# Patient Record
Sex: Male | Born: 1935 | Race: White | Hispanic: No | Smoking: Former smoker
Health system: Southern US, Community
[De-identification: ages and names within clinical notes are randomized; demographics above are authoritative.]

## PROBLEM LIST (undated history)

## (undated) DIAGNOSIS — L0390 Cellulitis, unspecified: Secondary | ICD-10-CM

## (undated) DIAGNOSIS — N429 Disorder of prostate, unspecified: Secondary | ICD-10-CM

## (undated) DIAGNOSIS — I509 Heart failure, unspecified: Secondary | ICD-10-CM

## (undated) DIAGNOSIS — K649 Unspecified hemorrhoids: Secondary | ICD-10-CM

## (undated) DIAGNOSIS — I4891 Unspecified atrial fibrillation: Secondary | ICD-10-CM

## (undated) DIAGNOSIS — I10 Essential (primary) hypertension: Secondary | ICD-10-CM

## (undated) HISTORY — DX: Heart failure, unspecified: I50.9

## (undated) HISTORY — PX: HEMORRHOID SURGERY: SHX153

---

## 2002-05-27 ENCOUNTER — Ambulatory Visit (HOSPITAL_COMMUNITY): Admission: RE | Admit: 2002-05-27 | Discharge: 2002-05-27 | Payer: Self-pay | Admitting: Gastroenterology

## 2002-07-10 ENCOUNTER — Encounter: Payer: Self-pay | Admitting: General Surgery

## 2002-07-15 ENCOUNTER — Ambulatory Visit (HOSPITAL_COMMUNITY): Admission: RE | Admit: 2002-07-15 | Discharge: 2002-07-15 | Payer: Self-pay | Admitting: General Surgery

## 2002-08-27 ENCOUNTER — Ambulatory Visit (HOSPITAL_COMMUNITY): Admission: RE | Admit: 2002-08-27 | Discharge: 2002-08-27 | Payer: Self-pay | Admitting: General Surgery

## 2004-04-18 ENCOUNTER — Encounter: Admission: RE | Admit: 2004-04-18 | Discharge: 2004-04-18 | Payer: Self-pay | Admitting: Internal Medicine

## 2011-08-15 ENCOUNTER — Encounter (INDEPENDENT_AMBULATORY_CARE_PROVIDER_SITE_OTHER): Payer: MEDICARE | Admitting: Ophthalmology

## 2011-08-15 DIAGNOSIS — H43819 Vitreous degeneration, unspecified eye: Secondary | ICD-10-CM

## 2011-08-15 DIAGNOSIS — H348192 Central retinal vein occlusion, unspecified eye, stable: Secondary | ICD-10-CM

## 2011-08-15 DIAGNOSIS — H35039 Hypertensive retinopathy, unspecified eye: Secondary | ICD-10-CM

## 2011-09-12 ENCOUNTER — Encounter (INDEPENDENT_AMBULATORY_CARE_PROVIDER_SITE_OTHER): Payer: MEDICARE | Admitting: Ophthalmology

## 2011-09-12 DIAGNOSIS — H348192 Central retinal vein occlusion, unspecified eye, stable: Secondary | ICD-10-CM

## 2011-09-12 DIAGNOSIS — H35039 Hypertensive retinopathy, unspecified eye: Secondary | ICD-10-CM

## 2011-09-12 DIAGNOSIS — H43819 Vitreous degeneration, unspecified eye: Secondary | ICD-10-CM

## 2011-10-10 ENCOUNTER — Encounter (INDEPENDENT_AMBULATORY_CARE_PROVIDER_SITE_OTHER): Payer: MEDICARE | Admitting: Ophthalmology

## 2011-10-10 DIAGNOSIS — H35039 Hypertensive retinopathy, unspecified eye: Secondary | ICD-10-CM

## 2011-10-10 DIAGNOSIS — H348192 Central retinal vein occlusion, unspecified eye, stable: Secondary | ICD-10-CM

## 2011-10-10 DIAGNOSIS — H43819 Vitreous degeneration, unspecified eye: Secondary | ICD-10-CM

## 2011-11-19 ENCOUNTER — Encounter (INDEPENDENT_AMBULATORY_CARE_PROVIDER_SITE_OTHER): Payer: MEDICARE | Admitting: Ophthalmology

## 2011-11-19 DIAGNOSIS — H35379 Puckering of macula, unspecified eye: Secondary | ICD-10-CM

## 2011-11-19 DIAGNOSIS — H348192 Central retinal vein occlusion, unspecified eye, stable: Secondary | ICD-10-CM

## 2011-11-19 DIAGNOSIS — I10 Essential (primary) hypertension: Secondary | ICD-10-CM

## 2011-11-19 DIAGNOSIS — H35039 Hypertensive retinopathy, unspecified eye: Secondary | ICD-10-CM

## 2012-01-04 ENCOUNTER — Encounter (INDEPENDENT_AMBULATORY_CARE_PROVIDER_SITE_OTHER): Payer: MEDICARE | Admitting: Ophthalmology

## 2012-01-04 DIAGNOSIS — I10 Essential (primary) hypertension: Secondary | ICD-10-CM

## 2012-01-04 DIAGNOSIS — H35039 Hypertensive retinopathy, unspecified eye: Secondary | ICD-10-CM

## 2012-01-04 DIAGNOSIS — H43819 Vitreous degeneration, unspecified eye: Secondary | ICD-10-CM

## 2012-01-04 DIAGNOSIS — H348192 Central retinal vein occlusion, unspecified eye, stable: Secondary | ICD-10-CM

## 2012-02-18 ENCOUNTER — Encounter (INDEPENDENT_AMBULATORY_CARE_PROVIDER_SITE_OTHER): Payer: MEDICARE | Admitting: Ophthalmology

## 2012-02-18 DIAGNOSIS — H35039 Hypertensive retinopathy, unspecified eye: Secondary | ICD-10-CM

## 2012-02-18 DIAGNOSIS — I10 Essential (primary) hypertension: Secondary | ICD-10-CM

## 2012-02-18 DIAGNOSIS — H348192 Central retinal vein occlusion, unspecified eye, stable: Secondary | ICD-10-CM

## 2012-02-18 DIAGNOSIS — H43819 Vitreous degeneration, unspecified eye: Secondary | ICD-10-CM

## 2012-03-28 ENCOUNTER — Encounter (INDEPENDENT_AMBULATORY_CARE_PROVIDER_SITE_OTHER): Payer: MEDICARE | Admitting: Ophthalmology

## 2012-03-28 DIAGNOSIS — I10 Essential (primary) hypertension: Secondary | ICD-10-CM

## 2012-03-28 DIAGNOSIS — H43819 Vitreous degeneration, unspecified eye: Secondary | ICD-10-CM

## 2012-03-28 DIAGNOSIS — H348192 Central retinal vein occlusion, unspecified eye, stable: Secondary | ICD-10-CM

## 2012-03-28 DIAGNOSIS — H35039 Hypertensive retinopathy, unspecified eye: Secondary | ICD-10-CM

## 2012-05-07 ENCOUNTER — Encounter (INDEPENDENT_AMBULATORY_CARE_PROVIDER_SITE_OTHER): Payer: MEDICARE | Admitting: Ophthalmology

## 2012-05-07 DIAGNOSIS — H43819 Vitreous degeneration, unspecified eye: Secondary | ICD-10-CM

## 2012-05-07 DIAGNOSIS — H35039 Hypertensive retinopathy, unspecified eye: Secondary | ICD-10-CM

## 2012-05-07 DIAGNOSIS — H348192 Central retinal vein occlusion, unspecified eye, stable: Secondary | ICD-10-CM

## 2012-05-07 DIAGNOSIS — I10 Essential (primary) hypertension: Secondary | ICD-10-CM

## 2012-06-18 ENCOUNTER — Encounter (INDEPENDENT_AMBULATORY_CARE_PROVIDER_SITE_OTHER): Payer: MEDICARE | Admitting: Ophthalmology

## 2012-06-18 DIAGNOSIS — I10 Essential (primary) hypertension: Secondary | ICD-10-CM

## 2012-06-18 DIAGNOSIS — H35039 Hypertensive retinopathy, unspecified eye: Secondary | ICD-10-CM

## 2012-06-18 DIAGNOSIS — H348192 Central retinal vein occlusion, unspecified eye, stable: Secondary | ICD-10-CM

## 2012-06-18 DIAGNOSIS — H43819 Vitreous degeneration, unspecified eye: Secondary | ICD-10-CM

## 2012-07-16 ENCOUNTER — Encounter (INDEPENDENT_AMBULATORY_CARE_PROVIDER_SITE_OTHER): Payer: Medicare Other | Admitting: Ophthalmology

## 2012-07-16 DIAGNOSIS — H43819 Vitreous degeneration, unspecified eye: Secondary | ICD-10-CM

## 2012-07-16 DIAGNOSIS — H35039 Hypertensive retinopathy, unspecified eye: Secondary | ICD-10-CM

## 2012-07-16 DIAGNOSIS — I10 Essential (primary) hypertension: Secondary | ICD-10-CM

## 2012-07-16 DIAGNOSIS — H348192 Central retinal vein occlusion, unspecified eye, stable: Secondary | ICD-10-CM

## 2012-08-13 ENCOUNTER — Encounter (INDEPENDENT_AMBULATORY_CARE_PROVIDER_SITE_OTHER): Payer: Medicare Other | Admitting: Ophthalmology

## 2012-08-21 ENCOUNTER — Encounter (INDEPENDENT_AMBULATORY_CARE_PROVIDER_SITE_OTHER): Payer: Medicare Other | Admitting: Ophthalmology

## 2012-08-21 DIAGNOSIS — I10 Essential (primary) hypertension: Secondary | ICD-10-CM

## 2012-08-21 DIAGNOSIS — H348192 Central retinal vein occlusion, unspecified eye, stable: Secondary | ICD-10-CM

## 2012-08-21 DIAGNOSIS — H43819 Vitreous degeneration, unspecified eye: Secondary | ICD-10-CM

## 2012-08-21 DIAGNOSIS — H35039 Hypertensive retinopathy, unspecified eye: Secondary | ICD-10-CM

## 2012-09-18 ENCOUNTER — Encounter (INDEPENDENT_AMBULATORY_CARE_PROVIDER_SITE_OTHER): Payer: Medicare Other | Admitting: Ophthalmology

## 2012-09-18 DIAGNOSIS — I10 Essential (primary) hypertension: Secondary | ICD-10-CM

## 2012-09-18 DIAGNOSIS — H35039 Hypertensive retinopathy, unspecified eye: Secondary | ICD-10-CM

## 2012-09-18 DIAGNOSIS — H43819 Vitreous degeneration, unspecified eye: Secondary | ICD-10-CM

## 2012-09-18 DIAGNOSIS — H35379 Puckering of macula, unspecified eye: Secondary | ICD-10-CM

## 2012-09-18 DIAGNOSIS — H3530 Unspecified macular degeneration: Secondary | ICD-10-CM

## 2012-09-18 DIAGNOSIS — H348192 Central retinal vein occlusion, unspecified eye, stable: Secondary | ICD-10-CM

## 2012-10-15 ENCOUNTER — Encounter (INDEPENDENT_AMBULATORY_CARE_PROVIDER_SITE_OTHER): Payer: Medicare Other | Admitting: Ophthalmology

## 2012-10-15 DIAGNOSIS — H3530 Unspecified macular degeneration: Secondary | ICD-10-CM

## 2012-10-15 DIAGNOSIS — H43819 Vitreous degeneration, unspecified eye: Secondary | ICD-10-CM

## 2012-10-15 DIAGNOSIS — H35039 Hypertensive retinopathy, unspecified eye: Secondary | ICD-10-CM

## 2012-10-15 DIAGNOSIS — H348192 Central retinal vein occlusion, unspecified eye, stable: Secondary | ICD-10-CM

## 2012-10-15 DIAGNOSIS — I10 Essential (primary) hypertension: Secondary | ICD-10-CM

## 2012-11-21 ENCOUNTER — Encounter (INDEPENDENT_AMBULATORY_CARE_PROVIDER_SITE_OTHER): Payer: Medicare Other | Admitting: Ophthalmology

## 2012-11-21 DIAGNOSIS — H43819 Vitreous degeneration, unspecified eye: Secondary | ICD-10-CM

## 2012-11-21 DIAGNOSIS — H348192 Central retinal vein occlusion, unspecified eye, stable: Secondary | ICD-10-CM

## 2012-11-21 DIAGNOSIS — H35039 Hypertensive retinopathy, unspecified eye: Secondary | ICD-10-CM

## 2012-11-21 DIAGNOSIS — I10 Essential (primary) hypertension: Secondary | ICD-10-CM

## 2013-01-02 ENCOUNTER — Encounter (INDEPENDENT_AMBULATORY_CARE_PROVIDER_SITE_OTHER): Payer: Medicare Other | Admitting: Ophthalmology

## 2013-01-02 DIAGNOSIS — I10 Essential (primary) hypertension: Secondary | ICD-10-CM

## 2013-01-02 DIAGNOSIS — H35039 Hypertensive retinopathy, unspecified eye: Secondary | ICD-10-CM

## 2013-01-02 DIAGNOSIS — H348192 Central retinal vein occlusion, unspecified eye, stable: Secondary | ICD-10-CM

## 2013-01-02 DIAGNOSIS — H43819 Vitreous degeneration, unspecified eye: Secondary | ICD-10-CM

## 2013-02-20 ENCOUNTER — Encounter (INDEPENDENT_AMBULATORY_CARE_PROVIDER_SITE_OTHER): Payer: Medicare Other | Admitting: Ophthalmology

## 2013-02-20 DIAGNOSIS — H348192 Central retinal vein occlusion, unspecified eye, stable: Secondary | ICD-10-CM

## 2013-02-20 DIAGNOSIS — H35039 Hypertensive retinopathy, unspecified eye: Secondary | ICD-10-CM

## 2013-02-20 DIAGNOSIS — H15849 Scleral ectasia, unspecified eye: Secondary | ICD-10-CM

## 2013-02-20 DIAGNOSIS — I10 Essential (primary) hypertension: Secondary | ICD-10-CM

## 2013-04-09 ENCOUNTER — Encounter (INDEPENDENT_AMBULATORY_CARE_PROVIDER_SITE_OTHER): Payer: Medicare Other | Admitting: Ophthalmology

## 2013-04-09 DIAGNOSIS — H35039 Hypertensive retinopathy, unspecified eye: Secondary | ICD-10-CM

## 2013-04-09 DIAGNOSIS — H43819 Vitreous degeneration, unspecified eye: Secondary | ICD-10-CM

## 2013-04-09 DIAGNOSIS — H35379 Puckering of macula, unspecified eye: Secondary | ICD-10-CM

## 2013-04-09 DIAGNOSIS — H348192 Central retinal vein occlusion, unspecified eye, stable: Secondary | ICD-10-CM

## 2013-04-09 DIAGNOSIS — I10 Essential (primary) hypertension: Secondary | ICD-10-CM

## 2013-05-26 ENCOUNTER — Encounter (INDEPENDENT_AMBULATORY_CARE_PROVIDER_SITE_OTHER): Payer: Medicare Other | Admitting: Ophthalmology

## 2013-05-26 DIAGNOSIS — H35039 Hypertensive retinopathy, unspecified eye: Secondary | ICD-10-CM

## 2013-05-26 DIAGNOSIS — I10 Essential (primary) hypertension: Secondary | ICD-10-CM

## 2013-05-26 DIAGNOSIS — H43819 Vitreous degeneration, unspecified eye: Secondary | ICD-10-CM

## 2013-05-26 DIAGNOSIS — H348192 Central retinal vein occlusion, unspecified eye, stable: Secondary | ICD-10-CM

## 2013-06-30 ENCOUNTER — Ambulatory Visit (HOSPITAL_COMMUNITY)
Admission: RE | Admit: 2013-06-30 | Discharge: 2013-06-30 | Disposition: A | Discharge status: Home / Self Care | Payer: Medicare Other | Source: Ambulatory Visit | Attending: Family Medicine | Admitting: Family Medicine

## 2013-06-30 ENCOUNTER — Other Ambulatory Visit (HOSPITAL_COMMUNITY): Payer: Self-pay | Admitting: Family Medicine

## 2013-06-30 DIAGNOSIS — R937 Abnormal findings on diagnostic imaging of other parts of musculoskeletal system: Secondary | ICD-10-CM | POA: Insufficient documentation

## 2013-06-30 DIAGNOSIS — M545 Low back pain, unspecified: Secondary | ICD-10-CM | POA: Insufficient documentation

## 2013-06-30 DIAGNOSIS — IMO0002 Reserved for concepts with insufficient information to code with codable children: Secondary | ICD-10-CM

## 2013-07-09 ENCOUNTER — Encounter (INDEPENDENT_AMBULATORY_CARE_PROVIDER_SITE_OTHER): Payer: Medicare Other | Admitting: Ophthalmology

## 2013-07-09 DIAGNOSIS — H35039 Hypertensive retinopathy, unspecified eye: Secondary | ICD-10-CM

## 2013-07-09 DIAGNOSIS — H348192 Central retinal vein occlusion, unspecified eye, stable: Secondary | ICD-10-CM

## 2013-07-09 DIAGNOSIS — H43819 Vitreous degeneration, unspecified eye: Secondary | ICD-10-CM

## 2013-07-09 DIAGNOSIS — H3530 Unspecified macular degeneration: Secondary | ICD-10-CM

## 2013-07-09 DIAGNOSIS — I10 Essential (primary) hypertension: Secondary | ICD-10-CM

## 2013-08-06 ENCOUNTER — Encounter (INDEPENDENT_AMBULATORY_CARE_PROVIDER_SITE_OTHER): Payer: Medicare Other | Admitting: Ophthalmology

## 2013-08-06 DIAGNOSIS — H348192 Central retinal vein occlusion, unspecified eye, stable: Secondary | ICD-10-CM

## 2013-08-06 DIAGNOSIS — I10 Essential (primary) hypertension: Secondary | ICD-10-CM

## 2013-08-06 DIAGNOSIS — H35039 Hypertensive retinopathy, unspecified eye: Secondary | ICD-10-CM

## 2013-08-06 DIAGNOSIS — H43819 Vitreous degeneration, unspecified eye: Secondary | ICD-10-CM

## 2013-09-09 ENCOUNTER — Encounter (INDEPENDENT_AMBULATORY_CARE_PROVIDER_SITE_OTHER): Payer: Medicare Other | Admitting: Ophthalmology

## 2013-09-09 DIAGNOSIS — I10 Essential (primary) hypertension: Secondary | ICD-10-CM

## 2013-09-09 DIAGNOSIS — H35039 Hypertensive retinopathy, unspecified eye: Secondary | ICD-10-CM

## 2013-09-09 DIAGNOSIS — H43819 Vitreous degeneration, unspecified eye: Secondary | ICD-10-CM

## 2013-09-09 DIAGNOSIS — H348192 Central retinal vein occlusion, unspecified eye, stable: Secondary | ICD-10-CM

## 2013-10-14 ENCOUNTER — Encounter (INDEPENDENT_AMBULATORY_CARE_PROVIDER_SITE_OTHER): Payer: Medicare Other | Admitting: Ophthalmology

## 2013-10-14 DIAGNOSIS — I10 Essential (primary) hypertension: Secondary | ICD-10-CM

## 2013-10-14 DIAGNOSIS — H35039 Hypertensive retinopathy, unspecified eye: Secondary | ICD-10-CM

## 2013-10-14 DIAGNOSIS — H348192 Central retinal vein occlusion, unspecified eye, stable: Secondary | ICD-10-CM

## 2013-10-14 DIAGNOSIS — H43819 Vitreous degeneration, unspecified eye: Secondary | ICD-10-CM

## 2013-11-20 ENCOUNTER — Encounter (INDEPENDENT_AMBULATORY_CARE_PROVIDER_SITE_OTHER): Payer: Medicare Other | Admitting: Ophthalmology

## 2013-11-20 DIAGNOSIS — H348192 Central retinal vein occlusion, unspecified eye, stable: Secondary | ICD-10-CM

## 2013-11-20 DIAGNOSIS — I10 Essential (primary) hypertension: Secondary | ICD-10-CM

## 2013-11-20 DIAGNOSIS — H35039 Hypertensive retinopathy, unspecified eye: Secondary | ICD-10-CM

## 2013-11-20 DIAGNOSIS — H43819 Vitreous degeneration, unspecified eye: Secondary | ICD-10-CM

## 2013-12-29 ENCOUNTER — Encounter (INDEPENDENT_AMBULATORY_CARE_PROVIDER_SITE_OTHER): Payer: Medicare Other | Admitting: Ophthalmology

## 2013-12-29 DIAGNOSIS — H35379 Puckering of macula, unspecified eye: Secondary | ICD-10-CM

## 2013-12-29 DIAGNOSIS — H35039 Hypertensive retinopathy, unspecified eye: Secondary | ICD-10-CM

## 2013-12-29 DIAGNOSIS — H3530 Unspecified macular degeneration: Secondary | ICD-10-CM

## 2013-12-29 DIAGNOSIS — H43819 Vitreous degeneration, unspecified eye: Secondary | ICD-10-CM

## 2013-12-29 DIAGNOSIS — H348192 Central retinal vein occlusion, unspecified eye, stable: Secondary | ICD-10-CM

## 2013-12-29 DIAGNOSIS — I10 Essential (primary) hypertension: Secondary | ICD-10-CM

## 2014-02-10 ENCOUNTER — Encounter (INDEPENDENT_AMBULATORY_CARE_PROVIDER_SITE_OTHER): Payer: Medicare Other | Admitting: Ophthalmology

## 2014-02-10 DIAGNOSIS — H3530 Unspecified macular degeneration: Secondary | ICD-10-CM

## 2014-02-10 DIAGNOSIS — H43819 Vitreous degeneration, unspecified eye: Secondary | ICD-10-CM

## 2014-02-10 DIAGNOSIS — I10 Essential (primary) hypertension: Secondary | ICD-10-CM

## 2014-02-10 DIAGNOSIS — H35039 Hypertensive retinopathy, unspecified eye: Secondary | ICD-10-CM

## 2014-02-10 DIAGNOSIS — H348192 Central retinal vein occlusion, unspecified eye, stable: Secondary | ICD-10-CM

## 2014-03-24 ENCOUNTER — Encounter (INDEPENDENT_AMBULATORY_CARE_PROVIDER_SITE_OTHER): Payer: Medicare Other | Admitting: Ophthalmology

## 2014-03-24 DIAGNOSIS — H3530 Unspecified macular degeneration: Secondary | ICD-10-CM

## 2014-03-24 DIAGNOSIS — H43819 Vitreous degeneration, unspecified eye: Secondary | ICD-10-CM

## 2014-03-24 DIAGNOSIS — I10 Essential (primary) hypertension: Secondary | ICD-10-CM

## 2014-03-24 DIAGNOSIS — H35039 Hypertensive retinopathy, unspecified eye: Secondary | ICD-10-CM

## 2014-03-24 DIAGNOSIS — H348192 Central retinal vein occlusion, unspecified eye, stable: Secondary | ICD-10-CM

## 2014-05-07 ENCOUNTER — Encounter (INDEPENDENT_AMBULATORY_CARE_PROVIDER_SITE_OTHER): Payer: Medicare Other | Admitting: Ophthalmology

## 2014-05-07 DIAGNOSIS — I10 Essential (primary) hypertension: Secondary | ICD-10-CM

## 2014-05-07 DIAGNOSIS — H35039 Hypertensive retinopathy, unspecified eye: Secondary | ICD-10-CM

## 2014-05-07 DIAGNOSIS — H348192 Central retinal vein occlusion, unspecified eye, stable: Secondary | ICD-10-CM

## 2014-05-07 DIAGNOSIS — H3530 Unspecified macular degeneration: Secondary | ICD-10-CM

## 2014-05-07 DIAGNOSIS — H43819 Vitreous degeneration, unspecified eye: Secondary | ICD-10-CM

## 2014-06-17 ENCOUNTER — Encounter (INDEPENDENT_AMBULATORY_CARE_PROVIDER_SITE_OTHER): Payer: Medicare Other | Admitting: Ophthalmology

## 2014-06-21 ENCOUNTER — Encounter (INDEPENDENT_AMBULATORY_CARE_PROVIDER_SITE_OTHER): Payer: Medicare Other | Admitting: Ophthalmology

## 2014-06-21 DIAGNOSIS — H35039 Hypertensive retinopathy, unspecified eye: Secondary | ICD-10-CM

## 2014-06-21 DIAGNOSIS — I10 Essential (primary) hypertension: Secondary | ICD-10-CM

## 2014-06-21 DIAGNOSIS — H348192 Central retinal vein occlusion, unspecified eye, stable: Secondary | ICD-10-CM

## 2014-06-21 DIAGNOSIS — H43819 Vitreous degeneration, unspecified eye: Secondary | ICD-10-CM

## 2014-06-21 DIAGNOSIS — H3530 Unspecified macular degeneration: Secondary | ICD-10-CM

## 2014-08-04 ENCOUNTER — Encounter (INDEPENDENT_AMBULATORY_CARE_PROVIDER_SITE_OTHER): Payer: Medicare Other | Admitting: Ophthalmology

## 2014-08-04 DIAGNOSIS — H35039 Hypertensive retinopathy, unspecified eye: Secondary | ICD-10-CM

## 2014-08-04 DIAGNOSIS — H348192 Central retinal vein occlusion, unspecified eye, stable: Secondary | ICD-10-CM

## 2014-08-04 DIAGNOSIS — H43819 Vitreous degeneration, unspecified eye: Secondary | ICD-10-CM

## 2014-08-04 DIAGNOSIS — I10 Essential (primary) hypertension: Secondary | ICD-10-CM

## 2014-08-04 DIAGNOSIS — H3530 Unspecified macular degeneration: Secondary | ICD-10-CM

## 2014-09-16 ENCOUNTER — Encounter (INDEPENDENT_AMBULATORY_CARE_PROVIDER_SITE_OTHER): Payer: Medicare Other | Admitting: Ophthalmology

## 2014-09-16 DIAGNOSIS — H3530 Unspecified macular degeneration: Secondary | ICD-10-CM

## 2014-09-16 DIAGNOSIS — H43819 Vitreous degeneration, unspecified eye: Secondary | ICD-10-CM

## 2014-09-16 DIAGNOSIS — H348192 Central retinal vein occlusion, unspecified eye, stable: Secondary | ICD-10-CM

## 2014-09-16 DIAGNOSIS — H35379 Puckering of macula, unspecified eye: Secondary | ICD-10-CM

## 2014-09-16 DIAGNOSIS — I10 Essential (primary) hypertension: Secondary | ICD-10-CM

## 2014-09-16 DIAGNOSIS — H35039 Hypertensive retinopathy, unspecified eye: Secondary | ICD-10-CM

## 2014-10-28 ENCOUNTER — Encounter (INDEPENDENT_AMBULATORY_CARE_PROVIDER_SITE_OTHER): Payer: Medicare Other | Admitting: Ophthalmology

## 2014-10-28 DIAGNOSIS — I10 Essential (primary) hypertension: Secondary | ICD-10-CM

## 2014-10-28 DIAGNOSIS — H3531 Nonexudative age-related macular degeneration: Secondary | ICD-10-CM

## 2014-10-28 DIAGNOSIS — H35033 Hypertensive retinopathy, bilateral: Secondary | ICD-10-CM

## 2014-10-28 DIAGNOSIS — H34811 Central retinal vein occlusion, right eye: Secondary | ICD-10-CM

## 2014-10-28 DIAGNOSIS — H43813 Vitreous degeneration, bilateral: Secondary | ICD-10-CM

## 2014-12-02 ENCOUNTER — Encounter (INDEPENDENT_AMBULATORY_CARE_PROVIDER_SITE_OTHER): Payer: Medicare Other | Admitting: Ophthalmology

## 2014-12-02 DIAGNOSIS — H3531 Nonexudative age-related macular degeneration: Secondary | ICD-10-CM

## 2014-12-02 DIAGNOSIS — I10 Essential (primary) hypertension: Secondary | ICD-10-CM

## 2014-12-02 DIAGNOSIS — H43813 Vitreous degeneration, bilateral: Secondary | ICD-10-CM

## 2014-12-02 DIAGNOSIS — H35033 Hypertensive retinopathy, bilateral: Secondary | ICD-10-CM

## 2014-12-02 DIAGNOSIS — H34811 Central retinal vein occlusion, right eye: Secondary | ICD-10-CM

## 2015-01-13 ENCOUNTER — Encounter (INDEPENDENT_AMBULATORY_CARE_PROVIDER_SITE_OTHER): Payer: Medicare Other | Admitting: Ophthalmology

## 2015-01-13 DIAGNOSIS — H34811 Central retinal vein occlusion, right eye: Secondary | ICD-10-CM | POA: Diagnosis not present

## 2015-01-13 DIAGNOSIS — H3531 Nonexudative age-related macular degeneration: Secondary | ICD-10-CM

## 2015-01-13 DIAGNOSIS — H35033 Hypertensive retinopathy, bilateral: Secondary | ICD-10-CM | POA: Diagnosis not present

## 2015-01-13 DIAGNOSIS — I10 Essential (primary) hypertension: Secondary | ICD-10-CM

## 2015-01-13 DIAGNOSIS — H35371 Puckering of macula, right eye: Secondary | ICD-10-CM

## 2015-02-24 ENCOUNTER — Encounter (INDEPENDENT_AMBULATORY_CARE_PROVIDER_SITE_OTHER): Payer: Medicare Other | Admitting: Ophthalmology

## 2015-03-03 ENCOUNTER — Encounter (INDEPENDENT_AMBULATORY_CARE_PROVIDER_SITE_OTHER): Payer: Medicare Other | Admitting: Ophthalmology

## 2015-03-03 DIAGNOSIS — H35371 Puckering of macula, right eye: Secondary | ICD-10-CM | POA: Diagnosis not present

## 2015-03-03 DIAGNOSIS — H3531 Nonexudative age-related macular degeneration: Secondary | ICD-10-CM | POA: Diagnosis not present

## 2015-03-03 DIAGNOSIS — H34811 Central retinal vein occlusion, right eye: Secondary | ICD-10-CM

## 2015-03-03 DIAGNOSIS — H43813 Vitreous degeneration, bilateral: Secondary | ICD-10-CM | POA: Diagnosis not present

## 2015-03-03 DIAGNOSIS — I10 Essential (primary) hypertension: Secondary | ICD-10-CM | POA: Diagnosis not present

## 2015-03-03 DIAGNOSIS — H35033 Hypertensive retinopathy, bilateral: Secondary | ICD-10-CM | POA: Diagnosis not present

## 2015-04-14 ENCOUNTER — Encounter (INDEPENDENT_AMBULATORY_CARE_PROVIDER_SITE_OTHER): Payer: Medicare Other | Admitting: Ophthalmology

## 2015-04-14 DIAGNOSIS — H3531 Nonexudative age-related macular degeneration: Secondary | ICD-10-CM

## 2015-04-14 DIAGNOSIS — H34811 Central retinal vein occlusion, right eye: Secondary | ICD-10-CM | POA: Diagnosis not present

## 2015-04-14 DIAGNOSIS — H43813 Vitreous degeneration, bilateral: Secondary | ICD-10-CM | POA: Diagnosis not present

## 2015-04-14 DIAGNOSIS — I10 Essential (primary) hypertension: Secondary | ICD-10-CM | POA: Diagnosis not present

## 2015-04-14 DIAGNOSIS — H35033 Hypertensive retinopathy, bilateral: Secondary | ICD-10-CM

## 2015-05-26 ENCOUNTER — Encounter (INDEPENDENT_AMBULATORY_CARE_PROVIDER_SITE_OTHER): Payer: Medicare Other | Admitting: Ophthalmology

## 2015-05-26 DIAGNOSIS — H35033 Hypertensive retinopathy, bilateral: Secondary | ICD-10-CM | POA: Diagnosis not present

## 2015-05-26 DIAGNOSIS — I10 Essential (primary) hypertension: Secondary | ICD-10-CM

## 2015-05-26 DIAGNOSIS — H43813 Vitreous degeneration, bilateral: Secondary | ICD-10-CM | POA: Diagnosis not present

## 2015-05-26 DIAGNOSIS — H3531 Nonexudative age-related macular degeneration: Secondary | ICD-10-CM | POA: Diagnosis not present

## 2015-05-26 DIAGNOSIS — H35371 Puckering of macula, right eye: Secondary | ICD-10-CM | POA: Diagnosis not present

## 2015-05-26 DIAGNOSIS — H34811 Central retinal vein occlusion, right eye: Secondary | ICD-10-CM | POA: Diagnosis not present

## 2015-07-07 ENCOUNTER — Encounter (INDEPENDENT_AMBULATORY_CARE_PROVIDER_SITE_OTHER): Payer: Medicare Other | Admitting: Ophthalmology

## 2015-07-07 DIAGNOSIS — H35371 Puckering of macula, right eye: Secondary | ICD-10-CM | POA: Diagnosis not present

## 2015-07-07 DIAGNOSIS — I10 Essential (primary) hypertension: Secondary | ICD-10-CM

## 2015-07-07 DIAGNOSIS — H43813 Vitreous degeneration, bilateral: Secondary | ICD-10-CM

## 2015-07-07 DIAGNOSIS — H3531 Nonexudative age-related macular degeneration: Secondary | ICD-10-CM

## 2015-07-07 DIAGNOSIS — H34811 Central retinal vein occlusion, right eye: Secondary | ICD-10-CM

## 2015-07-07 DIAGNOSIS — H35033 Hypertensive retinopathy, bilateral: Secondary | ICD-10-CM | POA: Diagnosis not present

## 2015-08-18 ENCOUNTER — Encounter (INDEPENDENT_AMBULATORY_CARE_PROVIDER_SITE_OTHER): Payer: Medicare Other | Admitting: Ophthalmology

## 2015-08-18 DIAGNOSIS — H34811 Central retinal vein occlusion, right eye: Secondary | ICD-10-CM

## 2015-08-18 DIAGNOSIS — H35033 Hypertensive retinopathy, bilateral: Secondary | ICD-10-CM | POA: Diagnosis not present

## 2015-08-18 DIAGNOSIS — I10 Essential (primary) hypertension: Secondary | ICD-10-CM | POA: Diagnosis not present

## 2015-08-18 DIAGNOSIS — H43813 Vitreous degeneration, bilateral: Secondary | ICD-10-CM | POA: Diagnosis not present

## 2015-08-18 DIAGNOSIS — H3531 Nonexudative age-related macular degeneration: Secondary | ICD-10-CM

## 2015-08-18 DIAGNOSIS — H35371 Puckering of macula, right eye: Secondary | ICD-10-CM

## 2015-09-23 ENCOUNTER — Encounter (INDEPENDENT_AMBULATORY_CARE_PROVIDER_SITE_OTHER): Payer: Medicare Other | Admitting: Ophthalmology

## 2015-09-23 DIAGNOSIS — H3531 Nonexudative age-related macular degeneration: Secondary | ICD-10-CM | POA: Diagnosis not present

## 2015-09-23 DIAGNOSIS — H43813 Vitreous degeneration, bilateral: Secondary | ICD-10-CM

## 2015-09-23 DIAGNOSIS — H34811 Central retinal vein occlusion, right eye: Secondary | ICD-10-CM

## 2015-09-23 DIAGNOSIS — H35371 Puckering of macula, right eye: Secondary | ICD-10-CM

## 2015-09-23 DIAGNOSIS — I10 Essential (primary) hypertension: Secondary | ICD-10-CM | POA: Diagnosis not present

## 2015-09-23 DIAGNOSIS — H35033 Hypertensive retinopathy, bilateral: Secondary | ICD-10-CM | POA: Diagnosis not present

## 2015-10-28 ENCOUNTER — Encounter (INDEPENDENT_AMBULATORY_CARE_PROVIDER_SITE_OTHER): Payer: Medicare Other | Admitting: Ophthalmology

## 2015-10-28 DIAGNOSIS — H353131 Nonexudative age-related macular degeneration, bilateral, early dry stage: Secondary | ICD-10-CM | POA: Diagnosis not present

## 2015-10-28 DIAGNOSIS — H34811 Central retinal vein occlusion, right eye, with macular edema: Secondary | ICD-10-CM | POA: Diagnosis not present

## 2015-10-28 DIAGNOSIS — H43813 Vitreous degeneration, bilateral: Secondary | ICD-10-CM

## 2015-10-28 DIAGNOSIS — I10 Essential (primary) hypertension: Secondary | ICD-10-CM | POA: Diagnosis not present

## 2015-10-28 DIAGNOSIS — H35373 Puckering of macula, bilateral: Secondary | ICD-10-CM | POA: Diagnosis not present

## 2015-10-28 DIAGNOSIS — H35033 Hypertensive retinopathy, bilateral: Secondary | ICD-10-CM

## 2015-12-02 ENCOUNTER — Encounter (INDEPENDENT_AMBULATORY_CARE_PROVIDER_SITE_OTHER): Payer: Medicare Other | Admitting: Ophthalmology

## 2015-12-02 DIAGNOSIS — H35033 Hypertensive retinopathy, bilateral: Secondary | ICD-10-CM | POA: Diagnosis not present

## 2015-12-02 DIAGNOSIS — H35372 Puckering of macula, left eye: Secondary | ICD-10-CM

## 2015-12-02 DIAGNOSIS — H348111 Central retinal vein occlusion, right eye, with retinal neovascularization: Secondary | ICD-10-CM

## 2015-12-02 DIAGNOSIS — I10 Essential (primary) hypertension: Secondary | ICD-10-CM | POA: Diagnosis not present

## 2015-12-02 DIAGNOSIS — H353111 Nonexudative age-related macular degeneration, right eye, early dry stage: Secondary | ICD-10-CM

## 2015-12-02 DIAGNOSIS — H353122 Nonexudative age-related macular degeneration, left eye, intermediate dry stage: Secondary | ICD-10-CM | POA: Diagnosis not present

## 2016-01-13 ENCOUNTER — Encounter (INDEPENDENT_AMBULATORY_CARE_PROVIDER_SITE_OTHER): Payer: Medicare Other | Admitting: Ophthalmology

## 2016-01-13 DIAGNOSIS — H35372 Puckering of macula, left eye: Secondary | ICD-10-CM | POA: Diagnosis not present

## 2016-01-13 DIAGNOSIS — I10 Essential (primary) hypertension: Secondary | ICD-10-CM | POA: Diagnosis not present

## 2016-01-13 DIAGNOSIS — H35033 Hypertensive retinopathy, bilateral: Secondary | ICD-10-CM

## 2016-01-13 DIAGNOSIS — H353122 Nonexudative age-related macular degeneration, left eye, intermediate dry stage: Secondary | ICD-10-CM

## 2016-01-13 DIAGNOSIS — H34811 Central retinal vein occlusion, right eye, with macular edema: Secondary | ICD-10-CM

## 2016-01-13 DIAGNOSIS — H353111 Nonexudative age-related macular degeneration, right eye, early dry stage: Secondary | ICD-10-CM | POA: Diagnosis not present

## 2016-02-17 ENCOUNTER — Encounter (INDEPENDENT_AMBULATORY_CARE_PROVIDER_SITE_OTHER): Payer: Medicare Other | Admitting: Ophthalmology

## 2016-02-17 DIAGNOSIS — H35033 Hypertensive retinopathy, bilateral: Secondary | ICD-10-CM | POA: Diagnosis not present

## 2016-02-17 DIAGNOSIS — H353132 Nonexudative age-related macular degeneration, bilateral, intermediate dry stage: Secondary | ICD-10-CM

## 2016-02-17 DIAGNOSIS — H43813 Vitreous degeneration, bilateral: Secondary | ICD-10-CM

## 2016-02-17 DIAGNOSIS — I10 Essential (primary) hypertension: Secondary | ICD-10-CM | POA: Diagnosis not present

## 2016-02-17 DIAGNOSIS — H34811 Central retinal vein occlusion, right eye, with macular edema: Secondary | ICD-10-CM | POA: Diagnosis not present

## 2016-02-17 DIAGNOSIS — H35372 Puckering of macula, left eye: Secondary | ICD-10-CM

## 2016-03-26 ENCOUNTER — Encounter (INDEPENDENT_AMBULATORY_CARE_PROVIDER_SITE_OTHER): Payer: Medicare Other | Admitting: Ophthalmology

## 2016-03-26 DIAGNOSIS — H43813 Vitreous degeneration, bilateral: Secondary | ICD-10-CM

## 2016-03-26 DIAGNOSIS — I10 Essential (primary) hypertension: Secondary | ICD-10-CM

## 2016-03-26 DIAGNOSIS — H35033 Hypertensive retinopathy, bilateral: Secondary | ICD-10-CM | POA: Diagnosis not present

## 2016-03-26 DIAGNOSIS — H35371 Puckering of macula, right eye: Secondary | ICD-10-CM

## 2016-03-26 DIAGNOSIS — H353131 Nonexudative age-related macular degeneration, bilateral, early dry stage: Secondary | ICD-10-CM | POA: Diagnosis not present

## 2016-03-26 DIAGNOSIS — H34811 Central retinal vein occlusion, right eye, with macular edema: Secondary | ICD-10-CM | POA: Diagnosis not present

## 2016-03-30 ENCOUNTER — Encounter (INDEPENDENT_AMBULATORY_CARE_PROVIDER_SITE_OTHER): Payer: Medicare Other | Admitting: Ophthalmology

## 2016-04-30 ENCOUNTER — Encounter (INDEPENDENT_AMBULATORY_CARE_PROVIDER_SITE_OTHER): Payer: Medicare Other | Admitting: Ophthalmology

## 2016-04-30 DIAGNOSIS — H353132 Nonexudative age-related macular degeneration, bilateral, intermediate dry stage: Secondary | ICD-10-CM | POA: Diagnosis not present

## 2016-04-30 DIAGNOSIS — I10 Essential (primary) hypertension: Secondary | ICD-10-CM | POA: Diagnosis not present

## 2016-04-30 DIAGNOSIS — H43813 Vitreous degeneration, bilateral: Secondary | ICD-10-CM

## 2016-04-30 DIAGNOSIS — H35033 Hypertensive retinopathy, bilateral: Secondary | ICD-10-CM

## 2016-04-30 DIAGNOSIS — H34811 Central retinal vein occlusion, right eye, with macular edema: Secondary | ICD-10-CM | POA: Diagnosis not present

## 2016-06-01 ENCOUNTER — Encounter (INDEPENDENT_AMBULATORY_CARE_PROVIDER_SITE_OTHER): Payer: Medicare Other | Admitting: Ophthalmology

## 2016-06-01 DIAGNOSIS — H35033 Hypertensive retinopathy, bilateral: Secondary | ICD-10-CM | POA: Diagnosis not present

## 2016-06-01 DIAGNOSIS — H34811 Central retinal vein occlusion, right eye, with macular edema: Secondary | ICD-10-CM

## 2016-06-01 DIAGNOSIS — H35371 Puckering of macula, right eye: Secondary | ICD-10-CM

## 2016-06-01 DIAGNOSIS — I10 Essential (primary) hypertension: Secondary | ICD-10-CM

## 2016-06-01 DIAGNOSIS — H43813 Vitreous degeneration, bilateral: Secondary | ICD-10-CM | POA: Diagnosis not present

## 2016-06-01 DIAGNOSIS — H353132 Nonexudative age-related macular degeneration, bilateral, intermediate dry stage: Secondary | ICD-10-CM | POA: Diagnosis not present

## 2016-07-06 ENCOUNTER — Encounter (INDEPENDENT_AMBULATORY_CARE_PROVIDER_SITE_OTHER): Payer: Medicare Other | Admitting: Ophthalmology

## 2016-07-06 DIAGNOSIS — H353121 Nonexudative age-related macular degeneration, left eye, early dry stage: Secondary | ICD-10-CM | POA: Diagnosis not present

## 2016-07-06 DIAGNOSIS — I10 Essential (primary) hypertension: Secondary | ICD-10-CM | POA: Diagnosis not present

## 2016-07-06 DIAGNOSIS — H34811 Central retinal vein occlusion, right eye, with macular edema: Secondary | ICD-10-CM | POA: Diagnosis not present

## 2016-07-06 DIAGNOSIS — H35033 Hypertensive retinopathy, bilateral: Secondary | ICD-10-CM

## 2016-07-06 DIAGNOSIS — H43813 Vitreous degeneration, bilateral: Secondary | ICD-10-CM | POA: Diagnosis not present

## 2016-07-16 ENCOUNTER — Encounter (INDEPENDENT_AMBULATORY_CARE_PROVIDER_SITE_OTHER): Payer: Medicare Other | Admitting: Ophthalmology

## 2016-08-17 ENCOUNTER — Encounter (INDEPENDENT_AMBULATORY_CARE_PROVIDER_SITE_OTHER): Payer: Medicare Other | Admitting: Ophthalmology

## 2016-08-20 ENCOUNTER — Encounter (INDEPENDENT_AMBULATORY_CARE_PROVIDER_SITE_OTHER): Payer: Medicare Other | Admitting: Ophthalmology

## 2016-08-20 DIAGNOSIS — H353111 Nonexudative age-related macular degeneration, right eye, early dry stage: Secondary | ICD-10-CM

## 2016-08-20 DIAGNOSIS — H35033 Hypertensive retinopathy, bilateral: Secondary | ICD-10-CM | POA: Diagnosis not present

## 2016-08-20 DIAGNOSIS — H43813 Vitreous degeneration, bilateral: Secondary | ICD-10-CM | POA: Diagnosis not present

## 2016-08-20 DIAGNOSIS — H353122 Nonexudative age-related macular degeneration, left eye, intermediate dry stage: Secondary | ICD-10-CM | POA: Diagnosis not present

## 2016-08-20 DIAGNOSIS — I10 Essential (primary) hypertension: Secondary | ICD-10-CM | POA: Diagnosis not present

## 2016-08-20 DIAGNOSIS — H34811 Central retinal vein occlusion, right eye, with macular edema: Secondary | ICD-10-CM

## 2016-10-01 ENCOUNTER — Encounter (INDEPENDENT_AMBULATORY_CARE_PROVIDER_SITE_OTHER): Payer: Medicare Other | Admitting: Ophthalmology

## 2016-10-01 DIAGNOSIS — I10 Essential (primary) hypertension: Secondary | ICD-10-CM

## 2016-10-01 DIAGNOSIS — H34811 Central retinal vein occlusion, right eye, with macular edema: Secondary | ICD-10-CM

## 2016-10-01 DIAGNOSIS — H35033 Hypertensive retinopathy, bilateral: Secondary | ICD-10-CM | POA: Diagnosis not present

## 2016-10-01 DIAGNOSIS — H35371 Puckering of macula, right eye: Secondary | ICD-10-CM

## 2016-10-01 DIAGNOSIS — H353131 Nonexudative age-related macular degeneration, bilateral, early dry stage: Secondary | ICD-10-CM | POA: Diagnosis not present

## 2016-10-01 DIAGNOSIS — H43813 Vitreous degeneration, bilateral: Secondary | ICD-10-CM

## 2016-11-12 ENCOUNTER — Encounter (INDEPENDENT_AMBULATORY_CARE_PROVIDER_SITE_OTHER): Payer: Medicare Other | Admitting: Ophthalmology

## 2016-11-12 DIAGNOSIS — H34811 Central retinal vein occlusion, right eye, with macular edema: Secondary | ICD-10-CM | POA: Diagnosis not present

## 2016-11-12 DIAGNOSIS — H35033 Hypertensive retinopathy, bilateral: Secondary | ICD-10-CM | POA: Diagnosis not present

## 2016-11-12 DIAGNOSIS — I10 Essential (primary) hypertension: Secondary | ICD-10-CM

## 2016-11-12 DIAGNOSIS — H43813 Vitreous degeneration, bilateral: Secondary | ICD-10-CM

## 2016-11-12 DIAGNOSIS — H353131 Nonexudative age-related macular degeneration, bilateral, early dry stage: Secondary | ICD-10-CM | POA: Diagnosis not present

## 2016-12-17 ENCOUNTER — Encounter (INDEPENDENT_AMBULATORY_CARE_PROVIDER_SITE_OTHER): Payer: Medicare Other | Admitting: Ophthalmology

## 2016-12-17 DIAGNOSIS — H353122 Nonexudative age-related macular degeneration, left eye, intermediate dry stage: Secondary | ICD-10-CM

## 2016-12-17 DIAGNOSIS — H35371 Puckering of macula, right eye: Secondary | ICD-10-CM | POA: Diagnosis not present

## 2016-12-17 DIAGNOSIS — H34811 Central retinal vein occlusion, right eye, with macular edema: Secondary | ICD-10-CM | POA: Diagnosis not present

## 2016-12-17 DIAGNOSIS — H35033 Hypertensive retinopathy, bilateral: Secondary | ICD-10-CM

## 2016-12-17 DIAGNOSIS — I10 Essential (primary) hypertension: Secondary | ICD-10-CM | POA: Diagnosis not present

## 2016-12-17 DIAGNOSIS — H43813 Vitreous degeneration, bilateral: Secondary | ICD-10-CM | POA: Diagnosis not present

## 2017-01-28 ENCOUNTER — Encounter (INDEPENDENT_AMBULATORY_CARE_PROVIDER_SITE_OTHER): Payer: Medicare Other | Admitting: Ophthalmology

## 2017-01-28 DIAGNOSIS — I10 Essential (primary) hypertension: Secondary | ICD-10-CM | POA: Diagnosis not present

## 2017-01-28 DIAGNOSIS — H34811 Central retinal vein occlusion, right eye, with macular edema: Secondary | ICD-10-CM

## 2017-01-28 DIAGNOSIS — H353121 Nonexudative age-related macular degeneration, left eye, early dry stage: Secondary | ICD-10-CM | POA: Diagnosis not present

## 2017-01-28 DIAGNOSIS — H35033 Hypertensive retinopathy, bilateral: Secondary | ICD-10-CM

## 2017-01-28 DIAGNOSIS — H43813 Vitreous degeneration, bilateral: Secondary | ICD-10-CM | POA: Diagnosis not present

## 2017-01-28 DIAGNOSIS — H35371 Puckering of macula, right eye: Secondary | ICD-10-CM | POA: Diagnosis not present

## 2017-03-11 ENCOUNTER — Encounter (INDEPENDENT_AMBULATORY_CARE_PROVIDER_SITE_OTHER): Payer: Medicare Other | Admitting: Ophthalmology

## 2017-03-18 ENCOUNTER — Encounter (INDEPENDENT_AMBULATORY_CARE_PROVIDER_SITE_OTHER): Payer: Medicare Other | Admitting: Ophthalmology

## 2017-03-18 DIAGNOSIS — H35371 Puckering of macula, right eye: Secondary | ICD-10-CM

## 2017-03-18 DIAGNOSIS — H43813 Vitreous degeneration, bilateral: Secondary | ICD-10-CM

## 2017-03-18 DIAGNOSIS — H34811 Central retinal vein occlusion, right eye, with macular edema: Secondary | ICD-10-CM

## 2017-03-18 DIAGNOSIS — H353122 Nonexudative age-related macular degeneration, left eye, intermediate dry stage: Secondary | ICD-10-CM

## 2017-03-18 DIAGNOSIS — I10 Essential (primary) hypertension: Secondary | ICD-10-CM | POA: Diagnosis not present

## 2017-03-18 DIAGNOSIS — H35033 Hypertensive retinopathy, bilateral: Secondary | ICD-10-CM

## 2017-05-06 ENCOUNTER — Encounter (INDEPENDENT_AMBULATORY_CARE_PROVIDER_SITE_OTHER): Payer: Medicare Other | Admitting: Ophthalmology

## 2017-05-23 ENCOUNTER — Encounter (INDEPENDENT_AMBULATORY_CARE_PROVIDER_SITE_OTHER): Payer: Medicare Other | Admitting: Ophthalmology

## 2017-05-23 DIAGNOSIS — I10 Essential (primary) hypertension: Secondary | ICD-10-CM | POA: Diagnosis not present

## 2017-05-23 DIAGNOSIS — H34811 Central retinal vein occlusion, right eye, with macular edema: Secondary | ICD-10-CM

## 2017-05-23 DIAGNOSIS — H353122 Nonexudative age-related macular degeneration, left eye, intermediate dry stage: Secondary | ICD-10-CM | POA: Diagnosis not present

## 2017-05-23 DIAGNOSIS — H43813 Vitreous degeneration, bilateral: Secondary | ICD-10-CM

## 2017-05-23 DIAGNOSIS — H35033 Hypertensive retinopathy, bilateral: Secondary | ICD-10-CM

## 2017-05-23 DIAGNOSIS — H353111 Nonexudative age-related macular degeneration, right eye, early dry stage: Secondary | ICD-10-CM | POA: Diagnosis not present

## 2017-07-18 ENCOUNTER — Encounter (INDEPENDENT_AMBULATORY_CARE_PROVIDER_SITE_OTHER): Payer: Medicare Other | Admitting: Ophthalmology

## 2017-07-18 DIAGNOSIS — H43813 Vitreous degeneration, bilateral: Secondary | ICD-10-CM

## 2017-07-18 DIAGNOSIS — I10 Essential (primary) hypertension: Secondary | ICD-10-CM

## 2017-07-18 DIAGNOSIS — H35033 Hypertensive retinopathy, bilateral: Secondary | ICD-10-CM

## 2017-07-18 DIAGNOSIS — H34811 Central retinal vein occlusion, right eye, with macular edema: Secondary | ICD-10-CM | POA: Diagnosis not present

## 2017-07-18 DIAGNOSIS — H35371 Puckering of macula, right eye: Secondary | ICD-10-CM | POA: Diagnosis not present

## 2017-07-18 DIAGNOSIS — H353122 Nonexudative age-related macular degeneration, left eye, intermediate dry stage: Secondary | ICD-10-CM

## 2017-09-19 ENCOUNTER — Encounter (INDEPENDENT_AMBULATORY_CARE_PROVIDER_SITE_OTHER): Payer: Medicare Other | Admitting: Ophthalmology

## 2017-09-19 DIAGNOSIS — I10 Essential (primary) hypertension: Secondary | ICD-10-CM | POA: Diagnosis not present

## 2017-09-19 DIAGNOSIS — H35033 Hypertensive retinopathy, bilateral: Secondary | ICD-10-CM

## 2017-09-19 DIAGNOSIS — H34811 Central retinal vein occlusion, right eye, with macular edema: Secondary | ICD-10-CM

## 2017-09-19 DIAGNOSIS — H43813 Vitreous degeneration, bilateral: Secondary | ICD-10-CM | POA: Diagnosis not present

## 2017-09-19 DIAGNOSIS — H353132 Nonexudative age-related macular degeneration, bilateral, intermediate dry stage: Secondary | ICD-10-CM

## 2017-11-06 ENCOUNTER — Ambulatory Visit (HOSPITAL_COMMUNITY)
Admission: RE | Admit: 2017-11-06 | Discharge: 2017-11-06 | Disposition: A | Discharge status: Home / Self Care | Payer: Medicare Other | Source: Ambulatory Visit | Attending: Family Medicine | Admitting: Family Medicine

## 2017-11-06 ENCOUNTER — Other Ambulatory Visit (HOSPITAL_COMMUNITY): Payer: Self-pay | Admitting: Family Medicine

## 2017-11-06 DIAGNOSIS — T17908A Unspecified foreign body in respiratory tract, part unspecified causing other injury, initial encounter: Secondary | ICD-10-CM

## 2017-11-06 DIAGNOSIS — J986 Disorders of diaphragm: Secondary | ICD-10-CM | POA: Insufficient documentation

## 2017-11-06 DIAGNOSIS — R918 Other nonspecific abnormal finding of lung field: Secondary | ICD-10-CM | POA: Diagnosis present

## 2017-11-06 DIAGNOSIS — J9811 Atelectasis: Secondary | ICD-10-CM | POA: Diagnosis not present

## 2017-11-25 ENCOUNTER — Encounter (INDEPENDENT_AMBULATORY_CARE_PROVIDER_SITE_OTHER): Payer: Medicare Other | Admitting: Ophthalmology

## 2017-11-27 ENCOUNTER — Encounter (INDEPENDENT_AMBULATORY_CARE_PROVIDER_SITE_OTHER): Payer: Medicare Other | Admitting: Ophthalmology

## 2017-11-27 DIAGNOSIS — H34811 Central retinal vein occlusion, right eye, with macular edema: Secondary | ICD-10-CM

## 2017-11-27 DIAGNOSIS — I10 Essential (primary) hypertension: Secondary | ICD-10-CM | POA: Diagnosis not present

## 2017-11-27 DIAGNOSIS — H43813 Vitreous degeneration, bilateral: Secondary | ICD-10-CM

## 2017-11-27 DIAGNOSIS — H35033 Hypertensive retinopathy, bilateral: Secondary | ICD-10-CM

## 2017-11-27 DIAGNOSIS — H353132 Nonexudative age-related macular degeneration, bilateral, intermediate dry stage: Secondary | ICD-10-CM | POA: Diagnosis not present

## 2018-02-12 ENCOUNTER — Encounter (INDEPENDENT_AMBULATORY_CARE_PROVIDER_SITE_OTHER): Payer: Medicare Other | Admitting: Ophthalmology

## 2018-02-12 DIAGNOSIS — I10 Essential (primary) hypertension: Secondary | ICD-10-CM | POA: Diagnosis not present

## 2018-02-12 DIAGNOSIS — H34811 Central retinal vein occlusion, right eye, with macular edema: Secondary | ICD-10-CM | POA: Diagnosis not present

## 2018-02-12 DIAGNOSIS — H353132 Nonexudative age-related macular degeneration, bilateral, intermediate dry stage: Secondary | ICD-10-CM | POA: Diagnosis not present

## 2018-02-12 DIAGNOSIS — H43813 Vitreous degeneration, bilateral: Secondary | ICD-10-CM | POA: Diagnosis not present

## 2018-02-12 DIAGNOSIS — H35033 Hypertensive retinopathy, bilateral: Secondary | ICD-10-CM | POA: Diagnosis not present

## 2018-05-14 ENCOUNTER — Encounter (INDEPENDENT_AMBULATORY_CARE_PROVIDER_SITE_OTHER): Payer: Medicare Other | Admitting: Ophthalmology

## 2018-05-14 DIAGNOSIS — I10 Essential (primary) hypertension: Secondary | ICD-10-CM | POA: Diagnosis not present

## 2018-05-14 DIAGNOSIS — H34811 Central retinal vein occlusion, right eye, with macular edema: Secondary | ICD-10-CM | POA: Diagnosis not present

## 2018-05-14 DIAGNOSIS — H43813 Vitreous degeneration, bilateral: Secondary | ICD-10-CM

## 2018-05-14 DIAGNOSIS — H35033 Hypertensive retinopathy, bilateral: Secondary | ICD-10-CM

## 2018-05-14 DIAGNOSIS — H353132 Nonexudative age-related macular degeneration, bilateral, intermediate dry stage: Secondary | ICD-10-CM

## 2018-08-20 ENCOUNTER — Encounter (INDEPENDENT_AMBULATORY_CARE_PROVIDER_SITE_OTHER): Payer: Medicare Other | Admitting: Ophthalmology

## 2018-08-20 DIAGNOSIS — H35033 Hypertensive retinopathy, bilateral: Secondary | ICD-10-CM | POA: Diagnosis not present

## 2018-08-20 DIAGNOSIS — H43813 Vitreous degeneration, bilateral: Secondary | ICD-10-CM

## 2018-08-20 DIAGNOSIS — H35371 Puckering of macula, right eye: Secondary | ICD-10-CM

## 2018-08-20 DIAGNOSIS — I10 Essential (primary) hypertension: Secondary | ICD-10-CM

## 2018-08-20 DIAGNOSIS — H34811 Central retinal vein occlusion, right eye, with macular edema: Secondary | ICD-10-CM | POA: Diagnosis not present

## 2018-12-03 ENCOUNTER — Encounter (INDEPENDENT_AMBULATORY_CARE_PROVIDER_SITE_OTHER): Payer: Medicare Other | Admitting: Ophthalmology

## 2018-12-03 DIAGNOSIS — H353122 Nonexudative age-related macular degeneration, left eye, intermediate dry stage: Secondary | ICD-10-CM

## 2018-12-03 DIAGNOSIS — H35033 Hypertensive retinopathy, bilateral: Secondary | ICD-10-CM

## 2018-12-03 DIAGNOSIS — H34811 Central retinal vein occlusion, right eye, with macular edema: Secondary | ICD-10-CM

## 2018-12-03 DIAGNOSIS — H43813 Vitreous degeneration, bilateral: Secondary | ICD-10-CM

## 2018-12-03 DIAGNOSIS — I10 Essential (primary) hypertension: Secondary | ICD-10-CM | POA: Diagnosis not present

## 2019-03-25 ENCOUNTER — Encounter (INDEPENDENT_AMBULATORY_CARE_PROVIDER_SITE_OTHER): Payer: Medicare Other | Admitting: Ophthalmology

## 2019-03-25 ENCOUNTER — Other Ambulatory Visit: Payer: Self-pay

## 2019-03-25 DIAGNOSIS — H34811 Central retinal vein occlusion, right eye, with macular edema: Secondary | ICD-10-CM

## 2019-03-25 DIAGNOSIS — H353112 Nonexudative age-related macular degeneration, right eye, intermediate dry stage: Secondary | ICD-10-CM | POA: Diagnosis not present

## 2019-03-25 DIAGNOSIS — H43813 Vitreous degeneration, bilateral: Secondary | ICD-10-CM | POA: Diagnosis not present

## 2019-03-25 DIAGNOSIS — H35033 Hypertensive retinopathy, bilateral: Secondary | ICD-10-CM | POA: Diagnosis not present

## 2019-03-25 DIAGNOSIS — H35371 Puckering of macula, right eye: Secondary | ICD-10-CM

## 2019-03-25 DIAGNOSIS — I10 Essential (primary) hypertension: Secondary | ICD-10-CM | POA: Diagnosis not present

## 2019-04-21 IMAGING — DX DG CHEST 2V
2 series · 2 of 2 positions shown · non-contrast
Comparison: 04/18/2004 chest CT

CLINICAL DATA: Aspiration

EXAM:
CHEST  2 VIEW

[chest pa]
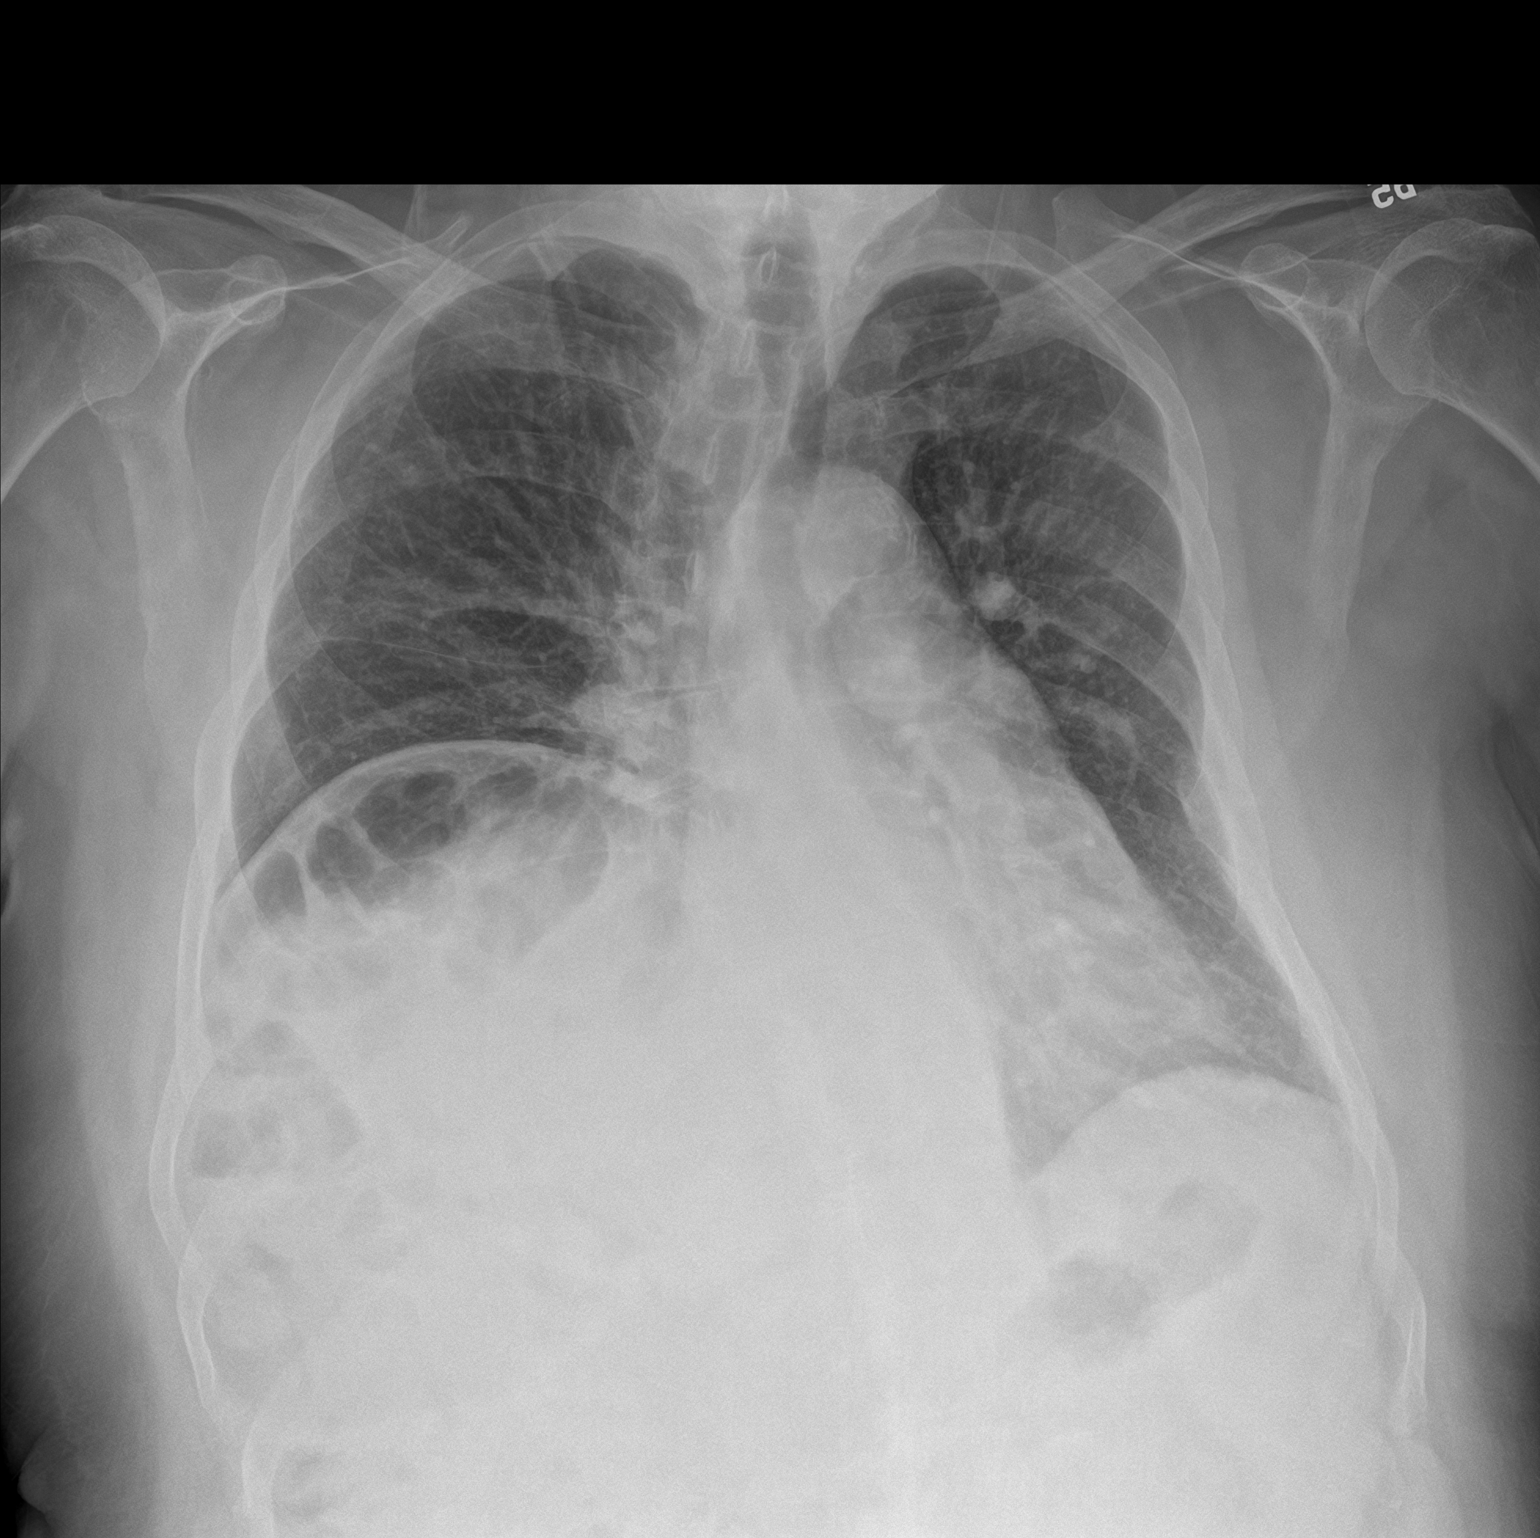

[chest lat]
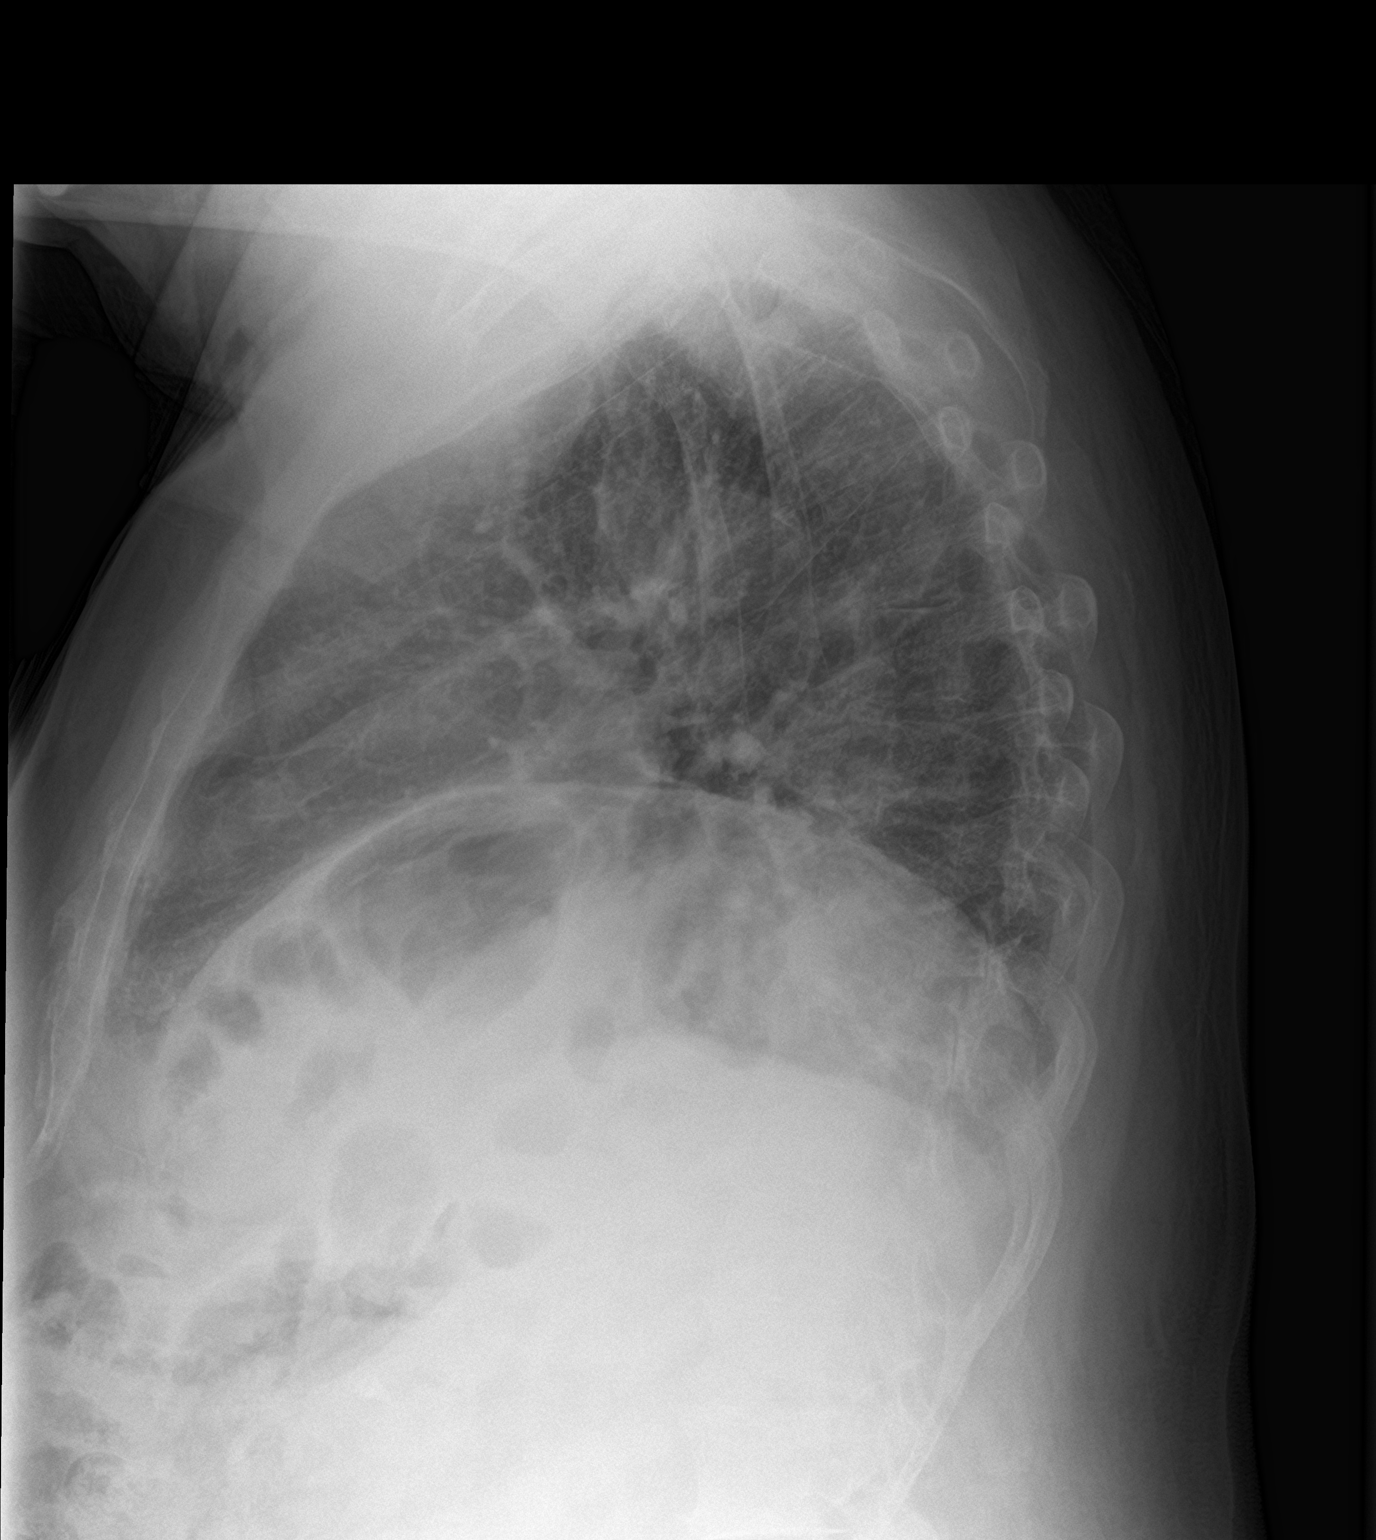

[2 of 2 positions shown; findings below may reference images not displayed]

FINDINGS: Stable prominent elevation of the right hemidiaphragm. Stable
cardiomediastinal silhouette with normal heart size and aortic
atherosclerosis. No pneumothorax. No pleural effusion. No overt
pulmonary edema. Stable chronic right basilar atelectasis. No acute
consolidative airspace disease.
IMPRESSION: Stable prominent elevation of the right hemidiaphragm with
associated chronic right basilar atelectasis. No acute
cardiopulmonary disease.

## 2019-07-15 ENCOUNTER — Encounter (INDEPENDENT_AMBULATORY_CARE_PROVIDER_SITE_OTHER): Payer: Medicare Other | Admitting: Ophthalmology

## 2019-07-15 ENCOUNTER — Other Ambulatory Visit: Payer: Self-pay

## 2019-07-15 DIAGNOSIS — I10 Essential (primary) hypertension: Secondary | ICD-10-CM | POA: Diagnosis not present

## 2019-07-15 DIAGNOSIS — H353122 Nonexudative age-related macular degeneration, left eye, intermediate dry stage: Secondary | ICD-10-CM

## 2019-07-15 DIAGNOSIS — H34811 Central retinal vein occlusion, right eye, with macular edema: Secondary | ICD-10-CM

## 2019-07-15 DIAGNOSIS — H35033 Hypertensive retinopathy, bilateral: Secondary | ICD-10-CM | POA: Diagnosis not present

## 2019-07-15 DIAGNOSIS — H43813 Vitreous degeneration, bilateral: Secondary | ICD-10-CM

## 2019-11-11 ENCOUNTER — Other Ambulatory Visit: Payer: Self-pay

## 2019-11-11 ENCOUNTER — Encounter (INDEPENDENT_AMBULATORY_CARE_PROVIDER_SITE_OTHER): Payer: Medicare Other | Admitting: Ophthalmology

## 2019-11-11 DIAGNOSIS — H43813 Vitreous degeneration, bilateral: Secondary | ICD-10-CM

## 2019-11-11 DIAGNOSIS — H353122 Nonexudative age-related macular degeneration, left eye, intermediate dry stage: Secondary | ICD-10-CM

## 2019-11-11 DIAGNOSIS — H34811 Central retinal vein occlusion, right eye, with macular edema: Secondary | ICD-10-CM | POA: Diagnosis not present

## 2019-11-11 DIAGNOSIS — H35033 Hypertensive retinopathy, bilateral: Secondary | ICD-10-CM

## 2019-11-11 DIAGNOSIS — I10 Essential (primary) hypertension: Secondary | ICD-10-CM

## 2020-03-08 ENCOUNTER — Emergency Department (HOSPITAL_COMMUNITY): Payer: Medicare Other

## 2020-03-08 ENCOUNTER — Encounter (HOSPITAL_COMMUNITY): Payer: Self-pay | Admitting: Emergency Medicine

## 2020-03-08 ENCOUNTER — Observation Stay (HOSPITAL_COMMUNITY): Payer: Medicare Other

## 2020-03-08 ENCOUNTER — Other Ambulatory Visit: Payer: Self-pay

## 2020-03-08 ENCOUNTER — Inpatient Hospital Stay (HOSPITAL_COMMUNITY)
Admission: EM | Admit: 2020-03-08 | Discharge: 2020-03-14 | DRG: 291 | Disposition: A | Discharge status: Skilled Nursing Facility | Payer: Medicare Other | Attending: Family Medicine | Admitting: Family Medicine

## 2020-03-08 DIAGNOSIS — G4733 Obstructive sleep apnea (adult) (pediatric): Secondary | ICD-10-CM | POA: Diagnosis present

## 2020-03-08 DIAGNOSIS — N400 Enlarged prostate without lower urinary tract symptoms: Secondary | ICD-10-CM | POA: Diagnosis present

## 2020-03-08 DIAGNOSIS — R0600 Dyspnea, unspecified: Secondary | ICD-10-CM

## 2020-03-08 DIAGNOSIS — Z87891 Personal history of nicotine dependence: Secondary | ICD-10-CM | POA: Diagnosis not present

## 2020-03-08 DIAGNOSIS — I5033 Acute on chronic diastolic (congestive) heart failure: Secondary | ICD-10-CM | POA: Diagnosis present

## 2020-03-08 DIAGNOSIS — R9431 Abnormal electrocardiogram [ECG] [EKG]: Secondary | ICD-10-CM | POA: Diagnosis present

## 2020-03-08 DIAGNOSIS — K921 Melena: Secondary | ICD-10-CM | POA: Diagnosis not present

## 2020-03-08 DIAGNOSIS — Z79899 Other long term (current) drug therapy: Secondary | ICD-10-CM

## 2020-03-08 DIAGNOSIS — K5900 Constipation, unspecified: Secondary | ICD-10-CM | POA: Diagnosis present

## 2020-03-08 DIAGNOSIS — K766 Portal hypertension: Secondary | ICD-10-CM | POA: Diagnosis present

## 2020-03-08 DIAGNOSIS — Z9981 Dependence on supplemental oxygen: Secondary | ICD-10-CM

## 2020-03-08 DIAGNOSIS — K5730 Diverticulosis of large intestine without perforation or abscess without bleeding: Secondary | ICD-10-CM | POA: Diagnosis present

## 2020-03-08 DIAGNOSIS — R1310 Dysphagia, unspecified: Secondary | ICD-10-CM | POA: Diagnosis present

## 2020-03-08 DIAGNOSIS — I340 Nonrheumatic mitral (valve) insufficiency: Secondary | ICD-10-CM | POA: Diagnosis not present

## 2020-03-08 DIAGNOSIS — Z20822 Contact with and (suspected) exposure to covid-19: Secondary | ICD-10-CM | POA: Diagnosis present

## 2020-03-08 DIAGNOSIS — I482 Chronic atrial fibrillation, unspecified: Secondary | ICD-10-CM | POA: Diagnosis present

## 2020-03-08 DIAGNOSIS — I110 Hypertensive heart disease with heart failure: Principal | ICD-10-CM | POA: Diagnosis present

## 2020-03-08 DIAGNOSIS — K5909 Other constipation: Secondary | ICD-10-CM | POA: Diagnosis present

## 2020-03-08 DIAGNOSIS — K6289 Other specified diseases of anus and rectum: Secondary | ICD-10-CM | POA: Diagnosis present

## 2020-03-08 DIAGNOSIS — L539 Erythematous condition, unspecified: Secondary | ICD-10-CM

## 2020-03-08 DIAGNOSIS — J9601 Acute respiratory failure with hypoxia: Secondary | ICD-10-CM | POA: Diagnosis present

## 2020-03-08 DIAGNOSIS — R601 Generalized edema: Secondary | ICD-10-CM

## 2020-03-08 DIAGNOSIS — K3189 Other diseases of stomach and duodenum: Secondary | ICD-10-CM | POA: Diagnosis present

## 2020-03-08 DIAGNOSIS — M7989 Other specified soft tissue disorders: Secondary | ICD-10-CM

## 2020-03-08 DIAGNOSIS — L0390 Cellulitis, unspecified: Secondary | ICD-10-CM | POA: Diagnosis present

## 2020-03-08 DIAGNOSIS — K625 Hemorrhage of anus and rectum: Secondary | ICD-10-CM | POA: Diagnosis not present

## 2020-03-08 DIAGNOSIS — Z7901 Long term (current) use of anticoagulants: Secondary | ICD-10-CM

## 2020-03-08 DIAGNOSIS — R6881 Early satiety: Secondary | ICD-10-CM | POA: Diagnosis present

## 2020-03-08 DIAGNOSIS — Z8249 Family history of ischemic heart disease and other diseases of the circulatory system: Secondary | ICD-10-CM

## 2020-03-08 DIAGNOSIS — I872 Venous insufficiency (chronic) (peripheral): Secondary | ICD-10-CM | POA: Diagnosis present

## 2020-03-08 DIAGNOSIS — Z9989 Dependence on other enabling machines and devices: Secondary | ICD-10-CM | POA: Diagnosis not present

## 2020-03-08 DIAGNOSIS — L03114 Cellulitis of left upper limb: Secondary | ICD-10-CM | POA: Diagnosis present

## 2020-03-08 DIAGNOSIS — E669 Obesity, unspecified: Secondary | ICD-10-CM | POA: Diagnosis present

## 2020-03-08 DIAGNOSIS — I361 Nonrheumatic tricuspid (valve) insufficiency: Secondary | ICD-10-CM | POA: Diagnosis not present

## 2020-03-08 HISTORY — DX: Essential (primary) hypertension: I10

## 2020-03-08 HISTORY — DX: Unspecified atrial fibrillation: I48.91

## 2020-03-08 HISTORY — DX: Disorder of prostate, unspecified: N42.9

## 2020-03-08 HISTORY — DX: Unspecified hemorrhoids: K64.9

## 2020-03-08 HISTORY — DX: Cellulitis, unspecified: L03.90

## 2020-03-08 LAB — COMPREHENSIVE METABOLIC PANEL
ALT: 30 U/L (ref 0–44)
AST: 37 U/L (ref 15–41)
Albumin: 3.2 g/dL — ABNORMAL LOW (ref 3.5–5.0)
Alkaline Phosphatase: 78 U/L (ref 38–126)
Anion gap: 10 (ref 5–15)
BUN: 29 mg/dL — ABNORMAL HIGH (ref 8–23)
CO2: 31 mmol/L (ref 22–32)
Calcium: 9.4 mg/dL (ref 8.9–10.3)
Chloride: 99 mmol/L (ref 98–111)
Creatinine, Ser: 1.28 mg/dL — ABNORMAL HIGH (ref 0.61–1.24)
GFR calc Af Amer: 59 mL/min — ABNORMAL LOW (ref >60)
GFR calc non Af Amer: 51 mL/min — ABNORMAL LOW (ref >60)
Glucose, Bld: 100 mg/dL — ABNORMAL HIGH (ref 70–99)
Potassium: 3.7 mmol/L (ref 3.5–5.1)
Sodium: 140 mmol/L (ref 135–145)
Total Bilirubin: 2.9 mg/dL — ABNORMAL HIGH (ref 0.3–1.2)
Total Protein: 6.6 g/dL (ref 6.5–8.1)

## 2020-03-08 LAB — CBC WITH DIFFERENTIAL/PLATELET
Abs Immature Granulocytes: 0.13 10*3/uL — ABNORMAL HIGH (ref 0.00–0.07)
Basophils Absolute: 0 10*3/uL (ref 0.0–0.1)
Basophils Relative: 0 %
Eosinophils Absolute: 0 10*3/uL (ref 0.0–0.5)
Eosinophils Relative: 0 %
HCT: 51.1 % (ref 39.0–52.0)
Hemoglobin: 16.7 g/dL (ref 13.0–17.0)
Immature Granulocytes: 1 %
Lymphocytes Relative: 4 %
Lymphs Abs: 0.8 10*3/uL (ref 0.7–4.0)
MCH: 32.1 pg (ref 26.0–34.0)
MCHC: 32.7 g/dL (ref 30.0–36.0)
MCV: 98.1 fL (ref 80.0–100.0)
Monocytes Absolute: 1.3 10*3/uL — ABNORMAL HIGH (ref 0.1–1.0)
Monocytes Relative: 7 %
Neutro Abs: 16.7 10*3/uL — ABNORMAL HIGH (ref 1.7–7.7)
Neutrophils Relative %: 88 %
Platelets: 143 10*3/uL — ABNORMAL LOW (ref 150–400)
RBC: 5.21 MIL/uL (ref 4.22–5.81)
RDW: 19.8 % — ABNORMAL HIGH (ref 11.5–15.5)
WBC: 18.9 10*3/uL — ABNORMAL HIGH (ref 4.0–10.5)
nRBC: 0 % (ref 0.0–0.2)

## 2020-03-08 LAB — BILIRUBIN, FRACTIONATED(TOT/DIR/INDIR)
Bilirubin, Direct: 0.8 mg/dL — ABNORMAL HIGH (ref 0.0–0.2)
Indirect Bilirubin: 1.9 mg/dL — ABNORMAL HIGH (ref 0.3–0.9)
Total Bilirubin: 2.7 mg/dL — ABNORMAL HIGH (ref 0.3–1.2)

## 2020-03-08 LAB — TROPONIN I (HIGH SENSITIVITY)
Troponin I (High Sensitivity): 12 ng/L (ref <18)
Troponin I (High Sensitivity): 12 ng/L (ref <18)

## 2020-03-08 LAB — BRAIN NATRIURETIC PEPTIDE: B Natriuretic Peptide: 280 pg/mL — ABNORMAL HIGH (ref 0.0–100.0)

## 2020-03-08 LAB — TSH: TSH: 0.492 u[IU]/mL (ref 0.350–4.500)

## 2020-03-08 LAB — MAGNESIUM: Magnesium: 2.5 mg/dL — ABNORMAL HIGH (ref 1.7–2.4)

## 2020-03-08 MED ORDER — APIXABAN 2.5 MG PO TABS
2.5000 mg | ORAL_TABLET | Freq: Two times a day (BID) | ORAL | Status: DC
  Administered 2020-03-08 – 2020-03-14 (×12): 2.5 mg via ORAL
  Filled 2020-03-08 (×12): qty 1

## 2020-03-08 MED ORDER — ACETAMINOPHEN 325 MG PO TABS: 650.0000 mg | ORAL_TABLET | Freq: Four times a day (QID) | ORAL | Status: DC | PRN

## 2020-03-08 MED ORDER — IOHEXOL 350 MG/ML SOLN
100.0000 mL | Freq: Once | INTRAVENOUS | Status: AC | PRN
  Administered 2020-03-08: 100 mL via INTRAVENOUS

## 2020-03-08 MED ORDER — HYDROCODONE-ACETAMINOPHEN 5-325 MG PO TABS
1.0000 | ORAL_TABLET | Freq: Four times a day (QID) | ORAL | Status: DC
  Administered 2020-03-08 – 2020-03-14 (×13): 1 via ORAL
  Filled 2020-03-08 (×16): qty 1

## 2020-03-08 MED ORDER — ACETAMINOPHEN 650 MG RE SUPP: 650.0000 mg | Freq: Four times a day (QID) | RECTAL | Status: DC | PRN

## 2020-03-08 MED ORDER — ROSUVASTATIN CALCIUM 10 MG PO TABS
10.0000 mg | ORAL_TABLET | Freq: Every morning | ORAL | Status: DC
  Administered 2020-03-09 – 2020-03-14 (×6): 10 mg via ORAL
  Filled 2020-03-08 (×6): qty 1

## 2020-03-08 MED ORDER — METOPROLOL SUCCINATE ER 50 MG PO TB24
50.0000 mg | ORAL_TABLET | Freq: Every day | ORAL | Status: DC
  Administered 2020-03-08 – 2020-03-09 (×2): 50 mg via ORAL
  Filled 2020-03-08: qty 1

## 2020-03-08 MED ORDER — ALBUTEROL SULFATE (2.5 MG/3ML) 0.083% IN NEBU: 2.5000 mg | INHALATION_SOLUTION | RESPIRATORY_TRACT | Status: DC | PRN

## 2020-03-08 MED ORDER — ONDANSETRON HCL 4 MG PO TABS: 4.0000 mg | ORAL_TABLET | Freq: Four times a day (QID) | ORAL | Status: DC | PRN

## 2020-03-08 MED ORDER — POTASSIUM CHLORIDE CRYS ER 10 MEQ PO TBCR
10.0000 meq | EXTENDED_RELEASE_TABLET | Freq: Every day | ORAL | Status: DC
  Administered 2020-03-09 – 2020-03-14 (×5): 10 meq via ORAL
  Filled 2020-03-08 (×8): qty 1

## 2020-03-08 MED ORDER — POTASSIUM CHLORIDE CRYS ER 20 MEQ PO TBCR
40.0000 meq | EXTENDED_RELEASE_TABLET | Freq: Once | ORAL | Status: AC
  Administered 2020-03-08: 40 meq via ORAL
  Filled 2020-03-08: qty 2

## 2020-03-08 MED ORDER — ONDANSETRON HCL 4 MG/2ML IJ SOLN: 4.0000 mg | Freq: Four times a day (QID) | INTRAMUSCULAR | Status: DC | PRN

## 2020-03-08 MED ORDER — FUROSEMIDE 10 MG/ML IJ SOLN
40.0000 mg | Freq: Once | INTRAMUSCULAR | Status: AC
  Administered 2020-03-08: 40 mg via INTRAVENOUS
  Filled 2020-03-08: qty 4

## 2020-03-08 MED ORDER — SODIUM CHLORIDE 0.9% FLUSH
3.0000 mL | INTRAVENOUS | Status: DC | PRN
  Administered 2020-03-09 – 2020-03-13 (×2): 3 mL via INTRAVENOUS

## 2020-03-08 MED ORDER — SODIUM CHLORIDE 0.9 % IV SOLN
1.0000 g | INTRAVENOUS | Status: DC
  Administered 2020-03-08 – 2020-03-12 (×5): 1 g via INTRAVENOUS
  Filled 2020-03-08 (×5): qty 10

## 2020-03-08 MED ORDER — TAMSULOSIN HCL 0.4 MG PO CAPS
0.4000 mg | ORAL_CAPSULE | Freq: Every day | ORAL | Status: DC
  Administered 2020-03-09 – 2020-03-14 (×5): 0.4 mg via ORAL
  Filled 2020-03-08 (×6): qty 1

## 2020-03-08 MED ORDER — ERYTHROMYCIN 5 MG/GM OP OINT
TOPICAL_OINTMENT | Freq: Three times a day (TID) | OPHTHALMIC | Status: DC
  Filled 2020-03-08 (×2): qty 3.5

## 2020-03-08 MED ORDER — POLYETHYLENE GLYCOL 3350 17 G PO PACK
17.0000 g | PACK | Freq: Every day | ORAL | Status: DC | PRN
  Administered 2020-03-10: 17 g via ORAL
  Filled 2020-03-08: qty 1

## 2020-03-08 MED ORDER — FUROSEMIDE 10 MG/ML IJ SOLN
40.0000 mg | Freq: Every day | INTRAMUSCULAR | Status: DC
  Administered 2020-03-09 – 2020-03-10 (×2): 40 mg via INTRAVENOUS
  Filled 2020-03-08 (×2): qty 4

## 2020-03-08 NOTE — H&P (Signed)
History and Physical    Jesus Fowler YOV:[id number] DOB: [birth year] DOA: 03/08/2020  PCP: Lucia Gaskins, MD   Patient coming from: Home  I have personally briefly reviewed patient's old medical records in [place]  Chief Complaint: Generalized swelling  HPI: Jesus Fowler is a 86 y.o. male with medical history significant for atrial fibrillation, hypertension.  Patient presented to the ED with complaints of generalized abdominal swelling involving his bilateral legs, abdomen, bilateral upper extremity over the past several weeks.  Not exactly sure but he tells me his baseline weight is about 198 pounds. He also had some redness to his right upper extremity, he is unable to tell me how long this has been going on, he tells me he noticed this when he was told today in the ED. Mild occasional itching, but no pain, no redness.  Was prescribed an antibiotic for this yesterday which he took.  He reports a very mild chronic unchanged cough, mostly in the mornings.    He denies chest pain, reports some chronic difficulty breathing that he does not think has changed significantly.   He tells me he used to be on 2 L of oxygen in the past but this was discontinued a long time ago as he no longer needed it.  He also used to be on Lasix but this was discontinued also, as he no longer needed it.  Quit smoking cigarettes several years ago.  ED Course: Blood pressure systolic [id number].  Heart rate 80s.  O2 sats down to 80s on arrival improved with 2 L nasal cannula.  WBC 18.9.  BNP 280. Hs troponin x 2 12.  Total bilirubin 2.9.  With normal liver enzymes.  EKG shows atrial fibrillation rate 101, with QTC prolonged at 412. IV Lasix 40 mg x 1 given in ED.  Hospitalist admit for further evaluation and management.  Review of Systems: As per HPI all other systems reviewed and negative.  Past Medical History:  Diagnosis Date  . A-fib ([place])   . Cellulitis   . Hemorrhoid   . Hypertension   .  Prostate disorder     History reviewed. No pertinent surgical history.   reports that he has quit smoking. He has never used smokeless tobacco. He reports that he does not drink alcohol or use drugs.  No Known Allergies  Family history of hypertension.  Prior to Admission medications   Medication Sig Start Date End Date Taking? Authorizing Provider  ELIQUIS 5 MG TABS tablet Take 5 mg by mouth 2 (two) times daily. 01/06/20  Yes [provider]  furosemide (LASIX) 20 MG tablet Take 20 mg by mouth daily. 03/07/20  Yes [provider]  HYDROcodone-acetaminophen (NORCO/VICODIN) 5-325 MG tablet Take 1 tablet by mouth 4 (four) times daily. 02/15/20  Yes [provider]  levofloxacin (LEVAQUIN) 500 MG tablet Take 500 mg by mouth daily. 03/07/20  Yes [provider]  metoprolol succinate (TOPROL-XL) 50 MG 24 hr tablet Take 50 mg by mouth daily. 12/30/19  Yes [provider]  predniSONE (DELTASONE) 20 MG tablet TAKE 2 TABS BY MOUTH ONCETDAILY x SEVEN DAYS; THEN 1 TAB DAILY x SEVEN DAYS. 02/29/20  Yes [provider]  rosuvastatin (CRESTOR) 10 MG tablet Take 10 mg by mouth in the morning.  02/07/20  Yes [provider]  tamsulosin (FLOMAX) 0.4 MG CAPS capsule Take 0.4 mg by mouth daily. 02/07/20  Yes [provider]  amLODipine (NORVASC) 5 MG tablet Take 5 mg by mouth  daily. 01/22/20   [provider]  cephALEXin (KEFLEX) 500 MG capsule Take 500 mg by mouth 4 (four) times daily. 09/11/19   [provider]  lisinopril-hydrochlorothiazide (ZESTORETIC) 20-12.5 MG tablet Take 1 tablet by mouth daily. 02/07/20   [provider]  potassium chloride (KLOR-CON) 10 MEQ tablet Take 10 mEq by mouth daily. 03/07/20   [provider]    Physical Exam: Vitals:   03/08/20 1553 03/08/20 1600 03/08/20 1630 03/08/20 1700  BP: 116/80 109/72 104/73 104/65  Pulse: 84  89   Resp: (!) 22 14 16 19   SpO2: 99%  98%      Constitutional: NAD, calm, comfortable Vitals:   03/08/20 1553 03/08/20 1600 03/08/20 1630 03/08/20 1700  BP: 116/80 109/72 104/73 104/65  Pulse: 84  89   Resp: (!) 22 14 16 19   SpO2: 99%  98%    Eyes: PERRL, mild purulent drainage from bilateral eyes,  ENMT: Mucous membranes are moist. Posterior pharynx clear of any exudate or lesions.  Neck: normal, supple, no masses, no thyromegaly Respiratory: Mild increased work of breathing, mild reduced air entry bilaterally otherwise clear to auscultation bilaterally, no wheezing, no crackles.  Cardiovascular: Irregular regular rate and rhythm,  1+ pitting pedal edema to knees, No carotid bruits.  Abdomen: no tenderness, no masses palpated. No hepatosplenomegaly. Bowel sounds positive.  Musculoskeletal: no clubbing / cyanosis. No joint deformity upper and lower extremities. Good ROM, no contractures. Normal muscle tone.  Skin: woody induration bilateral lower extremity consistent with chronic stasis, with dry scabbing on right lower extremity.   Erythema with pitting edema to right upper extremity extending from mid fore-arm to upper arm.  No tenderness, no differential warmth, no ulcers or wounds. Neurologic: No apparent cranial nerve abnormality, moving extremities spontaneously. Psychiatric: Normal judgment and insight. Alert and oriented x 3. Normal mood.          Labs on Admission: I have personally reviewed following labs and imaging studies  CBC: Recent Labs  Lab 03/08/20 1535  WBC 18.9*  NEUTROABS 16.7*  HGB 16.7  HCT 51.1  MCV 98.1  PLT 143*   Basic Metabolic Panel: Recent Labs  Lab 03/08/20 1535  NA 140  K 3.7  CL 99  CO2 31  GLUCOSE 100*  BUN 29*  CREATININE 1.28*  CALCIUM 9.4   Liver Function Tests: Recent Labs  Lab 03/08/20 1535  AST 37  ALT 30  ALKPHOS 78  BILITOT 2.9*  PROT 6.6  ALBUMIN 3.2*    Radiological Exams on Admission: DG Chest Portable 1 View  Result Date: 03/08/2020 CLINICAL  DATA:  Anasarca EXAM: PORTABLE CHEST 1 VIEW COMPARISON:  2018 FINDINGS: Stable elevation of the right hemidiaphragm. No new consolidation or edema. No significant pleural effusion. Stable cardiomediastinal contours. IMPRESSION: Stable significant elevation of the right hemidiaphragm. No acute finding. Electronically Signed   By: 05/08/2020 M.D.   On: 03/08/2020 16:30    EKG: Independently reviewed.  Atrial fibrillation rate 101, prolonged QTC 512.  PVCs present.  RBBB, LAFB.  No old EKG to compare.  Diffuse T wave abnormalities.  Assessment/Plan Principal Problem:   Swelling of lower extremity Active Problems:   Atrial fibrillation, chronic (HCC)   Generalized swelling -appears to be a chronic issue involving, involving lower extremities mostly on my eval. Reports abdominal and extremity swelling.  Mildly elevated BNP 280, no prior to compare.  Reports baseline weight 198, no baseline to compare in epic.  To see care everywhere.  No  echo on file.  Mildly reduced albumin of 3.2. ? Right sided heart failure. -IV Lasix 40 mg x 1 given in ED, blood pressure soft to continue IV Lasix 40 daily -Obtain echocardiogram -Strict input output, daily weights - TSH  Probable cellulitis left upper extremity- with leukocytosis of 18.9.  Erythema only otherwise presentation not entirely consistent with cellulitis.  No wounds, ulcers, no induration.  Rules out for sepsis.  Started on Levaquin as outpatient yesterday, and steroids. -IV ceftriaxone 1 g daily -Left upper extremity venous Dopplers -CBC, CMP a.m. - Hold steroids for now   Acute respiratory failure- O2 sats down to 80s improved with 2 L O2, on my evaluation O2 sats dropped again when I reduced patient's oxygen.  Mild increased work of breathing on my evaluation.  Chest x-ray clear.  History of poor compliance with medications.  No history of COPD, remote history of smoking. -CTA chest -Supplemental O2  -Echocardiogram - F/u COVID PCR  test  Atrial fibrillation-rates 80s to 90s.  Rate controlled and anticoagulated. -Resume home metoprolol, Eliquis.  Prolonged QTC-512.  Potassium 3.7 -Check magnesium  Elevated bilirubin-2.9.  Liver enzymes are normal.  Abdominal exam benign. -CMP a.m. -Fractionated bilirubin -If persistent consider ultrasound/other imaging.  Hypertension-blood pressure soft systolic low 100s. -Hold Norvasc, will also hold lisinopril HCTZ due to contrast exposure and while on IV Lasix. -Resume tamsulosin  DVT prophylaxis: Eliquis Code Status: Full code Family Communication: None at bedside Disposition Plan: ~ 2 days, pending further work-up, clinical course. Consults called: None Admission status: Inpatient, telemetry I certify that at the point of admission it is my clinical judgment that the patient will require inpatient hospital care spanning beyond 2 midnights from the point of admission due to high intensity of service, high risk for further deterioration and high frequency of surveillance required. The following factors support the patient status of inpatient: Requiring supplemental O2, pending further work-up.   Onnie Boer MD [facility]  03/08/2020, 8:15 PM

## 2020-03-08 NOTE — ED Triage Notes (Signed)
Pt from home with wife. +4 pitting edema in legs, Groin and abdomen. Redness throughout body.

## 2020-03-08 NOTE — ED Provider Notes (Signed)
Emergency Department Provider Note   I have reviewed the triage vital signs and the nursing notes.   HISTORY  Chief Complaint Leg Swelling   HPI Jesus Fowler is a 86 y.o. male with PMH of A-fib on Eliquis and HTN presents emergency department for evaluation of diffuse pitting edema in the arms, legs, abdomen noted by Dr. Janna Arch (PCP) during multiple home visits this week.  There have also been some areas of redness concerning for cellulitis.  The patient's primary care doctor has seen the patient 2-3 times this week and restarted Lasix and has been covering with Keflex.  Patient has not had significant reduction in fluid on reevaluation today and was referred to the emergency department.  He had been on Lasix previously but states he was taken off for some unknown reason.  He has been back on for only the last week.  No fevers. No radiation of symptoms or modifying factors. Denies CP or SOB.  According to nursing staff here on arrival the patient was found to have hypoxemia in the high 80s and placed on 2 L nasal cannula.    Past Medical History:  Diagnosis Date  . A-fib (HCC)   . Cellulitis   . Hemorrhoid   . Hypertension   . Prostate disorder     Patient Active Problem List   Diagnosis Date Noted  . Swelling of lower extremity 03/08/2020  . Atrial fibrillation, chronic (HCC) 03/08/2020  . Swelling of both lower extremities 03/08/2020    History reviewed. No pertinent surgical history.  Allergies Patient has no known allergies.  History reviewed. No pertinent family history.  Social History Social History   Tobacco Use  . Smoking status: Former Games developer  . Smokeless tobacco: Never Used  Substance Use Topics  . Alcohol use: Never  . Drug use: Never    Review of Systems  Constitutional: No fever/chills Eyes: No visual changes. ENT: No sore throat. Cardiovascular: Denies chest pain but has swelling all over.  Respiratory: Positive shortness of  breath. Gastrointestinal: No abdominal pain.  No nausea, no vomiting.  No diarrhea.  No constipation. Genitourinary: Negative for dysuria. Musculoskeletal: Negative for back pain. Skin: Arm rash.  Neurological: Negative for headaches, focal weakness or numbness.  10-point ROS otherwise negative.  ____________________________________________   PHYSICAL EXAM:  VITAL SIGNS: Vitals:   03/10/20 1215 03/10/20 1337  BP:  105/67  Pulse:  90  Resp:  15  Temp:  98 F (36.7 C)  SpO2: 95% 95%    Constitutional: Alert and oriented. Well appearing and in no acute distress. Eyes: Conjunctivae are normal.  Head: Atraumatic. Nose: No congestion/rhinnorhea. Mouth/Throat: Mucous membranes are moist.  Neck: No stridor.  Cardiovascular: Normal rate, regular rhythm. Good peripheral circulation. Grossly normal heart sounds. Anasarca noted.  Respiratory: Normal respiratory effort.  No retractions. Lungs with crackles at the bases. Gastrointestinal: Soft and nontender. No distention.  Musculoskeletal: No lower extremity tenderness with 4+ pitting edema.  No gross deformities of extremities. Neurologic:  Normal speech and language. No gross focal neurologic deficits are appreciated.  Skin:  Skin is warm, dry and intact. No rash noted.  ____________________________________________   LABS (all labs ordered are listed, but only abnormal results are displayed)  Labs Reviewed  COMPREHENSIVE METABOLIC PANEL - Abnormal; Notable for the following components:      Result Value   Glucose, Bld 100 (*)    BUN 29 (*)    Creatinine, Ser 1.28 (*)    Albumin 3.2 (*)  Total Bilirubin 2.9 (*)    GFR calc non Af Amer 51 (*)    GFR calc Af Amer 59 (*)    All other components within normal limits  BRAIN NATRIURETIC PEPTIDE - Abnormal; Notable for the following components:   B Natriuretic Peptide 280.0 (*)    All other components within normal limits  CBC WITH DIFFERENTIAL/PLATELET - Abnormal; Notable for  the following components:   WBC 18.9 (*)    RDW 19.8 (*)    Platelets 143 (*)    Neutro Abs 16.7 (*)    Monocytes Absolute 1.3 (*)    Abs Immature Granulocytes 0.13 (*)    All other components within normal limits  MAGNESIUM - Abnormal; Notable for the following components:   Magnesium 2.5 (*)    All other components within normal limits  BILIRUBIN, FRACTIONATED(TOT/DIR/INDIR) - Abnormal; Notable for the following components:   Total Bilirubin 2.7 (*)    Bilirubin, Direct 0.8 (*)    Indirect Bilirubin 1.9 (*)    All other components within normal limits  COMPREHENSIVE METABOLIC PANEL - Abnormal; Notable for the following components:   BUN 29 (*)    Total Protein 5.9 (*)    Albumin 2.8 (*)    Total Bilirubin 2.2 (*)    GFR calc non Af Amer 53 (*)    All other components within normal limits  CBC - Abnormal; Notable for the following components:   WBC 21.1 (*)    RDW 19.6 (*)    Platelets 139 (*)    All other components within normal limits  CBC - Abnormal; Notable for the following components:   WBC 17.0 (*)    HCT 52.2 (*)    MCV 100.4 (*)    RDW 20.0 (*)    Platelets 108 (*)    All other components within normal limits  BASIC METABOLIC PANEL - Abnormal; Notable for the following components:   Chloride 97 (*)    CO2 35 (*)    Glucose, Bld 112 (*)    BUN 29 (*)    Creatinine, Ser 1.25 (*)    GFR calc non Af Amer 53 (*)    All other components within normal limits  SARS CORONAVIRUS 2 (TAT 6-24 HRS)  CULTURE, BLOOD (ROUTINE X 2)  CULTURE, BLOOD (ROUTINE X 2)  TSH  CBC  BASIC METABOLIC PANEL  MAGNESIUM  TROPONIN I (HIGH SENSITIVITY)  TROPONIN I (HIGH SENSITIVITY)   ____________________________________________  EKG   EKG Interpretation  Date/Time:  Tuesday March 08 2020 15:50:32 EST Ventricular Rate:  101 PR Interval:    QRS Duration: 148 QT Interval:  395 QTC Calculation: 512 R Axis:   -59 Text Interpretation: Atrial fibrillation Ventricular premature  complex RBBB and LAFB No STEMI Confirmed by Alona Bene [phone]) on 03/08/2020 4:01:20 PM       ____________________________________________  RADIOLOGY  CXR reviewed.  ____________________________________________   PROCEDURES  Procedure(s) performed:   Procedures  CRITICAL CARE Performed by: Maia Plan Total critical care time: 35 minutes Critical care time was exclusive of separately billable procedures and treating other patients. Critical care was necessary to treat or prevent imminent or life-threatening deterioration. Critical care was time spent personally by me on the following activities: development of treatment plan with patient and/or surrogate as well as nursing, discussions with consultants, evaluation of patient's response to treatment, examination of patient, obtaining history from patient or surrogate, ordering and performing treatments and interventions, ordering and review of laboratory studies, ordering and  review of radiographic studies, pulse oximetry and re-evaluation of patient's condition.  Nanda Quinton, MD Emergency Medicine  ____________________________________________   INITIAL IMPRESSION / ASSESSMENT AND PLAN / ED COURSE  Pertinent labs & imaging results that were available during my care of the patient were reviewed by me and considered in my medical decision making (see chart for details).   Patient arrives to the emergency department with essentially anasarca.  He has pitting edema in his bilateral upper and lower extremities as well as his abdomen.  There is erythema over his left arm and mild erythema over the abdomen.  He has skin changes consistent with chronic venous stasis dermatitis.  He does have a leukocytosis to 18.9 and BNP of 280.  Waiting on kidney function and will begin to diuresis. CXR reviewed with no acute findings.  Starting lasix here. Labs reviewed.   Discussed patient's case with [facility] to request admission. Patient and family  (if present) updated with plan. Care transferred to [facility] service.  I reviewed all nursing notes, vitals, pertinent old records, EKGs, labs, imaging (as available).  ____________________________________________  FINAL CLINICAL IMPRESSION(S) / ED DIAGNOSES  Final diagnoses:  Anasarca     MEDICATIONS GIVEN DURING THIS VISIT:  Medications  cefTRIAXone (ROCEPHIN) 1 g in sodium chloride 0.9 % 100 mL IVPB (1 g Intravenous New Bag/Given 03/09/20 2002)  apixaban (ELIQUIS) tablet 2.5 mg (2.5 mg Oral Given 03/10/20 0956)  HYDROcodone-acetaminophen (NORCO/VICODIN) 5-325 MG per tablet 1 tablet (1 tablet Oral Not Given 03/10/20 1739)  potassium chloride (KLOR-CON) CR tablet 10 mEq (10 mEq Oral Not Given 03/10/20 0955)  rosuvastatin (CRESTOR) tablet 10 mg (10 mg Oral Given 03/10/20 0600)  tamsulosin (FLOMAX) capsule 0.4 mg (0.4 mg Oral Not Given 03/10/20 0955)  acetaminophen (TYLENOL) tablet 650 mg (has no administration in time range)    Or  acetaminophen (TYLENOL) suppository 650 mg (has no administration in time range)  sodium chloride flush (NS) 0.9 % injection 3 mL (3 mLs Intravenous Given 03/09/20 2001)  polyethylene glycol (MIRALAX / GLYCOLAX) packet 17 g (17 g Oral Given 03/10/20 1745)  ondansetron (ZOFRAN) tablet 4 mg (has no administration in time range)    Or  ondansetron (ZOFRAN) injection 4 mg (has no administration in time range)  albuterol (PROVENTIL) (2.5 MG/3ML) 0.083% nebulizer solution 2.5 mg (has no administration in time range)  erythromycin ophthalmic ointment ( Both Eyes Given 03/10/20 1405)  hydrocortisone cream 1 % 1 application (has no administration in time range)  furosemide (LASIX) injection 20 mg (20 mg Intravenous Given 03/10/20 1736)  metoprolol succinate (TOPROL-XL) 24 hr tablet 25 mg (has no administration in time range)  albuterol (PROVENTIL) (2.5 MG/3ML) 0.083% nebulizer solution 3 mL (3 mLs Inhalation Not Given 03/10/20 1716)  furosemide (LASIX) injection 40 mg (40 mg  Intravenous Given 03/08/20 1832)  iohexol (OMNIPAQUE) 350 MG/ML injection 100 mL (100 mLs Intravenous Contrast Given 03/08/20 2013)  potassium chloride SA (KLOR-CON) CR tablet 40 mEq (40 mEq Oral Given 03/08/20 2159)    Note:  This document was prepared using Dragon voice recognition software and may include unintentional dictation errors.  Nanda Quinton, MD, [facility] Emergency Medicine    Parveen Freehling, [facility], MD 03/10/20 Dorthula Perfect

## 2020-03-09 ENCOUNTER — Inpatient Hospital Stay (HOSPITAL_COMMUNITY): Payer: Medicare Other

## 2020-03-09 ENCOUNTER — Encounter (HOSPITAL_COMMUNITY): Payer: Self-pay | Admitting: Internal Medicine

## 2020-03-09 DIAGNOSIS — I340 Nonrheumatic mitral (valve) insufficiency: Secondary | ICD-10-CM

## 2020-03-09 DIAGNOSIS — I361 Nonrheumatic tricuspid (valve) insufficiency: Secondary | ICD-10-CM

## 2020-03-09 LAB — ECHOCARDIOGRAM COMPLETE
Height: 66 in
Weight: 3791.91 oz

## 2020-03-09 LAB — CBC
HCT: 48.5 % (ref 39.0–52.0)
Hemoglobin: 15.7 g/dL (ref 13.0–17.0)
MCH: 31.7 pg (ref 26.0–34.0)
MCHC: 32.4 g/dL (ref 30.0–36.0)
MCV: 98 fL (ref 80.0–100.0)
Platelets: 139 10*3/uL — ABNORMAL LOW (ref 150–400)
RBC: 4.95 MIL/uL (ref 4.22–5.81)
RDW: 19.6 % — ABNORMAL HIGH (ref 11.5–15.5)
WBC: 21.1 10*3/uL — ABNORMAL HIGH (ref 4.0–10.5)
nRBC: 0 % (ref 0.0–0.2)

## 2020-03-09 LAB — COMPREHENSIVE METABOLIC PANEL
ALT: 27 U/L (ref 0–44)
AST: 30 U/L (ref 15–41)
Albumin: 2.8 g/dL — ABNORMAL LOW (ref 3.5–5.0)
Alkaline Phosphatase: 72 U/L (ref 38–126)
Anion gap: 8 (ref 5–15)
BUN: 29 mg/dL — ABNORMAL HIGH (ref 8–23)
CO2: 31 mmol/L (ref 22–32)
Calcium: 9.1 mg/dL (ref 8.9–10.3)
Chloride: 101 mmol/L (ref 98–111)
Creatinine, Ser: 1.24 mg/dL (ref 0.61–1.24)
GFR calc Af Amer: >60 mL/min (ref >60)
GFR calc non Af Amer: 53 mL/min — ABNORMAL LOW (ref >60)
Glucose, Bld: 76 mg/dL (ref 70–99)
Potassium: 4.2 mmol/L (ref 3.5–5.1)
Sodium: 140 mmol/L (ref 135–145)
Total Bilirubin: 2.2 mg/dL — ABNORMAL HIGH (ref 0.3–1.2)
Total Protein: 5.9 g/dL — ABNORMAL LOW (ref 6.5–8.1)

## 2020-03-09 LAB — SARS CORONAVIRUS 2 (TAT 6-24 HRS): SARS Coronavirus 2: NEGATIVE

## 2020-03-09 MED ORDER — HYDROCORTISONE 1 % EX CREA
1.0000 "application " | TOPICAL_CREAM | Freq: Three times a day (TID) | CUTANEOUS | Status: DC | PRN
  Filled 2020-03-09: qty 28

## 2020-03-09 NOTE — Progress Notes (Signed)
*  PRELIMINARY RESULTS* Echocardiogram 2D Echocardiogram has been performed.  Jesus Fowler 03/09/2020, 12:21 PM

## 2020-03-09 NOTE — Progress Notes (Signed)
Patient Demographics:    Jesus Fowler, is a 86 y.o. male, DOB - 01/21/1933, XNA:[id number]  Admit date - 03/08/2020   Admitting Physician Ejiroghene Wendall Stade, MD  Outpatient Primary MD for the patient is [name], MD  LOS - 1   Chief Complaint  Patient presents with   Leg Swelling        Subjective:    Jesus Fowler today has no fevers, no emesis,  No chest pain---  -Dyspnea is not worse, continues to require oxygen , left arm pain and swelling persist --Discussed with patient's son who is visiting from [place] -he is currently at patient's bedside  Assessment  & Plan :    Principal Problem:   Swelling of lower extremity Active Problems:   Atrial fibrillation, chronic (HCC)   Swelling of both lower extremities  Brief Summary -86 y.o. male with medical history significant for atrial fibrillation, hypertension.  Patient presented to the ED with complaints of generalized abdominal swelling involving his bilateral legs, abdomen, bilateral upper extremity over  several weeks PTA  admitted on 03/08/2020 with dyspnea on acute hypoxic respiratory failure and concerns about left upper extremity cellulitis and possible hematoma/ecchymosis  A/p  1) acute hypoxic respiratory failure--- suspect acute on chronic HFpEF/CHF related, BNP is 280,  -CTA chest w/o acute PE, small right pleural effusion and right-sided atelectasis noted -Patient is a reformed smoker so cannot exclude some component of COPD -On further questioning patient was previously on home O2 which was later discontinued... Currently requiring 4 to 5 L of oxygen via nasal cannula -Echo with EF of 60 to 65% -Continue gentle diuresis, daily weights, fluid input and output monitoring,  -Covid negative -try to wean off oxygen  2)Left upper extremity cellulitis----WBC is even higher at 21.1, PTA patient was treated with Levaquin as  outpatient, no fevers, continue IV Rocephin, Blood Cx NGTD -Left upper extremity venous Dopplers without acute DVT  3)Generalized Weakness and Deconditioning -TSH 0.4, PT eval requested  4) chronic Atrial fibrillation- rates 80s to 90s.  Rate controlled and anticoagulated. -Continue Eliquis for stroke prophylaxis and metoprolol for rate control  5)Prolonged QTC-512.  ---  Keep potassium close to 4 magnesium above 2  6)Elevated bilirubin-   T bil 2.7 to 2.9 (indirect 1.9, direct 0.8),  --LFTs WNL -Asymptomatic at this time outpatient follow-up advised.  7)Hypertension--- BP remained stable despite holding lisinopril/HCTZ and amlodipine  8)BPH--- continue Flomax  Disposition/Need for in-Hospital Stay- patient unable to be discharged at this time due to --- worsening left upper extremity cellulitis with worsening leukocytosis and the patient to failed outpatient oral Levaquin requiring IV Rocephin, hypoxic respiratory failure requiring IV diuresis and treatment of oxygen -Not medically ready for discharge, will most likely need SNF Rehab  Code Status : Full  Family Communication:   (patient is alert, awake and coherent) Discussed with son who is visiting from [place]  Consults  :  na DVT Prophylaxis  : eliquis/- SCDs   Lab Results  Component Value Date   PLT 139 (L) 03/09/2020    Inpatient Medications  Scheduled Meds:  apixaban  2.5 mg Oral BID   erythromycin   Both Eyes Q8H   furosemide  40 mg Intravenous Daily   HYDROcodone-acetaminophen  1  tablet Oral QID   metoprolol succinate  50 mg Oral Daily   potassium chloride  10 mEq Oral Daily   rosuvastatin  10 mg Oral q AM   tamsulosin  0.4 mg Oral Daily   Continuous Infusions:  cefTRIAXone (ROCEPHIN)  IV 1 g (03/09/20 2002)   PRN Meds:.acetaminophen **OR** acetaminophen, albuterol, hydrocortisone cream, ondansetron **OR** ondansetron (ZOFRAN) IV, polyethylene glycol, sodium chloride flush    Anti-infectives  (From admission, onward)   Start     Dose/Rate Route Frequency Ordered Stop   03/08/20 2100  cefTRIAXone (ROCEPHIN) 1 g in sodium chloride 0.9 % 100 mL IVPB     1 g 200 mL/hr over 30 Minutes Intravenous Every 24 hours 03/08/20 2006          Objective:   Vitals:   03/09/20 0500 03/09/20 1103 03/09/20 1730 03/09/20 2015  BP:    97/61  Pulse:    76  Resp:    16  Temp:    98.6 F (37 C)  TempSrc:    Oral  SpO2:  96% 94% 99%  Weight: 107.5 kg     Height:        Wt Readings from Last 3 Encounters:  03/09/20 107.5 kg     Intake/Output Summary (Last 24 hours) at 03/09/2020 2024 Last data filed at 03/09/2020 1700 Gross per 24 hour  Intake --  Output 1650 ml  Net -1650 ml   Physical Exam  Gen:- Awake Alert,  In no apparent distress  HEENT:- [place].AT, No sclera icterus Eye--bilateral conjunctivitis with purulent drainage Nose -5L/min Neck-Supple Neck,No JVD,.  Lungs-diminished breath sounds, no wheezing  CV- S1, S2 normal, irregularly irregular  abd-  +ve B.Sounds, Abd Soft, No tenderness,    Extremity/Skin:-  pedal pulses present, left upper extremity swelling, warmth, tenderness and erythema is not worse, no open draining wounds, chronic lower extremity edema, chronic venous stasis dermatitis type changes of both lower extremities Psych-affect is appropriate, oriented x3 Neuro-generalized weakness and deconditioning, no new focal deficits, no tremors   Data Review:   Micro Results Recent Results (from the past 240 hour(s))  SARS CORONAVIRUS 2 (TAT 6-24 HRS) Nasopharyngeal Nasopharyngeal Swab     Status: None   Collection Time: 03/08/20 11:30 PM   Specimen: Nasopharyngeal Swab  Result Value Ref Range Status   SARS Coronavirus 2 NEGATIVE NEGATIVE Final    Comment: (NOTE) SARS-CoV-2 target nucleic acids are NOT DETECTED. The SARS-CoV-2 RNA is generally detectable in upper and lower respiratory specimens during the acute phase of infection. Negative results do not preclude  SARS-CoV-2 infection, do not rule out co-infections with other pathogens, and should not be used as the sole basis for treatment or other patient management decisions. Negative results must be combined with clinical observations, patient history, and epidemiological information. The expected result is Negative. Fact Sheet for Patients: HairSlick.nohttps://www.fda.gov/media/138098/download Fact Sheet for Healthcare Providers: quierodirigir.comhttps://www.fda.gov/media/138095/download This test is not yet approved or cleared by the [place] FDA and  has been authorized for detection and/or diagnosis of SARS-CoV-2 by FDA under an Emergency Use Authorization (EUA). This EUA will remain  in effect (meaning this test can be used) for the duration of the COVID-19 declaration under Section 56 4(b)(1) of the Act, 21 U.S.C. section 360bbb-3(b)(1), unless the authorization is terminated or revoked sooner. Performed at [facility], [address] [place], [place] [address]     Radiology Reports CT ANGIO CHEST PE W OR WO CONTRAST  Result Date: 03/08/2020 CLINICAL DATA:  Hypoxia and peripheral edema EXAM: CT ANGIOGRAPHY CHEST WITH CONTRAST TECHNIQUE: Multidetector CT imaging of the chest was performed using the standard protocol during bolus administration of intravenous contrast. Multiplanar CT image reconstructions and MIPs were obtained to evaluate the vascular anatomy. CONTRAST:  OMNIPAQUE IOHEXOL 350 MG/ML SOLN COMPARISON:  Chest x-ray from earlier in the same day, CT from 04/18/2004. FINDINGS: Cardiovascular: Thoracic aorta and its branches demonstrate atherosclerotic calcifications. No aneurysmal dilatation or dissection is seen. Coronary calcifications are noted. No significant cardiac enlargement is seen. The pulmonary artery shows a normal branching pattern without intraluminal filling defect to suggest pulmonary embolism. Mediastinum/Nodes: Thoracic inlet is within normal limits. No hilar or mediastinal  adenopathy is noted. The esophagus as visualized is within normal limits. Lungs/Pleura: Lungs are well aerated bilaterally. A 5 mm nodule is noted in the medial aspect of the left upper lobe best seen on image number 21 of series 6. This has increased in size from the prior exam sixteen years previous at which time it measured 1-2 mm elevation of the right hemidiaphragm is seen. This is stable in appearance from the prior exam. No other nodules are seen. Small right pleural effusion is noted with right basilar atelectasis. Upper Abdomen: Visualized abdomen shows no acute abnormality. Musculoskeletal: Changes of anasarca are noted peripherally consistent with that described in the lower extremities. Degenerative changes of the thoracic spine are seen. No acute compression deformity is noted. Review of the MIP images confirms the above findings. IMPRESSION: No evidence of pulmonary emboli. Small right pleural effusion with right basilar atelectasis. 5 mm nodule in the left upper lobe increased in size from 16 years ago at which time it measured 1-2 mm. No follow-up needed if patient is low-risk. Non-contrast chest CT can be considered in 12 months if patient is high-risk. This recommendation follows the consensus statement: Guidelines for Management of Incidental Pulmonary Nodules Detected on CT Images: From the Fleischner Society 2017; Radiology 2017; 284:228-243. Changes of anasarca. Aortic Atherosclerosis (ICD10-I70.0). Electronically Signed   By: Alcide Clever M.D.   On: 03/08/2020 20:33   US Venous Img Upper Uni Left (DVT)  Result Date: 03/09/2020 CLINICAL DATA:  Left upper extremity edema.  Evaluate for DVT. EXAM: LEFT UPPER EXTREMITY VENOUS DOPPLER ULTRASOUND TECHNIQUE: Gray-scale sonography with graded compression, as well as color Doppler and duplex ultrasound were performed to evaluate the upper extremity deep venous system from the level of the subclavian vein and including the jugular, axillary,  basilic, radial, ulnar and upper cephalic vein. Spectral Doppler was utilized to evaluate flow at rest and with distal augmentation maneuvers. COMPARISON:  None. FINDINGS: Contralateral Subclavian Vein: Respiratory phasicity is normal and symmetric with the symptomatic side. No evidence of thrombus. Normal compressibility. Internal Jugular Vein: No evidence of thrombus. Normal compressibility, respiratory phasicity and response to augmentation. Subclavian Vein: No evidence of thrombus. Normal compressibility, respiratory phasicity and response to augmentation. Axillary Vein: No evidence of thrombus. Normal compressibility, respiratory phasicity and response to augmentation. Cephalic Vein: No evidence of thrombus. Normal compressibility, respiratory phasicity and response to augmentation. Basilic Vein: No evidence of thrombus. Normal compressibility, respiratory phasicity and response to augmentation. Brachial Veins: No evidence of thrombus. Normal compressibility, respiratory phasicity and response to augmentation. Radial Veins: No evidence of thrombus. Normal compressibility, respiratory phasicity and response to augmentation. Ulnar Veins: No evidence of thrombus. Normal compressibility, respiratory phasicity and response to augmentation. Venous Reflux:  None visualized. Other Findings: There is a moderate amount of subcutaneous edema at the level of  the left arm and forearm. IMPRESSION: No evidence of DVT within the left upper extremity. Electronically Signed   By: Simonne Come M.D.   On: 03/09/2020 16:16   DG Chest Portable 1 View  Result Date: 03/08/2020 CLINICAL DATA:  Anasarca EXAM: PORTABLE CHEST 1 VIEW COMPARISON:  2018 FINDINGS: Stable elevation of the right hemidiaphragm. No new consolidation or edema. No significant pleural effusion. Stable cardiomediastinal contours. IMPRESSION: Stable significant elevation of the right hemidiaphragm. No acute finding. Electronically Signed   By: Guadlupe Spanish M.D.    On: 03/08/2020 16:30   ECHOCARDIOGRAM COMPLETE  Result Date: 03/09/2020    ECHOCARDIOGRAM REPORT   Patient Name:   RESHARD GUILLET Date of Exam: 03/09/2020 Medical Rec #:  [id number]       Height:       66.0 in Accession #:    [id number]      Weight:       237.0 lb Date of Birth:  01-17-1933      BSA:          2.149 m Patient Age:    86 years        BP:           106/83 mmHg Patient Gender: M               HR:           97 bpm. [facility] Procedure: 2D Echo Indications:    Swelling of both lower extremities  History:        Patient has no prior history of Echocardiogram examinations.                 Arrythmias:Atrial Fibrillation; Risk Factors:Former Smoker and                 Hypertension. Cellulitis.  Sonographer:    Jeryl Columbia RDCS (AE) Referring Phys: [id number] Heloise Beecham [facility] IMPRESSIONS  1. Left ventricular ejection fraction, by estimation, is 60 to 65%. The left ventricle has normal function. The left ventricle has no regional wall motion abnormalities. There is mild concentric left ventricular hypertrophy. Left ventricular diastolic parameters were normal. There is the interventricular septum is flattened in systole, consistent with right ventricular pressure overload.  2. Right ventricular systolic function is mildly reduced. The right ventricular size is mildly enlarged. There is moderately elevated pulmonary artery systolic pressure.  3. Right atrial size was mildly dilated.  4. The mitral valve is degenerative. Mild mitral valve regurgitation.  5. Tricuspid valve regurgitation is moderate.  6. The aortic valve is tricuspid. Aortic valve regurgitation is not visualized. Mild aortic valve sclerosis is present, with no evidence of aortic valve stenosis.  7. The inferior vena cava is dilated in size with <50% respiratory variability, suggesting right atrial pressure of 15 mmHg. FINDINGS  Left Ventricle: Left ventricular ejection fraction, by estimation, is 60 to 65%. The left  ventricle has normal function. The left ventricle has no regional wall motion abnormalities. The left ventricular internal cavity size was normal in size. There is  mild concentric left ventricular hypertrophy. The interventricular septum is flattened in systole, consistent with right ventricular pressure overload. Left ventricular diastolic parameters were normal. Right Ventricle: The right ventricular size is mildly enlarged. No increase in right ventricular wall thickness. Right ventricular systolic function is mildly reduced. There is moderately elevated pulmonary artery systolic pressure. The tricuspid regurgitant velocity is 2.96 m/s, and with an assumed right atrial pressure of 15 mmHg, the estimated  right ventricular systolic pressure is 50.0 mmHg. Left Atrium: Left atrial size was normal in size. Right Atrium: Right atrial size was mildly dilated. Pericardium: There is no evidence of pericardial effusion. Mitral Valve: The mitral valve is degenerative in appearance. There is mild thickening of the mitral valve leaflet(s). Moderate mitral annular calcification. Mild mitral valve regurgitation. Tricuspid Valve: The tricuspid valve is grossly normal. Tricuspid valve regurgitation is moderate. Aortic Valve: The aortic valve is tricuspid. . There is mild thickening and mild calcification of the aortic valve. Aortic valve regurgitation is not visualized. Mild aortic valve sclerosis is present, with no evidence of aortic valve stenosis. Mild to moderate aortic valve annular calcification. There is mild thickening of the aortic valve. There is mild calcification of the aortic valve. Pulmonic Valve: The pulmonic valve was grossly normal. Pulmonic valve regurgitation is mild. Aorta: The aortic root is normal in size and structure. Venous: The inferior vena cava is dilated in size with less than 50% respiratory variability, suggesting right atrial pressure of 15 mmHg. IAS/Shunts: The interatrial septum was not well  visualized.  LEFT VENTRICLE PLAX 2D LVIDd:         4.19 cm  Diastology LVIDs:         2.49 cm  LV e' lateral:   13.60 cm/s LV PW:         1.29 cm  LV E/e' lateral: 8.0 LV IVS:        1.12 cm  LV e' medial:    10.10 cm/s LVOT diam:     2.00 cm  LV E/e' medial:  10.8 LVOT Area:     3.14 cm  RIGHT VENTRICLE TAPSE (M-mode): 1.3 cm LEFT ATRIUM             Index       RIGHT ATRIUM           Index LA diam:        5.00 cm 2.33 cm/m  RA Area:     22.40 cm LA Vol (A2C):   48.2 ml 22.43 ml/m RA Volume:   63.50 ml  29.54 ml/m LA Vol (A4C):   44.5 ml 20.70 ml/m LA Biplane Vol: 46.6 ml 21.68 ml/m   AORTA Ao Root diam: 2.90 cm MITRAL VALVE                TRICUSPID VALVE MV Area (PHT): 3.58 cm     TR Peak grad:   35.0 mmHg MV Decel Time: 212 msec     TR Vmax:        296.00 cm/s MR Peak grad: 46.2 mmHg MR Mean grad: 25.0 mmHg     SHUNTS MR Vmax:      340.00 cm/s   Systemic Diam: 2.00 cm MR Vmean:     236.0 cm/s MV E velocity: 108.67 cm/s Prentice Docker MD Electronically signed by Prentice Docker MD Signature Date/Time: 03/09/2020/12:59:08 PM    Final      CBC Recent Labs  Lab 03/08/20 1535 03/09/20 0554  WBC 18.9* 21.1*  HGB 16.7 15.7  HCT 51.1 48.5  PLT 143* 139*  MCV 98.1 98.0  MCH 32.1 31.7  MCHC 32.7 32.4  RDW 19.8* 19.6*  LYMPHSABS 0.8  --   MONOABS 1.3*  --   EOSABS 0.0  --   BASOSABS 0.0  --     Chemistries  Recent Labs  Lab 03/08/20 1535 03/08/20 1758 03/09/20 0554  NA 140  --  140  K 3.7  --  4.2  CL 99  --  101  CO2 31  --  31  GLUCOSE 100*  --  76  BUN 29*  --  29*  CREATININE 1.28*  --  1.24  CALCIUM 9.4  --  9.1  MG  --  2.5*  --   AST 37  --  30  ALT 30  --  27  ALKPHOS 78  --  72  BILITOT 2.9* 2.7* 2.2*   ------------------------------------------------------------------------------------------------------------------ No results for input(s): CHOL, HDL, LDLCALC, TRIG, CHOLHDL, LDLDIRECT in the last 72 hours.  No results found for:  HGBA1C ------------------------------------------------------------------------------------------------------------------ Recent Labs    03/08/20 1535  TSH 0.492   ------------------------------------------------------------------------------------------------------------------ No results for input(s): VITAMINB12, FOLATE, FERRITIN, TIBC, IRON, RETICCTPCT in the last 72 hours.  Coagulation profile No results for input(s): INR, PROTIME in the last 168 hours.  No results for input(s): DDIMER in the last 72 hours.  Cardiac Enzymes No results for input(s): CKMB, TROPONINI, MYOGLOBIN in the last 168 hours.  Invalid input(s): CK ------------------------------------------------------------------------------------------------------------------    Component Value Date/Time   BNP 280.0 (H) 03/08/2020 1535     Roxan Hockey M.D on 03/09/2020 at 8:24 PM  Go to [email or web address] - for contact info  [facility] - Office  [phone]

## 2020-03-10 LAB — BASIC METABOLIC PANEL
Anion gap: 8 (ref 5–15)
BUN: 29 mg/dL — ABNORMAL HIGH (ref 8–23)
CO2: 35 mmol/L — ABNORMAL HIGH (ref 22–32)
Calcium: 8.9 mg/dL (ref 8.9–10.3)
Chloride: 97 mmol/L — ABNORMAL LOW (ref 98–111)
Creatinine, Ser: 1.25 mg/dL — ABNORMAL HIGH (ref 0.61–1.24)
GFR calc Af Amer: >60 mL/min (ref >60)
GFR calc non Af Amer: 53 mL/min — ABNORMAL LOW (ref >60)
Glucose, Bld: 112 mg/dL — ABNORMAL HIGH (ref 70–99)
Potassium: 3.7 mmol/L (ref 3.5–5.1)
Sodium: 140 mmol/L (ref 135–145)

## 2020-03-10 LAB — CBC
HCT: 52.2 % — ABNORMAL HIGH (ref 39.0–52.0)
Hemoglobin: 16.4 g/dL (ref 13.0–17.0)
MCH: 31.5 pg (ref 26.0–34.0)
MCHC: 31.4 g/dL (ref 30.0–36.0)
MCV: 100.4 fL — ABNORMAL HIGH (ref 80.0–100.0)
Platelets: 108 10*3/uL — ABNORMAL LOW (ref 150–400)
RBC: 5.2 MIL/uL (ref 4.22–5.81)
RDW: 20 % — ABNORMAL HIGH (ref 11.5–15.5)
WBC: 17 10*3/uL — ABNORMAL HIGH (ref 4.0–10.5)
nRBC: 0 % (ref 0.0–0.2)

## 2020-03-10 MED ORDER — LEVALBUTEROL HCL 0.63 MG/3ML IN NEBU
0.6300 mg | INHALATION_SOLUTION | Freq: Four times a day (QID) | RESPIRATORY_TRACT | Status: DC
  Administered 2020-03-11 (×2): 0.63 mg via RESPIRATORY_TRACT
  Filled 2020-03-10 (×3): qty 3

## 2020-03-10 MED ORDER — ALBUTEROL SULFATE (2.5 MG/3ML) 0.083% IN NEBU: 3.0000 mL | INHALATION_SOLUTION | Freq: Four times a day (QID) | RESPIRATORY_TRACT | Status: DC | PRN

## 2020-03-10 MED ORDER — FUROSEMIDE 10 MG/ML IJ SOLN
20.0000 mg | Freq: Two times a day (BID) | INTRAMUSCULAR | Status: DC
  Administered 2020-03-10 – 2020-03-14 (×8): 20 mg via INTRAVENOUS
  Filled 2020-03-10 (×8): qty 2

## 2020-03-10 MED ORDER — ALBUTEROL SULFATE (2.5 MG/3ML) 0.083% IN NEBU
3.0000 mL | INHALATION_SOLUTION | Freq: Four times a day (QID) | RESPIRATORY_TRACT | Status: DC
  Administered 2020-03-10: 3 mL via RESPIRATORY_TRACT
  Filled 2020-03-10: qty 6

## 2020-03-10 MED ORDER — METOPROLOL SUCCINATE ER 25 MG PO TB24
25.0000 mg | ORAL_TABLET | Freq: Every day | ORAL | Status: DC
  Administered 2020-03-11 – 2020-03-13 (×3): 25 mg via ORAL
  Filled 2020-03-10 (×4): qty 1

## 2020-03-10 MED ORDER — HYDROXYZINE HCL 25 MG PO TABS
25.0000 mg | ORAL_TABLET | Freq: Three times a day (TID) | ORAL | Status: DC | PRN
  Administered 2020-03-10: 25 mg via ORAL
  Filled 2020-03-10: qty 1

## 2020-03-10 MED ORDER — METOPROLOL SUCCINATE ER 50 MG PO TB24: 50.0000 mg | ORAL_TABLET | Freq: Every day | ORAL | Status: DC

## 2020-03-10 NOTE — Plan of Care (Signed)

## 2020-03-10 NOTE — Progress Notes (Signed)
Notified Dr. Welton Flakes of BP of 87/66 MAP 72. States ok and continue to monitor pt. Pt denies any symptoms, alert and oriented at this time.

## 2020-03-10 NOTE — Evaluation (Signed)
Clinical/Bedside Swallow Evaluation Patient Details  Name: Jesus Fowler MRN: [id number] Date of Birth: 07/27/1933  Today's Date: 03/10/2020 Time: SLP Start Time (ACUTE ONLY): 1245 SLP Stop Time (ACUTE ONLY): 1322 SLP Time Calculation (min) (ACUTE ONLY): 37 min  Past Medical History:  Past Medical History:  Diagnosis Date  . A-fib (HCC)   . Cellulitis   . Hemorrhoid   . Hypertension   . Prostate disorder    Past Surgical History: History reviewed. No pertinent surgical history. HPI:  Jesus Fowler is a 86 y.o. male with medical history significant for atrial fibrillation, hypertension.  Patient presented to the ED with complaints of generalized abdominal swelling involving his bilateral legs, abdomen, bilateral upper extremity over the past several weeks. BSE requested due to Pt having difficulty swallowing medication this AM.   Assessment / Plan / Recommendation Clinical Impression  Clinical swallow evaluation completed at bedside. Pt reports feeling SOB and had difficulty inhaling through his nose (has a nasal cannula). Oral motor examination is WNL with the exception of missing some dentition. Pt with strong cough and vocal quality. He is unable to self feed currently due to arm swelling and deconditioning. Pt without overt coughing during po intake of lunch meal, however oral phase is prolonged and inefficient with solids and Pt wanted to be fed very slowly (at least one minute between each bite). Pt reports being a "slow eater" at home, but also acknowledges that it is worse now due to difficulty coordinating respiration and swallow. While Pt can successfully masticate regular textures, it is currently inefficient and Pt gravitated towards the mashed items (sweet potato, ice cream, liquids). Recommend D2 and thin liquids with feeder assist and Pt must be fed slowly. Suspect an element of esophageal dysphagia given very slow intake of po and occasional eructation. SLP will follow  during acute stay. Above discussed with RN. Present po medications whole in puree and follow with liquid wash.   SLP Visit Diagnosis: Dysphagia, unspecified (R13.10)    Aspiration Risk  Mild aspiration risk    Diet Recommendation Dysphagia 2 (Fine chop);Thin liquid   Liquid Administration via: Cup;Straw Medication Administration: Whole meds with puree Supervision: Staff to assist with self feeding;Full supervision/cueing for compensatory strategies Compensations: Slow rate;Small sips/bites Postural Changes: Seated upright at 90 degrees;Remain upright for at least 30 minutes after po intake    Other  Recommendations Oral Care Recommendations: Oral care BID;Staff/trained caregiver to provide oral care Other Recommendations: Clarify dietary restrictions   Follow up Recommendations None      Frequency and Duration min 2x/week  1 week       Prognosis Prognosis for Safe Diet Advancement: Fair Barriers/Prognosis Comment: deconditioning      Swallow Study   General Date of Onset: 03/08/20 HPI: [name] is a 86 y.o. male with medical history significant for atrial fibrillation, hypertension.  Patient presented to the ED with complaints of generalized abdominal swelling involving his bilateral legs, abdomen, bilateral upper extremity over the past several weeks. BSE requested due to Pt having difficulty swallowing medication this AM. Type of Study: Bedside Swallow Evaluation Previous Swallow Assessment: None on record Diet Prior to this Study: Regular;Thin liquids Temperature Spikes Noted: No Respiratory Status: Nasal cannula History of Recent Intubation: No Behavior/Cognition: Alert;Cooperative;Pleasant mood Oral Cavity Assessment: Within Functional Limits Oral Care Completed by SLP: No Oral Cavity - Dentition: Adequate natural dentition;Missing dentition Vision: Impaired for self-feeding Self-Feeding Abilities: Needs assist(due to weakness) Patient Positioning: Upright in  bed Baseline Vocal Quality: Normal Volitional  Cough: Strong Volitional Swallow: Able to elicit    Oral/Motor/Sensory Function Overall Oral Motor/Sensory Function: Within functional limits   Ice Chips Ice chips: Within functional limits Presentation: Spoon   Thin Liquid Thin Liquid: Within functional limits Presentation: Straw;Cup;Spoon    Nectar Thick Nectar Thick Liquid: Not tested   Honey Thick Honey Thick Liquid: Not tested   Puree Puree: Within functional limits Presentation: Spoon   Solid     Solid: Impaired Presentation: Spoon Oral Phase Impairments: Impaired mastication Oral Phase Functional Implications: Prolonged oral transit;Impaired mastication Pharyngeal Phase Impairments: Throat Clearing - Delayed(very slow eater)     Thank you,  Genene Churn, [place]  Rimas Gilham 03/10/2020,1:22 PM

## 2020-03-10 NOTE — Progress Notes (Addendum)
Patient Demographics:    Jesus Fowler, is a 86 y.o. male, DOB - [birth year], ZOX:[id number]  Admit date - 03/08/2020   Admitting Physician Ejiroghene Wendall Stade, MD  Outpatient Primary MD for the patient is [name], MD  LOS - 2   Chief Complaint  Patient presents with  . Leg Swelling        Subjective:    Jesus Fowler today has no fevers, no emesis,  No chest pain---  left arm pain and swelling improving, able to remove his wedding ring from his finger - on 5 L/min  -Compliant with CPAP -swallowing difficulties noted   Assessment  & Plan :    Principal Problem:   Swelling of lower extremity Active Problems:   Atrial fibrillation, chronic (HCC)   Swelling of both lower extremities  Brief Summary -86 y.o. male with medical history significant for atrial fibrillation, hypertension.  Patient presented to the ED with complaints of generalized abdominal swelling involving his bilateral legs, abdomen, bilateral upper extremity over  several weeks PTA  admitted on 03/08/2020 with dyspnea on acute hypoxic respiratory failure and concerns about left upper extremity cellulitis and possible hematoma/ecchymosis  A/p  1) acute hypoxic respiratory failure--- suspect acute on chronic HFpEF/CHF related, BNP is 280,  --Patient is a reformed smoker so cannot exclude some component of COPD -CTA chest w/o acute PE, small right pleural effusion and right-sided atelectasis noted --On further questioning patient was previously on home O2 which was later discontinued... Currently requiring 4 to 5 L of oxygen via nasal cannula -Echo with EF of 60 to 65% -Continue gentle diuresis, daily weights, fluid input and output monitoring, --weight is down to 231.9 pounds from 236.99 -Fluid balance is negative -Covid negative -Blood pressure is somewhat soft, change IV Lasix to 20 mg twice daily from 40 mg  daily -try to wean off oxygen -Continue CPAP with sleep  2)Left upper extremity cellulitis----WBC is down to 17.0 from 21.1, -- PTA patient was treated with Levaquin as outpatient, no fevers, continue IV Rocephin, Blood Cx NGTD -Left upper extremity venous Dopplers without acute DVT  3)Generalized Weakness and Deconditioning -TSH 0.4, PT eval requested  4) chronic Atrial fibrillation- rates 80s to 90s.  Rate controlled and anticoagulated. -Continue Eliquis for stroke prophylaxis and decrease Toprol-XL to 25 mg daily from 50 mg daily due to soft BP   for rate control  5)Prolonged QTC-512.  ---  Keep potassium close to 4 magnesium above 2  6)Elevated bilirubin-   T bil 2.7 to 2.9 (indirect 1.9, direct 0.8),  --LFTs WNL -Asymptomatic at this time outpatient follow-up advised.  7)Hypertension--- BP soft despite holding lisinopril/HCTZ and amlodipine, will decrease Toprol-XL to 25 mg daily from 50 mg daily  8)BPH--- continue Flomax  9)Dysphagia-- Speech eval appreciated, recommends---Dysphagia 2 (Fine chop);Thin liquid   Disposition/Need for in-Hospital Stay- patient unable to be discharged at this time due to --- worsening left upper extremity cellulitis with worsening leukocytosis and the patient to failed outpatient oral Levaquin requiring IV Rocephin, hypoxic respiratory failure requiring IV lasix diuresis and treatment of oxygen -Not medically ready for discharge, will most likely need SNF Rehab  Code Status : Full  Family Communication:   (patient is alert, awake and coherent) Discussed with  son who is visiting from [place]  Consults  :  na DVT Prophylaxis  : eliquis/- SCDs   Lab Results  Component Value Date   PLT 108 (L) 03/10/2020    Inpatient Medications  Scheduled Meds: . apixaban  2.5 mg Oral BID  . erythromycin   Both Eyes Q8H  . furosemide  40 mg Intravenous Daily  . HYDROcodone-acetaminophen  1 tablet Oral QID  . [START ON 03/11/2020] metoprolol succinate  50  mg Oral Daily  . potassium chloride  10 mEq Oral Daily  . rosuvastatin  10 mg Oral q AM  . tamsulosin  0.4 mg Oral Daily   Continuous Infusions: . cefTRIAXone (ROCEPHIN)  IV 1 g (03/09/20 2002)   PRN Meds:.acetaminophen **OR** acetaminophen, albuterol, hydrocortisone cream, ondansetron **OR** ondansetron (ZOFRAN) IV, polyethylene glycol, sodium chloride flush    Anti-infectives (From admission, onward)   Start     Dose/Rate Route Frequency Ordered Stop   03/08/20 2100  cefTRIAXone (ROCEPHIN) 1 g in sodium chloride 0.9 % 100 mL IVPB     1 g 200 mL/hr over 30 Minutes Intravenous Every 24 hours 03/08/20 2006          Objective:   Vitals:   03/10/20 0951 03/10/20 1007 03/10/20 1215 03/10/20 1337  BP:  101/67  105/67  Pulse:  89  90  Resp:  18  15  Temp:  97.8 F (36.6 C)  98 F (36.7 C)  TempSrc:  Oral  Oral  SpO2: 98% 98% 95% 95%  Weight:      Height:        Wt Readings from Last 3 Encounters:  03/10/20 105.2 kg     Intake/Output Summary (Last 24 hours) at 03/10/2020 1443 Last data filed at 03/10/2020 0900 Gross per 24 hour  Intake 340 ml  Output 1050 ml  Net -710 ml   Physical Exam  Gen:- Awake Alert,  In no apparent distress  HEENT:- [place].AT, No sclera icterus Eye--bilateral conjunctivitis with purulent drainage Nose -5L/min Neck-Supple Neck,No JVD,.  Lungs-diminished breath sounds, no wheezing  CV- S1, S2 normal, irregularly irregular  abd-  +ve B.Sounds, Abd Soft, No tenderness,    Extremity/Skin:-  pedal pulses present, left upper extremity swelling, warmth, tenderness and erythema is improving, no open draining wounds, chronic lower extremity edema, chronic venous stasis dermatitis type changes of both lower extremities Psych-affect is appropriate, oriented x3 Neuro-generalized weakness and deconditioning, no new focal deficits, no tremors   Data Review:   Micro Results Recent Results (from the past 240 hour(s))  SARS CORONAVIRUS 2 (TAT 6-24 HRS)  Nasopharyngeal Nasopharyngeal Swab     Status: None   Collection Time: 03/08/20 11:30 PM   Specimen: Nasopharyngeal Swab  Result Value Ref Range Status   SARS Coronavirus 2 NEGATIVE NEGATIVE Final    Comment: (NOTE) SARS-CoV-2 target nucleic acids are NOT DETECTED. The SARS-CoV-2 RNA is generally detectable in upper and lower respiratory specimens during the acute phase of infection. Negative results do not preclude SARS-CoV-2 infection, do not rule out co-infections with other pathogens, and should not be used as the sole basis for treatment or other patient management decisions. Negative results must be combined with clinical observations, patient history, and epidemiological information. The expected result is Negative. Fact Sheet for Patients: HairSlick.nohttps://www.fda.gov/media/138098/download Fact Sheet for Healthcare Providers: quierodirigir.comhttps://www.fda.gov/media/138095/download This test is not yet approved or cleared by the [place] FDA and  has been authorized for detection and/or diagnosis of SARS-CoV-2 by FDA under an Emergency Use Authorization (  EUA). This EUA will remain  in effect (meaning this test can be used) for the duration of the COVID-19 declaration under Section 56 4(b)(1) of the Act, 21 U.S.C. section 360bbb-3(b)(1), unless the authorization is terminated or revoked sooner. Performed at [facility], [address], [place], [place] [address]   Culture, blood (Routine X 2) w Reflex to ID Panel     Status: None (Preliminary result)   Collection Time: 03/09/20  6:25 PM   Specimen: Right Antecubital; Blood  Result Value Ref Range Status   Specimen Description RIGHT ANTECUBITAL  Final   Special Requests   Final    BOTTLES DRAWN AEROBIC AND ANAEROBIC Blood Culture adequate volume   Culture   Final    NO GROWTH < 24 HOURS Performed at [facility], [address] [place], [place] [address]    Report Status PENDING  Incomplete  Culture, blood (Routine X 2) w Reflex to  ID Panel     Status: None (Preliminary result)   Collection Time: 03/09/20  6:25 PM   Specimen: BLOOD RIGHT HAND  Result Value Ref Range Status   Specimen Description BLOOD RIGHT HAND  Final   Special Requests   Final    BOTTLES DRAWN AEROBIC ONLY Blood Culture adequate volume   Culture   Final    NO GROWTH < 24 HOURS Performed at [facility], [address], [place], [place] [address]    Report Status PENDING  Incomplete    Radiology Reports CT ANGIO CHEST PE W OR WO CONTRAST  Result Date: 03/08/2020 CLINICAL DATA:  Hypoxia and peripheral edema EXAM: CT ANGIOGRAPHY CHEST WITH CONTRAST TECHNIQUE: Multidetector CT imaging of the chest was performed using the standard protocol during bolus administration of intravenous contrast. Multiplanar CT image reconstructions and MIPs were obtained to evaluate the vascular anatomy. CONTRAST:  OMNIPAQUE IOHEXOL 350 MG/ML SOLN COMPARISON:  Chest x-ray from earlier in the same day, CT from 04/18/2004. FINDINGS: Cardiovascular: Thoracic aorta and its branches demonstrate atherosclerotic calcifications. No aneurysmal dilatation or dissection is seen. Coronary calcifications are noted. No significant cardiac enlargement is seen. The pulmonary artery shows a normal branching pattern without intraluminal filling defect to suggest pulmonary embolism. Mediastinum/Nodes: Thoracic inlet is within normal limits. No hilar or mediastinal adenopathy is noted. The esophagus as visualized is within normal limits. Lungs/Pleura: Lungs are well aerated bilaterally. A 5 mm nodule is noted in the medial aspect of the left upper lobe best seen on image number 21 of series 6. This has increased in size from the prior exam sixteen years previous at which time it measured 1-2 mm elevation of the right hemidiaphragm is seen. This is stable in appearance from the prior exam. No other nodules are seen. Small right pleural effusion is noted with right basilar atelectasis. Upper Abdomen:  Visualized abdomen shows no acute abnormality. Musculoskeletal: Changes of anasarca are noted peripherally consistent with that described in the lower extremities. Degenerative changes of the thoracic spine are seen. No acute compression deformity is noted. Review of the MIP images confirms the above findings. IMPRESSION: No evidence of pulmonary emboli. Small right pleural effusion with right basilar atelectasis. 5 mm nodule in the left upper lobe increased in size from 16 years ago at which time it measured 1-2 mm. No follow-up needed if patient is low-risk. Non-contrast chest CT can be considered in 12 months if patient is high-risk. This recommendation follows the consensus statement: Guidelines for Management of Incidental Pulmonary Nodules Detected on CT  Images: From the Fleischner Society 2017; Radiology 2017; [phone]. Changes of anasarca. Aortic Atherosclerosis (ICD10-I70.0). Electronically Signed   By: Inez Catalina M.D.   On: 03/08/2020 20:33   US Venous Img Upper Uni Left (DVT)  Result Date: 03/09/2020 CLINICAL DATA:  Left upper extremity edema.  Evaluate for DVT. EXAM: LEFT UPPER EXTREMITY VENOUS DOPPLER ULTRASOUND TECHNIQUE: Gray-scale sonography with graded compression, as well as color Doppler and duplex ultrasound were performed to evaluate the upper extremity deep venous system from the level of the subclavian vein and including the jugular, axillary, basilic, radial, ulnar and upper cephalic vein. Spectral Doppler was utilized to evaluate flow at rest and with distal augmentation maneuvers. COMPARISON:  None. FINDINGS: Contralateral Subclavian Vein: Respiratory phasicity is normal and symmetric with the symptomatic side. No evidence of thrombus. Normal compressibility. Internal Jugular Vein: No evidence of thrombus. Normal compressibility, respiratory phasicity and response to augmentation. Subclavian Vein: No evidence of thrombus. Normal compressibility, respiratory phasicity and response  to augmentation. Axillary Vein: No evidence of thrombus. Normal compressibility, respiratory phasicity and response to augmentation. Cephalic Vein: No evidence of thrombus. Normal compressibility, respiratory phasicity and response to augmentation. Basilic Vein: No evidence of thrombus. Normal compressibility, respiratory phasicity and response to augmentation. Brachial Veins: No evidence of thrombus. Normal compressibility, respiratory phasicity and response to augmentation. Radial Veins: No evidence of thrombus. Normal compressibility, respiratory phasicity and response to augmentation. Ulnar Veins: No evidence of thrombus. Normal compressibility, respiratory phasicity and response to augmentation. Venous Reflux:  None visualized. Other Findings: There is a moderate amount of subcutaneous edema at the level of the left arm and forearm. IMPRESSION: No evidence of DVT within the left upper extremity. Electronically Signed   By: Sandi Mariscal M.D.   On: 03/09/2020 16:16   DG Chest Portable 1 View  Result Date: 03/08/2020 CLINICAL DATA:  Anasarca EXAM: PORTABLE CHEST 1 VIEW COMPARISON:  2018 FINDINGS: Stable elevation of the right hemidiaphragm. No new consolidation or edema. No significant pleural effusion. Stable cardiomediastinal contours. IMPRESSION: Stable significant elevation of the right hemidiaphragm. No acute finding. Electronically Signed   By: Macy Mis M.D.   On: 03/08/2020 16:30   ECHOCARDIOGRAM COMPLETE  Result Date: 03/09/2020    ECHOCARDIOGRAM REPORT   Patient Name:   KHADAR MONGER Date of Exam: 03/09/2020 Medical Rec #:  [id number]       Height:       66.0 in Accession #:    [id number]      Weight:       237.0 lb Date of Birth:  [birth year]      BSA:          2.149 m Patient Age:    38 years        BP:           106/83 mmHg Patient Gender: M               HR:           97 bpm. Exam Location:  [facility] Procedure: 2D Echo Indications:    Swelling of both lower extremities  History:         Patient has no prior history of Echocardiogram examinations.                 Arrythmias:Atrial Fibrillation; Risk Factors:Former Smoker and                 Hypertension. Cellulitis.  Sonographer:    Leavy Cella RDCS (AE) Referring Phys: [phone]  EJIROGHENE E Lucien Budney IMPRESSIONS  1. Left ventricular ejection fraction, by estimation, is 60 to 65%. The left ventricle has normal function. The left ventricle has no regional wall motion abnormalities. There is mild concentric left ventricular hypertrophy. Left ventricular diastolic parameters were normal. There is the interventricular septum is flattened in systole, consistent with right ventricular pressure overload.  2. Right ventricular systolic function is mildly reduced. The right ventricular size is mildly enlarged. There is moderately elevated pulmonary artery systolic pressure.  3. Right atrial size was mildly dilated.  4. The mitral valve is degenerative. Mild mitral valve regurgitation.  5. Tricuspid valve regurgitation is moderate.  6. The aortic valve is tricuspid. Aortic valve regurgitation is not visualized. Mild aortic valve sclerosis is present, with no evidence of aortic valve stenosis.  7. The inferior vena cava is dilated in size with <50% respiratory variability, suggesting right atrial pressure of 15 mmHg. FINDINGS  Left Ventricle: Left ventricular ejection fraction, by estimation, is 60 to 65%. The left ventricle has normal function. The left ventricle has no regional wall motion abnormalities. The left ventricular internal cavity size was normal in size. There is  mild concentric left ventricular hypertrophy. The interventricular septum is flattened in systole, consistent with right ventricular pressure overload. Left ventricular diastolic parameters were normal. Right Ventricle: The right ventricular size is mildly enlarged. No increase in right ventricular wall thickness. Right ventricular systolic function is mildly reduced. There is moderately  elevated pulmonary artery systolic pressure. The tricuspid regurgitant velocity is 2.96 m/s, and with an assumed right atrial pressure of 15 mmHg, the estimated right ventricular systolic pressure is 50.0 mmHg. Left Atrium: Left atrial size was normal in size. Right Atrium: Right atrial size was mildly dilated. Pericardium: There is no evidence of pericardial effusion. Mitral Valve: The mitral valve is degenerative in appearance. There is mild thickening of the mitral valve leaflet(s). Moderate mitral annular calcification. Mild mitral valve regurgitation. Tricuspid Valve: The tricuspid valve is grossly normal. Tricuspid valve regurgitation is moderate. Aortic Valve: The aortic valve is tricuspid. . There is mild thickening and mild calcification of the aortic valve. Aortic valve regurgitation is not visualized. Mild aortic valve sclerosis is present, with no evidence of aortic valve stenosis. Mild to moderate aortic valve annular calcification. There is mild thickening of the aortic valve. There is mild calcification of the aortic valve. Pulmonic Valve: The pulmonic valve was grossly normal. Pulmonic valve regurgitation is mild. Aorta: The aortic root is normal in size and structure. Venous: The inferior vena cava is dilated in size with less than 50% respiratory variability, suggesting right atrial pressure of 15 mmHg. IAS/Shunts: The interatrial septum was not well visualized.  LEFT VENTRICLE PLAX 2D LVIDd:         4.19 cm  Diastology LVIDs:         2.49 cm  LV e' lateral:   13.60 cm/s LV PW:         1.29 cm  LV E/e' lateral: 8.0 LV IVS:        1.12 cm  LV e' medial:    10.10 cm/s LVOT diam:     2.00 cm  LV E/e' medial:  10.8 LVOT Area:     3.14 cm  RIGHT VENTRICLE TAPSE (M-mode): 1.3 cm LEFT ATRIUM             Index       RIGHT ATRIUM           Index LA diam:  5.00 cm 2.33 cm/m  RA Area:     22.40 cm LA Vol (A2C):   48.2 ml 22.43 ml/m RA Volume:   63.50 ml  29.54 ml/m LA Vol (A4C):   44.5 ml 20.70  ml/m LA Biplane Vol: 46.6 ml 21.68 ml/m   AORTA Ao Root diam: 2.90 cm MITRAL VALVE                TRICUSPID VALVE MV Area (PHT): 3.58 cm     TR Peak grad:   35.0 mmHg MV Decel Time: 212 msec     TR Vmax:        296.00 cm/s MR Peak grad: 46.2 mmHg MR Mean grad: 25.0 mmHg     SHUNTS MR Vmax:      340.00 cm/s   Systemic Diam: 2.00 cm MR Vmean:     236.0 cm/s MV E velocity: 108.67 cm/s Prentice Docker MD Electronically signed by Prentice Docker MD Signature Date/Time: 03/09/2020/12:59:08 PM    Final      CBC Recent Labs  Lab 03/08/20 1535 03/09/20 0554 03/10/20 1028  WBC 18.9* 21.1* 17.0*  HGB 16.7 15.7 16.4  HCT 51.1 48.5 52.2*  PLT 143* 139* 108*  MCV 98.1 98.0 100.4*  MCH 32.1 31.7 31.5  MCHC 32.7 32.4 31.4  RDW 19.8* 19.6* 20.0*  LYMPHSABS 0.8  --   --   MONOABS 1.3*  --   --   EOSABS 0.0  --   --   BASOSABS 0.0  --   --     Chemistries  Recent Labs  Lab 03/08/20 1535 03/08/20 1758 03/09/20 0554 03/10/20 1028  NA 140  --  140 140  K 3.7  --  4.2 3.7  CL 99  --  101 97*  CO2 31  --  31 35*  GLUCOSE 100*  --  76 112*  BUN 29*  --  29* 29*  CREATININE 1.28*  --  1.24 1.25*  CALCIUM 9.4  --  9.1 8.9  MG  --  2.5*  --   --   AST 37  --  30  --   ALT 30  --  27  --   ALKPHOS 78  --  72  --   BILITOT 2.9* 2.7* 2.2*  --    ------------------------------------------------------------------------------------------------------------------ No results for input(s): CHOL, HDL, LDLCALC, TRIG, CHOLHDL, LDLDIRECT in the last 72 hours.  No results found for: HGBA1C ------------------------------------------------------------------------------------------------------------------ Recent Labs    03/08/20 1535  TSH 0.492   ------------------------------------------------------------------------------------------------------------------ No results for input(s): VITAMINB12, FOLATE, FERRITIN, TIBC, IRON, RETICCTPCT in the last 72 hours.  Coagulation profile No results for  input(s): INR, PROTIME in the last 168 hours.  No results for input(s): DDIMER in the last 72 hours.  Cardiac Enzymes No results for input(s): CKMB, TROPONINI, MYOGLOBIN in the last 168 hours.  Invalid input(s): CK ------------------------------------------------------------------------------------------------------------------    Component Value Date/Time   BNP 280.0 (H) 03/08/2020 1535     Shon Hale M.D on 03/10/2020 at 2:43 PM  Go to [email or web address] - for contact info  [facility] - Office  [phone]

## 2020-03-10 NOTE — Progress Notes (Addendum)
Patient did not complete his 10:00 meds. Patient is having trouble swallowing his meds.  He says it feels like the pills are getting stuck in his throat.    Patient also coughs after eating and has a wet throat sound.  SLP consult ordered.

## 2020-03-11 LAB — CBC
HCT: 50.8 % (ref 39.0–52.0)
Hemoglobin: 16 g/dL (ref 13.0–17.0)
MCH: 31.7 pg (ref 26.0–34.0)
MCHC: 31.5 g/dL (ref 30.0–36.0)
MCV: 100.6 fL — ABNORMAL HIGH (ref 80.0–100.0)
Platelets: 100 10*3/uL — ABNORMAL LOW (ref 150–400)
RBC: 5.05 MIL/uL (ref 4.22–5.81)
RDW: 19.6 % — ABNORMAL HIGH (ref 11.5–15.5)
WBC: 16.4 10*3/uL — ABNORMAL HIGH (ref 4.0–10.5)
nRBC: 0 % (ref 0.0–0.2)

## 2020-03-11 LAB — MAGNESIUM: Magnesium: 2.3 mg/dL (ref 1.7–2.4)

## 2020-03-11 LAB — BASIC METABOLIC PANEL
Anion gap: 10 (ref 5–15)
BUN: 26 mg/dL — ABNORMAL HIGH (ref 8–23)
CO2: 36 mmol/L — ABNORMAL HIGH (ref 22–32)
Calcium: 8.5 mg/dL — ABNORMAL LOW (ref 8.9–10.3)
Chloride: 96 mmol/L — ABNORMAL LOW (ref 98–111)
Creatinine, Ser: 1.14 mg/dL (ref 0.61–1.24)
GFR calc Af Amer: >60 mL/min (ref >60)
GFR calc non Af Amer: 59 mL/min — ABNORMAL LOW (ref >60)
Glucose, Bld: 86 mg/dL (ref 70–99)
Potassium: 3.6 mmol/L (ref 3.5–5.1)
Sodium: 142 mmol/L (ref 135–145)

## 2020-03-11 MED ORDER — LEVALBUTEROL HCL 0.63 MG/3ML IN NEBU
0.6300 mg | INHALATION_SOLUTION | Freq: Four times a day (QID) | RESPIRATORY_TRACT | Status: DC | PRN
  Administered 2020-03-11: 0.63 mg via RESPIRATORY_TRACT
  Filled 2020-03-11: qty 3

## 2020-03-11 NOTE — Care Management Important Message (Signed)
Important Message  Patient Details  Name: Jesus Fowler MRN: [id number] Date of Birth: 05/21/1933   Medicare Important Message Given:  Yes     Corey Harold 03/11/2020, 2:12 PM

## 2020-03-11 NOTE — Evaluation (Signed)
Physical Therapy Evaluation Patient Details Name: Jesus Fowler MRN: [id number] DOB: [birth year] Today's Date: 03/11/2020   History of Present Illness  Jesus Fowler is a 86 y.o. male with medical history significant for atrial fibrillation, hypertension.  Patient presented to the ED with complaints of generalized abdominal swelling involving his bilateral legs, abdomen, bilateral upper extremity over the past several weeks.  Not exactly sure but he tells me his baseline weight is about 198 pounds.He also had some redness to his right upper extremity, he is unable to tell me how long this has been going on, he tells me he noticed this when he was told today in the ED. Mild occasional itching, but no pain, no redness.  Was prescribed an antibiotic for this yesterday which he took. He reports a very mild chronic unchanged cough, mostly in the mornings.     Clinical Impression  Patient limited for functional mobility as stated below secondary to BLE weakness, fatigue and fair sitting balance. Patient requests using his CPAP throughout session and O2 sat was monitored at 99%. Patient transitions to seated EOB requiring mod assist and verbal cueing for sequencing. Upon seated, he intitially requires assist for balance until he is able to support himself with bilateral UE use. Patient tolerates sitting EOB well but is apprehensive to transfer to standing and ambulation secondary to fatigue and SOB. Patient will benefit from continued physical therapy in hospital and recommended venue below to increase strength, balance, endurance for safe ADLs and gait.     Follow Up Recommendations SNF    Equipment Recommendations  None recommended by PT    Recommendations for Other Services       Precautions / Restrictions Precautions Precautions: Fall Restrictions Weight Bearing Restrictions: No      Mobility  Bed Mobility Overal bed mobility: Needs Assistance Bed Mobility: Supine to Sit      Supine to sit: Mod assist;HOB elevated     General bed mobility comments: to transition to seated EOB  Transfers                    Ambulation/Gait                Stairs            Wheelchair Mobility    Modified Rankin (Stroke Patients Only)       Balance Overall balance assessment: Needs assistance Sitting-balance support: Feet supported;Bilateral upper extremity supported Sitting balance-Leahy Scale: Fair Sitting balance - Comments: seated EOB                                     Pertinent Vitals/Pain Pain Assessment: No/denies pain    Home Living Family/patient expects to be discharged to:: Private residence Living Arrangements: Spouse/significant other Available Help at Discharge: Family;Available 24 hours/day Type of Home: House Home Access: Stairs to enter Entrance Stairs-Rails: Right Entrance Stairs-Number of Steps: 3 Home Layout: One level Home Equipment: Walker - 2 wheels;Cane - single point;Shower seat      Prior Function Level of Independence: Needs assistance   Gait / Transfers Assistance Needed: patient states household ambulator with SPC or RW  ADL's / Homemaking Assistance Needed: wife assists        Hand Dominance        Extremity/Trunk Assessment   Upper Extremity Assessment Upper Extremity Assessment: Generalized weakness    Lower Extremity Assessment Lower Extremity Assessment: Generalized weakness  Communication   Communication: No difficulties  Cognition Arousal/Alertness: Awake/alert Behavior During Therapy: WFL for tasks assessed/performed Overall Cognitive Status: Within Functional Limits for tasks assessed                                        General Comments      Exercises     Assessment/Plan    PT Assessment Patient needs continued PT services  PT Problem List Decreased strength;Decreased activity tolerance;Decreased balance;Decreased  mobility;Decreased knowledge of use of DME       PT Treatment Interventions DME instruction;Gait training;Stair training;Functional mobility training;Therapeutic activities;Therapeutic exercise;Balance training;Neuromuscular re-education;Patient/family education    PT Goals (Current goals can be found in the Care Plan section)  Acute Rehab PT Goals Patient Stated Goal: Return home PT Goal Formulation: With patient Time For Goal Achievement: 03/25/20 Potential to Achieve Goals: Fair    Frequency Min 3X/week   Barriers to discharge        Co-evaluation               AM-PAC PT "6 Clicks" Mobility  Outcome Measure Help needed turning from your back to your side while in a flat bed without using bedrails?: A Little Help needed moving from lying on your back to sitting on the side of a flat bed without using bedrails?: A Little Help needed moving to and from a bed to a chair (including a wheelchair)?: A Lot Help needed standing up from a chair using your arms (e.g., wheelchair or bedside chair)?: A Lot Help needed to walk in hospital room?: A Lot Help needed climbing 3-5 steps with a railing? : A Lot 6 Click Score: 14    End of Session Equipment Utilized During Treatment: Oxygen Activity Tolerance: Patient limited by fatigue Patient left: in bed;with call bell/phone within reach;with nursing/sitter in room Nurse Communication: Mobility status PT Visit Diagnosis: Unsteadiness on feet (R26.81);Other abnormalities of gait and mobility (R26.89);Muscle weakness (generalized) (M62.81)    Time: 0355-9741 PT Time Calculation (min) (ACUTE ONLY): 32 min   Charges:   PT Evaluation $PT Eval Moderate Complexity: 1 Mod PT Treatments $Therapeutic Activity: 23-37 mins        8:45 AM, 03/11/20 Wyman Songster PT, DPT Physical Therapist at [facility]

## 2020-03-11 NOTE — Progress Notes (Signed)
  Speech Language Pathology Treatment: Dysphagia  Patient Details Name: Jesus Fowler MRN: [id number] DOB: [birth year] Today's Date: 03/11/2020 Time: 0017-4944 SLP Time Calculation (min) (ACUTE ONLY): 32 min  Assessment / Plan / Recommendation Clinical Impression  SLP provided ongoing diagnostic dysphagia therapy targeting trials of D3/soft textures, pudding and thin liquids. Pt consumed part of a banana, a cup of pudding and thin liquids.  Note prolonged mastication and prolonged AP transit time and continue to note VERY slow consumption time and Pt requested to be fed rather than to self-feed. Pt seemed to require additional time in between bites for breathing; question efficient coordination of respiration and swallowing and echo questioning an element of esophageal dysphagia with continued noted occasional eructation and significant time required in between bolus presentations. Pt benefits from liquid wash during PO consumption. ST will continue to follow.   HPI HPI: Nadav Swindell is a 86 y.o. male with medical history significant for atrial fibrillation, hypertension.  Patient presented to the ED with complaints of generalized abdominal swelling involving his bilateral legs, abdomen, bilateral upper extremity over the past several weeks. BSE requested due to Pt having difficulty swallowing medication this AM.      SLP Plan  Continue with current plan of care       Recommendations  Diet recommendations: Dysphagia 2 (fine chop);Thin liquid Liquids provided via: Cup;Straw Medication Administration: Whole meds with puree Supervision: Staff to assist with self feeding;Full supervision/cueing for compensatory strategies Compensations: Slow rate;Small sips/bites Postural Changes and/or Swallow Maneuvers: Seated upright 90 degrees;Upright 30-60 min after meal                Oral Care Recommendations: Oral care BID;Staff/trained caregiver to provide oral care Follow up  Recommendations: None SLP Visit Diagnosis: Dysphagia, unspecified (R13.10) Plan: Continue with current plan of care       Tavarion Babington H. Romie Levee, CCC-SLP Speech Language Pathologist   Georgetta Haber 03/11/2020, 11:57 AM

## 2020-03-11 NOTE — [place] FL2 (Signed)
[place]ICA[place] FL2 LEVEL OF CARE SCREENING TOOL     IDENTIFICATION  Patient Name: Jesus Fowler Birthdate: Feb 07, 1933 Sex: male Admission Date (Current Location): 03/08/2020  [facility] and [place] Number:  [employer] and Address:  [facility],  [address], [place] [address]      Provider Number: [id number]  Attending Physician Name and Address:  Shon Hale, MD  Relative Name and Phone Number:       Current Level of Care: Hospital Recommended Level of Care: Skilled Nursing Facility Prior Approval Number:    Date Approved/Denied:   PASRR Number:    Discharge Plan: SNF    Current Diagnoses: Patient Active Problem List   Diagnosis Date Noted  . Swelling of lower extremity 03/08/2020  . Atrial fibrillation, chronic (HCC) 03/08/2020  . Swelling of both lower extremities 03/08/2020    Orientation RESPIRATION BLADDER Height & Weight     Self, Time, Situation, Place  O2(see dc summary) Continent Weight: 231 lb 14.8 oz (105.2 kg) Height:  5\' 6"  (167.6 cm)  BEHAVIORAL SYMPTOMS/MOOD NEUROLOGICAL BOWEL NUTRITION STATUS      Continent Diet(see dc summary)  AMBULATORY STATUS COMMUNICATION OF NEEDS Skin   Extensive Assist Verbally Normal                       Personal Care Assistance Level of Assistance  Bathing, Feeding, Dressing Bathing Assistance: Limited assistance Feeding assistance: Independent Dressing Assistance: Limited assistance     Functional Limitations Info  Sight, Hearing, Speech Sight Info: Adequate Hearing Info: Adequate Speech Info: Adequate    SPECIAL CARE FACTORS FREQUENCY  PT (By licensed PT), OT (By licensed OT)     PT Frequency: 5 times weekly OT Frequency: 3 times weekly            Contractures Contractures Info: Not present    Additional Factors Info  Code Status, Allergies Code Status Info: Full Allergies Info: NKA           Current Medications (03/11/2020):  This is the current  hospital active medication list Current Facility-Administered Medications  Medication Dose Route Frequency Provider Last Rate Last Admin  . acetaminophen (TYLENOL) tablet 650 mg  650 mg Oral Q6H PRN Emokpae, Ejiroghene E, MD       Or  . acetaminophen (TYLENOL) suppository 650 mg  650 mg Rectal Q6H PRN Emokpae, Ejiroghene E, MD      . albuterol (PROVENTIL) (2.5 MG/3ML) 0.083% nebulizer solution 3 mL  3 mL Inhalation Q6H PRN Emokpae, Courage, MD      . apixaban (ELIQUIS) tablet 2.5 mg  2.5 mg Oral BID Emokpae, Ejiroghene E, MD   2.5 mg at 03/11/20 0825  . cefTRIAXone (ROCEPHIN) 1 g in sodium chloride 0.9 % 100 mL IVPB  1 g Intravenous Q24H Emokpae, Ejiroghene E, MD 200 mL/hr at 03/10/20 2027 1 g at 03/10/20 2027  . erythromycin ophthalmic ointment   Both Eyes Q8H Emokpae, Ejiroghene E, MD   Given at 03/11/20 [phone]  . furosemide (LASIX) injection 20 mg  20 mg Intravenous BID 7062, MD   20 mg at 03/11/20 0824  . HYDROcodone-acetaminophen (NORCO/VICODIN) 5-325 MG per tablet 1 tablet  1 tablet Oral QID Emokpae, Ejiroghene E, MD   1 tablet at 03/09/20 2216  . hydrocortisone cream 1 % 1 application  1 application Topical TID PRN Emokpae, Ejiroghene E, MD      . hydrOXYzine (ATARAX/VISTARIL) tablet 25 mg  25 mg Oral TID  PRN Lang Snow, FNP   25 mg at 03/10/20 2141  . levalbuterol (XOPENEX) nebulizer solution 0.63 mg  0.63 mg Nebulization Q6H WA Emokpae, Courage, MD   0.63 mg at 03/11/20 0744  . metoprolol succinate (TOPROL-XL) 24 hr tablet 25 mg  25 mg Oral Daily Emokpae, Courage, MD   25 mg at 03/11/20 0828  . ondansetron (ZOFRAN) tablet 4 mg  4 mg Oral Q6H PRN Emokpae, Ejiroghene E, MD       Or  . ondansetron (ZOFRAN) injection 4 mg  4 mg Intravenous Q6H PRN Emokpae, Ejiroghene E, MD      . polyethylene glycol (MIRALAX / GLYCOLAX) packet 17 g  17 g Oral Daily PRN Emokpae, Ejiroghene E, MD   17 g at 03/10/20 1745  . potassium chloride (KLOR-CON) CR tablet 10 mEq  10 mEq Oral Daily Emokpae,  Ejiroghene E, MD   10 mEq at 03/11/20 0825  . rosuvastatin (CRESTOR) tablet 10 mg  10 mg Oral q AM Emokpae, Ejiroghene E, MD   10 mg at 03/11/20 2706  . sodium chloride flush (NS) 0.9 % injection 3 mL  3 mL Intravenous PRN Emokpae, Ejiroghene E, MD   3 mL at 03/09/20 2001  . tamsulosin (FLOMAX) capsule 0.4 mg  0.4 mg Oral Daily Emokpae, Ejiroghene E, MD   0.4 mg at 03/11/20 0825     Discharge Medications: Please see discharge summary for a list of discharge medications.  Relevant Imaging Results:  Relevant Lab Results:   Additional Information SSN: [place], LCSW

## 2020-03-11 NOTE — TOC Initial Note (Signed)
Transition of Care [facility]) - Initial/Assessment Note    Patient Details  Name: Jesus Fowler MRN: [id number] Date of Birth: Aug 22, 1933  Transition of Care [facility]) CM/SW Contact:    Shade Flood, LCSW Phone Number: 03/11/2020, 1:51 PM  Clinical Narrative:                  Pt admitted from home. PT recommending SNF rehab at dc. Met with pt, pt's wife, and pt's son at bedside to discuss. Pt agreeable to SNF rehab. Provided SNF choice options and will refer as requested.   Expected Discharge Plan: Skilled Nursing Facility Barriers to Discharge: Continued Medical Work up   Patient Goals and CMS Choice Patient states their goals for this hospitalization and ongoing recovery are:: feel better CMS Medicare.gov Compare Post Acute Care list provided to:: Patient Represenative (must comment) Choice offered to / list presented to : Spouse  Expected Discharge Plan and Services Expected Discharge Plan: [place] In-house Referral: Clinical Social Work   Post Acute Care Choice: [place] Living arrangements for the past 2 months: [place]                                      Prior Living Arrangements/Services Living arrangements for the past 2 months: Single Family Home Lives with:: Spouse Patient language and need for interpreter reviewed:: Yes Do you feel safe going back to the place where you live?: Yes      Need for Family Participation in Patient Care: No (Comment) Care giver support system in place?: Yes (comment)   Criminal Activity/Legal Involvement Pertinent to Current Situation/Hospitalization: No - Comment as needed  Activities of Daily Living Home Assistive Devices/Equipment: Cane (specify quad or straight), Walker (specify type), CPAP ADL Screening (condition at time of admission) Patient's cognitive ability adequate to safely complete daily activities?: Yes Is the patient deaf or have difficulty hearing?: No Does the  patient have difficulty seeing, even when wearing glasses/contacts?: No Does the patient have difficulty concentrating, remembering, or making decisions?: No Patient able to express need for assistance with ADLs?: Yes Does the patient have difficulty dressing or bathing?: No Independently performs ADLs?: No Communication: Independent Dressing (OT): Needs assistance Is this a change from baseline?: Pre-admission baseline Grooming: Needs assistance Is this a change from baseline?: Pre-admission baseline Feeding: Independent Bathing: Needs assistance Is this a change from baseline?: Pre-admission baseline Toileting: Independent In/Out Bed: Independent Walks in Home: Independent with device (comment) Does the patient have difficulty walking or climbing stairs?: Yes Weakness of Legs: Both Weakness of Arms/Hands: None  Permission Sought/Granted Permission sought to share information with : Chartered certified accountant granted to share information with : Yes, Verbal Permission Granted     Permission granted to share info w AGENCY: local snfs        Emotional Assessment Appearance:: Appears stated age Attitude/Demeanor/Rapport: Engaged Affect (typically observed): Pleasant Orientation: : Oriented to Self, Oriented to Place, Oriented to  Time, Oriented to Situation Alcohol / Substance Use: Not Applicable Psych Involvement: No (comment)  Admission diagnosis:  Anasarca [R60.1] Swelling of both lower extremities [M79.89] Patient Active Problem List   Diagnosis Date Noted  . Swelling of lower extremity 03/08/2020  . Atrial fibrillation, chronic ([place]) 03/08/2020  . Swelling of both lower extremities 03/08/2020   PCP:  Lucia Gaskins, MD Pharmacy:   [place], [place]  [place]  [address] Phone: [phone] Fax: [phone]     Social Determinants of Health (SDOH) Interventions    Readmission Risk  Interventions Readmission Risk Prevention Plan 03/11/2020  Medication Screening Complete  Transportation Screening Complete  Some recent data might be hidden

## 2020-03-11 NOTE — Progress Notes (Signed)
   Vital Signs MEWS/VS Documentation      03/11/2020 1327 03/11/2020 1329 03/11/2020 1331 03/11/2020 1357   MEWS Score:  --  --  2  1   MEWS Score Color:  --  --  Yellow  Green   Resp:  --  --  (!) 21  --   Pulse:  --  --  (!) 48  82   BP:  --  --  102/65  --   Temp:  --  --  99 F (37.2 C)  --   O2 Device:  CPAP  Nasal Cannula  Nasal Cannula  Nasal Cannula   O2 Flow Rate (L/min):  2 L/min  2 L/min  2 L/min  2 L/min           [name] 03/11/2020,1:59 PM  Patient sitting up in bed eating lunch, telemetry monitor showing atrial fibrillation.

## 2020-03-11 NOTE — Progress Notes (Signed)
Patient Demographics:    Jesus Fowler, is a 86 y.o. male, DOB - 05-30-1933, ZOX:[id number]  Admit date - 03/08/2020   Admitting Physician Ejiroghene Wendall StadeE Elisa Kutner, MD  Outpatient Primary MD for the patient is [name], Richard, MD  LOS - 3   Chief Complaint  Patient presents with   Leg Swelling        Subjective:    Jesus Fowler today has no fevers, no emesis,  No chest pain---  left arm pain and swelling improving,   -Said he felt better after using CPAP overnight -   Assessment  & Plan :    Principal Problem:   Swelling of lower extremity Active Problems:   Atrial fibrillation, chronic (HCC)   Swelling of both lower extremities  Brief Summary -86 y.o. male with medical history significant for atrial fibrillation, hypertension.  Patient presented to the ED with complaints of generalized abdominal swelling involving his bilateral legs, abdomen, bilateral upper extremity over  several weeks PTA  admitted on 03/08/2020 with dyspnea on acute hypoxic respiratory failure and concerns about left upper extremity cellulitis and possible hematoma/ecchymosis  A/p  1)Acute Hypoxic Respiratory Failure--- suspect acute on chronic HFpEF/CHF related, BNP is 280,  --Patient is a reformed smoker so cannot exclude some component of COPD -CTA chest w/o acute PE, small right pleural effusion and right-sided atelectasis noted --... Currently requiring 2 L of oxygen via nasal cannula -Echo with EF of 60 to 65% -Continue gentle diuresis, daily weights, fluid input and output monitoring.   --weight is down to 231.9 pounds from 236.99 -Fluid balance is negative -Covid Negative -Blood pressure is somewhat soft, continue IV Lasix  20 mg twice daily down from 40 mg daily -try to wean off oxygen -Continue CPAP with sleep  2)Left upper extremity cellulitis----WBC is down to 17.0 from 21.1, -- PTA patient was treated  with Levaquin as outpatient, no fevers, continue IV Rocephin, Blood Cx NGTD -Left upper extremity venous Dopplers without acute DVT  3)Generalized Weakness and Deconditioning -TSH 0.4, PT eval requested  4) chronic Atrial fibrillation- rates 80s to 90s.  Rate controlled and anticoagulated. -Continue Eliquis for stroke prophylaxis and decrease Toprol-XL to 25 mg daily from 50 mg daily due to soft BP   for rate control  5)Prolonged QTC- 457  ---  Keep potassium close to 4 magnesium above 2  6)Elevated bilirubin-   T bil 2.7 to 2.9 (indirect 1.9, direct 0.8),  --LFTs WNL -Asymptomatic at this time outpatient follow-up advised.  7)Hypertension--- BP soft despite holding lisinopril/HCTZ and amlodipine, will decrease Toprol-XL to 25 mg daily from 50 mg daily  8)BPH--- continue Flomax  9)Dysphagia-- Speech eval appreciated, recommends---Dysphagia 2 (Fine chop);Thin liquid, Pt benefits from liquid wash during PO consumption.   Disposition/Need for in-Hospital Stay- patient unable to be discharged at this time due to --- left upper extremity cellulitis with  leukocytosis and the patient to failed outpatient oral Levaquin requiring IV Rocephin, hypoxic respiratory failure requiring IV lasix diuresis and oxygen -Not medically ready for discharge, will most likely need SNF Rehab hopefully in 24 to 48 hours  Code Status : Full  Family Communication:   (patient is alert, awake and coherent) Discussed with son who is visiting from [place]  Consults  :  na DVT Prophylaxis  : eliquis/- SCDs   Lab Results  Component Value Date   PLT 100 (L) 03/11/2020    Inpatient Medications  Scheduled Meds:  apixaban  2.5 mg Oral BID   erythromycin   Both Eyes Q8H   furosemide  20 mg Intravenous BID   HYDROcodone-acetaminophen  1 tablet Oral QID   levalbuterol  0.63 mg Nebulization Q6H WA   metoprolol succinate  25 mg Oral Daily   potassium chloride  10 mEq Oral Daily   rosuvastatin  10 mg Oral q  AM   tamsulosin  0.4 mg Oral Daily   Continuous Infusions:  cefTRIAXone (ROCEPHIN)  IV 1 g (03/10/20 2027)   PRN Meds:.acetaminophen **OR** acetaminophen, albuterol, hydrocortisone cream, hydrOXYzine, ondansetron **OR** ondansetron (ZOFRAN) IV, polyethylene glycol, sodium chloride flush    Anti-infectives (From admission, onward)   Start     Dose/Rate Route Frequency Ordered Stop   03/08/20 2100  cefTRIAXone (ROCEPHIN) 1 g in sodium chloride 0.9 % 100 mL IVPB     1 g 200 mL/hr over 30 Minutes Intravenous Every 24 hours 03/08/20 2006          Objective:   Vitals:   03/11/20 0824 03/11/20 1327 03/11/20 1331 03/11/20 1357  BP: 120/77  102/65   Pulse: (!) 112  (!) 48 82  Resp:   (!) 21   Temp:   99 F (37.2 C)   TempSrc:   Oral   SpO2:  98% 98%   Weight:      Height:        Wt Readings from Last 3 Encounters:  03/10/20 105.2 kg     Intake/Output Summary (Last 24 hours) at 03/11/2020 1800 Last data filed at 03/11/2020 1752 Gross per 24 hour  Intake 360 ml  Output 650 ml  Net -290 ml   Physical Exam  Gen:- Awake Alert,  In no apparent distress  HEENT:- [place].AT, No sclera icterus Eye--bilateral conjunctivitis with purulent drainage Nose -2L/min Neck-Supple Neck,No JVD,.  Lungs-improving air movement, no wheezing  CV- S1, S2 normal, irregularly irregular  abd-  +ve B.Sounds, Abd Soft, No tenderness,    Extremity/Skin:-  pedal pulses present, left upper extremity swelling, warmth, tenderness and erythema is improving, no open draining wounds, chronic lower extremity edema, chronic venous stasis dermatitis type changes of both lower extremities Psych-affect is appropriate, oriented x3 Neuro-generalized weakness and deconditioning, no new focal deficits, no tremors   Data Review:   Micro Results Recent Results (from the past 240 hour(s))  SARS CORONAVIRUS 2 (TAT 6-24 HRS) Nasopharyngeal Nasopharyngeal Swab     Status: None   Collection Time: 03/08/20 11:30 PM    Specimen: Nasopharyngeal Swab  Result Value Ref Range Status   SARS Coronavirus 2 NEGATIVE NEGATIVE Final    Comment: (NOTE) SARS-CoV-2 target nucleic acids are NOT DETECTED. The SARS-CoV-2 RNA is generally detectable in upper and lower respiratory specimens during the acute phase of infection. Negative results do not preclude SARS-CoV-2 infection, do not rule out co-infections with other pathogens, and should not be used as the sole basis for treatment or other patient management decisions. Negative results must be combined with clinical observations, patient history, and epidemiological information. The expected result is Negative. Fact Sheet for Patients: HairSlick.no Fact Sheet for Healthcare Providers: quierodirigir.com This test is not yet approved or cleared by the [place] FDA and  has been authorized for detection and/or diagnosis of SARS-CoV-2 by FDA under an Emergency Use Authorization (EUA). This EUA will  remain  in effect (meaning this test can be used) for the duration of the COVID-19 declaration under Section 56 4(b)(1) of the Act, 21 U.S.C. section 360bbb-3(b)(1), unless the authorization is terminated or revoked sooner. Performed at [facility], [address], [place], [place] [address]   Culture, blood (Routine X 2) w Reflex to ID Panel     Status: None (Preliminary result)   Collection Time: 03/09/20  6:25 PM   Specimen: Right Antecubital; Blood  Result Value Ref Range Status   Specimen Description RIGHT ANTECUBITAL  Final   Special Requests   Final    BOTTLES DRAWN AEROBIC AND ANAEROBIC Blood Culture adequate volume   Culture   Final    NO GROWTH 2 DAYS Performed at [facility], [address], [place], [place] [address]    Report Status PENDING  Incomplete  Culture, blood (Routine X 2) w Reflex to ID Panel     Status: None (Preliminary result)   Collection Time: 03/09/20  6:25 PM    Specimen: BLOOD RIGHT HAND  Result Value Ref Range Status   Specimen Description BLOOD RIGHT HAND  Final   Special Requests   Final    BOTTLES DRAWN AEROBIC ONLY Blood Culture adequate volume   Culture   Final    NO GROWTH 2 DAYS Performed at [facility], [address], [place], [place] [address]    Report Status PENDING  Incomplete    Radiology Reports CT ANGIO CHEST PE W OR WO CONTRAST  Result Date: 03/08/2020 CLINICAL DATA:  Hypoxia and peripheral edema EXAM: CT ANGIOGRAPHY CHEST WITH CONTRAST TECHNIQUE: Multidetector CT imaging of the chest was performed using the standard protocol during bolus administration of intravenous contrast. Multiplanar CT image reconstructions and MIPs were obtained to evaluate the vascular anatomy. CONTRAST:  OMNIPAQUE IOHEXOL 350 MG/ML SOLN COMPARISON:  Chest x-ray from earlier in the same day, CT from 04/18/2004. FINDINGS: Cardiovascular: Thoracic aorta and its branches demonstrate atherosclerotic calcifications. No aneurysmal dilatation or dissection is seen. Coronary calcifications are noted. No significant cardiac enlargement is seen. The pulmonary artery shows a normal branching pattern without intraluminal filling defect to suggest pulmonary embolism. Mediastinum/Nodes: Thoracic inlet is within normal limits. No hilar or mediastinal adenopathy is noted. The esophagus as visualized is within normal limits. Lungs/Pleura: Lungs are well aerated bilaterally. A 5 mm nodule is noted in the medial aspect of the left upper lobe best seen on image number 21 of series 6. This has increased in size from the prior exam sixteen years previous at which time it measured 1-2 mm elevation of the right hemidiaphragm is seen. This is stable in appearance from the prior exam. No other nodules are seen. Small right pleural effusion is noted with right basilar atelectasis. Upper Abdomen: Visualized abdomen shows no acute abnormality. Musculoskeletal: Changes of anasarca are  noted peripherally consistent with that described in the lower extremities. Degenerative changes of the thoracic spine are seen. No acute compression deformity is noted. Review of the MIP images confirms the above findings. IMPRESSION: No evidence of pulmonary emboli. Small right pleural effusion with right basilar atelectasis. 5 mm nodule in the left upper lobe increased in size from 16 years ago at which time it measured 1-2 mm. No follow-up needed if patient is low-risk. Non-contrast chest CT can be considered in 12 months if patient is high-risk. This recommendation follows the consensus statement: Guidelines for Management of Incidental Pulmonary Nodules Detected on CT Images: From the Fleischner Society 2017;  Radiology 2017; [id number]. Changes of anasarca. Aortic Atherosclerosis (ICD10-I70.0). Electronically Signed   By: Inez Catalina M.D.   On: 03/08/2020 20:33   US Venous Img Upper Uni Left (DVT)  Result Date: 03/09/2020 CLINICAL DATA:  Left upper extremity edema.  Evaluate for DVT. EXAM: LEFT UPPER EXTREMITY VENOUS DOPPLER ULTRASOUND TECHNIQUE: Gray-scale sonography with graded compression, as well as color Doppler and duplex ultrasound were performed to evaluate the upper extremity deep venous system from the level of the subclavian vein and including the jugular, axillary, basilic, radial, ulnar and upper cephalic vein. Spectral Doppler was utilized to evaluate flow at rest and with distal augmentation maneuvers. COMPARISON:  None. FINDINGS: Contralateral Subclavian Vein: Respiratory phasicity is normal and symmetric with the symptomatic side. No evidence of thrombus. Normal compressibility. Internal Jugular Vein: No evidence of thrombus. Normal compressibility, respiratory phasicity and response to augmentation. Subclavian Vein: No evidence of thrombus. Normal compressibility, respiratory phasicity and response to augmentation. Axillary Vein: No evidence of thrombus. Normal compressibility,  respiratory phasicity and response to augmentation. Cephalic Vein: No evidence of thrombus. Normal compressibility, respiratory phasicity and response to augmentation. Basilic Vein: No evidence of thrombus. Normal compressibility, respiratory phasicity and response to augmentation. Brachial Veins: No evidence of thrombus. Normal compressibility, respiratory phasicity and response to augmentation. Radial Veins: No evidence of thrombus. Normal compressibility, respiratory phasicity and response to augmentation. Ulnar Veins: No evidence of thrombus. Normal compressibility, respiratory phasicity and response to augmentation. Venous Reflux:  None visualized. Other Findings: There is a moderate amount of subcutaneous edema at the level of the left arm and forearm. IMPRESSION: No evidence of DVT within the left upper extremity. Electronically Signed   By: Sandi Mariscal M.D.   On: 03/09/2020 16:16   DG Chest Portable 1 View  Result Date: 03/08/2020 CLINICAL DATA:  Anasarca EXAM: PORTABLE CHEST 1 VIEW COMPARISON:  2018 FINDINGS: Stable elevation of the right hemidiaphragm. No new consolidation or edema. No significant pleural effusion. Stable cardiomediastinal contours. IMPRESSION: Stable significant elevation of the right hemidiaphragm. No acute finding. Electronically Signed   By: Macy Mis M.D.   On: 03/08/2020 16:30   ECHOCARDIOGRAM COMPLETE  Result Date: 03/09/2020    ECHOCARDIOGRAM REPORT   Patient Name:   BURLIE CAJAMARCA Date of Exam: 03/09/2020 Medical Rec #:  [id number]       Height:       66.0 in Accession #:    [id number]      Weight:       237.0 lb Date of Birth:  1933-11-16      BSA:          2.149 m Patient Age:    61 years        BP:           106/83 mmHg Patient Gender: M               HR:           97 bpm. Exam Location:  [facility] Procedure: 2D Echo Indications:    Swelling of both lower extremities  History:        Patient has no prior history of Echocardiogram examinations.                  Arrythmias:Atrial Fibrillation; Risk Factors:Former Smoker and                 Hypertension. Cellulitis.  Sonographer:    Leavy Cella RDCS (AE) Referring Phys: [place]  1.  Left ventricular ejection fraction, by estimation, is 60 to 65%. The left ventricle has normal function. The left ventricle has no regional wall motion abnormalities. There is mild concentric left ventricular hypertrophy. Left ventricular diastolic parameters were normal. There is the interventricular septum is flattened in systole, consistent with right ventricular pressure overload.  2. Right ventricular systolic function is mildly reduced. The right ventricular size is mildly enlarged. There is moderately elevated pulmonary artery systolic pressure.  3. Right atrial size was mildly dilated.  4. The mitral valve is degenerative. Mild mitral valve regurgitation.  5. Tricuspid valve regurgitation is moderate.  6. The aortic valve is tricuspid. Aortic valve regurgitation is not visualized. Mild aortic valve sclerosis is present, with no evidence of aortic valve stenosis.  7. The inferior vena cava is dilated in size with <50% respiratory variability, suggesting right atrial pressure of 15 mmHg. FINDINGS  Left Ventricle: Left ventricular ejection fraction, by estimation, is 60 to 65%. The left ventricle has normal function. The left ventricle has no regional wall motion abnormalities. The left ventricular internal cavity size was normal in size. There is  mild concentric left ventricular hypertrophy. The interventricular septum is flattened in systole, consistent with right ventricular pressure overload. Left ventricular diastolic parameters were normal. Right Ventricle: The right ventricular size is mildly enlarged. No increase in right ventricular wall thickness. Right ventricular systolic function is mildly reduced. There is moderately elevated pulmonary artery systolic pressure. The tricuspid regurgitant  velocity is 2.96 m/s, and with an assumed right atrial pressure of 15 mmHg, the estimated right ventricular systolic pressure is 50.0 mmHg. Left Atrium: Left atrial size was normal in size. Right Atrium: Right atrial size was mildly dilated. Pericardium: There is no evidence of pericardial effusion. Mitral Valve: The mitral valve is degenerative in appearance. There is mild thickening of the mitral valve leaflet(s). Moderate mitral annular calcification. Mild mitral valve regurgitation. Tricuspid Valve: The tricuspid valve is grossly normal. Tricuspid valve regurgitation is moderate. Aortic Valve: The aortic valve is tricuspid. . There is mild thickening and mild calcification of the aortic valve. Aortic valve regurgitation is not visualized. Mild aortic valve sclerosis is present, with no evidence of aortic valve stenosis. Mild to moderate aortic valve annular calcification. There is mild thickening of the aortic valve. There is mild calcification of the aortic valve. Pulmonic Valve: The pulmonic valve was grossly normal. Pulmonic valve regurgitation is mild. Aorta: The aortic root is normal in size and structure. Venous: The inferior vena cava is dilated in size with less than 50% respiratory variability, suggesting right atrial pressure of 15 mmHg. IAS/Shunts: The interatrial septum was not well visualized.  LEFT VENTRICLE PLAX 2D LVIDd:         4.19 cm  Diastology LVIDs:         2.49 cm  LV e' lateral:   13.60 cm/s LV PW:         1.29 cm  LV E/e' lateral: 8.0 LV IVS:        1.12 cm  LV e' medial:    10.10 cm/s LVOT diam:     2.00 cm  LV E/e' medial:  10.8 LVOT Area:     3.14 cm  RIGHT VENTRICLE TAPSE (M-mode): 1.3 cm LEFT ATRIUM             Index       RIGHT ATRIUM           Index LA diam:        5.00 cm  2.33 cm/m  RA Area:     22.40 cm LA Vol (A2C):   48.2 ml 22.43 ml/m RA Volume:   63.50 ml  29.54 ml/m LA Vol (A4C):   44.5 ml 20.70 ml/m LA Biplane Vol: 46.6 ml 21.68 ml/m   AORTA Ao Root diam: 2.90 cm  MITRAL VALVE                TRICUSPID VALVE MV Area (PHT): 3.58 cm     TR Peak grad:   35.0 mmHg MV Decel Time: 212 msec     TR Vmax:        296.00 cm/s MR Peak grad: 46.2 mmHg MR Mean grad: 25.0 mmHg     SHUNTS MR Vmax:      340.00 cm/s   Systemic Diam: 2.00 cm MR Vmean:     236.0 cm/s MV E velocity: 108.67 cm/s Prentice Docker MD Electronically signed by Prentice Docker MD Signature Date/Time: 03/09/2020/12:59:08 PM    Final      CBC Recent Labs  Lab 03/08/20 1535 03/09/20 0554 03/10/20 1028 03/11/20 0427  WBC 18.9* 21.1* 17.0* 16.4*  HGB 16.7 15.7 16.4 16.0  HCT 51.1 48.5 52.2* 50.8  PLT 143* 139* 108* 100*  MCV 98.1 98.0 100.4* 100.6*  MCH 32.1 31.7 31.5 31.7  MCHC 32.7 32.4 31.4 31.5  RDW 19.8* 19.6* 20.0* 19.6*  LYMPHSABS 0.8  --   --   --   MONOABS 1.3*  --   --   --   EOSABS 0.0  --   --   --   BASOSABS 0.0  --   --   --     Chemistries  Recent Labs  Lab 03/08/20 1535 03/08/20 1758 03/09/20 0554 03/10/20 1028 03/11/20 0427  NA 140  --  140 140 142  K 3.7  --  4.2 3.7 3.6  CL 99  --  101 97* 96*  CO2 31  --  31 35* 36*  GLUCOSE 100*  --  76 112* 86  BUN 29*  --  29* 29* 26*  CREATININE 1.28*  --  1.24 1.25* 1.14  CALCIUM 9.4  --  9.1 8.9 8.5*  MG  --  2.5*  --   --  2.3  AST 37  --  30  --   --   ALT 30  --  27  --   --   ALKPHOS 78  --  72  --   --   BILITOT 2.9* 2.7* 2.2*  --   --    ------------------------------------------------------------------------------------------------------------------ No results for input(s): CHOL, HDL, LDLCALC, TRIG, CHOLHDL, LDLDIRECT in the last 72 hours.  No results found for: HGBA1C ------------------------------------------------------------------------------------------------------------------ No results for input(s): TSH, T4TOTAL, T3FREE, THYROIDAB in the last 72 hours.  Invalid input(s): FREET3 ------------------------------------------------------------------------------------------------------------------ No  results for input(s): VITAMINB12, FOLATE, FERRITIN, TIBC, IRON, RETICCTPCT in the last 72 hours.  Coagulation profile No results for input(s): INR, PROTIME in the last 168 hours.  No results for input(s): DDIMER in the last 72 hours.  Cardiac Enzymes No results for input(s): CKMB, TROPONINI, MYOGLOBIN in the last 168 hours.  Invalid input(s): CK ------------------------------------------------------------------------------------------------------------------    Component Value Date/Time   BNP 280.0 (H) 03/08/2020 1535     Elsy Chiang M.D on 03/11/2020 at 6:00 PM  Go to [email or web address] - for contact info  [facility] - Office  [phone]

## 2020-03-11 NOTE — Plan of Care (Signed)
  Problem: Acute Rehab PT Goals(only PT should resolve) Goal: Pt Will Go Supine/Side To Sit Outcome: Progressing Flowsheets (Taken 03/11/2020 0847) Pt will go Supine/Side to Sit: with minimal assist Goal: Pt Will Go Sit To Supine/Side Outcome: Progressing Flowsheets (Taken 03/11/2020 0847) Pt will go Sit to Supine/Side: with minimal assist Goal: Patient Will Perform Sitting Balance Outcome: Progressing Flowsheets (Taken 03/11/2020 0847) Patient will perform sitting balance: with supervision Goal: Patient Will Transfer Sit To/From Stand Outcome: Progressing Flowsheets (Taken 03/11/2020 0847) Patient will transfer sit to/from stand: with minimal assist Goal: Pt Will Transfer Bed To Chair/Chair To Bed Outcome: Progressing Flowsheets (Taken 03/11/2020 0847) Pt will Transfer Bed to Chair/Chair to Bed: with min assist Goal: Pt Will Ambulate Outcome: Progressing Flowsheets (Taken 03/11/2020 0847) Pt will Ambulate:  25 feet  with minimal assist   8:48 AM, 03/11/20 Wyman Songster PT, DPT Physical Therapist at [facility]

## 2020-03-12 LAB — SARS CORONAVIRUS 2 (TAT 6-24 HRS): SARS Coronavirus 2: NEGATIVE

## 2020-03-12 MED ORDER — ENSURE ENLIVE PO LIQD
237.0000 mL | Freq: Two times a day (BID) | ORAL | Status: DC
  Administered 2020-03-13 – 2020-03-14 (×2): 237 mL via ORAL

## 2020-03-12 MED ORDER — BISACODYL 10 MG RE SUPP
10.0000 mg | Freq: Once | RECTAL | Status: AC
  Administered 2020-03-12: 10 mg via RECTAL
  Filled 2020-03-12: qty 1

## 2020-03-12 MED ORDER — LEVALBUTEROL HCL 0.63 MG/3ML IN NEBU
0.6300 mg | INHALATION_SOLUTION | Freq: Two times a day (BID) | RESPIRATORY_TRACT | Status: DC
  Administered 2020-03-12 – 2020-03-14 (×4): 0.63 mg via RESPIRATORY_TRACT
  Filled 2020-03-12 (×5): qty 3

## 2020-03-12 NOTE — Plan of Care (Signed)
  Problem: Nutrition: Goal: Adequate nutrition will be maintained Outcome: Progressing Note: 1:1 supervision with meals

## 2020-03-12 NOTE — Progress Notes (Signed)
Patient Demographics:    Jesus Fowler, is a 86 y.o. male, DOB - October 26, 1933, ZOX:[id number]  Admit date - 03/08/2020   Admitting Physician Ejiroghene Wendall Stade, MD  Outpatient Primary MD for the patient is [name], MD \]LOS - 4  Chief Complaint  Patient presents with  . Leg Swelling        Subjective:    Jesus Fowler today has no fevers, no emesis,  No chest pain---  left arm pain and swelling improving,   -Continues to require oxygen, PTA was not on home O2 -Did well overnight on Bipap -   Assessment  & Plan :    Principal Problem:   Swelling of lower extremity Active Problems:   Atrial fibrillation, chronic (HCC)   Swelling of both lower extremities  Brief Summary -86 y.o. male with medical history significant for atrial fibrillation, hypertension.  Patient presented to the ED with complaints of generalized abdominal swelling involving his bilateral legs, abdomen, bilateral upper extremity over  several weeks PTA  admitted on 03/08/2020 with dyspnea on acute hypoxic respiratory failure and concerns about left upper extremity cellulitis and possible hematoma/ecchymosis  A/p  1)Acute Hypoxic Respiratory Failure--- suspect acute on chronic HFpEF/CHF related, BNP is 280,  --Patient is a reformed smoker so cannot exclude some component of COPD -CTA chest w/o acute PE, small right pleural effusion and right-sided atelectasis noted --... Currently requiring 2 L of oxygen via nasal cannula -PTA patient was not on home O2 -Echo with EF of 60 to 65% -Continue gentle diuresis, daily weights, fluid input and output monitoring.   --weight is down to 227 pounds from 236.99 -Fluid balance is negative -Covid Negative -Blood pressure is somewhat soft, continue IV Lasix  20 mg twice daily down from 40 mg daily -Continue CPAP with sleep  2)Left upper extremity cellulitis----WBC is down to 17.0  from 21.1, -- PTA patient was treated with Levaquin as outpatient, no fevers, continue IV Rocephin, Blood Cx NGTD -Left upper extremity venous Dopplers without acute DVT  3)Generalized Weakness and Deconditioning -TSH 0.4, PT eval appreciated, recommends SNF rehab  4) chronic Atrial fibrillation- rates 80s to 90s.  Rate controlled and anticoagulated. -Continue Eliquis for stroke prophylaxis and decrease Toprol-XL to 25 mg daily from 50 mg daily due to soft BP   for rate control  5)Prolonged QTC- 457  ---  Keep potassium close to 4 magnesium above 2  6)Elevated bilirubin-   T bil 2.7 to 2.9 (indirect 1.9, direct 0.8),  --LFTs WNL -Asymptomatic at this time outpatient follow-up advised.  7)Hypertension--- BP soft despite holding lisinopril/HCTZ and amlodipine, decreased Toprol-XL to 25 mg daily from 50 mg daily  8)BPH--- continue Flomax  9)Dysphagia-- Speech eval appreciated, recommends---Dysphagia 2 (Fine chop);Thin liquid, Pt benefits from liquid wash during PO consumption.   Disposition/Need for in-Hospital Stay- patient unable to be discharged at this time due to --- left upper extremity cellulitis with  leukocytosis and the patient to failed outpatient oral Levaquin requiring IV Rocephin, hypoxic respiratory failure requiring IV lasix diuresis and oxygen  --Awaiting insurance authorization for transfer to SNF rehab -Pending insurance authorization for SNF rehab may be switched to p.o. Lasix and p.o. antibiotics at that time  Code Status : Full  Family Communication:   (  patient is alert, awake and coherent) Discussed with son who is visiting from [place]  Consults  :  na DVT Prophylaxis  : eliquis/- SCDs   Lab Results  Component Value Date   PLT 100 (L) 03/11/2020    Inpatient Medications  Scheduled Meds: . apixaban  2.5 mg Oral BID  . erythromycin   Both Eyes Q8H  . [START ON 03/13/2020] feeding supplement (ENSURE ENLIVE)  237 mL Oral BID BM  . furosemide  20 mg  Intravenous BID  . HYDROcodone-acetaminophen  1 tablet Oral QID  . levalbuterol  0.63 mg Nebulization BID  . metoprolol succinate  25 mg Oral Daily  . potassium chloride  10 mEq Oral Daily  . rosuvastatin  10 mg Oral q AM  . tamsulosin  0.4 mg Oral Daily   Continuous Infusions: . cefTRIAXone (ROCEPHIN)  IV 1 g (03/11/20 2205)   PRN Meds:.acetaminophen **OR** acetaminophen, albuterol, hydrocortisone cream, hydrOXYzine, ondansetron **OR** ondansetron (ZOFRAN) IV, polyethylene glycol, sodium chloride flush    Anti-infectives (From admission, onward)   Start     Dose/Rate Route Frequency Ordered Stop   03/08/20 2100  cefTRIAXone (ROCEPHIN) 1 g in sodium chloride 0.9 % 100 mL IVPB     1 g 200 mL/hr over 30 Minutes Intravenous Every 24 hours 03/08/20 2006          Objective:   Vitals:   03/12/20 0647 03/12/20 0850 03/12/20 0900 03/12/20 1330  BP: 106/64  107/63 105/73  Pulse: 100  77 (!) 102  Resp: 20     Temp: 98.5 F (36.9 C)   98.1 F (36.7 C)  TempSrc: Oral   Oral  SpO2: 100% 94% 96% 98%  Weight: 103 kg     Height:        Wt Readings from Last 3 Encounters:  03/12/20 103 kg     Intake/Output Summary (Last 24 hours) at 03/12/2020 1729 Last data filed at 03/12/2020 0955 Gross per 24 hour  Intake 360 ml  Output 1355 ml  Net -995 ml   Physical Exam  Gen:- Awake Alert,  In no apparent distress  HEENT:- [place].AT, No sclera icterus Eye--much improved conjunctivae findings  Nose -2L/min Neck-Supple Neck,No JVD,.  Lungs-improving air movement, no wheezing  CV- S1, S2 normal, irregularly irregular  abd-  +ve B.Sounds, Abd Soft, No tenderness,    Extremity/Skin:-  pedal pulses present, left upper extremity swelling, warmth, tenderness and erythema is improving, no open draining wounds, chronic lower extremity edema, chronic severe venous stasis dermatitis type changes of both lower extremities Psych-affect is appropriate, oriented x3 Neuro-generalized weakness and  deconditioning, no new focal deficits, no tremors   Data Review:   Micro Results Recent Results (from the past 240 hour(s))  SARS CORONAVIRUS 2 (TAT 6-24 HRS) Nasopharyngeal Nasopharyngeal Swab     Status: None   Collection Time: 03/08/20 11:30 PM   Specimen: Nasopharyngeal Swab  Result Value Ref Range Status   SARS Coronavirus 2 NEGATIVE NEGATIVE Final    Comment: (NOTE) SARS-CoV-2 target nucleic acids are NOT DETECTED. The SARS-CoV-2 RNA is generally detectable in upper and lower respiratory specimens during the acute phase of infection. Negative results do not preclude SARS-CoV-2 infection, do not rule out co-infections with other pathogens, and should not be used as the sole basis for treatment or other patient management decisions. Negative results must be combined with clinical observations, patient history, and epidemiological information. The expected result is Negative. Fact Sheet for Patients: HairSlick.no Fact Sheet for Healthcare Providers:  quierodirigir.com This test is not yet approved or cleared by the [place] and  has been authorized for detection and/or diagnosis of SARS-CoV-2 by FDA under an Emergency Use Authorization (EUA). This EUA will remain  in effect (meaning this test can be used) for the duration of the COVID-19 declaration under Section 56 4(b)(1) of the Act, 21 U.S.C. section 360bbb-3(b)(1), unless the authorization is terminated or revoked sooner. Performed at [facility], [address], [place], [place] [address]   Culture, blood (Routine X 2) w Reflex to ID Panel     Status: None (Preliminary result)   Collection Time: 03/09/20  6:25 PM   Specimen: Right Antecubital; Blood  Result Value Ref Range Status   Specimen Description RIGHT ANTECUBITAL  Final   Special Requests   Final    BOTTLES DRAWN AEROBIC AND ANAEROBIC Blood Culture adequate volume   Culture   Final    NO  GROWTH 3 DAYS Performed at [facility], [address] [place], [place] [address]    Report Status PENDING  Incomplete  Culture, blood (Routine X 2) w Reflex to ID Panel     Status: None (Preliminary result)   Collection Time: 03/09/20  6:25 PM   Specimen: BLOOD RIGHT HAND  Result Value Ref Range Status   Specimen Description BLOOD RIGHT HAND  Final   Special Requests   Final    BOTTLES DRAWN AEROBIC ONLY Blood Culture adequate volume   Culture   Final    NO GROWTH 3 DAYS Performed at [facility], [address], [place], [place] [address]    Report Status PENDING  Incomplete  SARS CORONAVIRUS 2 (TAT 6-24 HRS) Nasopharyngeal Nasopharyngeal Swab     Status: None   Collection Time: 03/11/20  5:57 PM   Specimen: Nasopharyngeal Swab  Result Value Ref Range Status   SARS Coronavirus 2 NEGATIVE NEGATIVE Final    Comment: (NOTE) SARS-CoV-2 target nucleic acids are NOT DETECTED. The SARS-CoV-2 RNA is generally detectable in upper and lower respiratory specimens during the acute phase of infection. Negative results do not preclude SARS-CoV-2 infection, do not rule out co-infections with other pathogens, and should not be used as the sole basis for treatment or other patient management decisions. Negative results must be combined with clinical observations, patient history, and epidemiological information. The expected result is Negative. Fact Sheet for Patients: HairSlick.no Fact Sheet for Healthcare Providers: quierodirigir.com This test is not yet approved or cleared by the [place] FDA and  has been authorized for detection and/or diagnosis of SARS-CoV-2 by FDA under an Emergency Use Authorization (EUA). This EUA will remain  in effect (meaning this test can be used) for the duration of the COVID-19 declaration under Section 56 4(b)(1) of the Act, 21 U.S.C. section 360bbb-3(b)(1), unless the authorization is terminated  or revoked sooner. Performed at [facility], [address] [place], [place] [address]     Radiology Reports CT ANGIO CHEST PE W OR WO CONTRAST  Result Date: 03/08/2020 CLINICAL DATA:  Hypoxia and peripheral edema EXAM: CT ANGIOGRAPHY CHEST WITH CONTRAST TECHNIQUE: Multidetector CT imaging of the chest was performed using the standard protocol during bolus administration of intravenous contrast. Multiplanar CT image reconstructions and MIPs were obtained to evaluate the vascular anatomy. CONTRAST:  OMNIPAQUE IOHEXOL 350 MG/ML SOLN COMPARISON:  Chest x-ray from earlier in the same day, CT from 04/18/2004. FINDINGS: Cardiovascular: Thoracic aorta and its branches demonstrate atherosclerotic calcifications. No aneurysmal dilatation or dissection  is seen. Coronary calcifications are noted. No significant cardiac enlargement is seen. The pulmonary artery shows a normal branching pattern without intraluminal filling defect to suggest pulmonary embolism. Mediastinum/Nodes: Thoracic inlet is within normal limits. No hilar or mediastinal adenopathy is noted. The esophagus as visualized is within normal limits. Lungs/Pleura: Lungs are well aerated bilaterally. A 5 mm nodule is noted in the medial aspect of the left upper lobe best seen on image number 21 of series 6. This has increased in size from the prior exam sixteen years previous at which time it measured 1-2 mm elevation of the right hemidiaphragm is seen. This is stable in appearance from the prior exam. No other nodules are seen. Small right pleural effusion is noted with right basilar atelectasis. Upper Abdomen: Visualized abdomen shows no acute abnormality. Musculoskeletal: Changes of anasarca are noted peripherally consistent with that described in the lower extremities. Degenerative changes of the thoracic spine are seen. No acute compression deformity is noted. Review of the MIP images confirms the above findings. IMPRESSION: No evidence  of pulmonary emboli. Small right pleural effusion with right basilar atelectasis. 5 mm nodule in the left upper lobe increased in size from 16 years ago at which time it measured 1-2 mm. No follow-up needed if patient is low-risk. Non-contrast chest CT can be considered in 12 months if patient is high-risk. This recommendation follows the consensus statement: Guidelines for Management of Incidental Pulmonary Nodules Detected on CT Images: From the Fleischner Society 2017; Radiology 2017; 284:228-243. Changes of anasarca. Aortic Atherosclerosis (ICD10-I70.0). Electronically Signed   By: Inez Catalina M.D.   On: 03/08/2020 20:33   US Venous Img Upper Uni Left (DVT)  Result Date: 03/09/2020 CLINICAL DATA:  Left upper extremity edema.  Evaluate for DVT. EXAM: LEFT UPPER EXTREMITY VENOUS DOPPLER ULTRASOUND TECHNIQUE: Gray-scale sonography with graded compression, as well as color Doppler and duplex ultrasound were performed to evaluate the upper extremity deep venous system from the level of the subclavian vein and including the jugular, axillary, basilic, radial, ulnar and upper cephalic vein. Spectral Doppler was utilized to evaluate flow at rest and with distal augmentation maneuvers. COMPARISON:  None. FINDINGS: Contralateral Subclavian Vein: Respiratory phasicity is normal and symmetric with the symptomatic side. No evidence of thrombus. Normal compressibility. Internal Jugular Vein: No evidence of thrombus. Normal compressibility, respiratory phasicity and response to augmentation. Subclavian Vein: No evidence of thrombus. Normal compressibility, respiratory phasicity and response to augmentation. Axillary Vein: No evidence of thrombus. Normal compressibility, respiratory phasicity and response to augmentation. Cephalic Vein: No evidence of thrombus. Normal compressibility, respiratory phasicity and response to augmentation. Basilic Vein: No evidence of thrombus. Normal compressibility, respiratory phasicity and  response to augmentation. Brachial Veins: No evidence of thrombus. Normal compressibility, respiratory phasicity and response to augmentation. Radial Veins: No evidence of thrombus. Normal compressibility, respiratory phasicity and response to augmentation. Ulnar Veins: No evidence of thrombus. Normal compressibility, respiratory phasicity and response to augmentation. Venous Reflux:  None visualized. Other Findings: There is a moderate amount of subcutaneous edema at the level of the left arm and forearm. IMPRESSION: No evidence of DVT within the left upper extremity. Electronically Signed   By: Sandi Mariscal M.D.   On: 03/09/2020 16:16   DG Chest Portable 1 View  Result Date: 03/08/2020 CLINICAL DATA:  Anasarca EXAM: PORTABLE CHEST 1 VIEW COMPARISON:  2018 FINDINGS: Stable elevation of the right hemidiaphragm. No new consolidation or edema. No significant pleural effusion. Stable cardiomediastinal contours. IMPRESSION: Stable significant elevation of the right  hemidiaphragm. No acute finding. Electronically Signed   By: Guadlupe Spanish M.D.   On: 03/08/2020 16:30   ECHOCARDIOGRAM COMPLETE  Result Date: 03/09/2020    ECHOCARDIOGRAM REPORT   Patient Name:   Jesus Fowler Date of Exam: 03/09/2020 Medical Rec #:  [id number]       Height:       66.0 in Accession #:    [id number]      Weight:       237.0 lb Date of Birth:  [birth year]      BSA:          2.149 m Patient Age:    86 years        BP:           106/83 mmHg Patient Gender: M               HR:           97 bpm. [facility] Procedure: 2D Echo Indications:    Swelling of both lower extremities  History:        Patient has no prior history of Echocardiogram examinations.                 Arrythmias:Atrial Fibrillation; Risk Factors:Former Smoker and                 Hypertension. Cellulitis.  Sonographer:    Jeryl Columbia RDCS (AE) Referring Phys: [id number] [facility] IMPRESSIONS  1. Left ventricular ejection fraction, by estimation, is 60  to 65%. The left ventricle has normal function. The left ventricle has no regional wall motion abnormalities. There is mild concentric left ventricular hypertrophy. Left ventricular diastolic parameters were normal. There is the interventricular septum is flattened in systole, consistent with right ventricular pressure overload.  2. Right ventricular systolic function is mildly reduced. The right ventricular size is mildly enlarged. There is moderately elevated pulmonary artery systolic pressure.  3. Right atrial size was mildly dilated.  4. The mitral valve is degenerative. Mild mitral valve regurgitation.  5. Tricuspid valve regurgitation is moderate.  6. The aortic valve is tricuspid. Aortic valve regurgitation is not visualized. Mild aortic valve sclerosis is present, with no evidence of aortic valve stenosis.  7. The inferior vena cava is dilated in size with <50% respiratory variability, suggesting right atrial pressure of 15 mmHg. FINDINGS  Left Ventricle: Left ventricular ejection fraction, by estimation, is 60 to 65%. The left ventricle has normal function. The left ventricle has no regional wall motion abnormalities. The left ventricular internal cavity size was normal in size. There is  mild concentric left ventricular hypertrophy. The interventricular septum is flattened in systole, consistent with right ventricular pressure overload. Left ventricular diastolic parameters were normal. Right Ventricle: The right ventricular size is mildly enlarged. No increase in right ventricular wall thickness. Right ventricular systolic function is mildly reduced. There is moderately elevated pulmonary artery systolic pressure. The tricuspid regurgitant velocity is 2.96 m/s, and with an assumed right atrial pressure of 15 mmHg, the estimated right ventricular systolic pressure is 50.0 mmHg. Left Atrium: Left atrial size was normal in size. Right Atrium: Right atrial size was mildly dilated. Pericardium: There is no  evidence of pericardial effusion. Mitral Valve: The mitral valve is degenerative in appearance. There is mild thickening of the mitral valve leaflet(s). Moderate mitral annular calcification. Mild mitral valve regurgitation. Tricuspid Valve: The tricuspid valve is grossly normal. Tricuspid valve regurgitation is moderate. Aortic Valve: The aortic valve is tricuspid. Marland Kitchen  There is mild thickening and mild calcification of the aortic valve. Aortic valve regurgitation is not visualized. Mild aortic valve sclerosis is present, with no evidence of aortic valve stenosis. Mild to moderate aortic valve annular calcification. There is mild thickening of the aortic valve. There is mild calcification of the aortic valve. Pulmonic Valve: The pulmonic valve was grossly normal. Pulmonic valve regurgitation is mild. Aorta: The aortic root is normal in size and structure. Venous: The inferior vena cava is dilated in size with less than 50% respiratory variability, suggesting right atrial pressure of 15 mmHg. IAS/Shunts: The interatrial septum was not well visualized.  LEFT VENTRICLE PLAX 2D LVIDd:         4.19 cm  Diastology LVIDs:         2.49 cm  LV e' lateral:   13.60 cm/s LV PW:         1.29 cm  LV E/e' lateral: 8.0 LV IVS:        1.12 cm  LV e' medial:    10.10 cm/s LVOT diam:     2.00 cm  LV E/e' medial:  10.8 LVOT Area:     3.14 cm  RIGHT VENTRICLE TAPSE (M-mode): 1.3 cm LEFT ATRIUM             Index       RIGHT ATRIUM           Index LA diam:        5.00 cm 2.33 cm/m  RA Area:     22.40 cm LA Vol (A2C):   48.2 ml 22.43 ml/m RA Volume:   63.50 ml  29.54 ml/m LA Vol (A4C):   44.5 ml 20.70 ml/m LA Biplane Vol: 46.6 ml 21.68 ml/m   AORTA Ao Root diam: 2.90 cm MITRAL VALVE                TRICUSPID VALVE MV Area (PHT): 3.58 cm     TR Peak grad:   35.0 mmHg MV Decel Time: 212 msec     TR Vmax:        296.00 cm/s MR Peak grad: 46.2 mmHg MR Mean grad: 25.0 mmHg     SHUNTS MR Vmax:      340.00 cm/s   Systemic Diam: 2.00 cm MR  Vmean:     236.0 cm/s MV E velocity: 108.67 cm/s Prentice DockerSuresh Koneswaran MD Electronically signed by Prentice DockerSuresh Koneswaran MD Signature Date/Time: 03/09/2020/12:59:08 PM    Final      CBC Recent Labs  Lab 03/08/20 1535 03/09/20 0554 03/10/20 1028 03/11/20 0427  WBC 18.9* 21.1* 17.0* 16.4*  HGB 16.7 15.7 16.4 16.0  HCT 51.1 48.5 52.2* 50.8  PLT 143* 139* 108* 100*  MCV 98.1 98.0 100.4* 100.6*  MCH 32.1 31.7 31.5 31.7  MCHC 32.7 32.4 31.4 31.5  RDW 19.8* 19.6* 20.0* 19.6*  LYMPHSABS 0.8  --   --   --   MONOABS 1.3*  --   --   --   EOSABS 0.0  --   --   --   BASOSABS 0.0  --   --   --     Chemistries  Recent Labs  Lab 03/08/20 1535 03/08/20 1758 03/09/20 0554 03/10/20 1028 03/11/20 0427  NA 140  --  140 140 142  K 3.7  --  4.2 3.7 3.6  CL 99  --  101 97* 96*  CO2 31  --  31 35* 36*  GLUCOSE 100*  --  76 112*  86  BUN 29*  --  29* 29* 26*  CREATININE 1.28*  --  1.24 1.25* 1.14  CALCIUM 9.4  --  9.1 8.9 8.5*  MG  --  2.5*  --   --  2.3  AST 37  --  30  --   --   ALT 30  --  27  --   --   ALKPHOS 78  --  72  --   --   BILITOT 2.9* 2.7* 2.2*  --   --    ------------------------------------------------------------------------------------------------------------------ No results for input(s): CHOL, HDL, LDLCALC, TRIG, CHOLHDL, LDLDIRECT in the last 72 hours.  No results found for: HGBA1C ------------------------------------------------------------------------------------------------------------------ No results for input(s): TSH, T4TOTAL, T3FREE, THYROIDAB in the last 72 hours.  Invalid input(s): FREET3 ------------------------------------------------------------------------------------------------------------------ No results for input(s): VITAMINB12, FOLATE, FERRITIN, TIBC, IRON, RETICCTPCT in the last 72 hours.  Coagulation profile No results for input(s): INR, PROTIME in the last 168 hours.  No results for input(s): DDIMER in the last 72 hours.  Cardiac Enzymes No  results for input(s): CKMB, TROPONINI, MYOGLOBIN in the last 168 hours.  Invalid input(s): CK ------------------------------------------------------------------------------------------------------------------    Component Value Date/Time   BNP 280.0 (H) 03/08/2020 1535   Shon Hale M.D on 03/12/2020 at 5:29 PM  Go to [email or web address] - for contact info  [facility] - Office  [phone]

## 2020-03-13 ENCOUNTER — Inpatient Hospital Stay (HOSPITAL_COMMUNITY): Payer: Medicare Other

## 2020-03-13 LAB — BASIC METABOLIC PANEL
Anion gap: 7 (ref 5–15)
BUN: 21 mg/dL (ref 8–23)
CO2: 40 mmol/L — ABNORMAL HIGH (ref 22–32)
Calcium: 8.1 mg/dL — ABNORMAL LOW (ref 8.9–10.3)
Chloride: 93 mmol/L — ABNORMAL LOW (ref 98–111)
Creatinine, Ser: 0.87 mg/dL (ref 0.61–1.24)
GFR calc Af Amer: >60 mL/min (ref >60)
GFR calc non Af Amer: >60 mL/min (ref >60)
Glucose, Bld: 94 mg/dL (ref 70–99)
Potassium: 3.7 mmol/L (ref 3.5–5.1)
Sodium: 140 mmol/L (ref 135–145)

## 2020-03-13 LAB — CBC
HCT: 49.4 % (ref 39.0–52.0)
Hemoglobin: 15.1 g/dL (ref 13.0–17.0)
MCH: 31.5 pg (ref 26.0–34.0)
MCHC: 30.6 g/dL (ref 30.0–36.0)
MCV: 103.1 fL — ABNORMAL HIGH (ref 80.0–100.0)
Platelets: 85 10*3/uL — ABNORMAL LOW (ref 150–400)
RBC: 4.79 MIL/uL (ref 4.22–5.81)
RDW: 19.1 % — ABNORMAL HIGH (ref 11.5–15.5)
WBC: 13.8 10*3/uL — ABNORMAL HIGH (ref 4.0–10.5)
nRBC: 0 % (ref 0.0–0.2)

## 2020-03-13 MED ORDER — GUAIFENESIN ER 600 MG PO TB12
600.0000 mg | ORAL_TABLET | Freq: Two times a day (BID) | ORAL | Status: DC
  Administered 2020-03-13 – 2020-03-14 (×2): 600 mg via ORAL
  Filled 2020-03-13 (×3): qty 1

## 2020-03-13 MED ORDER — SALINE SPRAY 0.65 % NA SOLN
2.0000 | Freq: Three times a day (TID) | NASAL | Status: DC
  Administered 2020-03-13 – 2020-03-14 (×3): 2 via NASAL
  Filled 2020-03-13 (×2): qty 44

## 2020-03-13 MED ORDER — BISACODYL 10 MG RE SUPP
10.0000 mg | Freq: Once | RECTAL | Status: AC
  Administered 2020-03-13: 10 mg via RECTAL
  Filled 2020-03-13: qty 1

## 2020-03-13 MED ORDER — LACTULOSE 10 GM/15ML PO SOLN
30.0000 g | Freq: Once | ORAL | Status: AC
  Administered 2020-03-13: 30 g via ORAL
  Filled 2020-03-13: qty 60

## 2020-03-13 MED ORDER — AMOXICILLIN-POT CLAVULANATE 875-125 MG PO TABS
1.0000 | ORAL_TABLET | Freq: Two times a day (BID) | ORAL | Status: DC
  Administered 2020-03-13 – 2020-03-14 (×2): 1 via ORAL
  Filled 2020-03-13 (×2): qty 1

## 2020-03-13 NOTE — TOC Progression Note (Addendum)
Transition of Care [facility]) - Progression Note    Patient Details  Name: Jesus Fowler MRN: [id number] Date of Birth: 1933-09-16  [facility] of [facility] CM/SW Contact  [name], LCSW Phone Number: 03/13/2020, 10:11 AM  Clinical Narrative:     TOC following. Pt has insurance authorization. [employer] Auth number is [id number] and Vesta Mixer number is [id number].  Updated MD, anticipating dc to [facility] on Monday 3/15.   Expected Discharge Plan: Skilled Nursing Facility Barriers to Discharge: Continued Medical Work up  Expected Discharge Plan and Services Expected Discharge Plan: Skilled Nursing Facility In-house Referral: Clinical Social Work   Post Acute Care Choice: Skilled Nursing Facility Living arrangements for the past 2 months: Single Family Home                                       Social Determinants of Health (SDOH) Interventions    Readmission Risk Interventions Readmission Risk Prevention Plan 03/11/2020  Medication Screening Complete  Transportation Screening Complete  Some recent data might be hidden

## 2020-03-13 NOTE — Progress Notes (Signed)
Patient Demographics:    Jesus Fowler, is a 86 y.o. male, DOB - 07-04-1933, ZOX:[id number]  Admit date - 03/08/2020   Admitting Physician Ejiroghene Wendall Stade, MD  Outpatient Primary MD for the patient is [name], MD \]LOS - 5  Chief Complaint  Patient presents with   Leg Swelling        Subjective:    [name] today has no fevers, no emesis,  No chest pain--- -Son at bedside, appetite is not great, generalized weakness persist, -Still no BM -   Assessment  & Plan :    Principal Problem:   Swelling of lower extremity Active Problems:   Atrial fibrillation, chronic (HCC)   Swelling of both lower extremities  Brief Summary -86 y.o. male with medical history significant for atrial fibrillation, hypertension.  Patient presented to the ED with complaints of generalized abdominal swelling involving his bilateral legs, abdomen, bilateral upper extremity over  several weeks PTA  admitted on 03/08/2020 with dyspnea on acute hypoxic respiratory failure and concerns about left upper extremity cellulitis and possible hematoma/ecchymosis  A/p  1)Acute Hypoxic Respiratory Failure--- suspect acute on chronic HFpEF/CHF related, BNP is 280,  --Patient is a reformed smoker so cannot exclude some component of COPD -CTA chest w/o acute PE, small right pleural effusion and right-sided atelectasis noted --... Currently requiring 2 L of oxygen via nasal cannula -PTA patient was not on home O2 -Echo with EF of 60 to 65% -Continue gentle diuresis, daily weights, fluid input and output monitoring.   --weight is down to 224 pounds from 236.99 -Fluid balance is negative -Covid Negative, repeat Covid test pending prior to transfer to SNF rehab -Blood pressure is somewhat soft, continue IV Lasix  20 mg twice daily down from 40 mg daily -Continue CPAP with sleep  2)Left upper extremity  cellulitis----WBC is down to 13.8 from 21.1, -- PTA patient was treated with Levaquin as outpatient, no fevers, continue IV Rocephin, Blood Cx NGTD -Left upper extremity venous Dopplers without acute DVT  3)Generalized Weakness and Deconditioning -TSH 0.4, PT eval appreciated, recommends SNF rehab  4) chronic Atrial fibrillation- rates 80s to 90s.  Rate controlled and anticoagulated. -Continue Eliquis for stroke prophylaxis and decrease Toprol-XL to 25 mg daily from 50 mg daily due to soft BP   for rate control  5)Prolonged QTC- 457  ---  Keep potassium close to 4 magnesium above 2  6)Elevated bilirubin-   T bil 2.7 to 2.9 (indirect 1.9, direct 0.8),  --LFTs WNL -Asymptomatic at this time outpatient follow-up advised.  7)Hypertension--- BP soft despite holding lisinopril/HCTZ and amlodipine, decreased Toprol-XL to 25 mg daily from 50 mg daily  8)BPH--- continue Flomax  9)Dysphagia-- Speech eval appreciated, recommends---Dysphagia 2 (Fine chop);Thin liquid, Pt benefits from liquid wash during PO consumption.   Disposition/Need for in-Hospital Stay- patient unable to be discharged at this time due to --- left upper extremity cellulitis with  leukocytosis and the patient to failed outpatient oral Levaquin requiring IV Rocephin, hypoxic respiratory failure requiring IV lasix diuresis and oxygen   transfer to SNF rehab on Monday 03/14/2020 pending negative COVID-19 test -Discharge to SNF on p.o. Lasix, oxygen, CPAP nightly and p.o. Augmentin   Code Status : Full  Family Communication:   (  patient is alert, awake and coherent) Discussed with son who is visiting from [place]  Consults  :  na DVT Prophylaxis  : eliquis/- SCDs   Lab Results  Component Value Date   PLT 85 (L) 03/13/2020    Inpatient Medications  Scheduled Meds:  amoxicillin-clavulanate  1 tablet Oral BID   apixaban  2.5 mg Oral BID   erythromycin   Both Eyes Q8H   feeding supplement (ENSURE ENLIVE)  237 mL Oral  BID BM   furosemide  20 mg Intravenous BID   guaiFENesin  600 mg Oral BID   HYDROcodone-acetaminophen  1 tablet Oral QID   levalbuterol  0.63 mg Nebulization BID   metoprolol succinate  25 mg Oral Daily   potassium chloride  10 mEq Oral Daily   rosuvastatin  10 mg Oral q AM   sodium chloride  2 spray Each Nare TID   tamsulosin  0.4 mg Oral Daily   Continuous Infusions:  PRN Meds:.acetaminophen **OR** acetaminophen, albuterol, hydrocortisone cream, hydrOXYzine, ondansetron **OR** ondansetron (ZOFRAN) IV, polyethylene glycol, sodium chloride flush    Anti-infectives (From admission, onward)   Start     Dose/Rate Route Frequency Ordered Stop   03/13/20 2000  amoxicillin-clavulanate (AUGMENTIN) 875-125 MG per tablet 1 tablet     1 tablet Oral 2 times daily 03/13/20 1706 03/16/20 1959   03/08/20 2100  cefTRIAXone (ROCEPHIN) 1 g in sodium chloride 0.9 % 100 mL IVPB  Status:  Discontinued     1 g 200 mL/hr over 30 Minutes Intravenous Every 24 hours 03/08/20 2006 03/13/20 1706        Objective:   Vitals:   03/13/20 0500 03/13/20 0510 03/13/20 0814 03/13/20 1425  BP:  96/69  114/71  Pulse:  87  88  Resp:  19  18  Temp:  97.7 F (36.5 C)  98.4 F (36.9 C)  TempSrc:      SpO2:  100% 94% 96%  Weight: 101.8 kg     Height:        Wt Readings from Last 3 Encounters:  03/13/20 101.8 kg     Intake/Output Summary (Last 24 hours) at 03/13/2020 1942 Last data filed at 03/13/2020 1700 Gross per 24 hour  Intake 600 ml  Output 8150 ml  Net -7550 ml   Physical Exam  Gen:- Awake Alert,  In no apparent distress  HEENT:- [place].AT, No sclera icterus Eye--much improved conjunctivae findings  Nose -2L/min Neck-Supple Neck,No JVD,.  Lungs-improving air movement, no wheezing  CV- S1, S2 normal, irregularly irregular  abd-  +ve B.Sounds, Abd Soft, No tenderness,    Extremity/Skin:-  pedal pulses present, left upper extremity swelling, warmth, tenderness and erythema is improving,  no open draining wounds, chronic lower extremity edema, chronic severe venous stasis dermatitis type changes of both lower extremities Psych-affect is appropriate, oriented x3 Neuro-generalized weakness and deconditioning, no new focal deficits, no tremors   Data Review:   Micro Results Recent Results (from the past 240 hour(s))  SARS CORONAVIRUS 2 (TAT 6-24 HRS) Nasopharyngeal Nasopharyngeal Swab     Status: None   Collection Time: 03/08/20 11:30 PM   Specimen: Nasopharyngeal Swab  Result Value Ref Range Status   SARS Coronavirus 2 NEGATIVE NEGATIVE Final    Comment: (NOTE) SARS-CoV-2 target nucleic acids are NOT DETECTED. The SARS-CoV-2 RNA is generally detectable in upper and lower respiratory specimens during the acute phase of infection. Negative results do not preclude SARS-CoV-2 infection, do not rule out co-infections with other pathogens, and  should not be used as the sole basis for treatment or other patient management decisions. Negative results must be combined with clinical observations, patient history, and epidemiological information. The expected result is Negative. Fact Sheet for Patients: HairSlick.no Fact Sheet for Healthcare Providers: quierodirigir.com This test is not yet approved or cleared by the [place] FDA and  has been authorized for detection and/or diagnosis of SARS-CoV-2 by FDA under an Emergency Use Authorization (EUA). This EUA will remain  in effect (meaning this test can be used) for the duration of the COVID-19 declaration under Section 56 4(b)(1) of the Act, 21 U.S.C. section 360bbb-3(b)(1), unless the authorization is terminated or revoked sooner. Performed at [facility], [address], [place], [place] [address]   Culture, blood (Routine X 2) w Reflex to ID Panel     Status: None (Preliminary result)   Collection Time: 03/09/20  6:25 PM   Specimen: Right Antecubital; Blood    Result Value Ref Range Status   Specimen Description RIGHT ANTECUBITAL  Final   Special Requests   Final    BOTTLES DRAWN AEROBIC AND ANAEROBIC Blood Culture adequate volume   Culture   Final    NO GROWTH 4 DAYS Performed at [facility], [address], [place], [place] [address]    Report Status PENDING  Incomplete  Culture, blood (Routine X 2) w Reflex to ID Panel     Status: None (Preliminary result)   Collection Time: 03/09/20  6:25 PM   Specimen: BLOOD RIGHT HAND  Result Value Ref Range Status   Specimen Description BLOOD RIGHT HAND  Final   Special Requests   Final    BOTTLES DRAWN AEROBIC ONLY Blood Culture adequate volume   Culture   Final    NO GROWTH 4 DAYS Performed at [facility], [address], [place], [place] [address]    Report Status PENDING  Incomplete  SARS CORONAVIRUS 2 (TAT 6-24 HRS) Nasopharyngeal Nasopharyngeal Swab     Status: None   Collection Time: 03/11/20  5:57 PM   Specimen: Nasopharyngeal Swab  Result Value Ref Range Status   SARS Coronavirus 2 NEGATIVE NEGATIVE Final    Comment: (NOTE) SARS-CoV-2 target nucleic acids are NOT DETECTED. The SARS-CoV-2 RNA is generally detectable in upper and lower respiratory specimens during the acute phase of infection. Negative results do not preclude SARS-CoV-2 infection, do not rule out co-infections with other pathogens, and should not be used as the sole basis for treatment or other patient management decisions. Negative results must be combined with clinical observations, patient history, and epidemiological information. The expected result is Negative. Fact Sheet for Patients: HairSlick.no Fact Sheet for Healthcare Providers: quierodirigir.com This test is not yet approved or cleared by the [place] FDA and  has been authorized for detection and/or diagnosis of SARS-CoV-2 by FDA under an Emergency Use Authorization (EUA). This EUA will remain   in effect (meaning this test can be used) for the duration of the COVID-19 declaration under Section 56 4(b)(1) of the Act, 21 U.S.C. section 360bbb-3(b)(1), unless the authorization is terminated or revoked sooner. Performed at [facility], [address], [place], [place] [address]     Radiology Reports CT ANGIO CHEST PE W OR WO CONTRAST  Result Date: 03/08/2020 CLINICAL DATA:  Hypoxia and peripheral edema EXAM: CT ANGIOGRAPHY CHEST WITH CONTRAST TECHNIQUE: Multidetector CT imaging of the chest was performed using the standard protocol during bolus administration of intravenous contrast. Multiplanar CT image reconstructions and  MIPs were obtained to evaluate the vascular anatomy. CONTRAST:  132mL OMNIPAQUE IOHEXOL 350 MG/ML SOLN COMPARISON:  Chest x-ray from earlier in the same day, CT from 04/18/2004. FINDINGS: Cardiovascular: Thoracic aorta and its branches demonstrate atherosclerotic calcifications. No aneurysmal dilatation or dissection is seen. Coronary calcifications are noted. No significant cardiac enlargement is seen. The pulmonary artery shows a normal branching pattern without intraluminal filling defect to suggest pulmonary embolism. Mediastinum/Nodes: Thoracic inlet is within normal limits. No hilar or mediastinal adenopathy is noted. The esophagus as visualized is within normal limits. Lungs/Pleura: Lungs are well aerated bilaterally. A 5 mm nodule is noted in the medial aspect of the left upper lobe best seen on image number 21 of series 6. This has increased in size from the prior exam sixteen years previous at which time it measured 1-2 mm elevation of the right hemidiaphragm is seen. This is stable in appearance from the prior exam. No other nodules are seen. Small right pleural effusion is noted with right basilar atelectasis. Upper Abdomen: Visualized abdomen shows no acute abnormality. Musculoskeletal: Changes of anasarca are noted peripherally consistent with that  described in the lower extremities. Degenerative changes of the thoracic spine are seen. No acute compression deformity is noted. Review of the MIP images confirms the above findings. IMPRESSION: No evidence of pulmonary emboli. Small right pleural effusion with right basilar atelectasis. 5 mm nodule in the left upper lobe increased in size from 16 years ago at which time it measured 1-2 mm. No follow-up needed if patient is low-risk. Non-contrast chest CT can be considered in 12 months if patient is high-risk. This recommendation follows the consensus statement: Guidelines for Management of Incidental Pulmonary Nodules Detected on CT Images: From the Fleischner Society 2017; Radiology 2017; 284:228-243. Changes of anasarca. Aortic Atherosclerosis (ICD10-I70.0). Electronically Signed   By: Inez Catalina M.D.   On: 03/08/2020 20:33   US Venous Img Upper Uni Left (DVT)  Result Date: 03/09/2020 CLINICAL DATA:  Left upper extremity edema.  Evaluate for DVT. EXAM: LEFT UPPER EXTREMITY VENOUS DOPPLER ULTRASOUND TECHNIQUE: Gray-scale sonography with graded compression, as well as color Doppler and duplex ultrasound were performed to evaluate the upper extremity deep venous system from the level of the subclavian vein and including the jugular, axillary, basilic, radial, ulnar and upper cephalic vein. Spectral Doppler was utilized to evaluate flow at rest and with distal augmentation maneuvers. COMPARISON:  None. FINDINGS: Contralateral Subclavian Vein: Respiratory phasicity is normal and symmetric with the symptomatic side. No evidence of thrombus. Normal compressibility. Internal Jugular Vein: No evidence of thrombus. Normal compressibility, respiratory phasicity and response to augmentation. Subclavian Vein: No evidence of thrombus. Normal compressibility, respiratory phasicity and response to augmentation. Axillary Vein: No evidence of thrombus. Normal compressibility, respiratory phasicity and response to  augmentation. Cephalic Vein: No evidence of thrombus. Normal compressibility, respiratory phasicity and response to augmentation. Basilic Vein: No evidence of thrombus. Normal compressibility, respiratory phasicity and response to augmentation. Brachial Veins: No evidence of thrombus. Normal compressibility, respiratory phasicity and response to augmentation. Radial Veins: No evidence of thrombus. Normal compressibility, respiratory phasicity and response to augmentation. Ulnar Veins: No evidence of thrombus. Normal compressibility, respiratory phasicity and response to augmentation. Venous Reflux:  None visualized. Other Findings: There is a moderate amount of subcutaneous edema at the level of the left arm and forearm. IMPRESSION: No evidence of DVT within the left upper extremity. Electronically Signed   By: Sandi Mariscal M.D.   On: 03/09/2020 16:16   DG CHEST PORT  1 VIEW  Result Date: 03/13/2020 CLINICAL DATA:  Dyspnea EXAM: PORTABLE CHEST 1 VIEW COMPARISON:  03/08/2020 chest radiograph. FINDINGS: Low lung volumes. Stable cardiomediastinal silhouette with top-normal heart size. No pneumothorax. Stable elevation of the right hemidiaphragm. Blunting of the right costophrenic angle. No left pleural effusion. No overt pulmonary edema. Right basilar hazy opacity. IMPRESSION: Low lung volumes. Stable elevation of the right hemidiaphragm. Hazy right basilar lung opacity, which could represent atelectasis, aspiration and/or pneumonia. New blunting of the right costophrenic angle, cannot exclude small right pleural effusion. Electronically Signed   By: Delbert PhenixJason A Poff M.D.   On: 03/13/2020 13:47   DG Chest Portable 1 View  Result Date: 03/08/2020 CLINICAL DATA:  Anasarca EXAM: PORTABLE CHEST 1 VIEW COMPARISON:  2018 FINDINGS: Stable elevation of the right hemidiaphragm. No new consolidation or edema. No significant pleural effusion. Stable cardiomediastinal contours. IMPRESSION: Stable significant elevation of the  right hemidiaphragm. No acute finding. Electronically Signed   By: Guadlupe SpanishPraneil  Patel M.D.   On: 03/08/2020 16:30   ECHOCARDIOGRAM COMPLETE  Result Date: 03/09/2020    ECHOCARDIOGRAM REPORT   Patient Name:   Georgiana ShoreMAURICE Penagos Date of Exam: 03/09/2020 Medical Rec #:  [id number]       Height:       66.0 in Accession #:    [id number]      Weight:       237.0 lb Date of Birth:  December 27, 1933      BSA:          2.149 m Patient Age:    86 years        BP:           106/83 mmHg Patient Gender: M               HR:           97 bpm. [facility] Procedure: 2D Echo Indications:    Swelling of both lower extremities  History:        Patient has no prior history of Echocardiogram examinations.                 Arrythmias:Atrial Fibrillation; Risk Factors:Former Smoker and                 Hypertension. Cellulitis.  Sonographer:    Jeryl ColumbiaJohanna Elliott RDCS (AE) Referring Phys: [id number] Heloise BeechamEJIROGHENE E [facility] CenterEMOKPAE IMPRESSIONS  1. Left ventricular ejection fraction, by estimation, is 60 to 65%. The left ventricle has normal function. The left ventricle has no regional wall motion abnormalities. There is mild concentric left ventricular hypertrophy. Left ventricular diastolic parameters were normal. There is the interventricular septum is flattened in systole, consistent with right ventricular pressure overload.  2. Right ventricular systolic function is mildly reduced. The right ventricular size is mildly enlarged. There is moderately elevated pulmonary artery systolic pressure.  3. Right atrial size was mildly dilated.  4. The mitral valve is degenerative. Mild mitral valve regurgitation.  5. Tricuspid valve regurgitation is moderate.  6. The aortic valve is tricuspid. Aortic valve regurgitation is not visualized. Mild aortic valve sclerosis is present, with no evidence of aortic valve stenosis.  7. The inferior vena cava is dilated in size with <50% respiratory variability, suggesting right atrial pressure of 15 mmHg. FINDINGS  Left  Ventricle: Left ventricular ejection fraction, by estimation, is 60 to 65%. The left ventricle has normal function. The left ventricle has no regional wall motion abnormalities. The left ventricular internal cavity size was normal in size.  There is  mild concentric left ventricular hypertrophy. The interventricular septum is flattened in systole, consistent with right ventricular pressure overload. Left ventricular diastolic parameters were normal. Right Ventricle: The right ventricular size is mildly enlarged. No increase in right ventricular wall thickness. Right ventricular systolic function is mildly reduced. There is moderately elevated pulmonary artery systolic pressure. The tricuspid regurgitant velocity is 2.96 m/s, and with an assumed right atrial pressure of 15 mmHg, the estimated right ventricular systolic pressure is 50.0 mmHg. Left Atrium: Left atrial size was normal in size. Right Atrium: Right atrial size was mildly dilated. Pericardium: There is no evidence of pericardial effusion. Mitral Valve: The mitral valve is degenerative in appearance. There is mild thickening of the mitral valve leaflet(s). Moderate mitral annular calcification. Mild mitral valve regurgitation. Tricuspid Valve: The tricuspid valve is grossly normal. Tricuspid valve regurgitation is moderate. Aortic Valve: The aortic valve is tricuspid. . There is mild thickening and mild calcification of the aortic valve. Aortic valve regurgitation is not visualized. Mild aortic valve sclerosis is present, with no evidence of aortic valve stenosis. Mild to moderate aortic valve annular calcification. There is mild thickening of the aortic valve. There is mild calcification of the aortic valve. Pulmonic Valve: The pulmonic valve was grossly normal. Pulmonic valve regurgitation is mild. Aorta: The aortic root is normal in size and structure. Venous: The inferior vena cava is dilated in size with less than 50% respiratory variability, suggesting  right atrial pressure of 15 mmHg. IAS/Shunts: The interatrial septum was not well visualized.  LEFT VENTRICLE PLAX 2D LVIDd:         4.19 cm  Diastology LVIDs:         2.49 cm  LV e' lateral:   13.60 cm/s LV PW:         1.29 cm  LV E/e' lateral: 8.0 LV IVS:        1.12 cm  LV e' medial:    10.10 cm/s LVOT diam:     2.00 cm  LV E/e' medial:  10.8 LVOT Area:     3.14 cm  RIGHT VENTRICLE TAPSE (M-mode): 1.3 cm LEFT ATRIUM             Index       RIGHT ATRIUM           Index LA diam:        5.00 cm 2.33 cm/m  RA Area:     22.40 cm LA Vol (A2C):   48.2 ml 22.43 ml/m RA Volume:   63.50 ml  29.54 ml/m LA Vol (A4C):   44.5 ml 20.70 ml/m LA Biplane Vol: 46.6 ml 21.68 ml/m   AORTA Ao Root diam: 2.90 cm MITRAL VALVE                TRICUSPID VALVE MV Area (PHT): 3.58 cm     TR Peak grad:   35.0 mmHg MV Decel Time: 212 msec     TR Vmax:        296.00 cm/s MR Peak grad: 46.2 mmHg MR Mean grad: 25.0 mmHg     SHUNTS MR Vmax:      340.00 cm/s   Systemic Diam: 2.00 cm MR Vmean:     236.0 cm/s MV E velocity: 108.67 cm/s Prentice Docker MD Electronically signed by Prentice Docker MD Signature Date/Time: 03/09/2020/12:59:08 PM    Final      CBC Recent Labs  Lab 03/08/20 1535 03/09/20 1610 03/10/20 1028 03/11/20 9604 03/13/20 5409  WBC 18.9* 21.1* 17.0* 16.4* 13.8*  HGB 16.7 15.7 16.4 16.0 15.1  HCT 51.1 48.5 52.2* 50.8 49.4  PLT 143* 139* 108* 100* 85*  MCV 98.1 98.0 100.4* 100.6* 103.1*  MCH 32.1 31.7 31.5 31.7 31.5  MCHC 32.7 32.4 31.4 31.5 30.6  RDW 19.8* 19.6* 20.0* 19.6* 19.1*  LYMPHSABS 0.8  --   --   --   --   MONOABS 1.3*  --   --   --   --   EOSABS 0.0  --   --   --   --   BASOSABS 0.0  --   --   --   --     Chemistries  Recent Labs  Lab 03/08/20 1535 03/08/20 1758 03/09/20 0554 03/10/20 1028 03/11/20 0427 03/13/20 0627  NA 140  --  140 140 142 140  K 3.7  --  4.2 3.7 3.6 3.7  CL 99  --  101 97* 96* 93*  CO2 31  --  31 35* 36* 40*  GLUCOSE 100*  --  76 112* 86 94  BUN 29*  --   29* 29* 26* 21  CREATININE 1.28*  --  1.24 1.25* 1.14 0.87  CALCIUM 9.4  --  9.1 8.9 8.5* 8.1*  MG  --  2.5*  --   --  2.3  --   AST 37  --  30  --   --   --   ALT 30  --  27  --   --   --   ALKPHOS 78  --  72  --   --   --   BILITOT 2.9* 2.7* 2.2*  --   --   --    ------------------------------------------------------------------------------------------------------------------ No results for input(s): CHOL, HDL, LDLCALC, TRIG, CHOLHDL, LDLDIRECT in the last 72 hours.  No results found for: HGBA1C ------------------------------------------------------------------------------------------------------------------ No results for input(s): TSH, T4TOTAL, T3FREE, THYROIDAB in the last 72 hours.  Invalid input(s): FREET3 ------------------------------------------------------------------------------------------------------------------ No results for input(s): VITAMINB12, FOLATE, FERRITIN, TIBC, IRON, RETICCTPCT in the last 72 hours.  Coagulation profile No results for input(s): INR, PROTIME in the last 168 hours.  No results for input(s): DDIMER in the last 72 hours.  Cardiac Enzymes No results for input(s): CKMB, TROPONINI, MYOGLOBIN in the last 168 hours.  Invalid input(s): CK ------------------------------------------------------------------------------------------------------------------    Component Value Date/Time   BNP 280.0 (H) 03/08/2020 1535   Shon Hale M.D on 03/13/2020 at 7:42 PM  Go to [email or web address] - for contact info  [facility] - Office  [phone]

## 2020-03-14 ENCOUNTER — Emergency Department (HOSPITAL_COMMUNITY): Payer: Medicare Other

## 2020-03-14 ENCOUNTER — Non-Acute Institutional Stay (SKILLED_NURSING_FACILITY): Payer: Medicare Other | Admitting: Adult Health

## 2020-03-14 ENCOUNTER — Other Ambulatory Visit: Payer: Self-pay

## 2020-03-14 ENCOUNTER — Encounter (HOSPITAL_COMMUNITY): Payer: Self-pay | Admitting: Emergency Medicine

## 2020-03-14 ENCOUNTER — Inpatient Hospital Stay (HOSPITAL_COMMUNITY)
Admission: EM | Admit: 2020-03-14 | Discharge: 2020-03-18 | Disposition: A | Discharge status: Skilled Nursing Facility | Payer: Medicare Other | Source: Home / Self Care | Attending: Family Medicine | Admitting: Family Medicine

## 2020-03-14 ENCOUNTER — Encounter: Payer: Self-pay | Admitting: Adult Health

## 2020-03-14 DIAGNOSIS — I482 Chronic atrial fibrillation, unspecified: Secondary | ICD-10-CM

## 2020-03-14 DIAGNOSIS — K625 Hemorrhage of anus and rectum: Secondary | ICD-10-CM

## 2020-03-14 DIAGNOSIS — Z9989 Dependence on other enabling machines and devices: Secondary | ICD-10-CM

## 2020-03-14 DIAGNOSIS — M7989 Other specified soft tissue disorders: Secondary | ICD-10-CM | POA: Diagnosis present

## 2020-03-14 DIAGNOSIS — Z7901 Long term (current) use of anticoagulants: Secondary | ICD-10-CM

## 2020-03-14 DIAGNOSIS — I5033 Acute on chronic diastolic (congestive) heart failure: Secondary | ICD-10-CM | POA: Diagnosis present

## 2020-03-14 DIAGNOSIS — G4733 Obstructive sleep apnea (adult) (pediatric): Secondary | ICD-10-CM | POA: Diagnosis not present

## 2020-03-14 DIAGNOSIS — E785 Hyperlipidemia, unspecified: Secondary | ICD-10-CM

## 2020-03-14 DIAGNOSIS — E669 Obesity, unspecified: Secondary | ICD-10-CM | POA: Diagnosis present

## 2020-03-14 DIAGNOSIS — J9601 Acute respiratory failure with hypoxia: Secondary | ICD-10-CM

## 2020-03-14 DIAGNOSIS — K5909 Other constipation: Secondary | ICD-10-CM

## 2020-03-14 DIAGNOSIS — L03114 Cellulitis of left upper limb: Secondary | ICD-10-CM

## 2020-03-14 DIAGNOSIS — R1310 Dysphagia, unspecified: Secondary | ICD-10-CM

## 2020-03-14 DIAGNOSIS — R3915 Urgency of urination: Secondary | ICD-10-CM

## 2020-03-14 DIAGNOSIS — I872 Venous insufficiency (chronic) (peripheral): Secondary | ICD-10-CM | POA: Diagnosis present

## 2020-03-14 DIAGNOSIS — K921 Melena: Secondary | ICD-10-CM | POA: Diagnosis present

## 2020-03-14 DIAGNOSIS — L0390 Cellulitis, unspecified: Secondary | ICD-10-CM | POA: Diagnosis present

## 2020-03-14 DIAGNOSIS — N401 Enlarged prostate with lower urinary tract symptoms: Secondary | ICD-10-CM

## 2020-03-14 DIAGNOSIS — K6289 Other specified diseases of anus and rectum: Secondary | ICD-10-CM

## 2020-03-14 DIAGNOSIS — R6881 Early satiety: Secondary | ICD-10-CM

## 2020-03-14 LAB — CBC WITH DIFFERENTIAL/PLATELET
Abs Immature Granulocytes: 0.12 10*3/uL — ABNORMAL HIGH (ref 0.00–0.07)
Basophils Absolute: 0 10*3/uL (ref 0.0–0.1)
Basophils Relative: 0 %
Eosinophils Absolute: 0.2 10*3/uL (ref 0.0–0.5)
Eosinophils Relative: 1 %
HCT: 52.7 % — ABNORMAL HIGH (ref 39.0–52.0)
Hemoglobin: 16.7 g/dL (ref 13.0–17.0)
Immature Granulocytes: 1 %
Lymphocytes Relative: 5 %
Lymphs Abs: 0.9 10*3/uL (ref 0.7–4.0)
MCH: 32.1 pg (ref 26.0–34.0)
MCHC: 31.7 g/dL (ref 30.0–36.0)
MCV: 101.3 fL — ABNORMAL HIGH (ref 80.0–100.0)
Monocytes Absolute: 1.8 10*3/uL — ABNORMAL HIGH (ref 0.1–1.0)
Monocytes Relative: 9 %
Neutro Abs: 16.4 10*3/uL — ABNORMAL HIGH (ref 1.7–7.7)
Neutrophils Relative %: 84 %
Platelets: 87 10*3/uL — ABNORMAL LOW (ref 150–400)
RBC: 5.2 MIL/uL (ref 4.22–5.81)
RDW: 19.3 % — ABNORMAL HIGH (ref 11.5–15.5)
WBC: 19.4 10*3/uL — ABNORMAL HIGH (ref 4.0–10.5)
nRBC: 0 % (ref 0.0–0.2)

## 2020-03-14 LAB — TYPE AND SCREEN
ABO/RH(D): O POS
Antibody Screen: NEGATIVE

## 2020-03-14 LAB — CULTURE, BLOOD (ROUTINE X 2)
Culture: NO GROWTH
Culture: NO GROWTH
Special Requests: ADEQUATE
Special Requests: ADEQUATE

## 2020-03-14 LAB — COMPREHENSIVE METABOLIC PANEL
ALT: 21 U/L (ref 0–44)
AST: 23 U/L (ref 15–41)
Albumin: 2.7 g/dL — ABNORMAL LOW (ref 3.5–5.0)
Alkaline Phosphatase: 74 U/L (ref 38–126)
Anion gap: 7 (ref 5–15)
BUN: 20 mg/dL (ref 8–23)
CO2: 39 mmol/L — ABNORMAL HIGH (ref 22–32)
Calcium: 8.3 mg/dL — ABNORMAL LOW (ref 8.9–10.3)
Chloride: 94 mmol/L — ABNORMAL LOW (ref 98–111)
Creatinine, Ser: 0.74 mg/dL (ref 0.61–1.24)
GFR calc Af Amer: >60 mL/min (ref >60)
GFR calc non Af Amer: >60 mL/min (ref >60)
Glucose, Bld: 114 mg/dL — ABNORMAL HIGH (ref 70–99)
Potassium: 3.9 mmol/L (ref 3.5–5.1)
Sodium: 140 mmol/L (ref 135–145)
Total Bilirubin: 2.8 mg/dL — ABNORMAL HIGH (ref 0.3–1.2)
Total Protein: 5.7 g/dL — ABNORMAL LOW (ref 6.5–8.1)

## 2020-03-14 LAB — RESPIRATORY PANEL BY RT PCR (FLU A&B, COVID)
Influenza A by PCR: NEGATIVE
Influenza B by PCR: NEGATIVE
SARS Coronavirus 2 by RT PCR: NEGATIVE

## 2020-03-14 LAB — PROTIME-INR
INR: 1.6 — ABNORMAL HIGH (ref 0.8–1.2)
Prothrombin Time: 18.5 seconds — ABNORMAL HIGH (ref 11.4–15.2)

## 2020-03-14 LAB — SARS CORONAVIRUS 2 (TAT 6-24 HRS): SARS Coronavirus 2: NEGATIVE

## 2020-03-14 MED ORDER — HYDROCODONE-ACETAMINOPHEN 5-325 MG PO TABS: 1.0000 | ORAL_TABLET | Freq: Four times a day (QID) | ORAL | 0 refills | Status: DC | PRN

## 2020-03-14 MED ORDER — TORSEMIDE 20 MG PO TABS
20.0000 mg | ORAL_TABLET | Freq: Every day | ORAL | Status: DC
  Administered 2020-03-15 – 2020-03-18 (×4): 20 mg via ORAL
  Filled 2020-03-14 (×5): qty 1

## 2020-03-14 MED ORDER — ERYTHROMYCIN 5 MG/GM OP OINT: TOPICAL_OINTMENT | Freq: Three times a day (TID) | OPHTHALMIC | 0 refills | Status: DC

## 2020-03-14 MED ORDER — SALINE SPRAY 0.65 % NA SOLN: 2.0000 | Freq: Three times a day (TID) | NASAL | 0 refills | Status: DC

## 2020-03-14 MED ORDER — METOPROLOL SUCCINATE ER 25 MG PO TB24
12.5000 mg | ORAL_TABLET | Freq: Every day | ORAL | Status: DC
  Administered 2020-03-15 – 2020-03-18 (×5): 12.5 mg via ORAL
  Filled 2020-03-14 (×5): qty 1

## 2020-03-14 MED ORDER — GUAIFENESIN ER 600 MG PO TB12: 600.0000 mg | ORAL_TABLET | Freq: Two times a day (BID) | ORAL | 0 refills | Status: DC

## 2020-03-14 MED ORDER — POTASSIUM CHLORIDE CRYS ER 10 MEQ PO TBCR
10.0000 meq | EXTENDED_RELEASE_TABLET | Freq: Every day | ORAL | Status: DC
  Administered 2020-03-15 – 2020-03-18 (×4): 10 meq via ORAL
  Filled 2020-03-14 (×4): qty 1

## 2020-03-14 MED ORDER — HYDROCORTISONE 1 % EX CREA: 1.0000 "application " | TOPICAL_CREAM | Freq: Three times a day (TID) | CUTANEOUS | 0 refills | Status: DC | PRN

## 2020-03-14 MED ORDER — SENNOSIDES-DOCUSATE SODIUM 8.6-50 MG PO TABS: 2.0000 | ORAL_TABLET | Freq: Every day | ORAL | 1 refills | Status: DC

## 2020-03-14 MED ORDER — AMOXICILLIN-POT CLAVULANATE 875-125 MG PO TABS
1.0000 | ORAL_TABLET | Freq: Two times a day (BID) | ORAL | Status: DC
  Administered 2020-03-15 – 2020-03-18 (×8): 1 via ORAL
  Filled 2020-03-14 (×8): qty 1

## 2020-03-14 MED ORDER — TAMSULOSIN HCL 0.4 MG PO CAPS
0.4000 mg | ORAL_CAPSULE | Freq: Every day | ORAL | Status: DC
  Administered 2020-03-15 – 2020-03-18 (×5): 0.4 mg via ORAL
  Filled 2020-03-14 (×5): qty 1

## 2020-03-14 MED ORDER — TORSEMIDE 20 MG PO TABS: 20.0000 mg | ORAL_TABLET | Freq: Every day | ORAL | 1 refills | Status: DC

## 2020-03-14 MED ORDER — ROSUVASTATIN CALCIUM 10 MG PO TABS
10.0000 mg | ORAL_TABLET | Freq: Every day | ORAL | Status: DC
  Administered 2020-03-15 – 2020-03-17 (×3): 10 mg via ORAL
  Filled 2020-03-14 (×4): qty 1

## 2020-03-14 MED ORDER — HYDROXYZINE HCL 25 MG PO TABS: 25.0000 mg | ORAL_TABLET | Freq: Three times a day (TID) | ORAL | 0 refills | Status: DC | PRN

## 2020-03-14 MED ORDER — OMEGA-3-ACID ETHYL ESTERS 1 G PO CAPS
2.0000 g | ORAL_CAPSULE | Freq: Every day | ORAL | Status: DC
  Administered 2020-03-15 – 2020-03-18 (×4): 2 g via ORAL
  Filled 2020-03-14 (×4): qty 2

## 2020-03-14 MED ORDER — ALBUTEROL SULFATE (2.5 MG/3ML) 0.083% IN NEBU
3.0000 mL | INHALATION_SOLUTION | Freq: Four times a day (QID) | RESPIRATORY_TRACT | Status: DC | PRN
  Administered 2020-03-15: 3 mL via RESPIRATORY_TRACT
  Filled 2020-03-14: qty 3

## 2020-03-14 MED ORDER — IOHEXOL 300 MG/ML  SOLN
100.0000 mL | Freq: Once | INTRAMUSCULAR | Status: AC | PRN
  Administered 2020-03-14: 100 mL via INTRAVENOUS

## 2020-03-14 MED ORDER — PANTOPRAZOLE SODIUM 40 MG IV SOLR
40.0000 mg | Freq: Once | INTRAVENOUS | Status: AC
  Administered 2020-03-14: 40 mg via INTRAVENOUS
  Filled 2020-03-14: qty 40

## 2020-03-14 MED ORDER — ENSURE ENLIVE PO LIQD: 237.0000 mL | Freq: Two times a day (BID) | ORAL | 1 refills | Status: DC

## 2020-03-14 MED ORDER — ERYTHROMYCIN 5 MG/GM OP OINT
TOPICAL_OINTMENT | Freq: Three times a day (TID) | OPHTHALMIC | Status: DC
  Filled 2020-03-14: qty 3.5

## 2020-03-14 MED ORDER — POLYETHYLENE GLYCOL 3350 17 G PO PACK: 17.0000 g | PACK | Freq: Every day | ORAL | 0 refills | Status: DC

## 2020-03-14 MED ORDER — HYDROXYZINE HCL 25 MG PO TABS: 25.0000 mg | ORAL_TABLET | Freq: Three times a day (TID) | ORAL | Status: DC | PRN

## 2020-03-14 MED ORDER — VITAMIN E 180 MG (400 UNIT) PO CAPS
800.0000 [IU] | ORAL_CAPSULE | Freq: Every day | ORAL | Status: DC
  Administered 2020-03-15 – 2020-03-18 (×4): 800 [IU] via ORAL
  Filled 2020-03-14 (×4): qty 2

## 2020-03-14 MED ORDER — AMOXICILLIN-POT CLAVULANATE 875-125 MG PO TABS: 1.0000 | ORAL_TABLET | Freq: Two times a day (BID) | ORAL | 0 refills | Status: DC

## 2020-03-14 MED ORDER — ENSURE ENLIVE PO LIQD
237.0000 mL | Freq: Two times a day (BID) | ORAL | Status: DC
  Administered 2020-03-15 – 2020-03-18 (×6): 237 mL via ORAL

## 2020-03-14 MED ORDER — LEVALBUTEROL HCL 0.63 MG/3ML IN NEBU: 0.6300 mg | INHALATION_SOLUTION | Freq: Two times a day (BID) | RESPIRATORY_TRACT | 1 refills | Status: DC

## 2020-03-14 MED ORDER — METOPROLOL SUCCINATE ER 25 MG PO TB24: 12.5000 mg | ORAL_TABLET | Freq: Every day | ORAL | 2 refills | Status: DC

## 2020-03-14 MED ORDER — SALINE SPRAY 0.65 % NA SOLN
2.0000 | Freq: Three times a day (TID) | NASAL | Status: DC
  Administered 2020-03-15 – 2020-03-18 (×10): 2 via NASAL
  Filled 2020-03-14 (×2): qty 44

## 2020-03-14 MED ORDER — ACETAMINOPHEN 325 MG PO TABS: 650.0000 mg | ORAL_TABLET | Freq: Four times a day (QID) | ORAL | 2 refills | Status: AC | PRN

## 2020-03-14 MED ORDER — POLYETHYLENE GLYCOL 3350 17 G PO PACK
17.0000 g | PACK | Freq: Every day | ORAL | Status: DC
  Administered 2020-03-15: 17 g via ORAL
  Filled 2020-03-14: qty 1

## 2020-03-14 MED ORDER — ACETAMINOPHEN 325 MG PO TABS
650.0000 mg | ORAL_TABLET | Freq: Four times a day (QID) | ORAL | Status: DC | PRN
  Administered 2020-03-16 (×2): 650 mg via ORAL
  Filled 2020-03-14 (×2): qty 2

## 2020-03-14 MED ORDER — SODIUM CHLORIDE 0.9 % IV SOLN: INTRAVENOUS | Status: DC

## 2020-03-14 NOTE — H&P (Signed)
History and Physical    [name] QTM:[id number] DOB: [birth year] DOA: 03/14/2020  PCP: [name], MD (Confirm with patient/family/NH records and if not entered, this has to be entered at [facility] point of entry) Patient coming from: SNF  I have personally briefly reviewed patient's old medical records in [facility]  Chief Complaint: Rectal bleeding  HPI: Jesus Fowler is a 86 y.o. male with medical history significant of recent hospitalization for anasarca and cellulitis of the UE. He has chronic diastolic HFpEF and responded to diuretic therapy. He was treated for cellulitis with abx and d/c'd today on augmentin. At the SNF facility he was found by admitting APP to have rectal bleeding. He was sent to [facility] for evaluation.  (For level 3, the HPI must include 4+ descriptors: Location, Quality, Severity, Duration, Timing, Context, modifying factors, associated signs/symptoms and/or status of 3+ chronic problems.)  (Please avoid self-populating past medical history here) (The initial 2-3 lines should be focused and good to copy and paste in the HPI section of the daily progress note).  ED Course: Hemodynamically stable. EDP on exam found frank blood in the rectal vault on digital exam. Lab revealed stable Hgb, leukocytosis. CT revealed proctititis and sigmoid diverticulosis w/o evidence of inflammation. GI consulted who will see 3/16  recommended admission by [facility].   Review of Systems: As per HPI otherwise 10 point review of systems negative.  Unacceptable ROS statements: "10 systems reviewed," "Extensive" (without elaboration).  Acceptable ROS statements: "All others negative," "All others reviewed and are negative," and "All others unremarkable," with at LEAST ONE ROS documented Can't double dip - if using for HPI can't use for ROS  Past Medical History:  Diagnosis Date  . A-fib (HCC)   . Cellulitis   . Hemorrhoid   . Hypertension   . Prostate disorder     History  reviewed. No pertinent surgical history.  SocHx- on his third marriage x 18 years. Has 3 children and several grandchildren. His son lives in [place], [place] but is here visiting and is primary contact. 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 Lives with his wife.   reports that he has quit smoking. He has never used smokeless tobacco. He reports that he does not drink alcohol or use drugs.  No Known Allergies  History reviewed. No pertinent family history. Unacceptable: Noncontributory, unremarkable, or negative. Acceptable: Family history reviewed and not pertinent (If you reviewed it)  Prior to Admission medications   Medication Sig Start Date End Date Taking? Authorizing Provider  acetaminophen (TYLENOL) 325 MG tablet Take 2 tablets (650 mg total) by mouth every 6 (six) hours as needed for mild pain, fever or headache (or Fever >/= 101). 03/14/20  Yes Emokpae, Courage, MD  albuterol (VENTOLIN HFA) 108 (90 Base) MCG/ACT inhaler Inhale 2 puffs into the lungs every 6 (six) hours as needed for wheezing or shortness of breath.   Yes [provider]  amoxicillin-clavulanate (AUGMENTIN) 875-125 MG tablet Take 1 tablet by mouth 2 (two) times daily for 3 days. 03/14/20 03/17/20 Yes Emokpae, Courage, MD  ascorbic acid (VITAMIN C) 500 MG tablet Take 1,000 mg by mouth daily.   Yes [provider]  Cholecalciferol (VITAMIN D3) 125 MCG (5000 UT) CAPS Take 15,000 Units by mouth daily.   Yes [provider]  ELIQUIS 5 MG TABS tablet Take 2.5 mg by mouth 2 (two) times daily.  01/06/20  Yes [provider]  erythromycin ophthalmic ointment Place into both eyes  3 (three) times daily for 3 days. 03/14/20 03/17/20 Yes Emokpae, Courage, MD  feeding supplement, ENSURE ENLIVE, (ENSURE ENLIVE) LIQD Take 237 mLs by mouth 2 (two) times daily between meals. 03/14/20  Yes Emokpae, Courage, MD  HYDROcodone-acetaminophen (NORCO/VICODIN) 5-325 MG tablet Take 1 tablet by mouth every  6 (six) hours as needed for moderate pain. 03/14/20  Yes Emokpae, Courage, MD  hydrocortisone cream 1 % Apply 1 application topically 3 (three) times daily as needed for itching (minor skin irritation). 03/14/20  Yes Roxan Hockey, MD  hydrOXYzine (ATARAX/VISTARIL) 25 MG tablet Take 1 tablet (25 mg total) by mouth 3 (three) times daily as needed for anxiety. 03/14/20  Yes Emokpae, Courage, MD  metoprolol succinate (TOPROL-XL) 25 MG 24 hr tablet Take 0.5 tablets (12.5 mg total) by mouth daily. 03/15/20  Yes Roxan Hockey, MD  NON FORMULARY May use CPAP from home with home settings. At Bedtime   Yes [provider]  NON FORMULARY Dysphagia 2 thin liquids   Yes [provider]  Omega-3 Fatty Acids (FISH OIL BURP-LESS PO) Take 2 capsules by mouth daily.   Yes [provider]  OXYGEN Inhale 2 L into the lungs continuous.   Yes [provider]  polyethylene glycol (MIRALAX) 17 g packet Take 17 g by mouth daily. 03/14/20  Yes Emokpae, Courage, MD  potassium chloride (KLOR-CON) 10 MEQ tablet Take 10 mEq by mouth daily. 03/07/20  Yes [provider]  Probiotic Product (RISA-BID PROBIOTIC PO) Take 1 capsule by mouth daily.   Yes [provider]  rosuvastatin (CRESTOR) 10 MG tablet Take 10 mg by mouth in the morning.  02/07/20  Yes [provider]  senna-docusate (SENOKOT-S) 8.6-50 MG tablet Take 2 tablets by mouth at bedtime. 03/14/20 03/14/21 Yes Emokpae, Courage, MD  sodium chloride (OCEAN) 0.65 % SOLN nasal spray Place 2 sprays into both nostrils 3 (three) times daily. 03/14/20  Yes Emokpae, Courage, MD  torsemide (DEMADEX) 20 MG tablet Take 1 tablet (20 mg total) by mouth daily. 03/14/20  Yes Emokpae, Courage, MD  Vitamin E 400 units TABS Take 2 tablets by mouth daily.    Yes [provider]  tamsulosin (FLOMAX) 0.4 MG CAPS capsule Take 0.4 mg by mouth daily. 02/07/20   [provider]    Physical Exam: Vitals:   03/14/20 1930  03/14/20 2000 03/14/20 2030 03/14/20 2100  BP: 123/66 115/76 (!) 141/82 118/75  Pulse:  95    Resp: 12 13 (!) 27 16  Temp:      TempSrc:      SpO2:  93%    Weight:      Height:        Constitutional: NAD, calm, comfortable Vitals:   03/14/20 1930 03/14/20 2000 03/14/20 2030 03/14/20 2100  BP: 123/66 115/76 (!) 141/82 118/75  Pulse:  95    Resp: 12 13 (!) 27 16  Temp:      TempSrc:      SpO2:  93%    Weight:      Height:       General: chronically ill appearing elderly man in no distress Eyes: PERRL, Ectropion lower eyelid right,  conjunctivae normal ENMT: Mucous membranes are moist. Posterior pharynx clear of any exudate or lesions Neck: normal, supple, no masses Respiratory: clear to auscultation bilaterally, no wheezing, no crackles. Normal respiratory effort. No accessory muscle use.  Cardiovascular: IRIR rate controlled, no murmurs / rubs / gallops. No pitting edema LE. Abdomen: no tenderness, no masses palpated.  No hepatosplenomegaly. Bowel sounds positive.  Musculoskeletal: no clubbing / cyanosis. No joint deformity upper and lower extremities. Good ROM, no contractures. Decreased muscle tone.  Skin: LE with chronic venous stasis changes with woody texture, Erythema of right wrist, olecranon fossa right, proximal Left UE.  Neurologic: CN 2-12 grossly intact.  Strength 3/5 in all 4.  Psychiatric: Normal judgment and insight. Alert and oriented x 3. Normal mood.   (Anything < 9 systems with 2 bullets each down codes to level 1) (If patient refuses exam can't bill higher level) (Make sure to document decubitus ulcers present on admission -- if possible -- and whether patient has chronic indwelling catheter at time of admission)  Labs on Admission: I have personally reviewed following labs and imaging studies  CBC: Recent Labs  Lab 03/08/20 1535 03/08/20 1535 03/09/20 0554 03/10/20 1028 03/11/20 0427 03/13/20 0627 03/14/20 1539  WBC 18.9*   < > 21.1* 17.0* 16.4*  13.8* 19.4*  NEUTROABS 16.7*  --   --   --   --   --  16.4*  HGB 16.7   < > 15.7 16.4 16.0 15.1 16.7  HCT 51.1   < > 48.5 52.2* 50.8 49.4 52.7*  MCV 98.1   < > 98.0 100.4* 100.6* 103.1* 101.3*  PLT 143*   < > 139* 108* 100* 85* 87*   < > = values in this interval not displayed.   Basic Metabolic Panel: Recent Labs  Lab 03/08/20 1535 03/08/20 1758 03/09/20 0554 03/10/20 1028 03/11/20 0427 03/13/20 0627 03/14/20 1539  NA   < >  --  140 140 142 140 140  K   < >  --  4.2 3.7 3.6 3.7 3.9  CL   < >  --  101 97* 96* 93* 94*  CO2   < >  --  31 35* 36* 40* 39*  GLUCOSE   < >  --  76 112* 86 94 114*  BUN   < >  --  29* 29* 26* 21 20  CREATININE   < >  --  1.24 1.25* 1.14 0.87 0.74  CALCIUM   < >  --  9.1 8.9 8.5* 8.1* 8.3*  MG  --  2.5*  --   --  2.3  --   --    < > = values in this interval not displayed.   GFR: Estimated Creatinine Clearance: 76.2 mL/min (by C-G formula based on SCr of 0.74 mg/dL). Liver Function Tests: Recent Labs  Lab 03/08/20 1535 03/08/20 1758 03/09/20 0554 03/14/20 1539  AST 37  --  30 23  ALT 30  --  27 21  ALKPHOS 78  --  72 74  BILITOT 2.9* 2.7* 2.2* 2.8*  PROT 6.6  --  5.9* 5.7*  ALBUMIN 3.2*  --  2.8* 2.7*   No results for input(s): LIPASE, AMYLASE in the last 168 hours. No results for input(s): AMMONIA in the last 168 hours. Coagulation Profile: Recent Labs  Lab 03/14/20 1539  INR 1.6*   Cardiac Enzymes: No results for input(s): CKTOTAL, CKMB, CKMBINDEX, TROPONINI in the last 168 hours. BNP (last 3 results) No results for input(s): PROBNP in the last 8760 hours. HbA1C: No results for input(s): HGBA1C in the last 72 hours. CBG: No results for input(s): GLUCAP in the last 168 hours. Lipid Profile: No results for input(s): CHOL, HDL, LDLCALC, TRIG, CHOLHDL, LDLDIRECT in the last 72 hours. Thyroid Function Tests: No results for input(s): TSH, T4TOTAL, FREET4, T3FREE,  THYROIDAB in the last 72 hours. Anemia Panel: No results for  input(s): VITAMINB12, FOLATE, FERRITIN, TIBC, IRON, RETICCTPCT in the last 72 hours. Urine analysis: No results found for: COLORURINE, APPEARANCEUR, LABSPEC, PHURINE, GLUCOSEU, HGBUR, BILIRUBINUR, KETONESUR, PROTEINUR, UROBILINOGEN, NITRITE, LEUKOCYTESUR  Radiological Exams on Admission: CT ABDOMEN PELVIS W CONTRAST  Result Date: 03/14/2020 CLINICAL DATA:  Lower gastrointestinal bleed. EXAM: CT ABDOMEN AND PELVIS WITH CONTRAST TECHNIQUE: Multidetector CT imaging of the abdomen and pelvis was performed using the standard protocol following bolus administration of intravenous contrast. CONTRAST:  OMNIPAQUE IOHEXOL 300 MG/ML  SOLN COMPARISON:  None. FINDINGS: Lower chest: Small pleural effusions bilaterally, larger on the right. Subpleural compressive atelectasis in both lungs. Right hemidiaphragm elevation. Mild cardiomegaly. Four-vessel moderate to severe coronary calcification most pronounced along the LAD. Pulmonary artery hypertension, the pulmonary trunk measuring 4.6 cm diameter. Tiny paraesophageal hernia. Hepatobiliary: Serpiginous lobular enhancement in the medial right hepatic dome measuring up to 24 mm diameter and isoenhancing with the hepatic vasculature, likely intraparenchymal varices. Fatty infiltration of the hepatic parenchyma. Normal gallbladder and biliary tree. Pancreas: Mild fatty infiltration. No acute pancreatitis. No apparent dilatation of the main pancreatic duct. Spleen: A 12 mm ovoid enhancement within the splenic parenchyma. Adrenals/Urinary Tract: Normal bilateral adrenal glands. Simple appearing bilateral renal cortical cyst measuring up to 2.5 cm on the left and 17 Hounsfield units. No right or left hydronephrosis. Normal bilateral ureters. Symmetrical mild bilateral perinephric inflammatory change, likely sequela of medical renal disease. A nonobstructing 4 mm left renal lower pole calculus. No apparent urinary bladder abnormality. Stomach/Bowel: Large stool burden without  bowel obstruction. Normal appendix, coronal series 5, image 32. Diffuse mild circumferential rectal wall thickening with inflammatory change in the perirectal fat planes and small amount of 5 Hounsfield unit free fluid in the pre sacral space. Apparent short segment mild mucosal thickening of a left lower abdominal quadrant jejunal bowel loop, coronal series 5, image 31, and apparent diffuse gastric mucosal thickening which could be due entirely to nondistention by enteric contrast. No apparent colonic wall thickening. Mild sigmoid diverticulosis coli. No apparent colonic wall thickening. Redundant sigmoid colon with adhesion formation to the right upper and lower abdominal walls, but no volvulus. Reflux of stool into the terminal ileum. No convincing evidence for contrast extravasation into the colonic bowel lumen. Vascular/Lymphatic: Aorto bi-iliac calcified atherosclerosis without an abdominal aorta aneurysm. Nonspecific bilateral inguinal, retroperitoneal and mesenteric lymph nodes. Reproductive: Scoliosis and moderate skeletal degenerative changes. Other: Small periumbilical and bilateral inguinal herniations of fat without strangulation or incarceration. Extensive anasarca above and below the waist. Musculoskeletal: Scoliosis and moderate skeletal degenerative changes. Mild diffuse bone demineralization. Grade 1 anterolisthesis of L5 on S1, likely degenerative. IMPRESSION: An acute mild uncomplicated proctitis with small amount of free fluid in the presacral space and edema in the perirectal fat plane without perforation or abscess. Large stool burden throughout the colon. Sigmoid diverticulosis without acute diverticulitis or contrast extravasation within the colonic lumen. Apparent mild mucosal thickening of the gastric lumen and a left lower abdominal quadrant small bowel loop, which could be secondary to incomplete distension or a mild gastroenteritis. Abnormal enhancement in the medial right hepatic dome  and in the splenic parenchyma, possibly intrahepatic varices and splenic hemangioma, new or not apparent on the March 08, 2020 and April 18, 2004 CT chest studies. Mild cardiomegaly with four-vessel moderate to severe coronary calcifications, small bilateral pleural effusions and bibasilar compressive atelectasis. Extensive anasarca. Nonobstructing small left renal calculus. Aortic calcified atherosclerosis. Pulmonary artery hypertension. Electronically  Signed   By: Laurence Ferrari   On: 03/14/2020 18:17   DG CHEST PORT 1 VIEW  Result Date: 03/13/2020 CLINICAL DATA:  Dyspnea EXAM: PORTABLE CHEST 1 VIEW COMPARISON:  03/08/2020 chest radiograph. FINDINGS: Low lung volumes. Stable cardiomediastinal silhouette with top-normal heart size. No pneumothorax. Stable elevation of the right hemidiaphragm. Blunting of the right costophrenic angle. No left pleural effusion. No overt pulmonary edema. Right basilar hazy opacity. IMPRESSION: Low lung volumes. Stable elevation of the right hemidiaphragm. Hazy right basilar lung opacity, which could represent atelectasis, aspiration and/or pneumonia. New blunting of the right costophrenic angle, cannot exclude small right pleural effusion. Electronically Signed   By: Delbert Phenix M.D.   On: 03/13/2020 13:47    EKG: Independently reviewed. No EKG since 03/11/20  Assessment/Plan Active Problems:   Hematochezia   Atrial fibrillation, chronic (HCC)   Swelling of both lower extremities   Acute on chronic heart failure with preserved ejection fraction (HFpEF) /Diastolic Dysfunction   OSA on CPAP  (please populate well all problems here in Problem List. (For example, if patient is on BP meds at home and you resume or decide to hold them, it is a problem that needs to be her. Same for CAD, COPD, HLD and so on)   1. Hematochezia - sudden onset of painless hematochezia with diverticulosis of sigmoid colon on CT scan suggestive of diverticular bleed. No active bleed at this time.  Hgb remained stable. Plan Med-surg observation admission  F/u H/H\  GI has been called by ED-P and will see patient.   Chronic constipation - if flex sig or colonoscopy needed will need extensive    Bowel prep.  2. Leukocytosis - resurgent leukocytosis. Had been switched to augmentin at time of discharge to SNF. No fever. Differential is not remarkable Plan Continue augmentin  3. A. Fib - rate controlled. NOAC stopped in light of active bleed Plan Continue meds as at discharge  4. Heart failure - last echo 03/09/20 with preserved EF, LVH and normal diastolic function. Clinically the patient was fluid overloaded at last admission. Plan Continue diuretic  5. TOC - was for SNF. ONce stable he is to return to SNF. Will not repeat PT/OT eval.  DVT prophylaxis: SCDs (Lovenox/Heparin/SCD's/anticoagulated/None (if comfort care) Code Status: full code (Full/Partial (specify details) Family Communication: Spoke with son, explained working diagnosis and tx plan (Specify name, relationship. Do not write "discussed with patient". Specify tel # if discussed over the phone) Disposition Plan: SNF when medically stable (specify when and where you expect patient to be discharged) Consults called:  GI-Dr. Jena Gauss  (with names) Admission status: observation (inpatient / obs / tele / medical floor / SDU)   Illene Regulus MD [facility] Pager [phone]  If 7PM-7AM, please contact night-coverage [email or web address] Password [facility]  03/14/2020, 9:48 PM

## 2020-03-14 NOTE — TOC Transition Note (Signed)
Transition of Care [facility]) - CM/SW Discharge Note   Patient Details  Name: Jesus Fowler MRN: [id number] Date of Birth: 25-May-1933  [facility] CM/SW Contact:  Johndavid Geralds, Chrystine Oiler, RN Phone Number: 03/14/2020, 12:22 PM   Clinical Narrative:   Patient discharging to [facility] today, after covid test results. DC clinicals loaded via HUB.       Barriers to Discharge: Continued Medical Work up   Patient Goals and CMS Choice Patient states their goals for this hospitalization and ongoing recovery are:: feel better CMS Medicare.gov Compare Post Acute Care list provided to:: Patient Represenative (must comment) Choice offered to / list presented to : Spouse  Discharge Placement PASRR number recieved: 03/11/20            Patient chooses bed at: [facility] Patient to be transferred to facility by: wheelchair      Discharge Plan and Services In-house Referral: Clinical Social Work   Post Acute Care Choice: Skilled Nursing Facility                               Social Determinants of Health (SDOH) Interventions     Readmission Risk Interventions Readmission Risk Prevention Plan 03/11/2020  Medication Screening Complete  Transportation Screening Complete  Some recent data might be hidden

## 2020-03-14 NOTE — ED Triage Notes (Signed)
PT brought over to ED for eval today from [facility] for dark red movement noted on sheets. PT was d/c from [facility] today and upon arrival when [facility] staff moved him to his bed they noticed blood and went and got the NP at the facility to eval the patient and received an order to send to ED for eval.

## 2020-03-14 NOTE — Discharge Summary (Signed)
Jesus Fowler, is a 86 y.o. male  DOB 14-Aug-1933  MRN [id number].  Admission date:  03/08/2020  Admitting Physician  Onnie Boer, MD  Discharge Date:  03/14/2020   Primary MD  [name], MD  Recommendations for primary care physician for things to follow:   1)Discharge to SNF rehab on 2 L of oxygen via nasal cannula continuously 2)Please use CPAP nightly 3) CBC and BMP test every Thursday for 2 weeks 4)Patient is on Eliquis/Apixban so Avoid ibuprofen/Advil/Aleve/Motrin/Goody Powders/Naproxen/BC powders/Meloxicam/Diclofenac/Indomethacin and other Nonsteroidal anti-inflammatory medications as these will make you more likely to bleed and can cause stomach ulcers, can also cause Kidney problems.  5)Dysphagia 2 (Fine chop);Thin liquid, Pt benefits from liquid wash during PO consumption.  Admission Diagnosis  Anasarca [R60.1] Swelling of both lower extremities [M79.89]   Discharge Diagnosis  Anasarca [R60.1] Swelling of both lower extremities [M79.89]    Principal Problem:   Acute respiratory failure with hypoxia (HCC) Active Problems:   Atrial fibrillation, chronic (HCC)   Anticoagulated for Afib   Acute on chronic heart failure with preserved ejection fraction (HFpEF) /Diastolic Dysfunction   Swelling of lower extremity   Cellulitis   Venous stasis dermatitis of both lower extremities   Constipated   Swelling of both lower extremities   OSA on CPAP   Obesity (BMI 30-39.9)      Past Medical History:  Diagnosis Date  . A-fib (HCC)   . Cellulitis   . Hemorrhoid   . Hypertension   . Prostate disorder     History reviewed. No pertinent surgical history.   HPI  from the history and physical done on the day of admission:    Chief Complaint: Generalized swelling  HPI: Jesus Fowler is a 86 y.o. male with medical history significant for atrial fibrillation, hypertension.   Patient presented to the ED with complaints of generalized abdominal swelling involving his bilateral legs, abdomen, bilateral upper extremity over the past several weeks.  Not exactly sure but he tells me his baseline weight is about 198 pounds. He also had some redness to his right upper extremity, he is unable to tell me how long this has been going on, he tells me he noticed this when he was told today in the ED. Mild occasional itching, but no pain, no redness.  Was prescribed an antibiotic for this yesterday which he took.  He reports a very mild chronic unchanged cough, mostly in the mornings.    He denies chest pain, reports some chronic difficulty breathing that he does not think has changed significantly.   He tells me he used to be on 2 L of oxygen in the past but this was discontinued a long time ago as he no longer needed it.  He also used to be on Lasix but this was discontinued also, as he no longer needed it.  Quit smoking cigarettes several years ago.  ED Course: Blood pressure systolic 104-116.  Heart rate 80s.  O2 sats down to 80s on arrival improved with 2  L nasal cannula.  WBC 18.9.  BNP 280. Hs troponin x 2 12.  Total bilirubin 2.9.  With normal liver enzymes.  EKG shows atrial fibrillation rate 101, with QTC prolonged at 412. IV Lasix 40 mg x 1 given in ED.  Hospitalist admit for further evaluation and management.    Hospital Course:    Brief Summary -86 y.o.malewith medical history significant foratrial fibrillation, hypertension. Patient presented to the ED with complaints of generalized abdominal swelling involving his bilateral legs, abdomen, bilateral upper extremity over  several weeks PTA  admitted on 03/08/2020 with dyspnea on acute hypoxic respiratory failure and concerns about left upper extremity cellulitis and possible hematoma/ecchymosis  A/p  1)Acute Hypoxic Respiratory Failure--- suspect acute on chronic HFpEF/CHF related, BNP is 280,  --Patient is a  reformed smoker so cannot exclude some component of COPD -CTA chest w/o acute PE, small right pleural effusion and right-sided atelectasis noted --... Currently requiring 2 L of oxygen via nasal cannula -PTA patient was not on home O2 -Echo with EF of 60 to 65% -Continue gentle diuresis, daily weights, fluid input and output monitoring.   --weight is down to 220 pounds from 236.99 -Fluid balance is negative -Covid Negative, repeat Covid test for  transfer to SNF rehab -Blood pressure is somewhat soft,  -Diuresed well with IV Lasix -Okay to discharge on p.o. torsemide 20 mg daily -Check BMP weekly--on Thursday for the next 2 weeks -Continue CPAP with sleep -Low clinical suspicion for aspiration pneumonia, patient is being discharged on p.o. Augmentin mostly for cellulitis, but this should cover for aspiration pneumonia as well  2)Left upper extremity cellulitis----WBC is down to 13.8 from 21.1, -- PTA patient was treated with Levaquin as outpatient, no fevers, treated with IV Rocephin, Blood Cx NGTD -Left upper extremity venous Dopplers without acute DVT -Discharged on p.o. Augmentin to complete treatment course  3)Generalized Weakness and Deconditioning-TSH 0.4, PT eval appreciated, recommends SNF rehab  4) chronic Atrial fibrillation- rates 80s to 90s. Rate controlled and anticoagulated. -Continue Eliquis for stroke prophylaxis and decrease Toprol-XL to 12.5 mg daily from 50 mg daily due to soft BP   for rate control  5)Prolonged QTC- 457 ---  Keep potassium close to 4 magnesium above 2  6)Elevated bilirubin-   T bil 2.7 to 2.9 (indirect 1.9, direct 0.8),  --LFTs WNL -Asymptomatic at this time outpatient follow-up advised.  7)Hypertension--- BP soft despite holding lisinopril/HCTZ and amlodipine, decreased Toprol-XL to 12.5 mg daily from 50 mg daily  8)BPH--- continue Flomax  9)Dysphagia-- Speech eval appreciated, recommends---Dysphagia 2 (Fine chop);Thin liquid, Pt  benefits from liquid wash during PO consumption.  Disposition--discharged to SNF rehab on Monday 03/14/2020 pending negative COVID-19 test -Discharge to SNF on p.o. torsemide, oxygen, CPAP nightly and p.o. Augmentin   Code Status : Full  Family Communication:   (patient is alert, awake and coherent) Discussed with son who is visiting from [place]  Consults  :  na DVT Prophylaxis  : eliquis/- SCDs   Discharge Condition: STABLE  Follow UP  Contact information for after-discharge care    Destination    [facility] Preferred SNF .   Service: Skilled Nursing Contact information: [address] [place] [address] [phone]               Consults obtained - nA  Diet and Activity recommendation:  As advised  Discharge Instructions    Discharge Instructions    Call MD for:  difficulty breathing, headache or visual  disturbances   Complete by: As directed    Call MD for:  persistant dizziness or light-headedness   Complete by: As directed    Call MD for:  persistant nausea and vomiting   Complete by: As directed    Call MD for:  severe uncontrolled pain   Complete by: As directed    Call MD for:  temperature >100.4   Complete by: As directed    Diet - low sodium heart healthy   Complete by: As directed    Dysphagia 2 (Fine chop);Thin liquid, Pt benefits from liquid wash during PO consumption.   Discharge instructions   Complete by: As directed    1)Discharge to SNF rehab on 2 L of oxygen via nasal cannula continuously 2)Please use CPAP nightly 3) CBC and BMP test every Thursday for 2 weeks 4)Patient is on Eliquis/Apixban so Avoid ibuprofen/Advil/Aleve/Motrin/Goody Powders/Naproxen/BC powders/Meloxicam/Diclofenac/Indomethacin and other Nonsteroidal anti-inflammatory medications as these will make you more likely to bleed and can cause stomach ulcers, can also cause Kidney problems. 5)Dysphagia 2 (Fine chop);Thin liquid, Pt benefits  from liquid wash during PO consumption.   Increase activity slowly   Complete by: As directed         Discharge Medications     Allergies as of 03/14/2020   No Known Allergies     Medication List    STOP taking these medications   amLODipine 5 MG tablet Commonly known as: NORVASC   furosemide 20 MG tablet Commonly known as: LASIX   levofloxacin 500 MG tablet Commonly known as: LEVAQUIN   lisinopril-hydrochlorothiazide 20-12.5 MG tablet Commonly known as: ZESTORETIC   predniSONE 20 MG tablet Commonly known as: DELTASONE     TAKE these medications   acetaminophen 325 MG tablet Commonly known as: TYLENOL Take 2 tablets (650 mg total) by mouth every 6 (six) hours as needed for mild pain, fever or headache (or Fever >/= 101).   amoxicillin-clavulanate 875-125 MG tablet Commonly known as: AUGMENTIN Take 1 tablet by mouth 2 (two) times daily for 3 days.   ascorbic acid 500 MG tablet Commonly known as: VITAMIN C Take 1,000 mg by mouth daily.   DIGESTOL PO Take 1 tablet by mouth daily.   Eliquis 5 MG Tabs tablet Generic drug: apixaban Take 2.5 mg by mouth 2 (two) times daily.   erythromycin ophthalmic ointment Place into both eyes 3 (three) times daily for 3 days.   feeding supplement (ENSURE ENLIVE) Liqd Take 237 mLs by mouth 2 (two) times daily between meals.   FISH OIL BURP-LESS PO Take 2 capsules by mouth daily.   guaiFENesin 600 MG 12 hr tablet Commonly known as: MUCINEX Take 1 tablet (600 mg total) by mouth 2 (two) times daily.   HYDROcodone-acetaminophen 5-325 MG tablet Commonly known as: NORCO/VICODIN Take 1 tablet by mouth every 6 (six) hours as needed for moderate pain. What changed:   when to take this  reasons to take this   hydrocortisone cream 1 % Apply 1 application topically 3 (three) times daily as needed for itching (minor skin irritation).   hydrOXYzine 25 MG tablet Commonly known as: ATARAX/VISTARIL Take 1 tablet (25 mg total)  by mouth 3 (three) times daily as needed for anxiety.   levalbuterol 0.63 MG/3ML nebulizer solution Commonly known as: XOPENEX Take 3 mLs (0.63 mg total) by nebulization 2 (two) times daily.   metoprolol succinate 25 MG 24 hr tablet Commonly known as: TOPROL-XL Take 0.5 tablets (12.5 mg total) by mouth daily. Start taking on:  March 15, 2020 What changed:   medication strength  how much to take   polyethylene glycol 17 g packet Commonly known as: MiraLax Take 17 g by mouth daily.   potassium chloride 10 MEQ tablet Commonly known as: KLOR-CON Take 10 mEq by mouth daily.   PROBIOTIC DAILY PO Take 1 tablet by mouth daily.   rosuvastatin 10 MG tablet Commonly known as: CRESTOR Take 10 mg by mouth in the morning.   senna-docusate 8.6-50 MG tablet Commonly known as: Senokot-S Take 2 tablets by mouth at bedtime.   sodium chloride 0.65 % Soln nasal spray Commonly known as: OCEAN Place 2 sprays into both nostrils 3 (three) times daily.   tamsulosin 0.4 MG Caps capsule Commonly known as: FLOMAX Take 0.4 mg by mouth daily.   torsemide 20 MG tablet Commonly known as: Demadex Take 1 tablet (20 mg total) by mouth daily.   Vitamin D3 125 MCG (5000 UT) Caps Take 15,000 Units by mouth daily.   VITAMIN E PO Take 2 tablets by mouth daily.      Major procedures and Radiology Reports - PLEASE review detailed and final reports for all details, in brief -   CT ANGIO CHEST PE W OR WO CONTRAST  Result Date: 03/08/2020 CLINICAL DATA:  Hypoxia and peripheral edema EXAM: CT ANGIOGRAPHY CHEST WITH CONTRAST TECHNIQUE: Multidetector CT imaging of the chest was performed using the standard protocol during bolus administration of intravenous contrast. Multiplanar CT image reconstructions and MIPs were obtained to evaluate the vascular anatomy. CONTRAST:  OMNIPAQUE IOHEXOL 350 MG/ML SOLN COMPARISON:  Chest x-ray from earlier in the same day, CT from 04/18/2004. FINDINGS: Cardiovascular:  Thoracic aorta and its branches demonstrate atherosclerotic calcifications. No aneurysmal dilatation or dissection is seen. Coronary calcifications are noted. No significant cardiac enlargement is seen. The pulmonary artery shows a normal branching pattern without intraluminal filling defect to suggest pulmonary embolism. Mediastinum/Nodes: Thoracic inlet is within normal limits. No hilar or mediastinal adenopathy is noted. The esophagus as visualized is within normal limits. Lungs/Pleura: Lungs are well aerated bilaterally. A 5 mm nodule is noted in the medial aspect of the left upper lobe best seen on image number 21 of series 6. This has increased in size from the prior exam sixteen years previous at which time it measured 1-2 mm elevation of the right hemidiaphragm is seen. This is stable in appearance from the prior exam. No other nodules are seen. Small right pleural effusion is noted with right basilar atelectasis. Upper Abdomen: Visualized abdomen shows no acute abnormality. Musculoskeletal: Changes of anasarca are noted peripherally consistent with that described in the lower extremities. Degenerative changes of the thoracic spine are seen. No acute compression deformity is noted. Review of the MIP images confirms the above findings. IMPRESSION: No evidence of pulmonary emboli. Small right pleural effusion with right basilar atelectasis. 5 mm nodule in the left upper lobe increased in size from 16 years ago at which time it measured 1-2 mm. No follow-up needed if patient is low-risk. Non-contrast chest CT can be considered in 12 months if patient is high-risk. This recommendation follows the consensus statement: Guidelines for Management of Incidental Pulmonary Nodules Detected on CT Images: From the Fleischner Society 2017; Radiology 2017; 284:228-243. Changes of anasarca. Aortic Atherosclerosis (ICD10-I70.0). Electronically Signed   By: Alcide Clever M.D.   On: 03/08/2020 20:33   US Venous Img Upper Uni  Left (DVT)  Result Date: 03/09/2020 CLINICAL DATA:  Left upper extremity edema.  Evaluate for DVT.  EXAM: LEFT UPPER EXTREMITY VENOUS DOPPLER ULTRASOUND TECHNIQUE: Gray-scale sonography with graded compression, as well as color Doppler and duplex ultrasound were performed to evaluate the upper extremity deep venous system from the level of the subclavian vein and including the jugular, axillary, basilic, radial, ulnar and upper cephalic vein. Spectral Doppler was utilized to evaluate flow at rest and with distal augmentation maneuvers. COMPARISON:  None. FINDINGS: Contralateral Subclavian Vein: Respiratory phasicity is normal and symmetric with the symptomatic side. No evidence of thrombus. Normal compressibility. Internal Jugular Vein: No evidence of thrombus. Normal compressibility, respiratory phasicity and response to augmentation. Subclavian Vein: No evidence of thrombus. Normal compressibility, respiratory phasicity and response to augmentation. Axillary Vein: No evidence of thrombus. Normal compressibility, respiratory phasicity and response to augmentation. Cephalic Vein: No evidence of thrombus. Normal compressibility, respiratory phasicity and response to augmentation. Basilic Vein: No evidence of thrombus. Normal compressibility, respiratory phasicity and response to augmentation. Brachial Veins: No evidence of thrombus. Normal compressibility, respiratory phasicity and response to augmentation. Radial Veins: No evidence of thrombus. Normal compressibility, respiratory phasicity and response to augmentation. Ulnar Veins: No evidence of thrombus. Normal compressibility, respiratory phasicity and response to augmentation. Venous Reflux:  None visualized. Other Findings: There is a moderate amount of subcutaneous edema at the level of the left arm and forearm. IMPRESSION: No evidence of DVT within the left upper extremity. Electronically Signed   By: Sandi Mariscal M.D.   On: 03/09/2020 16:16   DG CHEST PORT  1 VIEW  Result Date: 03/13/2020 CLINICAL DATA:  Dyspnea EXAM: PORTABLE CHEST 1 VIEW COMPARISON:  03/08/2020 chest radiograph. FINDINGS: Low lung volumes. Stable cardiomediastinal silhouette with top-normal heart size. No pneumothorax. Stable elevation of the right hemidiaphragm. Blunting of the right costophrenic angle. No left pleural effusion. No overt pulmonary edema. Right basilar hazy opacity. IMPRESSION: Low lung volumes. Stable elevation of the right hemidiaphragm. Hazy right basilar lung opacity, which could represent atelectasis, aspiration and/or pneumonia. New blunting of the right costophrenic angle, cannot exclude small right pleural effusion. Electronically Signed   By: Ilona Sorrel M.D.   On: 03/13/2020 13:47   DG Chest Portable 1 View  Result Date: 03/08/2020 CLINICAL DATA:  Anasarca EXAM: PORTABLE CHEST 1 VIEW COMPARISON:  2018 FINDINGS: Stable elevation of the right hemidiaphragm. No new consolidation or edema. No significant pleural effusion. Stable cardiomediastinal contours. IMPRESSION: Stable significant elevation of the right hemidiaphragm. No acute finding. Electronically Signed   By: Macy Mis M.D.   On: 03/08/2020 16:30   ECHOCARDIOGRAM COMPLETE  Result Date: 03/09/2020    ECHOCARDIOGRAM REPORT   Patient Name:   Jesus Fowler Date of Exam: 03/09/2020 Medical Rec #:  [id number]       Height:       66.0 in Accession #:    [id number]      Weight:       237.0 lb Date of Birth:  [birth year]      BSA:          2.149 m Patient Age:    27 years        BP:           106/83 mmHg Patient Gender: M               HR:           97 bpm. Exam Location:  [facility] Procedure: 2D Echo Indications:    Swelling of both lower extremities  History:        Patient has no  prior history of Echocardiogram examinations.                 Arrythmias:Atrial Fibrillation; Risk Factors:Former Smoker and                 Hypertension. Cellulitis.  Sonographer:    Jeryl ColumbiaJohanna Elliott RDCS (AE) Referring Phys: [id number]  Heloise BeechamEJIROGHENE E [facility] HospitalEMOKPAE IMPRESSIONS  1. Left ventricular ejection fraction, by estimation, is 60 to 65%. The left ventricle has normal function. The left ventricle has no regional wall motion abnormalities. There is mild concentric left ventricular hypertrophy. Left ventricular diastolic parameters were normal. There is the interventricular septum is flattened in systole, consistent with right ventricular pressure overload.  2. Right ventricular systolic function is mildly reduced. The right ventricular size is mildly enlarged. There is moderately elevated pulmonary artery systolic pressure.  3. Right atrial size was mildly dilated.  4. The mitral valve is degenerative. Mild mitral valve regurgitation.  5. Tricuspid valve regurgitation is moderate.  6. The aortic valve is tricuspid. Aortic valve regurgitation is not visualized. Mild aortic valve sclerosis is present, with no evidence of aortic valve stenosis.  7. The inferior vena cava is dilated in size with <50% respiratory variability, suggesting right atrial pressure of 15 mmHg. FINDINGS  Left Ventricle: Left ventricular ejection fraction, by estimation, is 60 to 65%. The left ventricle has normal function. The left ventricle has no regional wall motion abnormalities. The left ventricular internal cavity size was normal in size. There is  mild concentric left ventricular hypertrophy. The interventricular septum is flattened in systole, consistent with right ventricular pressure overload. Left ventricular diastolic parameters were normal. Right Ventricle: The right ventricular size is mildly enlarged. No increase in right ventricular wall thickness. Right ventricular systolic function is mildly reduced. There is moderately elevated pulmonary artery systolic pressure. The tricuspid regurgitant velocity is 2.96 m/s, and with an assumed right atrial pressure of 15 mmHg, the estimated right ventricular systolic pressure is 50.0 mmHg. Left Atrium: Left atrial size was  normal in size. Right Atrium: Right atrial size was mildly dilated. Pericardium: There is no evidence of pericardial effusion. Mitral Valve: The mitral valve is degenerative in appearance. There is mild thickening of the mitral valve leaflet(s). Moderate mitral annular calcification. Mild mitral valve regurgitation. Tricuspid Valve: The tricuspid valve is grossly normal. Tricuspid valve regurgitation is moderate. Aortic Valve: The aortic valve is tricuspid. . There is mild thickening and mild calcification of the aortic valve. Aortic valve regurgitation is not visualized. Mild aortic valve sclerosis is present, with no evidence of aortic valve stenosis. Mild to moderate aortic valve annular calcification. There is mild thickening of the aortic valve. There is mild calcification of the aortic valve. Pulmonic Valve: The pulmonic valve was grossly normal. Pulmonic valve regurgitation is mild. Aorta: The aortic root is normal in size and structure. Venous: The inferior vena cava is dilated in size with less than 50% respiratory variability, suggesting right atrial pressure of 15 mmHg. IAS/Shunts: The interatrial septum was not well visualized.  LEFT VENTRICLE PLAX 2D LVIDd:         4.19 cm  Diastology LVIDs:         2.49 cm  LV e' lateral:   13.60 cm/s LV PW:         1.29 cm  LV E/e' lateral: 8.0 LV IVS:        1.12 cm  LV e' medial:    10.10 cm/s LVOT diam:     2.00 cm  LV  E/e' medial:  10.8 LVOT Area:     3.14 cm  RIGHT VENTRICLE TAPSE (M-mode): 1.3 cm LEFT ATRIUM             Index       RIGHT ATRIUM           Index LA diam:        5.00 cm 2.33 cm/m  RA Area:     22.40 cm LA Vol (A2C):   48.2 ml 22.43 ml/m RA Volume:   63.50 ml  29.54 ml/m LA Vol (A4C):   44.5 ml 20.70 ml/m LA Biplane Vol: 46.6 ml 21.68 ml/m   AORTA Ao Root diam: 2.90 cm MITRAL VALVE                TRICUSPID VALVE MV Area (PHT): 3.58 cm     TR Peak grad:   35.0 mmHg MV Decel Time: 212 msec     TR Vmax:        296.00 cm/s MR Peak grad: 46.2  mmHg MR Mean grad: 25.0 mmHg     SHUNTS MR Vmax:      340.00 cm/s   Systemic Diam: 2.00 cm MR Vmean:     236.0 cm/s MV E velocity: 108.67 cm/s Prentice Docker MD Electronically signed by Prentice Docker MD Signature Date/Time: 03/09/2020/12:59:08 PM    Final    Micro Results    Recent Results (from the past 240 hour(s))  SARS CORONAVIRUS 2 (TAT 6-24 HRS) Nasopharyngeal Nasopharyngeal Swab     Status: None   Collection Time: 03/08/20 11:30 PM   Specimen: Nasopharyngeal Swab  Result Value Ref Range Status   SARS Coronavirus 2 NEGATIVE NEGATIVE Final    Comment: (NOTE) SARS-CoV-2 target nucleic acids are NOT DETECTED. The SARS-CoV-2 RNA is generally detectable in upper and lower respiratory specimens during the acute phase of infection. Negative results do not preclude SARS-CoV-2 infection, do not rule out co-infections with other pathogens, and should not be used as the sole basis for treatment or other patient management decisions. Negative results must be combined with clinical observations, patient history, and epidemiological information. The expected result is Negative. Fact Sheet for Patients: HairSlick.no Fact Sheet for Healthcare Providers: quierodirigir.com This test is not yet approved or cleared by the [place] FDA and  has been authorized for detection and/or diagnosis of SARS-CoV-2 by FDA under an Emergency Use Authorization (EUA). This EUA will remain  in effect (meaning this test can be used) for the duration of the COVID-19 declaration under Section 56 4(b)(1) of the Act, 21 U.S.C. section 360bbb-3(b)(1), unless the authorization is terminated or revoked sooner. Performed at [facility], [address], [place], [place] [address]   Culture, blood (Routine X 2) w Reflex to ID Panel     Status: None   Collection Time: 03/09/20  6:25 PM   Specimen: Right Antecubital; Blood  Result Value Ref Range  Status   Specimen Description RIGHT ANTECUBITAL  Final   Special Requests   Final    BOTTLES DRAWN AEROBIC AND ANAEROBIC Blood Culture adequate volume   Culture   Final    NO GROWTH 5 DAYS Performed at [facility], [address], [place], [place] [address]    Report Status 03/14/2020 FINAL  Final  Culture, blood (Routine X 2) w Reflex to ID Panel     Status: None   Collection Time: 03/09/20  6:25 PM   Specimen: BLOOD RIGHT HAND  Result Value Ref Range Status  Specimen Description BLOOD RIGHT HAND  Final   Special Requests   Final    BOTTLES DRAWN AEROBIC ONLY Blood Culture adequate volume   Culture   Final    NO GROWTH 5 DAYS Performed at [facility], [address] [place], [place] [address]    Report Status 03/14/2020 FINAL  Final  SARS CORONAVIRUS 2 (TAT 6-24 HRS) Nasopharyngeal Nasopharyngeal Swab     Status: None   Collection Time: 03/11/20  5:57 PM   Specimen: Nasopharyngeal Swab  Result Value Ref Range Status   SARS Coronavirus 2 NEGATIVE NEGATIVE Final    Comment: (NOTE) SARS-CoV-2 target nucleic acids are NOT DETECTED. The SARS-CoV-2 RNA is generally detectable in upper and lower respiratory specimens during the acute phase of infection. Negative results do not preclude SARS-CoV-2 infection, do not rule out co-infections with other pathogens, and should not be used as the sole basis for treatment or other patient management decisions. Negative results must be combined with clinical observations, patient history, and epidemiological information. The expected result is Negative. Fact Sheet for Patients: HairSlick.no Fact Sheet for Healthcare Providers: quierodirigir.com This test is not yet approved or cleared by the [place] FDA and  has been authorized for detection and/or diagnosis of SARS-CoV-2 by FDA under an Emergency Use Authorization (EUA). This EUA will remain  in effect (meaning this test can be  used) for the duration of the COVID-19 declaration under Section 56 4(b)(1) of the Act, 21 U.S.C. section 360bbb-3(b)(1), unless the authorization is terminated or revoked sooner. Performed at [facility], [address], [place], [place] [address]        Today   Subjective    Jesus Fowler today has no new complaints -Eating and drinking fair -Had BM          Patient has been seen and examined prior to discharge   Objective   Blood pressure 100/67, pulse 81, temperature 97.7 F (36.5 C), temperature source Oral, resp. rate 18, height  (1.676 m), weight 100.2 kg, SpO2 96 %.   Intake/Output Summary (Last 24 hours) at 03/14/2020 1212 Last data filed at 03/14/2020 1000 Gross per 24 hour  Intake 720 ml  Output 1500 ml  Net -780 ml    Exam Gen:- Awake Alert,  In no apparent distress  HEENT:- [place].AT, No sclera icterus Eye--much improved conjunctivae findings  Nose -2L/min Neck-Supple Neck,No JVD,.  Lungs-improving air movement, no wheezing  CV- S1, S2 normal, irregularly irregular  abd-  +ve B.Sounds, Abd Soft, No tenderness,    Extremity/Skin:-  pedal pulses present, left upper extremity without significant warmth, swelling or tenderness, bruising/ecchymosis persist no open draining wounds, chronic lower extremity edema, chronic severe venous stasis dermatitis type changes of both lower extremities Psych-affect is appropriate, oriented x3 Neuro-generalized weakness and deconditioning, no new focal deficits, no tremors   Data Review   CBC w Diff:  Lab Results  Component Value Date   WBC 13.8 (H) 03/13/2020   HGB 15.1 03/13/2020   HCT 49.4 03/13/2020   PLT 85 (L) 03/13/2020   LYMPHOPCT 4 03/08/2020   MONOPCT 7 03/08/2020   EOSPCT 0 03/08/2020   BASOPCT 0 03/08/2020    CMP:  Lab Results  Component Value Date   NA 140 03/13/2020   K 3.7 03/13/2020   CL 93 (L) 03/13/2020   CO2 40 (H) 03/13/2020   BUN 21 03/13/2020   CREATININE 0.87 03/13/2020     PROT 5.9 (L) 03/09/2020   ALBUMIN  2.8 (L) 03/09/2020   BILITOT 2.2 (H) 03/09/2020   ALKPHOS 72 03/09/2020   AST 30 03/09/2020   ALT 27 03/09/2020  .   Total Discharge time is about 33 minutes  Shon Hale M.D on 03/14/2020 at 12:12 PM  Go to [email or web address] -  for contact info  [facility] - Office  [phone]

## 2020-03-14 NOTE — Progress Notes (Signed)
Report called to [facility] RN at this time.

## 2020-03-14 NOTE — Discharge Instructions (Signed)
1)Discharge to SNF rehab on 2 L of oxygen via nasal cannula continuously 2)Please use CPAP nightly 3) CBC and BMP test every Thursday for 2 weeks 4)Patient is on Eliquis/Apixban so Avoid ibuprofen/Advil/Aleve/Motrin/Goody Powders/Naproxen/BC powders/Meloxicam/Diclofenac/Indomethacin and other Nonsteroidal anti-inflammatory medications as these will make you more likely to bleed and can cause stomach ulcers, can also cause Kidney problems.  5)Dysphagia 2 (Fine chop);Thin liquid, Pt benefits from liquid wash during PO consumption.

## 2020-03-14 NOTE — ED Provider Notes (Signed)
[facility] EMERGENCY DEPARTMENT Provider Note   CSN: [id number] Arrival date & time: 03/14/20  1450     History Chief Complaint  Patient presents with  . GI Bleeding    Jesus Fowler is a 86 y.o. male.  HPI   This patient is an 86 year old male, currently at the [facility] nursing facility, he was recently admitted to the hospital on March 08, 2020, he was discharged today.  He was admitted because of anasarca, he has been on Eliquis because of atrial fibrillation and was prescribed Levaquin due to a cellulitis of his left arm which had improved prior to discharge.  When the patient arrived at the nursing facility today the nurse practitioner went in to evaluate the patient and found him to be laying in a puddle of blood thought to be related to rectal bleeding.  The patient thinks that he lives at home with his wife, he states that prior to being admitted to the hospital he was able to ambulate but has been pretty swollen which has caused some deconditioning.  This bleeding is new, the patient is not aware of having a prior colonoscopy, my review of the medical record shows no prior gastroenterology evaluation in fact this patient did not been seen at our hospital until he was admitted in March 2021 in many many years.  The patient seems to be confused, level 5 caveat applies  Past Medical History:  Diagnosis Date  . A-fib (HCC)   . Cellulitis   . Hemorrhoid   . Hypertension   . Prostate disorder     Patient Active Problem List   Diagnosis Date Noted  . Acute respiratory failure with hypoxia (HCC) 03/14/2020  . OSA on CPAP 03/14/2020  . Obesity (BMI 30-39.9) 03/14/2020  . Cellulitis 03/14/2020  . Venous stasis dermatitis of both lower extremities 03/14/2020  . Constipated 03/14/2020  . Anticoagulated for Afib 03/14/2020  . Acute on chronic heart failure with preserved ejection fraction (HFpEF) /Diastolic Dysfunction 03/14/2020  . Swelling of lower extremity 03/08/2020    . Atrial fibrillation, chronic (HCC) 03/08/2020  . Swelling of both lower extremities 03/08/2020    History reviewed. No pertinent surgical history.     History reviewed. No pertinent family history.  Social History   Tobacco Use  . Smoking status: Former Games developer  . Smokeless tobacco: Never Used  Substance Use Topics  . Alcohol use: Never  . Drug use: Never    Home Medications Prior to Admission medications   Medication Sig Start Date End Date Taking? Authorizing Provider  acetaminophen (TYLENOL) 325 MG tablet Take 2 tablets (650 mg total) by mouth every 6 (six) hours as needed for mild pain, fever or headache (or Fever >/= 101). 03/14/20   Shon Hale, MD  amoxicillin-clavulanate (AUGMENTIN) 875-125 MG tablet Take 1 tablet by mouth 2 (two) times daily for 3 days. 03/14/20 03/17/20  Shon Hale, MD  ascorbic acid (VITAMIN C) 500 MG tablet Take 1,000 mg by mouth daily.    [provider]  Cholecalciferol (VITAMIN D3) 125 MCG (5000 UT) CAPS Take 15,000 Units by mouth daily.    [provider]  ELIQUIS 5 MG TABS tablet Take 2.5 mg by mouth 2 (two) times daily.  01/06/20   [provider]  erythromycin ophthalmic ointment Place into both eyes 3 (three) times daily for 3 days. 03/14/20 03/17/20  Shon Hale, MD  feeding supplement, ENSURE ENLIVE, (ENSURE ENLIVE) LIQD Take 237 mLs by mouth 2 (two) times daily between  meals. 03/14/20   Shon Hale, MD  guaiFENesin (MUCINEX) 600 MG 12 hr tablet Take 1 tablet (600 mg total) by mouth 2 (two) times daily. 03/14/20   Shon Hale, MD  HYDROcodone-acetaminophen (NORCO/VICODIN) 5-325 MG tablet Take 1 tablet by mouth every 6 (six) hours as needed for moderate pain. 03/14/20   Shon Hale, MD  hydrocortisone cream 1 % Apply 1 application topically 3 (three) times daily as needed for itching (minor skin irritation). 03/14/20   Shon Hale, MD  hydrOXYzine (ATARAX/VISTARIL) 25 MG tablet Take 1 tablet  (25 mg total) by mouth 3 (three) times daily as needed for anxiety. 03/14/20   Shon Hale, MD  LEVALBUTEROL TARTRATE HFA IN Inhale 2 puffs into the lungs every 6 (six) hours as needed.    [provider]  metoprolol succinate (TOPROL-XL) 25 MG 24 hr tablet Take 0.5 tablets (12.5 mg total) by mouth daily. 03/15/20   Shon Hale, MD  NON FORMULARY May use CPAP from home with home settings. At Bedtime    [provider]  NON FORMULARY Dysphagia 2 thin liquids    [provider]  Omega-3 Fatty Acids (FISH OIL BURP-LESS PO) Take 2 capsules by mouth daily.    [provider]  OXYGEN Inhale 2 L into the lungs continuous.    [provider]  polyethylene glycol (MIRALAX) 17 g packet Take 17 g by mouth daily. 03/14/20   Shon Hale, MD  potassium chloride (KLOR-CON) 10 MEQ tablet Take 10 mEq by mouth daily. 03/07/20   [provider]  Probiotic Product (PROBIOTIC DAILY PO) Take 1 tablet by mouth daily.    [provider]  rosuvastatin (CRESTOR) 10 MG tablet Take 10 mg by mouth in the morning.  02/07/20   [provider]  senna-docusate (SENOKOT-S) 8.6-50 MG tablet Take 2 tablets by mouth at bedtime. 03/14/20 03/14/21  Shon Hale, MD  sodium chloride (OCEAN) 0.65 % SOLN nasal spray Place 2 sprays into both nostrils 3 (three) times daily. 03/14/20   Shon Hale, MD  tamsulosin (FLOMAX) 0.4 MG CAPS capsule Take 0.4 mg by mouth daily. 02/07/20   [provider]  torsemide (DEMADEX) 20 MG tablet Take 1 tablet (20 mg total) by mouth daily. 03/14/20   Shon Hale, MD  VITAMIN E PO Take 2 tablets by mouth daily.    [provider]    Allergies    Patient has no known allergies.  Review of Systems   Review of Systems  Unable to perform ROS: Mental status change    Physical Exam Updated Vital Signs BP 111/69   Pulse 99   Temp 98.6 F (37 C) (Oral)   Resp 20   Ht 1.676 m (5\' 6" )   Wt 100.2 kg    SpO2 95%   BMI 35.65 kg/m   Physical Exam Vitals and nursing note reviewed.  Constitutional:      General: He is not in acute distress.    Appearance: He is well-developed.  HENT:     Head: Normocephalic and atraumatic.     Mouth/Throat:     Pharynx: No oropharyngeal exudate.  Eyes:     General: No scleral icterus.       Right eye: No discharge.        Left eye: No discharge.     Conjunctiva/sclera: Conjunctivae normal.     Pupils: Pupils are equal, round, and reactive to light.  Neck:     Thyroid: No thyromegaly.     Vascular:  No JVD.  Cardiovascular:     Rate and Rhythm: Normal rate. Rhythm irregular.     Heart sounds: Normal heart sounds. No murmur. No friction rub. No gallop.   Pulmonary:     Effort: Pulmonary effort is normal. No respiratory distress.     Breath sounds: Normal breath sounds. No wheezing or rales.  Abdominal:     General: Bowel sounds are normal. There is no distension.     Palpations: Abdomen is soft. There is no mass.     Tenderness: There is no abdominal tenderness.  Genitourinary:    Comments: Significant amount of bright and dark red blood on the sheets and in the rectal vault with formed stool present as well. Musculoskeletal:        General: No tenderness. Normal range of motion.     Cervical back: Normal range of motion and neck supple.     Right lower leg: Edema present.     Left lower leg: Edema present.     Comments: Edema present to the bilateral legs with anasarca extending onto the abdomen  Lymphadenopathy:     Cervical: No cervical adenopathy.  Skin:    General: Skin is warm and dry.     Findings: No erythema or rash.     Comments: Bruising present to the left arm, there is still some redness and warmth of the left arm, bilateral stasis dermatitis of the legs  Neurological:     Mental Status: He is alert.     Coordination: Coordination normal.     Comments: Severely weak, bedbound, he is able to move all 4 extremities but very  symmetrically weak  Psychiatric:        Behavior: Behavior normal.     ED Results / Procedures / Treatments   Labs (all labs ordered are listed, but only abnormal results are displayed) Labs Reviewed  CBC WITH DIFFERENTIAL/PLATELET - Abnormal; Notable for the following components:      Result Value   WBC 19.4 (*)    HCT 52.7 (*)    MCV 101.3 (*)    RDW 19.3 (*)    Platelets 87 (*)    Neutro Abs 16.4 (*)    Monocytes Absolute 1.8 (*)    Abs Immature Granulocytes 0.12 (*)    All other components within normal limits  COMPREHENSIVE METABOLIC PANEL - Abnormal; Notable for the following components:   Chloride 94 (*)    CO2 39 (*)    Glucose, Bld 114 (*)    Calcium 8.3 (*)    Total Protein 5.7 (*)    Albumin 2.7 (*)    Total Bilirubin 2.8 (*)    All other components within normal limits  PROTIME-INR - Abnormal; Notable for the following components:   Prothrombin Time 18.5 (*)    INR 1.6 (*)    All other components within normal limits  TYPE AND SCREEN    EKG None  Radiology CT ABDOMEN PELVIS W CONTRAST  Result Date: 03/14/2020 CLINICAL DATA:  Lower gastrointestinal bleed. EXAM: CT ABDOMEN AND PELVIS WITH CONTRAST TECHNIQUE: Multidetector CT imaging of the abdomen and pelvis was performed using the standard protocol following bolus administration of intravenous contrast. CONTRAST:  148mL OMNIPAQUE IOHEXOL 300 MG/ML  SOLN COMPARISON:  None. FINDINGS: Lower chest: Small pleural effusions bilaterally, larger on the right. Subpleural compressive atelectasis in both lungs. Right hemidiaphragm elevation. Mild cardiomegaly. Four-vessel moderate to severe coronary calcification most pronounced along the LAD. Pulmonary artery hypertension, the pulmonary trunk measuring  4.6 cm diameter. Tiny paraesophageal hernia. Hepatobiliary: Serpiginous lobular enhancement in the medial right hepatic dome measuring up to 24 mm diameter and isoenhancing with the hepatic vasculature, likely  intraparenchymal varices. Fatty infiltration of the hepatic parenchyma. Normal gallbladder and biliary tree. Pancreas: Mild fatty infiltration. No acute pancreatitis. No apparent dilatation of the main pancreatic duct. Spleen: A 12 mm ovoid enhancement within the splenic parenchyma. Adrenals/Urinary Tract: Normal bilateral adrenal glands. Simple appearing bilateral renal cortical cyst measuring up to 2.5 cm on the left and 17 Hounsfield units. No right or left hydronephrosis. Normal bilateral ureters. Symmetrical mild bilateral perinephric inflammatory change, likely sequela of medical renal disease. A nonobstructing 4 mm left renal lower pole calculus. No apparent urinary bladder abnormality. Stomach/Bowel: Large stool burden without bowel obstruction. Normal appendix, coronal series 5, image 32. Diffuse mild circumferential rectal wall thickening with inflammatory change in the perirectal fat planes and small amount of 5 Hounsfield unit free fluid in the pre sacral space. Apparent short segment mild mucosal thickening of a left lower abdominal quadrant jejunal bowel loop, coronal series 5, image 31, and apparent diffuse gastric mucosal thickening which could be due entirely to nondistention by enteric contrast. No apparent colonic wall thickening. Mild sigmoid diverticulosis coli. No apparent colonic wall thickening. Redundant sigmoid colon with adhesion formation to the right upper and lower abdominal walls, but no volvulus. Reflux of stool into the terminal ileum. No convincing evidence for contrast extravasation into the colonic bowel lumen. Vascular/Lymphatic: Aorto bi-iliac calcified atherosclerosis without an abdominal aorta aneurysm. Nonspecific bilateral inguinal, retroperitoneal and mesenteric lymph nodes. Reproductive: Scoliosis and moderate skeletal degenerative changes. Other: Small periumbilical and bilateral inguinal herniations of fat without strangulation or incarceration. Extensive anasarca above  and below the waist. Musculoskeletal: Scoliosis and moderate skeletal degenerative changes. Mild diffuse bone demineralization. Grade 1 anterolisthesis of L5 on S1, likely degenerative. IMPRESSION: An acute mild uncomplicated proctitis with small amount of free fluid in the presacral space and edema in the perirectal fat plane without perforation or abscess. Large stool burden throughout the colon. Sigmoid diverticulosis without acute diverticulitis or contrast extravasation within the colonic lumen. Apparent mild mucosal thickening of the gastric lumen and a left lower abdominal quadrant small bowel loop, which could be secondary to incomplete distension or a mild gastroenteritis. Abnormal enhancement in the medial right hepatic dome and in the splenic parenchyma, possibly intrahepatic varices and splenic hemangioma, new or not apparent on the March 08, 2020 and April 18, 2004 CT chest studies. Mild cardiomegaly with four-vessel moderate to severe coronary calcifications, small bilateral pleural effusions and bibasilar compressive atelectasis. Extensive anasarca. Nonobstructing small left renal calculus. Aortic calcified atherosclerosis. Pulmonary artery hypertension. Electronically Signed   By: Laurence Ferrari   On: 03/14/2020 18:17   DG CHEST PORT 1 VIEW  Result Date: 03/13/2020 CLINICAL DATA:  Dyspnea EXAM: PORTABLE CHEST 1 VIEW COMPARISON:  03/08/2020 chest radiograph. FINDINGS: Low lung volumes. Stable cardiomediastinal silhouette with top-normal heart size. No pneumothorax. Stable elevation of the right hemidiaphragm. Blunting of the right costophrenic angle. No left pleural effusion. No overt pulmonary edema. Right basilar hazy opacity. IMPRESSION: Low lung volumes. Stable elevation of the right hemidiaphragm. Hazy right basilar lung opacity, which could represent atelectasis, aspiration and/or pneumonia. New blunting of the right costophrenic angle, cannot exclude small right pleural effusion.  Electronically Signed   By: Delbert Phenix M.D.   On: 03/13/2020 13:47    Procedures Procedures (including critical care time)  Medications Ordered in ED Medications  pantoprazole (PROTONIX)  injection 40 mg (40 mg Intravenous Given 03/14/20 1621)  iohexol (OMNIPAQUE) 300 MG/ML solution 100 mL (100 mLs Intravenous Contrast Given 03/14/20 1731)    ED Course  I have reviewed the triage vital signs and the nursing notes.  Pertinent labs & imaging results that were available during my care of the patient were reviewed by me and considered in my medical decision making (see chart for details).    MDM Rules/Calculators/A&P                      This patient appears to have had some gastrointestinal bleeding, it is not clear exactly what is going on with that and he will need to have digital exam, CBC and labs, he will likely need to be admitted back to the hospital as he was recently admitted and now is having gastrointestinal bleeding on Eliquis.  This patient is severely deconditioned.  Rectal exam shows plenty of blood, this patient will need to be admitted, I will discuss with gastroenterology, hold anticoagulation for A. Fib  Protonix has been ordered  Discussed the care with the gastroenterologist, Dr. Jena Gaussourk who will see the patient in the morning, agrees with CT scan and repeat CBC  CT scan shows that the patient has some proctitis, I have evaluated the patient as well as his hemoglobin, there is no signs of severe anemia, I have discussed this with the admitting doctor, Dr. Debby BudNorins who will admit.  Jesus Fowler was evaluated in Emergency Department on 03/14/2020 for the symptoms described in the history of present illness. He was evaluated in the context of the global COVID-19 pandemic, which necessitated consideration that the patient might be at risk for infection with the SARS-CoV-2 virus that causes COVID-19. Institutional protocols and algorithms that pertain to the evaluation of  patients at risk for COVID-19 are in a state of rapid change based on information released by regulatory bodies including the CDC and federal and state organizations. These policies and algorithms were followed during the patient's care in the ED.   Final Clinical Impression(s) / ED Diagnoses Final diagnoses:  Rectal bleeding  Proctitis    Rx / DC Orders ED Discharge Orders    None       Eber HongMiller, Teagan Ozawa, MD 03/14/20 [phone]

## 2020-03-14 NOTE — Progress Notes (Signed)
Location:    [facility] Room Number: 160/P Place of Service:  SNF (31)   CODE STATUS: Full Code  No Known Allergies  Chief Complaint  Patient presents with   Hospitalization Follow-up    Hospitalization Follow Up    HPI:  He is a 86 year old man who has been hospitalized from 03-08-20 through 03-14-20. He was treated for generalized weakness and physical deconditioning; acute respiratory failure; acute on chronic congestive heart failure. He is here for short term rehab. Staff reports that he has active rectal bleeding. He denies any abdominal pain no nausea; no vomiting.   Past Medical History:  Diagnosis Date   A-fib [facility])    Cellulitis    Hemorrhoid    Hypertension    Prostate disorder     No past surgical history on file.  Social History   Socioeconomic History   Marital status: Married    Spouse name: Not on file   Number of children: Not on file   Years of education: Not on file   Highest education level: Not on file  Occupational History   Not on file  Tobacco Use   Smoking status: Former Smoker   Smokeless tobacco: Never Used  Substance and Sexual Activity   Alcohol use: Never   Drug use: Never   Sexual activity: Not on file  Other Topics Concern   Not on file  Social History Narrative   Not on file   Social Determinants of Health   Financial Resource Strain:    Difficulty of Paying Living Expenses:   Food Insecurity:    Worried About Programme researcher, broadcasting/film/video in the Last Year:    Barista in the Last Year:   Transportation Needs:    Freight forwarder (Medical):    Lack of Transportation (Non-Medical):   Physical Activity:    Days of Exercise per Week:    Minutes of Exercise per Session:   Stress:    Feeling of Stress :   Social Connections:    Frequency of Communication with Friends and Family:    Frequency of Social Gatherings with Friends and Family:    Attends Religious Services:     Active Member of Clubs or Organizations:    Attends Engineer, structural:    Marital Status:   Intimate Partner Violence:    Fear of Current or Ex-Partner:    Emotionally Abused:    Physically Abused:    Sexually Abused:    No family history on file.    VITAL SIGNS BP 111/69    Pulse 99    Temp 98.6 F (37 C) (Oral)    Resp 20    Ht 5\' 6"  (1.676 m)    Wt 220 lb 14.4 oz (100.2 kg)    BMI 35.65 kg/m   Outpatient Encounter Medications as of 03/14/2020  Medication Sig   acetaminophen (TYLENOL) 325 MG tablet Take 2 tablets (650 mg total) by mouth every 6 (six) hours as needed for mild pain, fever or headache (or Fever >/= 101).   amoxicillin-clavulanate (AUGMENTIN) 875-125 MG tablet Take 1 tablet by mouth 2 (two) times daily for 3 days.   ascorbic acid (VITAMIN C) 500 MG tablet Take 1,000 mg by mouth daily.   Cholecalciferol (VITAMIN D3) 125 MCG (5000 UT) CAPS Take 15,000 Units by mouth daily.   ELIQUIS 5 MG TABS tablet Take 2.5 mg by mouth 2 (two) times daily.    erythromycin  ophthalmic ointment Place into both eyes 3 (three) times daily for 3 days.   feeding supplement, ENSURE ENLIVE, (ENSURE ENLIVE) LIQD Take 237 mLs by mouth 2 (two) times daily between meals.   guaiFENesin (MUCINEX) 600 MG 12 hr tablet Take 1 tablet (600 mg total) by mouth 2 (two) times daily.   HYDROcodone-acetaminophen (NORCO/VICODIN) 5-325 MG tablet Take 1 tablet by mouth every 6 (six) hours as needed for moderate pain.   hydrocortisone cream 1 % Apply 1 application topically 3 (three) times daily as needed for itching (minor skin irritation).   hydrOXYzine (ATARAX/VISTARIL) 25 MG tablet Take 1 tablet (25 mg total) by mouth 3 (three) times daily as needed for anxiety.   LEVALBUTEROL TARTRATE HFA IN Inhale 2 puffs into the lungs every 6 (six) hours as needed.   [START ON 03/15/2020] metoprolol succinate (TOPROL-XL) 25 MG 24 hr tablet Take 0.5 tablets (12.5 mg total) by mouth daily.    NON FORMULARY May use CPAP from home with home settings. At Bedtime   NON FORMULARY Dysphagia 2 thin liquids   Omega-3 Fatty Acids (FISH OIL BURP-LESS PO) Take 2 capsules by mouth daily.   OXYGEN Inhale 2 L into the lungs continuous.   polyethylene glycol (MIRALAX) 17 g packet Take 17 g by mouth daily.   potassium chloride (KLOR-CON) 10 MEQ tablet Take 10 mEq by mouth daily.   Probiotic Product (PROBIOTIC DAILY PO) Take 1 tablet by mouth daily.   rosuvastatin (CRESTOR) 10 MG tablet Take 10 mg by mouth in the morning.    senna-docusate (SENOKOT-S) 8.6-50 MG tablet Take 2 tablets by mouth at bedtime.   sodium chloride (OCEAN) 0.65 % SOLN nasal spray Place 2 sprays into both nostrils 3 (three) times daily.   tamsulosin (FLOMAX) 0.4 MG CAPS capsule Take 0.4 mg by mouth daily.   torsemide (DEMADEX) 20 MG tablet Take 1 tablet (20 mg total) by mouth daily.   VITAMIN E PO Take 2 tablets by mouth daily.   No facility-administered encounter medications on file as of 03/14/2020.     SIGNIFICANT DIAGNOSTIC EXAMS  TODAY  03-08-20: chest x-ray: Stable significant elevation of the right hemidiaphragm. No acute finding.  03-08-20: ct angio of chest:  No evidence of pulmonary emboli. Small right pleural effusion with right basilar atelectasis. 5 mm nodule in the left upper lobe increased in size from 16 years ago at which time it measured 1-2 mm. No follow-up needed if patient is low-risk. Non-contrast chest CT can be considered in 12 months if patient is high-risk.  03-09-20: 2-d echo:  Left ventricular ejection fraction, by estimation, is 60 to 65%. The  left ventricle has normal function. The left ventricle has no regional  wall motion abnormalities. There is mild concentric left ventricular  hypertrophy. Left ventricular diastolic  parameters were normal. There is the interventricular septum is flattened  in systole, consistent with right ventricular pressure overload.   03-09-20: left  upper extremity doppler: No evidence of DVT within the left upper extremity.   03-13-20: chest x-ray:  Low lung volumes. Stable elevation of the right hemidiaphragm. Hazy right basilar lung opacity, which could represent atelectasis, aspiration and/or pneumonia. New blunting of the right costophrenic angle, cannot exclude small right pleural effusion.  LABS REVIEWED TODAY;   03-08-20: wbc 18.9 hgb 16.7; hct 51.1; mcv 98.1 plt 143; glucose 100; bun 29; creat 1.28; k= 3.7; na++ 140; ca 9.4; total bili 2.9; albumin 3.2; tsh 0.492; mag 2.5 03-09-20: blood cultures: no growth 03-10-20: wbc  17.0; hgb 16.4; hct 52.2; mcv 100.4 plt 108; glucose 112; bun 29; creat 1.25; k+ 3.7; an++ 140; ca 8.9  03-13-20: wbc 13.8; hgb 15.1; hct 49.4 mcv 103.1 plt 85; glucose 94; bun 21; creat 0.87; k+ 3.7; na++ 140; ca 8.1    Review of Systems  Constitutional: Negative for malaise/fatigue.  Respiratory: Negative for cough and shortness of breath.   Cardiovascular: Negative for chest pain, palpitations and leg swelling.  Gastrointestinal: Positive for blood in stool. Negative for abdominal pain, constipation and heartburn.  Musculoskeletal: Negative for back pain, joint pain and myalgias.  Skin: Negative.   Neurological: Negative for dizziness.  Psychiatric/Behavioral: The patient is not nervous/anxious.     Physical Exam Constitutional:      General: He is not in acute distress.    Appearance: He is well-developed. He is morbidly obese. He is not diaphoretic.  Neck:     Thyroid: No thyromegaly.  Cardiovascular:     Rate and Rhythm: Normal rate. Rhythm irregular.     Pulses: Normal pulses.     Heart sounds: Normal heart sounds.  Pulmonary:     Effort: Pulmonary effort is normal. No respiratory distress.     Breath sounds: Normal breath sounds.     Comments: 02 dependent  Abdominal:     General: Bowel sounds are normal. There is no distension.     Palpations: Abdomen is soft.     Tenderness: There is no  abdominal tenderness.     Comments: Maroon rectal bleeding present   Musculoskeletal:     Cervical back: Neck supple.     Right lower leg: Edema present.     Left lower leg: Edema present.     Comments: Anasarca present  is able to move all extremities   Lymphadenopathy:     Cervical: No cervical adenopathy.  Skin:    General: Skin is warm and dry.     Comments: Left upper extremity cellulitic   Neurological:     Mental Status: He is alert and oriented to person, place, and time.  Psychiatric:        Mood and Affect: Mood normal.        ASSESSMENT/ PLAN:  TODAY;   1. Acute on chronic congestive heart failure with preserved ejection fraction/diastolic dysfunction: is stable (EF 60-56% 03-09-20) will continue demadex 20 mg daily with k + 10 meq daily   2. Chronic atrial fibrillation: heart rate is stable will continue toprol xl 12.5 mg daily for rate control with eliquis 2.5 mg twice daily   3. OSA with CPAP will continue CPAP at night  4. Acute respiratory failure with hypoxia: is stable is 02 dependent; will continue xopenex 2 puff every 6 hours as needed mucinex 600 mg twice daily   5. Cellulitis left upper extremity: is stable will complete augmentin 875 mg twice daily for 3 days  6. Dyslipidemia; is stable will continue crestor 10 mg daily and fish oil 2 gm daily   7. Chronic constipation: will continue senna s 2 tabs daily and miralax daily   8. Benign prostatic hypertrophy with urinary urgency: is stable will continue flomax 0.4 mg daily   9. Rectal bleeding: will send to the ED for further evaluation and treatment.   10. Thrombocytopenia: plt 85.        MD is aware of resident's narcotic use and is in agreement with current plan of care. We will attempt to wean resident as appropriate.  Ok Edwards NP [facility]  Contact [phone] Monday through Friday 8am- 5pm  After hours call [phone]

## 2020-03-15 ENCOUNTER — Encounter: Payer: Self-pay | Admitting: Adult Health

## 2020-03-15 ENCOUNTER — Encounter (HOSPITAL_COMMUNITY): Payer: Self-pay | Admitting: Internal Medicine

## 2020-03-15 DIAGNOSIS — K6289 Other specified diseases of anus and rectum: Secondary | ICD-10-CM

## 2020-03-15 DIAGNOSIS — R1310 Dysphagia, unspecified: Secondary | ICD-10-CM

## 2020-03-15 DIAGNOSIS — I5033 Acute on chronic diastolic (congestive) heart failure: Secondary | ICD-10-CM

## 2020-03-15 DIAGNOSIS — R6881 Early satiety: Secondary | ICD-10-CM

## 2020-03-15 DIAGNOSIS — K625 Hemorrhage of anus and rectum: Secondary | ICD-10-CM

## 2020-03-15 DIAGNOSIS — K5900 Constipation, unspecified: Secondary | ICD-10-CM

## 2020-03-15 LAB — CBC
HCT: 51.3 % (ref 39.0–52.0)
Hemoglobin: 16.2 g/dL (ref 13.0–17.0)
MCH: 32.1 pg (ref 26.0–34.0)
MCHC: 31.6 g/dL (ref 30.0–36.0)
MCV: 101.8 fL — ABNORMAL HIGH (ref 80.0–100.0)
Platelets: 77 10*3/uL — ABNORMAL LOW (ref 150–400)
RBC: 5.04 MIL/uL (ref 4.22–5.81)
RDW: 19.5 % — ABNORMAL HIGH (ref 11.5–15.5)
WBC: 14.9 10*3/uL — ABNORMAL HIGH (ref 4.0–10.5)
nRBC: 0 % (ref 0.0–0.2)

## 2020-03-15 MED ORDER — POLYETHYLENE GLYCOL 3350 17 G PO PACK: 17.0000 g | PACK | Freq: Every day | ORAL | Status: DC | PRN

## 2020-03-15 MED ORDER — CHLORHEXIDINE GLUCONATE 0.12 % MT SOLN
15.0000 mL | Freq: Two times a day (BID) | OROMUCOSAL | Status: DC
  Administered 2020-03-15 – 2020-03-18 (×7): 15 mL via OROMUCOSAL
  Filled 2020-03-15 (×6): qty 15

## 2020-03-15 MED ORDER — LINACLOTIDE 145 MCG PO CAPS
145.0000 ug | ORAL_CAPSULE | Freq: Every day | ORAL | Status: DC
  Administered 2020-03-16 – 2020-03-18 (×3): 145 ug via ORAL
  Filled 2020-03-15 (×3): qty 1

## 2020-03-15 MED ORDER — POLYETHYLENE GLYCOL 3350 17 G PO PACK
17.0000 g | PACK | Freq: Two times a day (BID) | ORAL | Status: AC
  Administered 2020-03-15 (×2): 17 g via ORAL
  Filled 2020-03-15 (×2): qty 1

## 2020-03-15 MED ORDER — ORAL CARE MOUTH RINSE
15.0000 mL | Freq: Two times a day (BID) | OROMUCOSAL | Status: DC
  Administered 2020-03-15 – 2020-03-18 (×5): 15 mL via OROMUCOSAL

## 2020-03-15 NOTE — Plan of Care (Signed)
°  Problem: Education: °Goal: Knowledge of General Education information will improve °Description: Including pain rating scale, medication(s)/side effects and non-pharmacologic comfort measures °Outcome: Progressing °  °Problem: Nutrition: °Goal: Adequate nutrition will be maintained °Outcome: Progressing °  °Problem: Elimination: °Goal: Will not experience complications related to bowel motility °Outcome: Progressing °  °Problem: Pain Managment: °Goal: General experience of comfort will improve °Outcome: Progressing °  °Problem: Safety: °Goal: Ability to remain free from injury will improve °Outcome: Progressing °  °Problem: Skin Integrity: °Goal: Risk for impaired skin integrity will decrease °Outcome: Progressing °  °

## 2020-03-15 NOTE — Consult Note (Signed)
WOC Nurse Consult Note: Consult completed remotely after review of record, including images.  Patient receiving care in [facility]. Reason for Consult: "leg wounds, see photos in Epic" Wound type: healing partial thickness wounds on right posterior heel; pink wound bed. See photo. The left knee has a pink, partial thickness wound.  No other wounds detected in photos, only dry, thickened skin with hemosiderin staining.  I am ordering Sween moisturizing ointment for these areas, and Xeroform gauze and kerlex if any other wounds are present. Pressure Injury POA: Yes Measurement: To be provided by the bedside RN in the flowsheet section Wound bed: Drainage (amount, consistency, odor)  Periwound: Dressing procedure/placement/frequency:  For right heel:  Place a foam dressing over the right heel, then place the foot in a Prevalon heel lift boot.  Change the foam every 3 days and prn.  For the left knee: Place a xeroform gauze [place] [phone]) over the left knee wound. Secure with kerlex. Change daily.  Apply Sween moisturizing ointment to BLE after washing with soap and water.  If other wounds are present, apply Xeroform and kerlex, change daily.  Monitor the wound area(s) for worsening of condition such as: Signs/symptoms of infection,  Increase in size,  Development of or worsening of odor, Development of pain, or increased pain at the affected locations.  Notify the medical team if any of these develop.  Thank you for the consult. WOC nurse will not follow at this time.  Please re-consult the WOC team if needed.  Helmut Muster, RN, MSN, CWOCN, CNS-BC, pager [phone]

## 2020-03-15 NOTE — Progress Notes (Signed)
Patient Demographics:    Jesus Fowler, is a 86 y.o. male, DOB - 12/15/1933, ZOX:[id number]  Admit date - 03/14/2020   Admitting Physician Jacques Navy, MD  Outpatient Primary MD for the patient is Jesus Linsey, MD  LOS - 1   Chief Complaint  Patient presents with  . GI Bleeding        Subjective:    Jesus Fowler today has no fevers, no emesis,  No chest pain,   -Patient son and wife at bedside questions answered, no abdominal pain, no emesis  Assessment  & Plan :    Active Problems:   Atrial fibrillation, chronic (HCC)   Acute on chronic heart failure with preserved ejection fraction (HFpEF) /Diastolic Dysfunction   Swelling of both lower extremities   OSA on CPAP   Hematochezia   Proctitis   Rectal bleeding   Dysphagia   Early satiety   Brief Summary -86 y.o.malewith medical history significant foratrial fibrillation, hypertension readmitted on 03/14/2020 after being discharged same date to SNF rehab with episode of rectal bleeding -Rectal bleeding this is a new finding and new diagnosis for patient (was not present during recent hospitalization)   A/p  1) rectal bleeding/painless hematochezia --- GI input appreciated H&H is stable, -CT abdomen and pelvis suggest diverticulosis of the sigmoid colon -Plan is for flexible sigmoidoscopy possibly on 03/17/2020, last dose of Eliquis was 03/14/2020 - 2)Acute Hypoxic Respiratory Failure--- suspect acute on chronic HFpEF/CHF related, BNP is 280,  --Patient is a reformed smoker so cannot exclude some component of COPD -CTA chest w/o acute PE, small right pleural effusion and right-sided atelectasis noted --... Currently requiring 2 L of oxygen via nasal cannula -PTA patient was not on home O2 -Echo with EF of 60 to 65% -Recently aggressively diuresed with IV Lasix --weight is down to 218pounds from 236.99 -Fluid balance is  negative -Continue p.o. torsemide -Continue CPAP with sleep  3)Left upper extremity cellulitis----WBC is down to14.29from 21.1, -- PTA patient was treated with Levaquin as outpatient, no fevers, treated with IV Rocephin, Blood Cx NGTD -Left upper extremity venous Dopplers without acute DVT Okay to complete p.o. Augmentin    4) chronic Atrial fibrillation- rates 80s to 90s. Rate controlled and anticoagulated. Last dose of Eliquis was 03/14/2020  -Continue to hold Eliquis due to GI bleed We already decreased Toprol-XL to 12.5 mg daily from 50 mg daily due to soft BP for rate control  5)Prolonged QTC-457 --- Keep potassium close to 4 magnesium above 2  6)Elevated bilirubin-T bil 2.7 to 2.9 (indirect 1.9, direct 0.8), --LFTs WNL -Asymptomatic at this time outpatient follow-up advised.  7)Hypertension--- BP soft despite holding lisinopril/HCTZ and amlodipine, decreased Toprol-XL to 12.5 mg daily from 50 mg daily  8)BPH--- continue Flomax  9)Dysphagia-- Speech eval appreciated, recommends---Dysphagia 2 (Fine chop);Thin liquid, Pt benefits from liquid wash during PO consumption. -Plan is for EGD with possible dilatation on 03/17/2020 during flexible sigmoidoscopy procedure  10)Generalized Weakness and Deconditioning-TSH 0.4, -anticipate discharge back to SNF  Disposition--anticipate discharge to SNF for rehab after further endoluminal evaluation with flexible sigmoidoscopy and EGD on 03/17/2020 after Eliquis "washout" .  Given GI bleed and swallowing concerns   Code Status:Full  Family Communication: (patient is alert, awake and coherent) Discussed  withson who is visiting from [place] and wife at bedside  Consults :GI DVT Prophylaxis: - SCDs-=-GI bleed   Lab Results  Component Value Date   PLT 77 (L) 03/15/2020    Inpatient Medications  Scheduled Meds: . amoxicillin-clavulanate  1 tablet Oral BID  . chlorhexidine  15 mL Mouth Rinse BID  .  erythromycin   Both Eyes TID  . feeding supplement (ENSURE ENLIVE)  237 mL Oral BID BM  . [START ON 03/16/2020] linaclotide  145 mcg Oral QAC breakfast  . mouth rinse  15 mL Mouth Rinse q12n4p  . metoprolol succinate  12.5 mg Oral Daily  . omega-3 acid ethyl esters  2 g Oral Daily  . polyethylene glycol  17 g Oral BID  . potassium chloride  10 mEq Oral Daily  . rosuvastatin  10 mg Oral q1800  . sodium chloride  2 spray Each Nare TID  . tamsulosin  0.4 mg Oral Daily  . torsemide  20 mg Oral Daily  . vitamin E  800 Units Oral Daily   Continuous Infusions: . sodium chloride     PRN Meds:.acetaminophen, albuterol, hydrOXYzine, [START ON 03/16/2020] polyethylene glycol    Anti-infectives (From admission, onward)   Start     Dose/Rate Route Frequency Ordered Stop   03/14/20 2200  amoxicillin-clavulanate (AUGMENTIN) 875-125 MG per tablet 1 tablet     1 tablet Oral 2 times daily 03/14/20 2143          Objective:   Vitals:   03/15/20 0432 03/15/20 0832 03/15/20 1309 03/15/20 1328  BP: 112/74 119/71 109/75   Pulse: 80 97 87   Resp: 16 16 18    Temp: 97.7 F (36.5 C) (!) 97.5 F (36.4 C) 98 F (36.7 C)   TempSrc: Oral Oral Oral   SpO2: 96% 95% 96% 97%  Weight: 99.1 kg     Height: 5\' 6"  (1.676 m)       Wt Readings from Last 3 Encounters:  03/15/20 99.1 kg  03/14/20 100.2 kg  03/14/20 100.2 kg     Intake/Output Summary (Last 24 hours) at 03/15/2020 1920 Last data filed at 03/15/2020 1700 Gross per 24 hour  Intake 600 ml  Output 601 ml  Net -1 ml     Physical Exam Gen:- Awake Alert, In no apparent distress  HEENT:- [place].AT, No sclera icterus Eye--much improved conjunctivae findings  Nose -2L/min Neck-Supple Neck,No JVD,.  Lungs-improving air movement, no wheezing  CV- S1, S2 normal, irregularly irregular  abd- +ve B.Sounds, Abd Soft, No tenderness,  Extremity/Skin:- pedal pulses present, left upper extremity without significant warmth, swelling or tenderness,  bruising/ecchymosis persistno open draining wounds, chronic lower extremity edema, chronic severe venous stasis dermatitis type changes of both lower extremities Psych-affect is appropriate, oriented x3 Neuro-generalized weakness and deconditioning, no new focal deficits, no tremors   Data Review:   Micro Results Recent Results (from the past 240 hour(s))  SARS CORONAVIRUS 2 (TAT 6-24 HRS) Nasopharyngeal Nasopharyngeal Swab     Status: None   Collection Time: 03/08/20 11:30 PM   Specimen: Nasopharyngeal Swab  Result Value Ref Range Status   SARS Coronavirus 2 NEGATIVE NEGATIVE Final    Comment: (NOTE) SARS-CoV-2 target nucleic acids are NOT DETECTED. The SARS-CoV-2 RNA is generally detectable in upper and lower respiratory specimens during the acute phase of infection. Negative results do not preclude SARS-CoV-2 infection, do not rule out co-infections with other pathogens, and should not be used as the sole basis for treatment or other patient  management decisions. Negative results must be combined with clinical observations, patient history, and epidemiological information. The expected result is Negative. Fact Sheet for Patients: HairSlick.no Fact Sheet for Healthcare Providers: quierodirigir.com This test is not yet approved or cleared by the [place] FDA and  has been authorized for detection and/or diagnosis of SARS-CoV-2 by FDA under an Emergency Use Authorization (EUA). This EUA will remain  in effect (meaning this test can be used) for the duration of the COVID-19 declaration under Section 56 4(b)(1) of the Act, 21 U.S.C. section 360bbb-3(b)(1), unless the authorization is terminated or revoked sooner. Performed at [facility], [address], [place], [place] [address]   Culture, blood (Routine X 2) w Reflex to ID Panel     Status: None   Collection Time: 03/09/20  6:25 PM   Specimen: Right Antecubital;  Blood  Result Value Ref Range Status   Specimen Description RIGHT ANTECUBITAL  Final   Special Requests   Final    BOTTLES DRAWN AEROBIC AND ANAEROBIC Blood Culture adequate volume   Culture   Final    NO GROWTH 5 DAYS Performed at [facility], [address], [place], [place] [address]    Report Status 03/14/2020 FINAL  Final  Culture, blood (Routine X 2) w Reflex to ID Panel     Status: None   Collection Time: 03/09/20  6:25 PM   Specimen: BLOOD RIGHT HAND  Result Value Ref Range Status   Specimen Description BLOOD RIGHT HAND  Final   Special Requests   Final    BOTTLES DRAWN AEROBIC ONLY Blood Culture adequate volume   Culture   Final    NO GROWTH 5 DAYS Performed at [facility], [address], [place], [place] [address]    Report Status 03/14/2020 FINAL  Final  SARS CORONAVIRUS 2 (TAT 6-24 HRS) Nasopharyngeal Nasopharyngeal Swab     Status: None   Collection Time: 03/11/20  5:57 PM   Specimen: Nasopharyngeal Swab  Result Value Ref Range Status   SARS Coronavirus 2 NEGATIVE NEGATIVE Final    Comment: (NOTE) SARS-CoV-2 target nucleic acids are NOT DETECTED. The SARS-CoV-2 RNA is generally detectable in upper and lower respiratory specimens during the acute phase of infection. Negative results do not preclude SARS-CoV-2 infection, do not rule out co-infections with other pathogens, and should not be used as the sole basis for treatment or other patient management decisions. Negative results must be combined with clinical observations, patient history, and epidemiological information. The expected result is Negative. Fact Sheet for Patients: HairSlick.no Fact Sheet for Healthcare Providers: quierodirigir.com This test is not yet approved or cleared by the [place] FDA and  has been authorized for detection and/or diagnosis of SARS-CoV-2 by FDA under an Emergency Use Authorization (EUA). This EUA will remain  in  effect (meaning this test can be used) for the duration of the COVID-19 declaration under Section 56 4(b)(1) of the Act, 21 U.S.C. section 360bbb-3(b)(1), unless the authorization is terminated or revoked sooner. Performed at [facility], [address], [place], [place] [address]   SARS CORONAVIRUS 2 (TAT 6-24 HRS) Nasopharyngeal Nasopharyngeal Swab     Status: None   Collection Time: 03/14/20  5:00 AM   Specimen: Nasopharyngeal Swab  Result Value Ref Range Status   SARS Coronavirus 2 NEGATIVE NEGATIVE Final    Comment: (NOTE) SARS-CoV-2 target nucleic acids are NOT DETECTED. The SARS-CoV-2 RNA is generally detectable in upper and lower respiratory specimens during the acute  phase of infection. Negative results do not preclude SARS-CoV-2 infection, do not rule out co-infections with other pathogens, and should not be used as the sole basis for treatment or other patient management decisions. Negative results must be combined with clinical observations, patient history, and epidemiological information. The expected result is Negative. Fact Sheet for Patients: HairSlick.nohttps://www.fda.gov/media/138098/download Fact Sheet for Healthcare Providers: quierodirigir.comhttps://www.fda.gov/media/138095/download This test is not yet approved or cleared by the [place] FDA and  has been authorized for detection and/or diagnosis of SARS-CoV-2 by FDA under an Emergency Use Authorization (EUA). This EUA will remain  in effect (meaning this test can be used) for the duration of the COVID-19 declaration under Section 56 4(b)(1) of the Act, 21 U.S.C. section 360bbb-3(b)(1), unless the authorization is terminated or revoked sooner. Performed at [facility], [address] [place], [place] [address]   Respiratory Panel by RT PCR (Flu A&B, Covid) - Nasopharyngeal Swab     Status: None   Collection Time: 03/14/20 11:30 AM   Specimen: Nasopharyngeal Swab  Result Value Ref Range Status   SARS Coronavirus  2 by RT PCR NEGATIVE NEGATIVE Final    Comment: (NOTE) SARS-CoV-2 target nucleic acids are NOT DETECTED. The SARS-CoV-2 RNA is generally detectable in upper respiratoy specimens during the acute phase of infection. The lowest concentration of SARS-CoV-2 viral copies this assay can detect is 131 copies/mL. A negative result does not preclude SARS-Cov-2 infection and should not be used as the sole basis for treatment or other patient management decisions. A negative result may occur with  improper specimen collection/handling, submission of specimen other than nasopharyngeal swab, presence of viral mutation(s) within the areas targeted by this assay, and inadequate number of viral copies (<131 copies/mL). A negative result must be combined with clinical observations, patient history, and epidemiological information. The expected result is Negative. Fact Sheet for Patients:  https://www.moore.com/https://www.fda.gov/media/142436/download Fact Sheet for Healthcare Providers:  https://www.young.biz/https://www.fda.gov/media/142435/download This test is not yet ap proved or cleared by the [place] FDA and  has been authorized for detection and/or diagnosis of SARS-CoV-2 by FDA under an Emergency Use Authorization (EUA). This EUA will remain  in effect (meaning this test can be used) for the duration of the COVID-19 declaration under Section 564(b)(1) of the Act, 21 U.S.C. section 360bbb-3(b)(1), unless the authorization is terminated or revoked sooner.    Influenza A by PCR NEGATIVE NEGATIVE Final   Influenza B by PCR NEGATIVE NEGATIVE Final    Comment: (NOTE) The Xpert Xpress SARS-CoV-2/FLU/RSV assay is intended as an aid in  the diagnosis of influenza from Nasopharyngeal swab specimens and  should not be used as a sole basis for treatment. Nasal washings and  aspirates are unacceptable for Xpert Xpress SARS-CoV-2/FLU/RSV  testing. Fact Sheet for Patients: https://www.moore.com/https://www.fda.gov/media/142436/download Fact Sheet for Healthcare  Providers: https://www.young.biz/https://www.fda.gov/media/142435/download This test is not yet approved or cleared by the [place] FDA and  has been authorized for detection and/or diagnosis of SARS-CoV-2 by  FDA under an Emergency Use Authorization (EUA). This EUA will remain  in effect (meaning this test can be used) for the duration of the  Covid-19 declaration under Section 564(b)(1) of the Act, 21  U.S.C. section 360bbb-3(b)(1), unless the authorization is  terminated or revoked. Performed at [facility], [address] [place], [place] [address]     Radiology Reports CT ANGIO CHEST PE W OR WO CONTRAST  Result Date: 03/08/2020 CLINICAL DATA:  Hypoxia and peripheral edema EXAM: CT ANGIOGRAPHY CHEST WITH CONTRAST TECHNIQUE: Multidetector CT  imaging of the chest was performed using the standard protocol during bolus administration of intravenous contrast. Multiplanar CT image reconstructions and MIPs were obtained to evaluate the vascular anatomy. CONTRAST:  OMNIPAQUE IOHEXOL 350 MG/ML SOLN COMPARISON:  Chest x-ray from earlier in the same day, CT from 04/18/2004. FINDINGS: Cardiovascular: Thoracic aorta and its branches demonstrate atherosclerotic calcifications. No aneurysmal dilatation or dissection is seen. Coronary calcifications are noted. No significant cardiac enlargement is seen. The pulmonary artery shows a normal branching pattern without intraluminal filling defect to suggest pulmonary embolism. Mediastinum/Nodes: Thoracic inlet is within normal limits. No hilar or mediastinal adenopathy is noted. The esophagus as visualized is within normal limits. Lungs/Pleura: Lungs are well aerated bilaterally. A 5 mm nodule is noted in the medial aspect of the left upper lobe best seen on image number 21 of series 6. This has increased in size from the prior exam sixteen years previous at which time it measured 1-2 mm elevation of the right hemidiaphragm is seen. This is stable in appearance from the prior  exam. No other nodules are seen. Small right pleural effusion is noted with right basilar atelectasis. Upper Abdomen: Visualized abdomen shows no acute abnormality. Musculoskeletal: Changes of anasarca are noted peripherally consistent with that described in the lower extremities. Degenerative changes of the thoracic spine are seen. No acute compression deformity is noted. Review of the MIP images confirms the above findings. IMPRESSION: No evidence of pulmonary emboli. Small right pleural effusion with right basilar atelectasis. 5 mm nodule in the left upper lobe increased in size from 16 years ago at which time it measured 1-2 mm. No follow-up needed if patient is low-risk. Non-contrast chest CT can be considered in 12 months if patient is high-risk. This recommendation follows the consensus statement: Guidelines for Management of Incidental Pulmonary Nodules Detected on CT Images: From the Fleischner Society 2017; Radiology 2017; 284:228-243. Changes of anasarca. Aortic Atherosclerosis (ICD10-I70.0). Electronically Signed   By: Alcide Clever M.D.   On: 03/08/2020 20:33   CT ABDOMEN PELVIS W CONTRAST  Result Date: 03/14/2020 CLINICAL DATA:  Lower gastrointestinal bleed. EXAM: CT ABDOMEN AND PELVIS WITH CONTRAST TECHNIQUE: Multidetector CT imaging of the abdomen and pelvis was performed using the standard protocol following bolus administration of intravenous contrast. CONTRAST:  OMNIPAQUE IOHEXOL 300 MG/ML  SOLN COMPARISON:  None. FINDINGS: Lower chest: Small pleural effusions bilaterally, larger on the right. Subpleural compressive atelectasis in both lungs. Right hemidiaphragm elevation. Mild cardiomegaly. Four-vessel moderate to severe coronary calcification most pronounced along the LAD. Pulmonary artery hypertension, the pulmonary trunk measuring 4.6 cm diameter. Tiny paraesophageal hernia. Hepatobiliary: Serpiginous lobular enhancement in the medial right hepatic dome measuring up to 24 mm diameter  and isoenhancing with the hepatic vasculature, likely intraparenchymal varices. Fatty infiltration of the hepatic parenchyma. Normal gallbladder and biliary tree. Pancreas: Mild fatty infiltration. No acute pancreatitis. No apparent dilatation of the main pancreatic duct. Spleen: A 12 mm ovoid enhancement within the splenic parenchyma. Adrenals/Urinary Tract: Normal bilateral adrenal glands. Simple appearing bilateral renal cortical cyst measuring up to 2.5 cm on the left and 17 Hounsfield units. No right or left hydronephrosis. Normal bilateral ureters. Symmetrical mild bilateral perinephric inflammatory change, likely sequela of medical renal disease. A nonobstructing 4 mm left renal lower pole calculus. No apparent urinary bladder abnormality. Stomach/Bowel: Large stool burden without bowel obstruction. Normal appendix, coronal series 5, image 32. Diffuse mild circumferential rectal wall thickening with inflammatory change in the perirectal fat planes and small amount of 5 Hounsfield  unit free fluid in the pre sacral space. Apparent short segment mild mucosal thickening of a left lower abdominal quadrant jejunal bowel loop, coronal series 5, image 31, and apparent diffuse gastric mucosal thickening which could be due entirely to nondistention by enteric contrast. No apparent colonic wall thickening. Mild sigmoid diverticulosis coli. No apparent colonic wall thickening. Redundant sigmoid colon with adhesion formation to the right upper and lower abdominal walls, but no volvulus. Reflux of stool into the terminal ileum. No convincing evidence for contrast extravasation into the colonic bowel lumen. Vascular/Lymphatic: Aorto bi-iliac calcified atherosclerosis without an abdominal aorta aneurysm. Nonspecific bilateral inguinal, retroperitoneal and mesenteric lymph nodes. Reproductive: Scoliosis and moderate skeletal degenerative changes. Other: Small periumbilical and bilateral inguinal herniations of fat without  strangulation or incarceration. Extensive anasarca above and below the waist. Musculoskeletal: Scoliosis and moderate skeletal degenerative changes. Mild diffuse bone demineralization. Grade 1 anterolisthesis of L5 on S1, likely degenerative. IMPRESSION: An acute mild uncomplicated proctitis with small amount of free fluid in the presacral space and edema in the perirectal fat plane without perforation or abscess. Large stool burden throughout the colon. Sigmoid diverticulosis without acute diverticulitis or contrast extravasation within the colonic lumen. Apparent mild mucosal thickening of the gastric lumen and a left lower abdominal quadrant small bowel loop, which could be secondary to incomplete distension or a mild gastroenteritis. Abnormal enhancement in the medial right hepatic dome and in the splenic parenchyma, possibly intrahepatic varices and splenic hemangioma, new or not apparent on the March 08, 2020 and April 18, 2004 CT chest studies. Mild cardiomegaly with four-vessel moderate to severe coronary calcifications, small bilateral pleural effusions and bibasilar compressive atelectasis. Extensive anasarca. Nonobstructing small left renal calculus. Aortic calcified atherosclerosis. Pulmonary artery hypertension. Electronically Signed   By: Revonda Humphrey   On: 03/14/2020 18:17   US Venous Img Upper Uni Left (DVT)  Result Date: 03/09/2020 CLINICAL DATA:  Left upper extremity edema.  Evaluate for DVT. EXAM: LEFT UPPER EXTREMITY VENOUS DOPPLER ULTRASOUND TECHNIQUE: Gray-scale sonography with graded compression, as well as color Doppler and duplex ultrasound were performed to evaluate the upper extremity deep venous system from the level of the subclavian vein and including the jugular, axillary, basilic, radial, ulnar and upper cephalic vein. Spectral Doppler was utilized to evaluate flow at rest and with distal augmentation maneuvers. COMPARISON:  None. FINDINGS: Contralateral Subclavian Vein:  Respiratory phasicity is normal and symmetric with the symptomatic side. No evidence of thrombus. Normal compressibility. Internal Jugular Vein: No evidence of thrombus. Normal compressibility, respiratory phasicity and response to augmentation. Subclavian Vein: No evidence of thrombus. Normal compressibility, respiratory phasicity and response to augmentation. Axillary Vein: No evidence of thrombus. Normal compressibility, respiratory phasicity and response to augmentation. Cephalic Vein: No evidence of thrombus. Normal compressibility, respiratory phasicity and response to augmentation. Basilic Vein: No evidence of thrombus. Normal compressibility, respiratory phasicity and response to augmentation. Brachial Veins: No evidence of thrombus. Normal compressibility, respiratory phasicity and response to augmentation. Radial Veins: No evidence of thrombus. Normal compressibility, respiratory phasicity and response to augmentation. Ulnar Veins: No evidence of thrombus. Normal compressibility, respiratory phasicity and response to augmentation. Venous Reflux:  None visualized. Other Findings: There is a moderate amount of subcutaneous edema at the level of the left arm and forearm. IMPRESSION: No evidence of DVT within the left upper extremity. Electronically Signed   By: Sandi Mariscal M.D.   On: 03/09/2020 16:16   DG CHEST PORT 1 VIEW  Result Date: 03/13/2020 CLINICAL DATA:  Dyspnea EXAM: PORTABLE  CHEST 1 VIEW COMPARISON:  03/08/2020 chest radiograph. FINDINGS: Low lung volumes. Stable cardiomediastinal silhouette with top-normal heart size. No pneumothorax. Stable elevation of the right hemidiaphragm. Blunting of the right costophrenic angle. No left pleural effusion. No overt pulmonary edema. Right basilar hazy opacity. IMPRESSION: Low lung volumes. Stable elevation of the right hemidiaphragm. Hazy right basilar lung opacity, which could represent atelectasis, aspiration and/or pneumonia. New blunting of the right  costophrenic angle, cannot exclude small right pleural effusion. Electronically Signed   By: Delbert Phenix M.D.   On: 03/13/2020 13:47   DG Chest Portable 1 View  Result Date: 03/08/2020 CLINICAL DATA:  Anasarca EXAM: PORTABLE CHEST 1 VIEW COMPARISON:  2018 FINDINGS: Stable elevation of the right hemidiaphragm. No new consolidation or edema. No significant pleural effusion. Stable cardiomediastinal contours. IMPRESSION: Stable significant elevation of the right hemidiaphragm. No acute finding. Electronically Signed   By: Guadlupe Spanish M.D.   On: 03/08/2020 16:30   ECHOCARDIOGRAM COMPLETE  Result Date: 03/09/2020    ECHOCARDIOGRAM REPORT   Patient Name:   MAHIN GUARDIA Date of Exam: 03/09/2020 Medical Rec #:  [id number]       Height:       66.0 in Accession #:    [id number]      Weight:       237.0 lb Date of Birth:  03/30/1933      BSA:          2.149 m Patient Age:    86 years        BP:           106/83 mmHg Patient Gender: M               HR:           97 bpm. [facility] Procedure: 2D Echo Indications:    Swelling of both lower extremities  History:        Patient has no prior history of Echocardiogram examinations.                 Arrythmias:Atrial Fibrillation; Risk Factors:Former Smoker and                 Hypertension. Cellulitis.  Sonographer:    Jeryl Columbia RDCS (AE) Referring Phys: [id number] Heloise Beecham [facility] IMPRESSIONS  1. Left ventricular ejection fraction, by estimation, is 60 to 65%. The left ventricle has normal function. The left ventricle has no regional wall motion abnormalities. There is mild concentric left ventricular hypertrophy. Left ventricular diastolic parameters were normal. There is the interventricular septum is flattened in systole, consistent with right ventricular pressure overload.  2. Right ventricular systolic function is mildly reduced. The right ventricular size is mildly enlarged. There is moderately elevated pulmonary artery systolic pressure.  3.  Right atrial size was mildly dilated.  4. The mitral valve is degenerative. Mild mitral valve regurgitation.  5. Tricuspid valve regurgitation is moderate.  6. The aortic valve is tricuspid. Aortic valve regurgitation is not visualized. Mild aortic valve sclerosis is present, with no evidence of aortic valve stenosis.  7. The inferior vena cava is dilated in size with <50% respiratory variability, suggesting right atrial pressure of 15 mmHg. FINDINGS  Left Ventricle: Left ventricular ejection fraction, by estimation, is 60 to 65%. The left ventricle has normal function. The left ventricle has no regional wall motion abnormalities. The left ventricular internal cavity size was normal in size. There is  mild concentric left ventricular hypertrophy. The interventricular septum is  flattened in systole, consistent with right ventricular pressure overload. Left ventricular diastolic parameters were normal. Right Ventricle: The right ventricular size is mildly enlarged. No increase in right ventricular wall thickness. Right ventricular systolic function is mildly reduced. There is moderately elevated pulmonary artery systolic pressure. The tricuspid regurgitant velocity is 2.96 m/s, and with an assumed right atrial pressure of 15 mmHg, the estimated right ventricular systolic pressure is 50.0 mmHg. Left Atrium: Left atrial size was normal in size. Right Atrium: Right atrial size was mildly dilated. Pericardium: There is no evidence of pericardial effusion. Mitral Valve: The mitral valve is degenerative in appearance. There is mild thickening of the mitral valve leaflet(s). Moderate mitral annular calcification. Mild mitral valve regurgitation. Tricuspid Valve: The tricuspid valve is grossly normal. Tricuspid valve regurgitation is moderate. Aortic Valve: The aortic valve is tricuspid. . There is mild thickening and mild calcification of the aortic valve. Aortic valve regurgitation is not visualized. Mild aortic valve  sclerosis is present, with no evidence of aortic valve stenosis. Mild to moderate aortic valve annular calcification. There is mild thickening of the aortic valve. There is mild calcification of the aortic valve. Pulmonic Valve: The pulmonic valve was grossly normal. Pulmonic valve regurgitation is mild. Aorta: The aortic root is normal in size and structure. Venous: The inferior vena cava is dilated in size with less than 50% respiratory variability, suggesting right atrial pressure of 15 mmHg. IAS/Shunts: The interatrial septum was not well visualized.  LEFT VENTRICLE PLAX 2D LVIDd:         4.19 cm  Diastology LVIDs:         2.49 cm  LV e' lateral:   13.60 cm/s LV PW:         1.29 cm  LV E/e' lateral: 8.0 LV IVS:        1.12 cm  LV e' medial:    10.10 cm/s LVOT diam:     2.00 cm  LV E/e' medial:  10.8 LVOT Area:     3.14 cm  RIGHT VENTRICLE TAPSE (M-mode): 1.3 cm LEFT ATRIUM             Index       RIGHT ATRIUM           Index LA diam:        5.00 cm 2.33 cm/m  RA Area:     22.40 cm LA Vol (A2C):   48.2 ml 22.43 ml/m RA Volume:   63.50 ml  29.54 ml/m LA Vol (A4C):   44.5 ml 20.70 ml/m LA Biplane Vol: 46.6 ml 21.68 ml/m   AORTA Ao Root diam: 2.90 cm MITRAL VALVE                TRICUSPID VALVE MV Area (PHT): 3.58 cm     TR Peak grad:   35.0 mmHg MV Decel Time: 212 msec     TR Vmax:        296.00 cm/s MR Peak grad: 46.2 mmHg MR Mean grad: 25.0 mmHg     SHUNTS MR Vmax:      340.00 cm/s   Systemic Diam: 2.00 cm MR Vmean:     236.0 cm/s MV E velocity: 108.67 cm/s Prentice Docker MD Electronically signed by Prentice Docker MD Signature Date/Time: 03/09/2020/12:59:08 PM    Final      CBC Recent Labs  Lab 03/10/20 1028 03/11/20 0427 03/13/20 0627 03/14/20 1539 03/15/20 0553  WBC 17.0* 16.4* 13.8* 19.4* 14.9*  HGB 16.4 16.0 15.1 16.7  16.2  HCT 52.2* 50.8 49.4 52.7* 51.3  PLT 108* 100* 85* 87* 77*  MCV 100.4* 100.6* 103.1* 101.3* 101.8*  MCH 31.5 31.7 31.5 32.1 32.1  MCHC 31.4 31.5 30.6 31.7 31.6    RDW 20.0* 19.6* 19.1* 19.3* 19.5*  LYMPHSABS  --   --   --  0.9  --   MONOABS  --   --   --  1.8*  --   EOSABS  --   --   --  0.2  --   BASOSABS  --   --   --  0.0  --     Chemistries  Recent Labs  Lab 03/09/20 0554 03/10/20 1028 03/11/20 0427 03/13/20 0627 03/14/20 1539  NA 140 140 142 140 140  K 4.2 3.7 3.6 3.7 3.9  CL 101 97* 96* 93* 94*  CO2 31 35* 36* 40* 39*  GLUCOSE 76 112* 86 94 114*  BUN 29* 29* 26* 21 20  CREATININE 1.24 1.25* 1.14 0.87 0.74  CALCIUM 9.1 8.9 8.5* 8.1* 8.3*  MG  --   --  2.3  --   --   AST 30  --   --   --  23  ALT 27  --   --   --  21  ALKPHOS 72  --   --   --  74  BILITOT 2.2*  --   --   --  2.8*   ------------------------------------------------------------------------------------------------------------------ No results for input(s): CHOL, HDL, LDLCALC, TRIG, CHOLHDL, LDLDIRECT in the last 72 hours.  No results found for: HGBA1C ------------------------------------------------------------------------------------------------------------------ No results for input(s): TSH, T4TOTAL, T3FREE, THYROIDAB in the last 72 hours.  Invalid input(s): FREET3 ------------------------------------------------------------------------------------------------------------------ No results for input(s): VITAMINB12, FOLATE, FERRITIN, TIBC, IRON, RETICCTPCT in the last 72 hours.  Coagulation profile Recent Labs  Lab 03/14/20 1539  INR 1.6*    No results for input(s): DDIMER in the last 72 hours.  Cardiac Enzymes No results for input(s): CKMB, TROPONINI, MYOGLOBIN in the last 168 hours.  Invalid input(s): CK ------------------------------------------------------------------------------------------------------------------    Component Value Date/Time   BNP 280.0 (H) 03/08/2020 1535     Shon Hale M.D on 03/15/2020 at 7:20 PM  Go to [email or web address] - for contact info  [facility] - Office  [phone]

## 2020-03-15 NOTE — Progress Notes (Signed)
This encounter was created in error - please disregard.

## 2020-03-15 NOTE — Consult Note (Signed)
Referring Provider: [facility] Primary Care Physician:  [name], MD Primary Gastroenterologist:  Dr. Darrick Penna (Previously Gentry Fitz)   Date of Admission: 03/14/20 Date of Consultation: 03/15/20  Reason for Consultation:  GI bleed  HPI:  Jesus Fowler is a 86 y.o. year old male with history significant for atrial fibrillation on Eliquis currently at the [facility] nursing facility recently admitted from 03/08/20-03/14/20 for anasarca suspected to be secondary to HFpEF/CHF with BNP 280 and cellulitis of his left arm treated with Levaqion which had improved prior to discharge. When the patient arrived at the nursing facility, the nurse practitioner went in to evaluate the patient and found him to be laying in a puddle of blood thought to be related to rectal bleeding.  He was sent back to the ED for evaluation.  ED Course: Hemodynamically stable.  Rectal exam in the ED with significant amount of bright and dark red blood on the sheets and in rectal vault with formed stool present as well. Hemoglobin 16.7, WBC 19.4, platelets 87 (L), LFTs within normal limits, total bilirubin 2.8, electrolytes and kidney function within normal limits.  INR elevated at 1.6.    CT abdomen and pelvis: 1. An acute mild uncomplicated proctitis with small amount of free fluid in the presacral space and edema in the perirectal fat plane without perforation or abscess.  2. Large stool burden throughout the colon. Sigmoid diverticulosis without acute diverticulitis. 3. Apparent mild mucosal thickening of the gastric lumen and a left lower abdominal quadrant small bowel loop, which could be secondary to incomplete distension or a mild gastroenteritis. 4. Abnormal enhancement in the medial right hepatic dome and in the splenic parenchyma, possibly intrahepatic varices and splenic hemangioma. 5. Mild cardiomegaly with four-vessel moderate to severe coronary calcifications, small bilateral pleural effusions and  bibasilar compressive atelectasis. Extensive anasarca. 6. Nonobstructing small left renal calculus. 7. Aortic calcified atherosclerosis. 8. Pulmonary artery hypertension.  Today:  Reports having several hemorrhoid surgeries many years ago with ongoing intermittent rectal bleeding since then.  In the last year, reports he has had to wear something in his pants to keep from messing them up with blood. Prior to this episode of rectal bleeding, it has been a couple months since he has had any rectal bleeding. Was living at home with his wife prior to last hospitalization. No prior colonoscopy. No personal or family history of Crohns or UC. Has been struggling with constipation intermittently. Will take Clearlax or Miralax at home and this helps. No diarrhea. Prior to hospitalizations, would have a small BM every few days. Stools were hard. Some straining. Blood will usually be on the outside of the stool or in toilet water.   Reports having a few BMs yesterday prior to leaving the hospital. States he doesn't know when he has a BM. Not sure if he has had a BM today. No abdominal pain. No nausea or vomiting.  No GERD symptoms.  Admits to pill dysphagia. States it is due to the way nurses give him his pills.  Foods get hung in his throat and sometimes he has to bring them back up. Started during his last admission. Not sure if he has food dysphagia. States he can't remember when he had solid food last. No trouble with liquids as long as he drinks then slowly in small amounts.  No family history of colon cancer. No unintentional weight loss. Appetite is poor. Wants to eat but gets tired. Also reports early satiety that has been present for a  couple of months.   Patient is unsure when he last took Eliquis.   Discussed role of possible endoscopic procedures with patient.  He states he would like to think about this further before making any decisions.  Spoke with the nurse. No BM since being on the floor. No  rectal bleeding.   Past Medical History:  Diagnosis Date  . A-fib (HCC)   . Cellulitis   . Hemorrhoid   . Hypertension   . Prostate disorder     History reviewed. No pertinent surgical history.  Prior to Admission medications   Medication Sig Start Date End Date Taking? Authorizing Provider  acetaminophen (TYLENOL) 325 MG tablet Take 2 tablets (650 mg total) by mouth every 6 (six) hours as needed for mild pain, fever or headache (or Fever >/= 101). 03/14/20  Yes Emokpae, Courage, MD  albuterol (VENTOLIN HFA) 108 (90 Base) MCG/ACT inhaler Inhale 2 puffs into the lungs every 6 (six) hours as needed for wheezing or shortness of breath.   Yes [provider]  amoxicillin-clavulanate (AUGMENTIN) 875-125 MG tablet Take 1 tablet by mouth 2 (two) times daily for 3 days. 03/14/20 03/17/20 Yes Emokpae, Courage, MD  ascorbic acid (VITAMIN C) 500 MG tablet Take 1,000 mg by mouth daily.   Yes [provider]  Cholecalciferol (VITAMIN D3) 125 MCG (5000 UT) CAPS Take 15,000 Units by mouth daily.   Yes [provider]  ELIQUIS 5 MG TABS tablet Take 2.5 mg by mouth 2 (two) times daily.  01/06/20  Yes [provider]  erythromycin ophthalmic ointment Place into both eyes 3 (three) times daily for 3 days. 03/14/20 03/17/20 Yes Emokpae, Courage, MD  feeding supplement, ENSURE ENLIVE, (ENSURE ENLIVE) LIQD Take 237 mLs by mouth 2 (two) times daily between meals. 03/14/20  Yes Emokpae, Courage, MD  HYDROcodone-acetaminophen (NORCO/VICODIN) 5-325 MG tablet Take 1 tablet by mouth every 6 (six) hours as needed for moderate pain. 03/14/20  Yes Emokpae, Courage, MD  hydrocortisone cream 1 % Apply 1 application topically 3 (three) times daily as needed for itching (minor skin irritation). 03/14/20  Yes Shon HaleEmokpae, Courage, MD  hydrOXYzine (ATARAX/VISTARIL) 25 MG tablet Take 1 tablet (25 mg total) by mouth 3 (three) times daily as needed for anxiety. 03/14/20  Yes Emokpae, Courage, MD  metoprolol  succinate (TOPROL-XL) 25 MG 24 hr tablet Take 0.5 tablets (12.5 mg total) by mouth daily. 03/15/20  Yes Shon HaleEmokpae, Courage, MD  NON FORMULARY May use CPAP from home with home settings. At Bedtime   Yes [provider]  NON FORMULARY Dysphagia 2 thin liquids   Yes [provider]  Omega-3 Fatty Acids (FISH OIL BURP-LESS PO) Take 2 capsules by mouth daily.   Yes [provider]  OXYGEN Inhale 2 L into the lungs continuous.   Yes [provider]  polyethylene glycol (MIRALAX) 17 g packet Take 17 g by mouth daily. 03/14/20  Yes Emokpae, Courage, MD  potassium chloride (KLOR-CON) 10 MEQ tablet Take 10 mEq by mouth daily. 03/07/20  Yes [provider]  Probiotic Product (RISA-BID PROBIOTIC PO) Take 1 capsule by mouth daily.   Yes [provider]  rosuvastatin (CRESTOR) 10 MG tablet Take 10 mg by mouth in the morning.  02/07/20  Yes [provider]  senna-docusate (SENOKOT-S) 8.6-50 MG tablet Take 2 tablets by mouth at bedtime. 03/14/20 03/14/21 Yes Emokpae, Courage, MD  sodium chloride (OCEAN) 0.65 % SOLN nasal spray Place 2 sprays into both nostrils 3 (three) times daily. 03/14/20  Yes Emokpae, Courage, MD  torsemide (DEMADEX) 20 MG tablet Take 1 tablet (20 mg total) by mouth daily. 03/14/20  Yes Emokpae, Courage, MD  Vitamin E 400 units TABS Take 2 tablets by mouth daily.    Yes [provider]  tamsulosin (FLOMAX) 0.4 MG CAPS capsule Take 0.4 mg by mouth daily. 02/07/20   [provider]    Current Facility-Administered Medications  Medication Dose Route Frequency Provider Last Rate Last Admin  . 0.9 %  sodium chloride infusion   Intravenous Continuous Norins, Rosalyn Gess, MD      . acetaminophen (TYLENOL) tablet 650 mg  650 mg Oral Q6H PRN Norins, Rosalyn Gess, MD      . albuterol (PROVENTIL) (2.5 MG/3ML) 0.083% nebulizer solution 3 mL  3 mL Inhalation Q6H PRN Norins, Rosalyn Gess, MD      . amoxicillin-clavulanate (AUGMENTIN) 875-125 MG  per tablet 1 tablet  1 tablet Oral BID Norins, Rosalyn Gess, MD   1 tablet at 03/15/20 0041  . chlorhexidine (PERIDEX) 0.12 % solution 15 mL  15 mL Mouth Rinse BID Norins, Rosalyn Gess, MD      . erythromycin ophthalmic ointment   Both Eyes TID Norins, Rosalyn Gess, MD      . feeding supplement (ENSURE ENLIVE) (ENSURE ENLIVE) liquid 237 mL  237 mL Oral BID BM Norins, Rosalyn Gess, MD      . hydrOXYzine (ATARAX/VISTARIL) tablet 25 mg  25 mg Oral TID PRN Jacques Navy, MD      . MEDLINE mouth rinse  15 mL Mouth Rinse q12n4p Norins, Rosalyn Gess, MD      . metoprolol succinate (TOPROL-XL) 24 hr tablet 12.5 mg  12.5 mg Oral Daily Norins, Rosalyn Gess, MD   12.5 mg at 03/15/20 0041  . omega-3 acid ethyl esters (LOVAZA) capsule 2 g  2 g Oral Daily Norins, Rosalyn Gess, MD      . polyethylene glycol (MIRALAX / GLYCOLAX) packet 17 g  17 g Oral Daily Norins, Rosalyn Gess, MD   17 g at 03/15/20 0046  . potassium chloride (KLOR-CON) CR tablet 10 mEq  10 mEq Oral Daily Norins, Rosalyn Gess, MD      . rosuvastatin (CRESTOR) tablet 10 mg  10 mg Oral q1800 Norins, Rosalyn Gess, MD      . sodium chloride (OCEAN) 0.65 % nasal spray 2 spray  2 spray Each Nare TID Norins, Rosalyn Gess, MD      . tamsulosin (FLOMAX) capsule 0.4 mg  0.4 mg Oral Daily Norins, Rosalyn Gess, MD   0.4 mg at 03/15/20 0046  . torsemide (DEMADEX) tablet 20 mg  20 mg Oral Daily Norins, Rosalyn Gess, MD      . vitamin E capsule 800 Units  800 Units Oral Daily Norins, Rosalyn Gess, MD        Allergies as of 03/14/2020  . (No Known Allergies)    History reviewed. No pertinent family history.  Social History   Socioeconomic History  . Marital status: Married    Spouse name: Not on file  . Number of children: Not on file  . Years of education: Not on file  . Highest education level: Not on file  Occupational History  . Not on file  Tobacco Use  . Smoking status: Former Games developer  . Smokeless tobacco: Never Used  Substance and Sexual Activity  . Alcohol use: Never  .  Drug use: Never  . Sexual activity: Not on file  Other Topics Concern  .  Not on file  Social History Narrative  . Not on file   Social Determinants of Health   Financial Resource Strain:   . Difficulty of Paying Living Expenses:   Food Insecurity:   . Worried About Programme researcher, broadcasting/film/video in the Last Year:   . Barista in the Last Year:   Transportation Needs:   . Freight forwarder (Medical):   Marland Kitchen Lack of Transportation (Non-Medical):   Physical Activity:   . Days of Exercise per Week:   . Minutes of Exercise per Session:   Stress:   . Feeling of Stress :   Social Connections:   . Frequency of Communication with Friends and Family:   . Frequency of Social Gatherings with Friends and Family:   . Attends Religious Services:   . Active Member of Clubs or Organizations:   . Attends Banker Meetings:   Marland Kitchen Marital Status:   Intimate Partner Violence:   . Fear of Current or Ex-Partner:   . Emotionally Abused:   Marland Kitchen Physically Abused:   . Sexually Abused:     Review of Systems: Gen: Denies fever, chills, pre-syncope or syncope. Has significant weakness/deconditioning.  CV: Denies chest pain. Occasional heart palpitations.  Resp: Chronic shortness of breath. Occasional cough.  GI: See HPI  GU : Denies urinary burning, urinary frequency, urinary incontinence.  Derm: Gets water blisters and has redness of left arm from cellulitis.  Psych: Denies depression. Admits to having panic attacks.  Heme: See HPI  Physical Exam: Vital signs in last 24 hours: Temp:  [97.7 F (36.5 C)-98.6 F (37 C)] 97.7 F (36.5 C) (03/16 0432) Pulse Rate:  [67-106] 80 (03/16 0432) Resp:  [12-27] 16 (03/16 0432) BP: (94-141)/(60-82) 112/74 (03/16 0432) SpO2:  [87 %-100 %] 96 % (03/16 0432) Weight:  [99.1 kg-100.2 kg] 99.1 kg (03/16 0432) Last BM Date: 03/15/20 General:   Alert,  pleasant and cooperative in NAD, chronically ill appearing.  Nasal cannula in place, on 1.5 L.  Head:   Normocephalic and atraumatic. Eyes:  Sclera clear, no icterus.   Conjunctiva pink. Ears:  Normal auditory acuity. Lungs:  Clear throughout to auscultation.   No wheezes, crackles, or rhonchi. No acute distress. Heart:  Irregularly irregular rate and rhythm. No murmurs, clicks, rubs,  or gallops. Abdomen:  Soft, nontender and nondistended. Anasarca present. No masses, hepatosplenomegaly or hernias noted. Normal bowel sounds, without guarding, and without rebound.   Rectal:  Deferred.  GU: Urine is amber in color in collection container. Msk:  Symmetrical without gross deformities.  Extremities:  With bilateral pitting edema into his thighs. Chronic venous stasis changes.  Neurologic:  Alert and  oriented x4;  grossly normal neurologically. Skin:  Large open blister on left LE. Erythema of left arm with no significant warmth.   Psych: Normal mood and affect.  Intake/Output from previous day: 03/15 0701 - 03/16 0700 In: -  Out: 200 [Urine:200] Intake/Output this shift: Total I/O In: -  Out: 1 [Stool:1]  Lab Results: Recent Labs    03/13/20 0627 03/14/20 1539 03/15/20 0553  WBC 13.8* 19.4* 14.9*  HGB 15.1 16.7 16.2  HCT 49.4 52.7* 51.3  PLT 85* 87* 77*   BMET Recent Labs    03/13/20 0627 03/14/20 1539  NA 140 140  K 3.7 3.9  CL 93* 94*  CO2 40* 39*  GLUCOSE 94 114*  BUN 21 20  CREATININE 0.87 0.74  CALCIUM 8.1* 8.3*   LFT Recent Labs  03/14/20 1539  PROT 5.7*  ALBUMIN 2.7*  AST 23  ALT 21  ALKPHOS 74  BILITOT 2.8*   PT/INR Recent Labs    03/14/20 1539  LABPROT 18.5*  INR 1.6*   Studies/Results: CT ABDOMEN PELVIS W CONTRAST  Result Date: 03/14/2020 CLINICAL DATA:  Lower gastrointestinal bleed. EXAM: CT ABDOMEN AND PELVIS WITH CONTRAST TECHNIQUE: Multidetector CT imaging of the abdomen and pelvis was performed using the standard protocol following bolus administration of intravenous contrast. CONTRAST:  176mL OMNIPAQUE IOHEXOL 300 MG/ML  SOLN  COMPARISON:  None. FINDINGS: Lower chest: Small pleural effusions bilaterally, larger on the right. Subpleural compressive atelectasis in both lungs. Right hemidiaphragm elevation. Mild cardiomegaly. Four-vessel moderate to severe coronary calcification most pronounced along the LAD. Pulmonary artery hypertension, the pulmonary trunk measuring 4.6 cm diameter. Tiny paraesophageal hernia. Hepatobiliary: Serpiginous lobular enhancement in the medial right hepatic dome measuring up to 24 mm diameter and isoenhancing with the hepatic vasculature, likely intraparenchymal varices. Fatty infiltration of the hepatic parenchyma. Normal gallbladder and biliary tree. Pancreas: Mild fatty infiltration. No acute pancreatitis. No apparent dilatation of the main pancreatic duct. Spleen: A 12 mm ovoid enhancement within the splenic parenchyma. Adrenals/Urinary Tract: Normal bilateral adrenal glands. Simple appearing bilateral renal cortical cyst measuring up to 2.5 cm on the left and 17 Hounsfield units. No right or left hydronephrosis. Normal bilateral ureters. Symmetrical mild bilateral perinephric inflammatory change, likely sequela of medical renal disease. A nonobstructing 4 mm left renal lower pole calculus. No apparent urinary bladder abnormality. Stomach/Bowel: Large stool burden without bowel obstruction. Normal appendix, coronal series 5, image 32. Diffuse mild circumferential rectal wall thickening with inflammatory change in the perirectal fat planes and small amount of 5 Hounsfield unit free fluid in the pre sacral space. Apparent short segment mild mucosal thickening of a left lower abdominal quadrant jejunal bowel loop, coronal series 5, image 31, and apparent diffuse gastric mucosal thickening which could be due entirely to nondistention by enteric contrast. No apparent colonic wall thickening. Mild sigmoid diverticulosis coli. No apparent colonic wall thickening. Redundant sigmoid colon with adhesion formation to  the right upper and lower abdominal walls, but no volvulus. Reflux of stool into the terminal ileum. No convincing evidence for contrast extravasation into the colonic bowel lumen. Vascular/Lymphatic: Aorto bi-iliac calcified atherosclerosis without an abdominal aorta aneurysm. Nonspecific bilateral inguinal, retroperitoneal and mesenteric lymph nodes. Reproductive: Scoliosis and moderate skeletal degenerative changes. Other: Small periumbilical and bilateral inguinal herniations of fat without strangulation or incarceration. Extensive anasarca above and below the waist. Musculoskeletal: Scoliosis and moderate skeletal degenerative changes. Mild diffuse bone demineralization. Grade 1 anterolisthesis of L5 on S1, likely degenerative. IMPRESSION: An acute mild uncomplicated proctitis with small amount of free fluid in the presacral space and edema in the perirectal fat plane without perforation or abscess. Large stool burden throughout the colon. Sigmoid diverticulosis without acute diverticulitis or contrast extravasation within the colonic lumen. Apparent mild mucosal thickening of the gastric lumen and a left lower abdominal quadrant small bowel loop, which could be secondary to incomplete distension or a mild gastroenteritis. Abnormal enhancement in the medial right hepatic dome and in the splenic parenchyma, possibly intrahepatic varices and splenic hemangioma, new or not apparent on the March 08, 2020 and April 18, 2004 CT chest studies. Mild cardiomegaly with four-vessel moderate to severe coronary calcifications, small bilateral pleural effusions and bibasilar compressive atelectasis. Extensive anasarca. Nonobstructing small left renal calculus. Aortic calcified atherosclerosis. Pulmonary artery hypertension. Electronically Signed   By: Revonda Humphrey  On: 03/14/2020 18:17   DG CHEST PORT 1 VIEW  Result Date: 03/13/2020 CLINICAL DATA:  Dyspnea EXAM: PORTABLE CHEST 1 VIEW COMPARISON:  03/08/2020 chest  radiograph. FINDINGS: Low lung volumes. Stable cardiomediastinal silhouette with top-normal heart size. No pneumothorax. Stable elevation of the right hemidiaphragm. Blunting of the right costophrenic angle. No left pleural effusion. No overt pulmonary edema. Right basilar hazy opacity. IMPRESSION: Low lung volumes. Stable elevation of the right hemidiaphragm. Hazy right basilar lung opacity, which could represent atelectasis, aspiration and/or pneumonia. New blunting of the right costophrenic angle, cannot exclude small right pleural effusion. Electronically Signed   By: Delbert Phenix M.D.   On: 03/13/2020 13:47    Impression: 86 year old male with history significant for atrial fibrillation on Eliquis recently discharged to [facility] after admission from 03/08/2020-03/14/2020 for anasarca suspected to be secondary to HFpEF/CHF and cellulitis of his left arm treated with Levaquin and discharged on Augmentin (improving). He presented back to the emergency department on the same day of his discharge due to being found in a puddle of blood upon arrival to the [facility].  Patient remained hemodynamically stable in the ED.  Hemoglobin 16.7.   Rectal exam in the ED was significant amount of bright and dark red blood on the sheets and in rectal vault with formed stool present.  CT abdomen and pelvis with large stool burden, acute mild uncomplicated proctitis with small amount of free fluid in the presacral space and edema in the perirectal fat plane without perforation or abscess. He was admitted for rectal bleeding and proctitis. Also with leukocytosis and anasarca with management per hospitalists.   Rectal Bleeding: BRBPR on 03/14/20. Hemoglobin stable and within normal limits. No further rectal bleeding since admission. Reports several hemorrhoid surgeries years ago with ongoing intermittent rectal bleeding since then. Last episode of rectal bleeding was a couple months ago prior to this recurrence. No prior  colonoscopy. Admits to chronic intermittent constipation. Denies abdominal pain, melena, GERD symptoms, or NSAID use. No family history of IBD or colon cancer. Suspect rectal bleeding may be hemorrhoidal. Not consistent with diverticular bleed. With proctitis noted on CT, I feel this warrants endoscopic evaluation. Discussed TCS with patient. States he can not drink the volume for the bowel prep. Would like to think about having a flex sig.  Suspect last dose of Eliquis was the morning of 03/14/2020.  Spoke with Dr. Darrick Penna.  Earliest procedures would likely be Thursday if patient is agreeable.Would need propofol.  Constipation: Not adequately controlled.  Large stool burden noted on CT scan.  Has been managing on his own with MiraLAX or ClearLax as needed.  I will stop MiraLAX and start Linzess 145 mcg daily.  Dysphagia: Patient reports dysphagia to pills.  States they get hung in his throat and sometimes he has to bring them back up.  Prior evaluation with speech therapy during last admission with bedside swallow eval. Oral phase is prolonged and inefficient with solids. Suspected an element of esophageal dysphagia given very slow intake of po and occasional eructation. Recommended D2 and thin liquid diet with feeder assist. Dysphagia likely needs further evaluation with EGD to rule out and or provide therapeutic treatment of esophageal web, ring, or structure. Patient would like to think about this more prior to making a decision. Will discuss further with Dr. Darrick Penna.   Early Satiety: Reports early satiety and poor appetite for the last couple of months. No abdominal pain, nausea, vomiting, or typical GERD symptoms.  CT abdomen pelvis  this admission with apparent mild mucosal thickening of the gastric lumen and a left lower abdominal quadrant small bowel loop which could be secondary to incomplete distention or mild gastroenteritis.  Patient does not have symptoms of gastroenteritis.  Suspect thickening may  be related to incomplete distention; however, with early satiety, I feel it would likely worthwhile to evaluate this with EGD.  Early satiety may also be related to his other underlying illnesses including cellulitis as well as significant anasarca. Again, patient would like to think on having procedures prior to making a decision.   Plan: Possibly pursue Flex sig and EGD +/- dilation on Thursday (03/17/20) as last dose of Eliquis was suspected to be the morning of 03/14/20. Would recommend propofol for procedure.  Will need to discuss further with Dr. Darrick Penna and with patient again to determine if he is agreeable to procedures.  Continue to hold Eliquis for now.  Start Linzess 145 mcg.  MiraLAX 17 g daily prn.    Monitor for any overt GI bleeding.  Follow H/H. Ok to advance diet as tolerated today.     LOS: 1 day    03/15/2020, 8:13 AM   Ermalinda Memos, PA-C [facility]

## 2020-03-16 ENCOUNTER — Other Ambulatory Visit: Payer: Self-pay

## 2020-03-16 DIAGNOSIS — K6289 Other specified diseases of anus and rectum: Secondary | ICD-10-CM

## 2020-03-16 DIAGNOSIS — R6881 Early satiety: Secondary | ICD-10-CM

## 2020-03-16 DIAGNOSIS — R1310 Dysphagia, unspecified: Secondary | ICD-10-CM

## 2020-03-16 DIAGNOSIS — K921 Melena: Secondary | ICD-10-CM

## 2020-03-16 LAB — CBC
HCT: 49.5 % (ref 39.0–52.0)
Hemoglobin: 15.3 g/dL (ref 13.0–17.0)
MCH: 31.7 pg (ref 26.0–34.0)
MCHC: 30.9 g/dL (ref 30.0–36.0)
MCV: 102.7 fL — ABNORMAL HIGH (ref 80.0–100.0)
Platelets: 83 10*3/uL — ABNORMAL LOW (ref 150–400)
RBC: 4.82 MIL/uL (ref 4.22–5.81)
RDW: 19.8 % — ABNORMAL HIGH (ref 11.5–15.5)
WBC: 11.8 10*3/uL — ABNORMAL HIGH (ref 4.0–10.5)
nRBC: 0 % (ref 0.0–0.2)

## 2020-03-16 MED ORDER — FLEET ENEMA 7-19 GM/118ML RE ENEM: 1.0000 | ENEMA | Freq: Once | RECTAL | Status: DC

## 2020-03-16 NOTE — Care Management Important Message (Signed)
Important Message  Patient Details  Name: Jesus Fowler MRN: [id number] Date of Birth: [birth year]   Medicare Important Message Given:  Yes     Corey Harold 03/16/2020, 2:57 PM

## 2020-03-16 NOTE — [place] FL2 (Signed)
[place] MEDICAID FL2 LEVEL OF CARE SCREENING TOOL     IDENTIFICATION  Patient Name: Jesus Fowler Birthdate: [birth year] Sex: male Admission Date (Current Location): 03/14/2020  [facility] and [place] Number:  [employer] and Address:  [facility],  [address] [place] [address]      Provider Number: [id number]  Attending Physician Name and Address:  Shon Hale, MD  Relative Name and Phone Number:       Current Level of Care: Hospital Recommended Level of Care: Skilled Nursing Facility Prior Approval Number:    Date Approved/Denied:   PASRR Number:    Discharge Plan: SNF    Current Diagnoses: Patient Active Problem List   Diagnosis Date Noted  . Proctitis   . Rectal bleeding   . Dysphagia   . Early satiety   . Acute respiratory failure with hypoxia (HCC) 03/14/2020  . OSA on CPAP 03/14/2020  . Obesity (BMI 30-39.9) 03/14/2020  . Cellulitis 03/14/2020  . Venous stasis dermatitis of both lower extremities 03/14/2020  . Constipated 03/14/2020  . Anticoagulated for Afib 03/14/2020  . Acute on chronic heart failure with preserved ejection fraction (HFpEF) /Diastolic Dysfunction 03/14/2020  . Hematochezia 03/14/2020  . Swelling of lower extremity 03/08/2020  . Atrial fibrillation, chronic (HCC) 03/08/2020  . Swelling of both lower extremities 03/08/2020    Orientation RESPIRATION BLADDER Height & Weight     Self, Time, Situation, Place  Normal(see DC summary) Continent Weight: 99.1 kg Height:  5\' 6"  (167.6 cm)  BEHAVIORAL SYMPTOMS/MOOD NEUROLOGICAL BOWEL NUTRITION STATUS      Continent Diet(see DC summary)  AMBULATORY STATUS COMMUNICATION OF NEEDS Skin   Extensive Assist Verbally Normal                       Personal Care Assistance Level of Assistance  Bathing, Feeding, Dressing Bathing Assistance: Limited assistance Feeding assistance: Independent Dressing Assistance: Limited assistance     Functional Limitations  Info  Hearing, Sight, Speech Sight Info: Adequate Hearing Info: Adequate Speech Info: Adequate    SPECIAL CARE FACTORS FREQUENCY  PT (By licensed PT)     PT Frequency: 5 x/week OT Frequency: 3x/week            Contractures Contractures Info: Not present    Additional Factors Info  Code Status, Allergies Code Status Info: full Allergies Info: nka           Current Medications (03/16/2020):  This is the current hospital active medication list Current Facility-Administered Medications  Medication Dose Route Frequency Provider Last Rate Last Admin  . 0.9 %  sodium chloride infusion   Intravenous Continuous Norins, 03/18/2020, MD      . acetaminophen (TYLENOL) tablet 650 mg  650 mg Oral Q6H PRN Rosalyn Gess, MD   650 mg at 03/16/20 0147  . albuterol (PROVENTIL) (2.5 MG/3ML) 0.083% nebulizer solution 3 mL  3 mL Inhalation Q6H PRN Norins, 03/18/20, MD   3 mL at 03/15/20 1327  . amoxicillin-clavulanate (AUGMENTIN) 875-125 MG per tablet 1 tablet  1 tablet Oral BID Norins, 03/17/20, MD   1 tablet at 03/16/20 0801  . chlorhexidine (PERIDEX) 0.12 % solution 15 mL  15 mL Mouth Rinse BID Norins, 03/18/20, MD   15 mL at 03/16/20 0800  . erythromycin ophthalmic ointment   Both Eyes TID 03/18/20, MD   Given at 03/16/20 0802  . feeding supplement (ENSURE ENLIVE) (ENSURE ENLIVE) liquid 237 mL  237 mL Oral BID BM Norins, Heinz Knuckles, MD   237 mL at 03/16/20 0802  . hydrOXYzine (ATARAX/VISTARIL) tablet 25 mg  25 mg Oral TID PRN Norins, Heinz Knuckles, MD      . linaclotide [facility]) capsule 145 mcg  145 mcg Oral QAC breakfast Erenest Rasher, [place]   145 mcg at 03/16/20 0801  . MEDLINE mouth rinse  15 mL Mouth Rinse q12n4p Norins, Heinz Knuckles, MD   15 mL at 03/15/20 1609  . metoprolol succinate (TOPROL-XL) 24 hr tablet 12.5 mg  12.5 mg Oral Daily Norins, Heinz Knuckles, MD   12.5 mg at 03/16/20 0801  . omega-3 acid ethyl esters (LOVAZA) capsule 2 g  2 g Oral Daily Norins, Heinz Knuckles, MD   2 g  at 03/16/20 0803  . polyethylene glycol (MIRALAX / GLYCOLAX) packet 17 g  17 g Oral Daily PRN Aliene Altes S, PA-C      . potassium chloride (KLOR-CON) CR tablet 10 mEq  10 mEq Oral Daily Norins, Heinz Knuckles, MD   10 mEq at 03/16/20 0801  . rosuvastatin (CRESTOR) tablet 10 mg  10 mg Oral q1800 Norins, Heinz Knuckles, MD   10 mg at 03/15/20 1825  . sodium chloride (OCEAN) 0.65 % nasal spray 2 spray  2 spray Each Nare TID Neena Rhymes, MD   2 spray at 03/16/20 0803  . tamsulosin (FLOMAX) capsule 0.4 mg  0.4 mg Oral Daily Norins, Heinz Knuckles, MD   0.4 mg at 03/16/20 0804  . torsemide (DEMADEX) tablet 20 mg  20 mg Oral Daily Norins, Heinz Knuckles, MD   20 mg at 03/16/20 0802  . vitamin E capsule 800 Units  800 Units Oral Daily Norins, Heinz Knuckles, MD   800 Units at 03/16/20 0800     Discharge Medications: Please see discharge summary for a list of discharge medications.  Relevant Imaging Results:  Relevant Lab Results:   Additional Information SSN [id number]  Ivon Roedel, Chauncey Reading, RN

## 2020-03-16 NOTE — H&P (View-Only) (Signed)
Subjective:  No complaints. Last BM yesterday. He is not sure if there was blood. None reported.   Objective: Vital signs in last 24 hours: Temp:  [98 F (36.7 C)-98.8 F (37.1 C)] 98.7 F (37.1 C) (03/17 0527) Pulse Rate:  [75-96] 75 (03/17 0527) Resp:  [16-18] 16 (03/17 0527) BP: (104-112)/(71-80) 104/80 (03/17 0527) SpO2:  [95 %-97 %] 95 % (03/17 0754) Last BM Date: 03/15/20 General:   Alert,  Well-developed, well-nourished, pleasant and cooperative in NAD Head:  Normocephalic and atraumatic. Eyes:  Sclera clear, no icterus.  Abdomen:  Soft, nontender and nondistended. Normal bowel sounds, without guarding, and without rebound.   Neurologic:  Alert and  oriented x4;  grossly normal neurologically. Psych:  Alert and cooperative. Normal mood and affect.  Intake/Output from previous day: 03/16 0701 - 03/17 0700 In: 600 [P.O.:600] Out: 851 [Urine:850; Stool:1] Intake/Output this shift: No intake/output data recorded.  Lab Results: CBC Recent Labs    03/14/20 1539 03/15/20 0553 03/16/20 0607  WBC 19.4* 14.9* 11.8*  HGB 16.7 16.2 15.3  HCT 52.7* 51.3 49.5  MCV 101.3* 101.8* 102.7*  PLT 87* 77* 83*   BMET Recent Labs    03/14/20 1539  NA 140  K 3.9  CL 94*  CO2 39*  GLUCOSE 114*  BUN 20  CREATININE 0.74  CALCIUM 8.3*   LFTs Recent Labs    03/14/20 1539  BILITOT 2.8*  ALKPHOS 74  AST 23  ALT 21  PROT 5.7*  ALBUMIN 2.7*   No results for input(s): LIPASE in the last 72 hours. PT/INR Recent Labs    03/14/20 1539  LABPROT 18.5*  INR 1.6*      Imaging Studies: CT ANGIO CHEST PE W OR WO CONTRAST  Result Date: 03/08/2020 CLINICAL DATA:  Hypoxia and peripheral edema EXAM: CT ANGIOGRAPHY CHEST WITH CONTRAST TECHNIQUE: Multidetector CT imaging of the chest was performed using the standard protocol during bolus administration of intravenous contrast. Multiplanar CT image reconstructions and MIPs were obtained to evaluate the vascular anatomy. CONTRAST:   OMNIPAQUE IOHEXOL 350 MG/ML SOLN COMPARISON:  Chest x-ray from earlier in the same day, CT from 04/18/2004. FINDINGS: Cardiovascular: Thoracic aorta and its branches demonstrate atherosclerotic calcifications. No aneurysmal dilatation or dissection is seen. Coronary calcifications are noted. No significant cardiac enlargement is seen. The pulmonary artery shows a normal branching pattern without intraluminal filling defect to suggest pulmonary embolism. Mediastinum/Nodes: Thoracic inlet is within normal limits. No hilar or mediastinal adenopathy is noted. The esophagus as visualized is within normal limits. Lungs/Pleura: Lungs are well aerated bilaterally. A 5 mm nodule is noted in the medial aspect of the left upper lobe best seen on image number 21 of series 6. This has increased in size from the prior exam sixteen years previous at which time it measured 1-2 mm elevation of the right hemidiaphragm is seen. This is stable in appearance from the prior exam. No other nodules are seen. Small right pleural effusion is noted with right basilar atelectasis. Upper Abdomen: Visualized abdomen shows no acute abnormality. Musculoskeletal: Changes of anasarca are noted peripherally consistent with that described in the lower extremities. Degenerative changes of the thoracic spine are seen. No acute compression deformity is noted. Review of the MIP images confirms the above findings. IMPRESSION: No evidence of pulmonary emboli. Small right pleural effusion with right basilar atelectasis. 5 mm nodule in the left upper lobe increased in size from 16 years ago at which time it measured 1-2 mm. No follow-up needed  if patient is low-risk. Non-contrast chest CT can be considered in 12 months if patient is high-risk. This recommendation follows the consensus statement: Guidelines for Management of Incidental Pulmonary Nodules Detected on CT Images: From the Fleischner Society 2017; Radiology 2017; 284:228-243. Changes of  anasarca. Aortic Atherosclerosis (ICD10-I70.0). Electronically Signed   By: Alcide CleverMark  Lukens M.D.   On: 03/08/2020 20:33   CT ABDOMEN PELVIS W CONTRAST  Result Date: 03/14/2020 CLINICAL DATA:  Lower gastrointestinal bleed. EXAM: CT ABDOMEN AND PELVIS WITH CONTRAST TECHNIQUE: Multidetector CT imaging of the abdomen and pelvis was performed using the standard protocol following bolus administration of intravenous contrast. CONTRAST:  100mL OMNIPAQUE IOHEXOL 300 MG/ML  SOLN COMPARISON:  None. FINDINGS: Lower chest: Small pleural effusions bilaterally, larger on the right. Subpleural compressive atelectasis in both lungs. Right hemidiaphragm elevation. Mild cardiomegaly. Four-vessel moderate to severe coronary calcification most pronounced along the LAD. Pulmonary artery hypertension, the pulmonary trunk measuring 4.6 cm diameter. Tiny paraesophageal hernia. Hepatobiliary: Serpiginous lobular enhancement in the medial right hepatic dome measuring up to 24 mm diameter and isoenhancing with the hepatic vasculature, likely intraparenchymal varices. Fatty infiltration of the hepatic parenchyma. Normal gallbladder and biliary tree. Pancreas: Mild fatty infiltration. No acute pancreatitis. No apparent dilatation of the main pancreatic duct. Spleen: A 12 mm ovoid enhancement within the splenic parenchyma. Adrenals/Urinary Tract: Normal bilateral adrenal glands. Simple appearing bilateral renal cortical cyst measuring up to 2.5 cm on the left and 17 Hounsfield units. No right or left hydronephrosis. Normal bilateral ureters. Symmetrical mild bilateral perinephric inflammatory change, likely sequela of medical renal disease. A nonobstructing 4 mm left renal lower pole calculus. No apparent urinary bladder abnormality. Stomach/Bowel: Large stool burden without bowel obstruction. Normal appendix, coronal series 5, image 32. Diffuse mild circumferential rectal wall thickening with inflammatory change in the perirectal fat planes  and small amount of 5 Hounsfield unit free fluid in the pre sacral space. Apparent short segment mild mucosal thickening of a left lower abdominal quadrant jejunal bowel loop, coronal series 5, image 31, and apparent diffuse gastric mucosal thickening which could be due entirely to nondistention by enteric contrast. No apparent colonic wall thickening. Mild sigmoid diverticulosis coli. No apparent colonic wall thickening. Redundant sigmoid colon with adhesion formation to the right upper and lower abdominal walls, but no volvulus. Reflux of stool into the terminal ileum. No convincing evidence for contrast extravasation into the colonic bowel lumen. Vascular/Lymphatic: Aorto bi-iliac calcified atherosclerosis without an abdominal aorta aneurysm. Nonspecific bilateral inguinal, retroperitoneal and mesenteric lymph nodes. Reproductive: Scoliosis and moderate skeletal degenerative changes. Other: Small periumbilical and bilateral inguinal herniations of fat without strangulation or incarceration. Extensive anasarca above and below the waist. Musculoskeletal: Scoliosis and moderate skeletal degenerative changes. Mild diffuse bone demineralization. Grade 1 anterolisthesis of L5 on S1, likely degenerative. IMPRESSION: An acute mild uncomplicated proctitis with small amount of free fluid in the presacral space and edema in the perirectal fat plane without perforation or abscess. Large stool burden throughout the colon. Sigmoid diverticulosis without acute diverticulitis or contrast extravasation within the colonic lumen. Apparent mild mucosal thickening of the gastric lumen and a left lower abdominal quadrant small bowel loop, which could be secondary to incomplete distension or a mild gastroenteritis. Abnormal enhancement in the medial right hepatic dome and in the splenic parenchyma, possibly intrahepatic varices and splenic hemangioma, new or not apparent on the March 08, 2020 and April 18, 2004 CT chest studies. Mild  cardiomegaly with four-vessel moderate to severe coronary calcifications, small bilateral pleural effusions  and bibasilar compressive atelectasis. Extensive anasarca. Nonobstructing small left renal calculus. Aortic calcified atherosclerosis. Pulmonary artery hypertension. Electronically Signed   By: Rachel  Lagos   On: 03/14/2020 18:17   US Venous Img Upper Uni Left (DVT)  Result Date: 03/09/2020 CLINICAL DATA:  Left upper extremity edema.  Evaluate for DVT. EXAM: LEFT UPPER EXTREMITY VENOUS DOPPLER ULTRASOUND TECHNIQUE: Gray-scale sonography with graded compression, as well as color Doppler and duplex ultrasound were performed to evaluate the upper extremity deep venous system from the level of the subclavian vein and including the jugular, axillary, basilic, radial, ulnar and upper cephalic vein. Spectral Doppler was utilized to evaluate flow at rest and with distal augmentation maneuvers. COMPARISON:  None. FINDINGS: Contralateral Subclavian Vein: Respiratory phasicity is normal and symmetric with the symptomatic side. No evidence of thrombus. Normal compressibility. Internal Jugular Vein: No evidence of thrombus. Normal compressibility, respiratory phasicity and response to augmentation. Subclavian Vein: No evidence of thrombus. Normal compressibility, respiratory phasicity and response to augmentation. Axillary Vein: No evidence of thrombus. Normal compressibility, respiratory phasicity and response to augmentation. Cephalic Vein: No evidence of thrombus. Normal compressibility, respiratory phasicity and response to augmentation. Basilic Vein: No evidence of thrombus. Normal compressibility, respiratory phasicity and response to augmentation. Brachial Veins: No evidence of thrombus. Normal compressibility, respiratory phasicity and response to augmentation. Radial Veins: No evidence of thrombus. Normal compressibility, respiratory phasicity and response to augmentation. Ulnar Veins: No evidence of  thrombus. Normal compressibility, respiratory phasicity and response to augmentation. Venous Reflux:  None visualized. Other Findings: There is a moderate amount of subcutaneous edema at the level of the left arm and forearm. IMPRESSION: No evidence of DVT within the left upper extremity. Electronically Signed   By: John  Watts M.D.   On: 03/09/2020 16:16   DG CHEST PORT 1 VIEW  Result Date: 03/13/2020 CLINICAL DATA:  Dyspnea EXAM: PORTABLE CHEST 1 VIEW COMPARISON:  03/08/2020 chest radiograph. FINDINGS: Low lung volumes. Stable cardiomediastinal silhouette with top-normal heart size. No pneumothorax. Stable elevation of the right hemidiaphragm. Blunting of the right costophrenic angle. No left pleural effusion. No overt pulmonary edema. Right basilar hazy opacity. IMPRESSION: Low lung volumes. Stable elevation of the right hemidiaphragm. Hazy right basilar lung opacity, which could represent atelectasis, aspiration and/or pneumonia. New blunting of the right costophrenic angle, cannot exclude small right pleural effusion. Electronically Signed   By: Jason A Poff M.D.   On: 03/13/2020 13:47   DG Chest Portable 1 View  Result Date: 03/08/2020 CLINICAL DATA:  Anasarca EXAM: PORTABLE CHEST 1 VIEW COMPARISON:  2018 FINDINGS: Stable elevation of the right hemidiaphragm. No new consolidation or edema. No significant pleural effusion. Stable cardiomediastinal contours. IMPRESSION: Stable significant elevation of the right hemidiaphragm. No acute finding. Electronically Signed   By: Praneil  Patel M.D.   On: 03/08/2020 16:30   ECHOCARDIOGRAM COMPLETE  Result Date: 03/09/2020    ECHOCARDIOGRAM REPORT   Patient Name:   Treson Enneking Date of Exam: 03/09/2020 Medical Rec #:  [id number]       Height:       66.0 in Accession #:    [id number]      Weight:       237.0 lb Date of Birth:  12/17/1933      BSA:          2.149 m Patient Age:    86 years        BP:           106/83 mmHg Patient Gender: M                 HR:            97 bpm. Exam Location:  [facility] Procedure: 2D Echo Indications:    Swelling of both lower extremities  History:        Patient has no prior history of Echocardiogram examinations.                 Arrythmias:Atrial Fibrillation; Risk Factors:Former Smoker and                 Hypertension. Cellulitis.  Sonographer:    Leavy Cella RDCS (AE) Referring Phys: [place]  1. Left ventricular ejection fraction, by estimation, is 60 to 65%. The left ventricle has normal function. The left ventricle has no regional wall motion abnormalities. There is mild concentric left ventricular hypertrophy. Left ventricular diastolic parameters were normal. There is the interventricular septum is flattened in systole, consistent with right ventricular pressure overload.  2. Right ventricular systolic function is mildly reduced. The right ventricular size is mildly enlarged. There is moderately elevated pulmonary artery systolic pressure.  3. Right atrial size was mildly dilated.  4. The mitral valve is degenerative. Mild mitral valve regurgitation.  5. Tricuspid valve regurgitation is moderate.  6. The aortic valve is tricuspid. Aortic valve regurgitation is not visualized. Mild aortic valve sclerosis is present, with no evidence of aortic valve stenosis.  7. The inferior vena cava is dilated in size with <50% respiratory variability, suggesting right atrial pressure of 15 mmHg. FINDINGS  Left Ventricle: Left ventricular ejection fraction, by estimation, is 60 to 65%. The left ventricle has normal function. The left ventricle has no regional wall motion abnormalities. The left ventricular internal cavity size was normal in size. There is  mild concentric left ventricular hypertrophy. The interventricular septum is flattened in systole, consistent with right ventricular pressure overload. Left ventricular diastolic parameters were normal. Right Ventricle: The right ventricular size is mildly  enlarged. No increase in right ventricular wall thickness. Right ventricular systolic function is mildly reduced. There is moderately elevated pulmonary artery systolic pressure. The tricuspid regurgitant velocity is 2.96 m/s, and with an assumed right atrial pressure of 15 mmHg, the estimated right ventricular systolic pressure is 16.1 mmHg. Left Atrium: Left atrial size was normal in size. Right Atrium: Right atrial size was mildly dilated. Pericardium: There is no evidence of pericardial effusion. Mitral Valve: The mitral valve is degenerative in appearance. There is mild thickening of the mitral valve leaflet(s). Moderate mitral annular calcification. Mild mitral valve regurgitation. Tricuspid Valve: The tricuspid valve is grossly normal. Tricuspid valve regurgitation is moderate. Aortic Valve: The aortic valve is tricuspid. . There is mild thickening and mild calcification of the aortic valve. Aortic valve regurgitation is not visualized. Mild aortic valve sclerosis is present, with no evidence of aortic valve stenosis. Mild to moderate aortic valve annular calcification. There is mild thickening of the aortic valve. There is mild calcification of the aortic valve. Pulmonic Valve: The pulmonic valve was grossly normal. Pulmonic valve regurgitation is mild. Aorta: The aortic root is normal in size and structure. Venous: The inferior vena cava is dilated in size with less than 50% respiratory variability, suggesting right atrial pressure of 15 mmHg. IAS/Shunts: The interatrial septum was not well visualized.  LEFT VENTRICLE PLAX 2D LVIDd:         4.19 cm  Diastology LVIDs:         2.49 cm  LV e' lateral:   13.60 cm/s LV PW:  1.29 cm  LV E/e' lateral: 8.0 LV IVS:        1.12 cm  LV e' medial:    10.10 cm/s LVOT diam:     2.00 cm  LV E/e' medial:  10.8 LVOT Area:     3.14 cm  RIGHT VENTRICLE TAPSE (M-mode): 1.3 cm LEFT ATRIUM             Index       RIGHT ATRIUM           Index LA diam:        5.00 cm 2.33  cm/m  RA Area:     22.40 cm LA Vol (A2C):   48.2 ml 22.43 ml/m RA Volume:   63.50 ml  29.54 ml/m LA Vol (A4C):   44.5 ml 20.70 ml/m LA Biplane Vol: 46.6 ml 21.68 ml/m   AORTA Ao Root diam: 2.90 cm MITRAL VALVE                TRICUSPID VALVE MV Area (PHT): 3.58 cm     TR Peak grad:   35.0 mmHg MV Decel Time: 212 msec     TR Vmax:        296.00 cm/s MR Peak grad: 46.2 mmHg MR Mean grad: 25.0 mmHg     SHUNTS MR Vmax:      340.00 cm/s   Systemic Diam: 2.00 cm MR Vmean:     236.0 cm/s MV E velocity: 108.67 cm/s Prentice Docker MD Electronically signed by Prentice Docker MD Signature Date/Time: 03/09/2020/12:59:08 PM    Final   [2 weeks]   Assessment: Pleasant 86 y/o male with Afib on Eliquis, recently discharged to [facility] after admission this past week for anasarca suspected to be secondary to HFpEF/CHF and cellulitis of left arm treated with Levaquin and discharged on Augmentin. He presented back to ED same day of discharge due to rectal bleeding. CT in ED with large stool burden, acute mild uncomplicated proctitis with small amt of free fluid in presacral space and edema in perirectal fat plane without perforation or abscess.   Rectal Bleeding: acute on chronic rectal bleeding. No prior colonoscopy. Intermittent constipation. Proctitis noted on CT. Last dose of Eliquis on 03/14/20. Hemoglobin remains normal at 15.3.Platelets at 83,000 (gradual decline from 143000 on 03/08/20).  Constipation: taking miralax prn at home, not well managed. Started on Linzess daily this admission.  Dysphagia: to pills. Prior evaluation with speech therapy during last admission with bedside swallow eval. Oral phase is prolonged and inefficient with solids. Suspected an element of esophageal dysphagia given very slow intake of po and occasional eructation. Recommended D2 and thin liquid diet with feeder assist.   Early Satiety: associated with poor appetite for several months. CT with apparent mild mucosal  thickening of the gastric lumen and a left lower abdominal quadrant small bowel loop which could be secondary to incomplete distention or mild gastroenteritis.  Patient does not have symptoms of gastroenteritis.  Suspect thickening may be related to incomplete distention; however, with early satiety, I feel it would likely worthwhile to evaluate this with EGD.    Abnormal CT of liver/spleen: abnormal enhancement in medial right hepatic dome and splenic parenchyma, possibly intrahepatic varices and splenic hemangioma, not apparent on 03/08/20 CT chest.He had thrombocytopenia. Assess for varices/evidence of portal htn at time of EGD.   Plan: 1. Continue to hold Eliquis. 2. EGD with possible dilation/Flex sig 3/18  Leanna Battles. Dixon [place] [facility] [phone] 3/17/20219:26 AM  LOS: 2 days

## 2020-03-16 NOTE — Progress Notes (Signed)
Subjective:  No complaints. Last BM yesterday. He is not sure if there was blood. None reported.   Objective: Vital signs in last 24 hours: Temp:  [98 F (36.7 C)-98.8 F (37.1 C)] 98.7 F (37.1 C) (03/17 0527) Pulse Rate:  [75-96] 75 (03/17 0527) Resp:  [16-18] 16 (03/17 0527) BP: (104-112)/(71-80) 104/80 (03/17 0527) SpO2:  [95 %-97 %] 95 % (03/17 0754) Last BM Date: 03/15/20 General:   Alert,  Well-developed, well-nourished, pleasant and cooperative in NAD Head:  Normocephalic and atraumatic. Eyes:  Sclera clear, no icterus.  Abdomen:  Soft, nontender and nondistended. Normal bowel sounds, without guarding, and without rebound.   Neurologic:  Alert and  oriented x4;  grossly normal neurologically. Psych:  Alert and cooperative. Normal mood and affect.  Intake/Output from previous day: 03/16 0701 - 03/17 0700 In: 600 [P.O.:600] Out: 851 [Urine:850; Stool:1] Intake/Output this shift: No intake/output data recorded.  Lab Results: CBC Recent Labs    03/14/20 1539 03/15/20 0553 03/16/20 0607  WBC 19.4* 14.9* 11.8*  HGB 16.7 16.2 15.3  HCT 52.7* 51.3 49.5  MCV 101.3* 101.8* 102.7*  PLT 87* 77* 83*   BMET Recent Labs    03/14/20 1539  NA 140  K 3.9  CL 94*  CO2 39*  GLUCOSE 114*  BUN 20  CREATININE 0.74  CALCIUM 8.3*   LFTs Recent Labs    03/14/20 1539  BILITOT 2.8*  ALKPHOS 74  AST 23  ALT 21  PROT 5.7*  ALBUMIN 2.7*   No results for input(s): LIPASE in the last 72 hours. PT/INR Recent Labs    03/14/20 1539  LABPROT 18.5*  INR 1.6*      Imaging Studies: CT ANGIO CHEST PE W OR WO CONTRAST  Result Date: 03/08/2020 CLINICAL DATA:  Hypoxia and peripheral edema EXAM: CT ANGIOGRAPHY CHEST WITH CONTRAST TECHNIQUE: Multidetector CT imaging of the chest was performed using the standard protocol during bolus administration of intravenous contrast. Multiplanar CT image reconstructions and MIPs were obtained to evaluate the vascular anatomy. CONTRAST:   OMNIPAQUE IOHEXOL 350 MG/ML SOLN COMPARISON:  Chest x-ray from earlier in the same day, CT from 04/18/2004. FINDINGS: Cardiovascular: Thoracic aorta and its branches demonstrate atherosclerotic calcifications. No aneurysmal dilatation or dissection is seen. Coronary calcifications are noted. No significant cardiac enlargement is seen. The pulmonary artery shows a normal branching pattern without intraluminal filling defect to suggest pulmonary embolism. Mediastinum/Nodes: Thoracic inlet is within normal limits. No hilar or mediastinal adenopathy is noted. The esophagus as visualized is within normal limits. Lungs/Pleura: Lungs are well aerated bilaterally. A 5 mm nodule is noted in the medial aspect of the left upper lobe best seen on image number 21 of series 6. This has increased in size from the prior exam sixteen years previous at which time it measured 1-2 mm elevation of the right hemidiaphragm is seen. This is stable in appearance from the prior exam. No other nodules are seen. Small right pleural effusion is noted with right basilar atelectasis. Upper Abdomen: Visualized abdomen shows no acute abnormality. Musculoskeletal: Changes of anasarca are noted peripherally consistent with that described in the lower extremities. Degenerative changes of the thoracic spine are seen. No acute compression deformity is noted. Review of the MIP images confirms the above findings. IMPRESSION: No evidence of pulmonary emboli. Small right pleural effusion with right basilar atelectasis. 5 mm nodule in the left upper lobe increased in size from 16 years ago at which time it measured 1-2 mm. No follow-up needed  if patient is low-risk. Non-contrast chest CT can be considered in 12 months if patient is high-risk. This recommendation follows the consensus statement: Guidelines for Management of Incidental Pulmonary Nodules Detected on CT Images: From the Fleischner Society 2017; Radiology 2017; 284:228-243. Changes of  anasarca. Aortic Atherosclerosis (ICD10-I70.0). Electronically Signed   By: Alcide CleverMark  Lukens M.D.   On: 03/08/2020 20:33   CT ABDOMEN PELVIS W CONTRAST  Result Date: 03/14/2020 CLINICAL DATA:  Lower gastrointestinal bleed. EXAM: CT ABDOMEN AND PELVIS WITH CONTRAST TECHNIQUE: Multidetector CT imaging of the abdomen and pelvis was performed using the standard protocol following bolus administration of intravenous contrast. CONTRAST:  100mL OMNIPAQUE IOHEXOL 300 MG/ML  SOLN COMPARISON:  None. FINDINGS: Lower chest: Small pleural effusions bilaterally, larger on the right. Subpleural compressive atelectasis in both lungs. Right hemidiaphragm elevation. Mild cardiomegaly. Four-vessel moderate to severe coronary calcification most pronounced along the LAD. Pulmonary artery hypertension, the pulmonary trunk measuring 4.6 cm diameter. Tiny paraesophageal hernia. Hepatobiliary: Serpiginous lobular enhancement in the medial right hepatic dome measuring up to 24 mm diameter and isoenhancing with the hepatic vasculature, likely intraparenchymal varices. Fatty infiltration of the hepatic parenchyma. Normal gallbladder and biliary tree. Pancreas: Mild fatty infiltration. No acute pancreatitis. No apparent dilatation of the main pancreatic duct. Spleen: A 12 mm ovoid enhancement within the splenic parenchyma. Adrenals/Urinary Tract: Normal bilateral adrenal glands. Simple appearing bilateral renal cortical cyst measuring up to 2.5 cm on the left and 17 Hounsfield units. No right or left hydronephrosis. Normal bilateral ureters. Symmetrical mild bilateral perinephric inflammatory change, likely sequela of medical renal disease. A nonobstructing 4 mm left renal lower pole calculus. No apparent urinary bladder abnormality. Stomach/Bowel: Large stool burden without bowel obstruction. Normal appendix, coronal series 5, image 32. Diffuse mild circumferential rectal wall thickening with inflammatory change in the perirectal fat planes  and small amount of 5 Hounsfield unit free fluid in the pre sacral space. Apparent short segment mild mucosal thickening of a left lower abdominal quadrant jejunal bowel loop, coronal series 5, image 31, and apparent diffuse gastric mucosal thickening which could be due entirely to nondistention by enteric contrast. No apparent colonic wall thickening. Mild sigmoid diverticulosis coli. No apparent colonic wall thickening. Redundant sigmoid colon with adhesion formation to the right upper and lower abdominal walls, but no volvulus. Reflux of stool into the terminal ileum. No convincing evidence for contrast extravasation into the colonic bowel lumen. Vascular/Lymphatic: Aorto bi-iliac calcified atherosclerosis without an abdominal aorta aneurysm. Nonspecific bilateral inguinal, retroperitoneal and mesenteric lymph nodes. Reproductive: Scoliosis and moderate skeletal degenerative changes. Other: Small periumbilical and bilateral inguinal herniations of fat without strangulation or incarceration. Extensive anasarca above and below the waist. Musculoskeletal: Scoliosis and moderate skeletal degenerative changes. Mild diffuse bone demineralization. Grade 1 anterolisthesis of L5 on S1, likely degenerative. IMPRESSION: An acute mild uncomplicated proctitis with small amount of free fluid in the presacral space and edema in the perirectal fat plane without perforation or abscess. Large stool burden throughout the colon. Sigmoid diverticulosis without acute diverticulitis or contrast extravasation within the colonic lumen. Apparent mild mucosal thickening of the gastric lumen and a left lower abdominal quadrant small bowel loop, which could be secondary to incomplete distension or a mild gastroenteritis. Abnormal enhancement in the medial right hepatic dome and in the splenic parenchyma, possibly intrahepatic varices and splenic hemangioma, new or not apparent on the March 08, 2020 and April 18, 2004 CT chest studies. Mild  cardiomegaly with four-vessel moderate to severe coronary calcifications, small bilateral pleural effusions  and bibasilar compressive atelectasis. Extensive anasarca. Nonobstructing small left renal calculus. Aortic calcified atherosclerosis. Pulmonary artery hypertension. Electronically Signed   By: Laurence Ferrari   On: 03/14/2020 18:17   US Venous Img Upper Uni Left (DVT)  Result Date: 03/09/2020 CLINICAL DATA:  Left upper extremity edema.  Evaluate for DVT. EXAM: LEFT UPPER EXTREMITY VENOUS DOPPLER ULTRASOUND TECHNIQUE: Gray-scale sonography with graded compression, as well as color Doppler and duplex ultrasound were performed to evaluate the upper extremity deep venous system from the level of the subclavian vein and including the jugular, axillary, basilic, radial, ulnar and upper cephalic vein. Spectral Doppler was utilized to evaluate flow at rest and with distal augmentation maneuvers. COMPARISON:  None. FINDINGS: Contralateral Subclavian Vein: Respiratory phasicity is normal and symmetric with the symptomatic side. No evidence of thrombus. Normal compressibility. Internal Jugular Vein: No evidence of thrombus. Normal compressibility, respiratory phasicity and response to augmentation. Subclavian Vein: No evidence of thrombus. Normal compressibility, respiratory phasicity and response to augmentation. Axillary Vein: No evidence of thrombus. Normal compressibility, respiratory phasicity and response to augmentation. Cephalic Vein: No evidence of thrombus. Normal compressibility, respiratory phasicity and response to augmentation. Basilic Vein: No evidence of thrombus. Normal compressibility, respiratory phasicity and response to augmentation. Brachial Veins: No evidence of thrombus. Normal compressibility, respiratory phasicity and response to augmentation. Radial Veins: No evidence of thrombus. Normal compressibility, respiratory phasicity and response to augmentation. Ulnar Veins: No evidence of  thrombus. Normal compressibility, respiratory phasicity and response to augmentation. Venous Reflux:  None visualized. Other Findings: There is a moderate amount of subcutaneous edema at the level of the left arm and forearm. IMPRESSION: No evidence of DVT within the left upper extremity. Electronically Signed   By: Simonne Come M.D.   On: 03/09/2020 16:16   DG CHEST PORT 1 VIEW  Result Date: 03/13/2020 CLINICAL DATA:  Dyspnea EXAM: PORTABLE CHEST 1 VIEW COMPARISON:  03/08/2020 chest radiograph. FINDINGS: Low lung volumes. Stable cardiomediastinal silhouette with top-normal heart size. No pneumothorax. Stable elevation of the right hemidiaphragm. Blunting of the right costophrenic angle. No left pleural effusion. No overt pulmonary edema. Right basilar hazy opacity. IMPRESSION: Low lung volumes. Stable elevation of the right hemidiaphragm. Hazy right basilar lung opacity, which could represent atelectasis, aspiration and/or pneumonia. New blunting of the right costophrenic angle, cannot exclude small right pleural effusion. Electronically Signed   By: Delbert Phenix M.D.   On: 03/13/2020 13:47   DG Chest Portable 1 View  Result Date: 03/08/2020 CLINICAL DATA:  Anasarca EXAM: PORTABLE CHEST 1 VIEW COMPARISON:  2018 FINDINGS: Stable elevation of the right hemidiaphragm. No new consolidation or edema. No significant pleural effusion. Stable cardiomediastinal contours. IMPRESSION: Stable significant elevation of the right hemidiaphragm. No acute finding. Electronically Signed   By: Guadlupe Spanish M.D.   On: 03/08/2020 16:30   ECHOCARDIOGRAM COMPLETE  Result Date: 03/09/2020    ECHOCARDIOGRAM REPORT   Patient Name:   Jesus Fowler Date of Exam: 03/09/2020 Medical Rec #:  [id number]       Height:       66.0 in Accession #:    [id number]      Weight:       237.0 lb Date of Birth:  Dec 09, 1933      BSA:          2.149 m Patient Age:    86 years        BP:           106/83 mmHg Patient Gender: M  HR:            97 bpm. Exam Location:  [facility] Procedure: 2D Echo Indications:    Swelling of both lower extremities  History:        Patient has no prior history of Echocardiogram examinations.                 Arrythmias:Atrial Fibrillation; Risk Factors:Former Smoker and                 Hypertension. Cellulitis.  Sonographer:    Leavy Cella RDCS (AE) Referring Phys: [place]  1. Left ventricular ejection fraction, by estimation, is 60 to 65%. The left ventricle has normal function. The left ventricle has no regional wall motion abnormalities. There is mild concentric left ventricular hypertrophy. Left ventricular diastolic parameters were normal. There is the interventricular septum is flattened in systole, consistent with right ventricular pressure overload.  2. Right ventricular systolic function is mildly reduced. The right ventricular size is mildly enlarged. There is moderately elevated pulmonary artery systolic pressure.  3. Right atrial size was mildly dilated.  4. The mitral valve is degenerative. Mild mitral valve regurgitation.  5. Tricuspid valve regurgitation is moderate.  6. The aortic valve is tricuspid. Aortic valve regurgitation is not visualized. Mild aortic valve sclerosis is present, with no evidence of aortic valve stenosis.  7. The inferior vena cava is dilated in size with <50% respiratory variability, suggesting right atrial pressure of 15 mmHg. FINDINGS  Left Ventricle: Left ventricular ejection fraction, by estimation, is 60 to 65%. The left ventricle has normal function. The left ventricle has no regional wall motion abnormalities. The left ventricular internal cavity size was normal in size. There is  mild concentric left ventricular hypertrophy. The interventricular septum is flattened in systole, consistent with right ventricular pressure overload. Left ventricular diastolic parameters were normal. Right Ventricle: The right ventricular size is mildly  enlarged. No increase in right ventricular wall thickness. Right ventricular systolic function is mildly reduced. There is moderately elevated pulmonary artery systolic pressure. The tricuspid regurgitant velocity is 2.96 m/s, and with an assumed right atrial pressure of 15 mmHg, the estimated right ventricular systolic pressure is 16.1 mmHg. Left Atrium: Left atrial size was normal in size. Right Atrium: Right atrial size was mildly dilated. Pericardium: There is no evidence of pericardial effusion. Mitral Valve: The mitral valve is degenerative in appearance. There is mild thickening of the mitral valve leaflet(s). Moderate mitral annular calcification. Mild mitral valve regurgitation. Tricuspid Valve: The tricuspid valve is grossly normal. Tricuspid valve regurgitation is moderate. Aortic Valve: The aortic valve is tricuspid. . There is mild thickening and mild calcification of the aortic valve. Aortic valve regurgitation is not visualized. Mild aortic valve sclerosis is present, with no evidence of aortic valve stenosis. Mild to moderate aortic valve annular calcification. There is mild thickening of the aortic valve. There is mild calcification of the aortic valve. Pulmonic Valve: The pulmonic valve was grossly normal. Pulmonic valve regurgitation is mild. Aorta: The aortic root is normal in size and structure. Venous: The inferior vena cava is dilated in size with less than 50% respiratory variability, suggesting right atrial pressure of 15 mmHg. IAS/Shunts: The interatrial septum was not well visualized.  LEFT VENTRICLE PLAX 2D LVIDd:         4.19 cm  Diastology LVIDs:         2.49 cm  LV e' lateral:   13.60 cm/s LV PW:  1.29 cm  LV E/e' lateral: 8.0 LV IVS:        1.12 cm  LV e' medial:    10.10 cm/s LVOT diam:     2.00 cm  LV E/e' medial:  10.8 LVOT Area:     3.14 cm  RIGHT VENTRICLE TAPSE (M-mode): 1.3 cm LEFT ATRIUM             Index       RIGHT ATRIUM           Index LA diam:        5.00 cm 2.33  cm/m  RA Area:     22.40 cm LA Vol (A2C):   48.2 ml 22.43 ml/m RA Volume:   63.50 ml  29.54 ml/m LA Vol (A4C):   44.5 ml 20.70 ml/m LA Biplane Vol: 46.6 ml 21.68 ml/m   AORTA Ao Root diam: 2.90 cm MITRAL VALVE                TRICUSPID VALVE MV Area (PHT): 3.58 cm     TR Peak grad:   35.0 mmHg MV Decel Time: 212 msec     TR Vmax:        296.00 cm/s MR Peak grad: 46.2 mmHg MR Mean grad: 25.0 mmHg     SHUNTS MR Vmax:      340.00 cm/s   Systemic Diam: 2.00 cm MR Vmean:     236.0 cm/s MV E velocity: 108.67 cm/s Prentice Docker MD Electronically signed by Prentice Docker MD Signature Date/Time: 03/09/2020/12:59:08 PM    Final   [2 weeks]   Assessment: Pleasant 86 y/o male with Afib on Eliquis, recently discharged to [facility] after admission this past week for anasarca suspected to be secondary to HFpEF/CHF and cellulitis of left arm treated with Levaquin and discharged on Augmentin. He presented back to ED same day of discharge due to rectal bleeding. CT in ED with large stool burden, acute mild uncomplicated proctitis with small amt of free fluid in presacral space and edema in perirectal fat plane without perforation or abscess.   Rectal Bleeding: acute on chronic rectal bleeding. No prior colonoscopy. Intermittent constipation. Proctitis noted on CT. Last dose of Eliquis on 03/14/20. Hemoglobin remains normal at 15.3.Platelets at 83,000 (gradual decline from 143000 on 03/08/20).  Constipation: taking miralax prn at home, not well managed. Started on Linzess daily this admission.  Dysphagia: to pills. Prior evaluation with speech therapy during last admission with bedside swallow eval. Oral phase is prolonged and inefficient with solids. Suspected an element of esophageal dysphagia given very slow intake of po and occasional eructation. Recommended D2 and thin liquid diet with feeder assist.   Early Satiety: associated with poor appetite for several months. CT with apparent mild mucosal  thickening of the gastric lumen and a left lower abdominal quadrant small bowel loop which could be secondary to incomplete distention or mild gastroenteritis.  Patient does not have symptoms of gastroenteritis.  Suspect thickening may be related to incomplete distention; however, with early satiety, I feel it would likely worthwhile to evaluate this with EGD.    Abnormal CT of liver/spleen: abnormal enhancement in medial right hepatic dome and splenic parenchyma, possibly intrahepatic varices and splenic hemangioma, not apparent on 03/08/20 CT chest.He had thrombocytopenia. Assess for varices/evidence of portal htn at time of EGD.   Plan: 1. Continue to hold Eliquis. 2. EGD with possible dilation/Flex sig 3/18  Leanna Battles. Dixon [facility] [phone] 3/17/20219:26 AM  LOS: 2 days

## 2020-03-16 NOTE — Progress Notes (Signed)
Patient Demographics:    Jesus Fowler, is a 86 y.o. male, DOB - 1933-10-21, ZOX:[id number]  Admit date - 03/14/2020   Admitting Physician Jacques NavyMichael E Norins, MD  Outpatient Primary MD for the patient is Jesus Fowler, Richard, MD  LOS - 2   Chief Complaint  Patient presents with  . GI Bleeding        Subjective:    Jesus Fowler today has no fevers, no emesis,  No chest pain,   --No significant further rectal bleeding -  Assessment  & Plan :    Active Problems:   Atrial fibrillation, chronic (HCC)   Acute on chronic heart failure with preserved ejection fraction (HFpEF) /Diastolic Dysfunction   Swelling of both lower extremities   OSA on CPAP   Hematochezia   Proctitis   Rectal bleeding   Dysphagia   Early satiety   Brief Summary -86 y.o.malewith medical history significant foratrial fibrillation, hypertension readmitted on 03/14/2020 after being discharged same date to SNF rehab with episode of rectal bleeding -Rectal bleeding this is a new finding and new diagnosis for patient (was not present during recent hospitalization)   A/p  1)Rectal bleeding/painless hematochezia --- GI input appreciated H&H is stable, -CT abdomen and pelvis suggest diverticulosis of the sigmoid colon -Plan is for flexible sigmoidoscopy possibly on 03/17/2020, after Eliquis "washout" , last dose of Eliquis was 03/14/2020 -Hemoglobin is 15.3, no further significant rectal bleeding - 2)Acute Hypoxic Respiratory Failure--- suspect acute on chronic HFpEF/CHF related, BNP is 280,  --Patient is a reformed smoker so cannot exclude some component of COPD -CTA chest w/o acute PE, small right pleural effusion and right-sided atelectasis noted --... Currently requiring 2 L of oxygen via nasal cannula -PTA patient was not on home O2 -Echo with EF of 60 to 65% -Recently aggressively diuresed with IV Lasix --weight is  down to 218pounds from 236.99 -Fluid balance is negative -Continue p.o. torsemide -Continue CPAP with sleep  3)Left upper extremity cellulitis----WBC is down to11.778from 21.1, -- PTA patient was treated with Levaquin as outpatient, no fevers, treated with IV Rocephin, Blood Cx NGTD -Left upper extremity venous Dopplers without acute DVT Okay to complete p.o. Augmentin    4) chronic Atrial fibrillation- rates 80s to 90s. Rate controlled and anticoagulated. Last dose of Eliquis was 03/14/2020  -Continue to hold Eliquis due to GI bleed to allow for endoluminal evaluation We already decreased Toprol-XL to 12.5 mg daily from 50 mg daily due to soft BP for rate control  5)Prolonged QTC-457 --- Keep potassium close to 4 magnesium above 2  6)Elevated bilirubin-T bil 2.7 to 2.9 (indirect 1.9, direct 0.8), --LFTs WNL -Asymptomatic at this time outpatient follow-up advised.  7)Hypertension--- BP soft despite holding lisinopril/HCTZ and amlodipine, decreased Toprol-XL to 12.5 mg daily from 50 mg daily  8)BPH--- continue Flomax  9)Dysphagia-- Speech eval appreciated, recommends---Dysphagia 2 (Fine chop);Thin liquid, Pt benefits from liquid wash during PO consumption. -Plan is for EGD with possible dilatation on 03/17/2020 during flexible sigmoidoscopy procedure  10)Generalized Weakness and Deconditioning-TSH 0.4, -anticipate discharge back to SNF  Disposition--anticipate discharge to SNF for rehab after further endoluminal evaluation with flexible sigmoidoscopy and EGD on 03/17/2020 after Eliquis "washout" period Given GI bleed and swallowing concerns -Not medically ready for discharge  Code Status:Full  Family Communication: (patient is alert, awake and coherent) Discussed withson who is visiting from [place] and wife   Consults :GI DVT Prophylaxis: - SCDs-=-GI bleed   Lab Results  Component Value Date   PLT 83 (L) 03/16/2020    Inpatient  Medications  Scheduled Meds: . amoxicillin-clavulanate  1 tablet Oral BID  . chlorhexidine  15 mL Mouth Rinse BID  . erythromycin   Both Eyes TID  . feeding supplement (ENSURE ENLIVE)  237 mL Oral BID BM  . linaclotide  145 mcg Oral QAC breakfast  . mouth rinse  15 mL Mouth Rinse q12n4p  . metoprolol succinate  12.5 mg Oral Daily  . omega-3 acid ethyl esters  2 g Oral Daily  . potassium chloride  10 mEq Oral Daily  . rosuvastatin  10 mg Oral q1800  . sodium chloride  2 spray Each Nare TID  . [START ON 03/17/2020] sodium phosphate  1 enema Rectal Once  . [START ON 03/17/2020] sodium phosphate  1 enema Rectal Once  . tamsulosin  0.4 mg Oral Daily  . torsemide  20 mg Oral Daily  . vitamin E  800 Units Oral Daily   Continuous Infusions: . sodium chloride     PRN Meds:.acetaminophen, albuterol, hydrOXYzine, polyethylene glycol    Anti-infectives (From admission, onward)   Start     Dose/Rate Route Frequency Ordered Stop   03/14/20 2200  amoxicillin-clavulanate (AUGMENTIN) 875-125 MG per tablet 1 tablet     1 tablet Oral 2 times daily 03/14/20 2143          Objective:   Vitals:   03/16/20 0527 03/16/20 0754 03/16/20 0900 03/16/20 1458  BP: 104/80   110/66  Pulse: 75   89  Resp: 16   18  Temp: 98.7 F (37.1 C)   (!) 97.4 F (36.3 C)  TempSrc: Oral   Oral  SpO2: 97% 95% 95% 97%  Weight:      Height:        Wt Readings from Last 3 Encounters:  03/15/20 99.1 kg  03/14/20 100.2 kg  03/14/20 100.2 kg     Intake/Output Summary (Last 24 hours) at 03/16/2020 1855 Last data filed at 03/16/2020 1100 Gross per 24 hour  Intake --  Output 1550 ml  Net -1550 ml     Physical Exam Gen:- Awake Alert, In no apparent distress  HEENT:- [place].AT, No sclera icterus Eye--much improved conjunctivae findings  Nose -2L/min Neck-Supple Neck,No JVD,.  Lungs-improving air movement, no wheezing  CV- S1, S2 normal, irregularly irregular  abd- +ve B.Sounds, Abd Soft, No tenderness,   Extremity/Skin:- pedal pulses present, left upper extremity without significant warmth, swelling or tenderness, bruising/ecchymosis persistno open draining wounds, chronic lower extremity edema, chronic severe venous stasis dermatitis type changes of both lower extremities Psych-affect is appropriate, oriented x3 Neuro-generalized weakness and deconditioning, no new focal deficits, no tremors   Data Review:   Micro Results Recent Results (from the past 240 hour(s))  SARS CORONAVIRUS 2 (TAT 6-24 HRS) Nasopharyngeal Nasopharyngeal Swab     Status: None   Collection Time: 03/08/20 11:30 PM   Specimen: Nasopharyngeal Swab  Result Value Ref Range Status   SARS Coronavirus 2 NEGATIVE NEGATIVE Final    Comment: (NOTE) SARS-CoV-2 target nucleic acids are NOT DETECTED. The SARS-CoV-2 RNA is generally detectable in upper and lower respiratory specimens during the acute phase of infection. Negative results do not preclude SARS-CoV-2 infection, do not rule out co-infections with other pathogens, and should  not be used as the sole basis for treatment or other patient management decisions. Negative results must be combined with clinical observations, patient history, and epidemiological information. The expected result is Negative. Fact Sheet for Patients: SugarRoll.be Fact Sheet for Healthcare Providers: https://www.woods-mathews.com/ This test is not yet approved or cleared by the [place] FDA and  has been authorized for detection and/or diagnosis of SARS-CoV-2 by FDA under an Emergency Use Authorization (EUA). This EUA will remain  in effect (meaning this test can be used) for the duration of the COVID-19 declaration under Section 56 4(b)(1) of the Act, 21 U.S.C. section 360bbb-3(b)(1), unless the authorization is terminated or revoked sooner. Performed at [facility], [place] [address], [place], [place] [address]   Culture, blood  (Routine X 2) w Reflex to ID Panel     Status: None   Collection Time: 03/09/20  6:25 PM   Specimen: Right Antecubital; Blood  Result Value Ref Range Status   Specimen Description RIGHT ANTECUBITAL  Final   Special Requests   Final    BOTTLES DRAWN AEROBIC AND ANAEROBIC Blood Culture adequate volume   Culture   Final    NO GROWTH 5 DAYS Performed at [facility], [address], [place], [place] [address]    Report Status 03/14/2020 FINAL  Final  Culture, blood (Routine X 2) w Reflex to ID Panel     Status: None   Collection Time: 03/09/20  6:25 PM   Specimen: BLOOD RIGHT HAND  Result Value Ref Range Status   Specimen Description BLOOD RIGHT HAND  Final   Special Requests   Final    BOTTLES DRAWN AEROBIC ONLY Blood Culture adequate volume   Culture   Final    NO GROWTH 5 DAYS Performed at [facility], [address], [place], [place] [address]    Report Status 03/14/2020 FINAL  Final  SARS CORONAVIRUS 2 (TAT 6-24 HRS) Nasopharyngeal Nasopharyngeal Swab     Status: None   Collection Time: 03/11/20  5:57 PM   Specimen: Nasopharyngeal Swab  Result Value Ref Range Status   SARS Coronavirus 2 NEGATIVE NEGATIVE Final    Comment: (NOTE) SARS-CoV-2 target nucleic acids are NOT DETECTED. The SARS-CoV-2 RNA is generally detectable in upper and lower respiratory specimens during the acute phase of infection. Negative results do not preclude SARS-CoV-2 infection, do not rule out co-infections with other pathogens, and should not be used as the sole basis for treatment or other patient management decisions. Negative results must be combined with clinical observations, patient history, and epidemiological information. The expected result is Negative. Fact Sheet for Patients: SugarRoll.be Fact Sheet for Healthcare Providers: https://www.woods-mathews.com/ This test is not yet approved or cleared by the [place] FDA and  has been authorized for  detection and/or diagnosis of SARS-CoV-2 by FDA under an Emergency Use Authorization (EUA). This EUA will remain  in effect (meaning this test can be used) for the duration of the COVID-19 declaration under Section 56 4(b)(1) of the Act, 21 U.S.C. section 360bbb-3(b)(1), unless the authorization is terminated or revoked sooner. Performed at [facility], [place] [address] [place], [place] [address]   SARS CORONAVIRUS 2 (TAT 6-24 HRS) Nasopharyngeal Nasopharyngeal Swab     Status: None   Collection Time: 03/14/20  5:00 AM   Specimen: Nasopharyngeal Swab  Result Value Ref Range Status   SARS Coronavirus 2 NEGATIVE NEGATIVE Final    Comment: (NOTE) SARS-CoV-2 target nucleic acids are NOT DETECTED. The SARS-CoV-2 RNA  is generally detectable in upper and lower respiratory specimens during the acute phase of infection. Negative results do not preclude SARS-CoV-2 infection, do not rule out co-infections with other pathogens, and should not be used as the sole basis for treatment or other patient management decisions. Negative results must be combined with clinical observations, patient history, and epidemiological information. The expected result is Negative. Fact Sheet for Patients: HairSlick.no Fact Sheet for Healthcare Providers: quierodirigir.com This test is not yet approved or cleared by the [place] FDA and  has been authorized for detection and/or diagnosis of SARS-CoV-2 by FDA under an Emergency Use Authorization (EUA). This EUA will remain  in effect (meaning this test can be used) for the duration of the COVID-19 declaration under Section 56 4(b)(1) of the Act, 21 U.S.C. section 360bbb-3(b)(1), unless the authorization is terminated or revoked sooner. Performed at [facility], [address], [place], [place] [address]   Respiratory Panel by RT PCR (Flu A&B, Covid) - Nasopharyngeal Swab     Status: None    Collection Time: 03/14/20 11:30 AM   Specimen: Nasopharyngeal Swab  Result Value Ref Range Status   SARS Coronavirus 2 by RT PCR NEGATIVE NEGATIVE Final    Comment: (NOTE) SARS-CoV-2 target nucleic acids are NOT DETECTED. The SARS-CoV-2 RNA is generally detectable in upper respiratoy specimens during the acute phase of infection. The lowest concentration of SARS-CoV-2 viral copies this assay can detect is 131 copies/mL. A negative result does not preclude SARS-Cov-2 infection and should not be used as the sole basis for treatment or other patient management decisions. A negative result may occur with  improper specimen collection/handling, submission of specimen other than nasopharyngeal swab, presence of viral mutation(s) within the areas targeted by this assay, and inadequate number of viral copies (<131 copies/mL). A negative result must be combined with clinical observations, patient history, and epidemiological information. The expected result is Negative. Fact Sheet for Patients:  https://www.moore.com/ Fact Sheet for Healthcare Providers:  https://www.young.biz/ This test is not yet ap proved or cleared by the [place] FDA and  has been authorized for detection and/or diagnosis of SARS-CoV-2 by FDA under an Emergency Use Authorization (EUA). This EUA will remain  in effect (meaning this test can be used) for the duration of the COVID-19 declaration under Section 564(b)(1) of the Act, 21 U.S.C. section 360bbb-3(b)(1), unless the authorization is terminated or revoked sooner.    Influenza A by PCR NEGATIVE NEGATIVE Final   Influenza B by PCR NEGATIVE NEGATIVE Final    Comment: (NOTE) The Xpert Xpress SARS-CoV-2/FLU/RSV assay is intended as an aid in  the diagnosis of influenza from Nasopharyngeal swab specimens and  should not be used as a sole basis for treatment. Nasal washings and  aspirates are unacceptable for Xpert Xpress  SARS-CoV-2/FLU/RSV  testing. Fact Sheet for Patients: https://www.moore.com/ Fact Sheet for Healthcare Providers: https://www.young.biz/ This test is not yet approved or cleared by the [place] FDA and  has been authorized for detection and/or diagnosis of SARS-CoV-2 by  FDA under an Emergency Use Authorization (EUA). This EUA will remain  in effect (meaning this test can be used) for the duration of the  Covid-19 declaration under Section 564(b)(1) of the Act, 21  U.S.C. section 360bbb-3(b)(1), unless the authorization is  terminated or revoked. Performed at [facility], [address], [place], [place] [address]     Radiology Reports CT ANGIO CHEST PE W OR WO CONTRAST  Result Date: 03/08/2020 CLINICAL DATA:  Hypoxia  and peripheral edema EXAM: CT ANGIOGRAPHY CHEST WITH CONTRAST TECHNIQUE: Multidetector CT imaging of the chest was performed using the standard protocol during bolus administration of intravenous contrast. Multiplanar CT image reconstructions and MIPs were obtained to evaluate the vascular anatomy. CONTRAST:  OMNIPAQUE IOHEXOL 350 MG/ML SOLN COMPARISON:  Chest x-ray from earlier in the same day, CT from 04/18/2004. FINDINGS: Cardiovascular: Thoracic aorta and its branches demonstrate atherosclerotic calcifications. No aneurysmal dilatation or dissection is seen. Coronary calcifications are noted. No significant cardiac enlargement is seen. The pulmonary artery shows a normal branching pattern without intraluminal filling defect to suggest pulmonary embolism. Mediastinum/Nodes: Thoracic inlet is within normal limits. No hilar or mediastinal adenopathy is noted. The esophagus as visualized is within normal limits. Lungs/Pleura: Lungs are well aerated bilaterally. A 5 mm nodule is noted in the medial aspect of the left upper lobe best seen on image number 21 of series 6. This has increased in size from the prior exam sixteen years  previous at which time it measured 1-2 mm elevation of the right hemidiaphragm is seen. This is stable in appearance from the prior exam. No other nodules are seen. Small right pleural effusion is noted with right basilar atelectasis. Upper Abdomen: Visualized abdomen shows no acute abnormality. Musculoskeletal: Changes of anasarca are noted peripherally consistent with that described in the lower extremities. Degenerative changes of the thoracic spine are seen. No acute compression deformity is noted. Review of the MIP images confirms the above findings. IMPRESSION: No evidence of pulmonary emboli. Small right pleural effusion with right basilar atelectasis. 5 mm nodule in the left upper lobe increased in size from 16 years ago at which time it measured 1-2 mm. No follow-up needed if patient is low-risk. Non-contrast chest CT can be considered in 12 months if patient is high-risk. This recommendation follows the consensus statement: Guidelines for Management of Incidental Pulmonary Nodules Detected on CT Images: From the Fleischner Society 2017; Radiology 2017; 284:228-243. Changes of anasarca. Aortic Atherosclerosis (ICD10-I70.0). Electronically Signed   By: Alcide Clever M.D.   On: 03/08/2020 20:33   CT ABDOMEN PELVIS W CONTRAST  Result Date: 03/14/2020 CLINICAL DATA:  Lower gastrointestinal bleed. EXAM: CT ABDOMEN AND PELVIS WITH CONTRAST TECHNIQUE: Multidetector CT imaging of the abdomen and pelvis was performed using the standard protocol following bolus administration of intravenous contrast. CONTRAST:  OMNIPAQUE IOHEXOL 300 MG/ML  SOLN COMPARISON:  None. FINDINGS: Lower chest: Small pleural effusions bilaterally, larger on the right. Subpleural compressive atelectasis in both lungs. Right hemidiaphragm elevation. Mild cardiomegaly. Four-vessel moderate to severe coronary calcification most pronounced along the LAD. Pulmonary artery hypertension, the pulmonary trunk measuring 4.6 cm diameter. Tiny  paraesophageal hernia. Hepatobiliary: Serpiginous lobular enhancement in the medial right hepatic dome measuring up to 24 mm diameter and isoenhancing with the hepatic vasculature, likely intraparenchymal varices. Fatty infiltration of the hepatic parenchyma. Normal gallbladder and biliary tree. Pancreas: Mild fatty infiltration. No acute pancreatitis. No apparent dilatation of the main pancreatic duct. Spleen: A 12 mm ovoid enhancement within the splenic parenchyma. Adrenals/Urinary Tract: Normal bilateral adrenal glands. Simple appearing bilateral renal cortical cyst measuring up to 2.5 cm on the left and 17 Hounsfield units. No right or left hydronephrosis. Normal bilateral ureters. Symmetrical mild bilateral perinephric inflammatory change, likely sequela of medical renal disease. A nonobstructing 4 mm left renal lower pole calculus. No apparent urinary bladder abnormality. Stomach/Bowel: Large stool burden without bowel obstruction. Normal appendix, coronal series 5, image 32. Diffuse mild circumferential rectal wall thickening with inflammatory  change in the perirectal fat planes and small amount of 5 Hounsfield unit free fluid in the pre sacral space. Apparent short segment mild mucosal thickening of a left lower abdominal quadrant jejunal bowel loop, coronal series 5, image 31, and apparent diffuse gastric mucosal thickening which could be due entirely to nondistention by enteric contrast. No apparent colonic wall thickening. Mild sigmoid diverticulosis coli. No apparent colonic wall thickening. Redundant sigmoid colon with adhesion formation to the right upper and lower abdominal walls, but no volvulus. Reflux of stool into the terminal ileum. No convincing evidence for contrast extravasation into the colonic bowel lumen. Vascular/Lymphatic: Aorto bi-iliac calcified atherosclerosis without an abdominal aorta aneurysm. Nonspecific bilateral inguinal, retroperitoneal and mesenteric lymph nodes. Reproductive:  Scoliosis and moderate skeletal degenerative changes. Other: Small periumbilical and bilateral inguinal herniations of fat without strangulation or incarceration. Extensive anasarca above and below the waist. Musculoskeletal: Scoliosis and moderate skeletal degenerative changes. Mild diffuse bone demineralization. Grade 1 anterolisthesis of L5 on S1, likely degenerative. IMPRESSION: An acute mild uncomplicated proctitis with small amount of free fluid in the presacral space and edema in the perirectal fat plane without perforation or abscess. Large stool burden throughout the colon. Sigmoid diverticulosis without acute diverticulitis or contrast extravasation within the colonic lumen. Apparent mild mucosal thickening of the gastric lumen and a left lower abdominal quadrant small bowel loop, which could be secondary to incomplete distension or a mild gastroenteritis. Abnormal enhancement in the medial right hepatic dome and in the splenic parenchyma, possibly intrahepatic varices and splenic hemangioma, new or not apparent on the March 08, 2020 and April 18, 2004 CT chest studies. Mild cardiomegaly with four-vessel moderate to severe coronary calcifications, small bilateral pleural effusions and bibasilar compressive atelectasis. Extensive anasarca. Nonobstructing small left renal calculus. Aortic calcified atherosclerosis. Pulmonary artery hypertension. Electronically Signed   By: Laurence Ferrari   On: 03/14/2020 18:17   US Venous Img Upper Uni Left (DVT)  Result Date: 03/09/2020 CLINICAL DATA:  Left upper extremity edema.  Evaluate for DVT. EXAM: LEFT UPPER EXTREMITY VENOUS DOPPLER ULTRASOUND TECHNIQUE: Gray-scale sonography with graded compression, as well as color Doppler and duplex ultrasound were performed to evaluate the upper extremity deep venous system from the level of the subclavian vein and including the jugular, axillary, basilic, radial, ulnar and upper cephalic vein. Spectral Doppler was utilized to  evaluate flow at rest and with distal augmentation maneuvers. COMPARISON:  None. FINDINGS: Contralateral Subclavian Vein: Respiratory phasicity is normal and symmetric with the symptomatic side. No evidence of thrombus. Normal compressibility. Internal Jugular Vein: No evidence of thrombus. Normal compressibility, respiratory phasicity and response to augmentation. Subclavian Vein: No evidence of thrombus. Normal compressibility, respiratory phasicity and response to augmentation. Axillary Vein: No evidence of thrombus. Normal compressibility, respiratory phasicity and response to augmentation. Cephalic Vein: No evidence of thrombus. Normal compressibility, respiratory phasicity and response to augmentation. Basilic Vein: No evidence of thrombus. Normal compressibility, respiratory phasicity and response to augmentation. Brachial Veins: No evidence of thrombus. Normal compressibility, respiratory phasicity and response to augmentation. Radial Veins: No evidence of thrombus. Normal compressibility, respiratory phasicity and response to augmentation. Ulnar Veins: No evidence of thrombus. Normal compressibility, respiratory phasicity and response to augmentation. Venous Reflux:  None visualized. Other Findings: There is a moderate amount of subcutaneous edema at the level of the left arm and forearm. IMPRESSION: No evidence of DVT within the left upper extremity. Electronically Signed   By: Simonne Come M.D.   On: 03/09/2020 16:16   DG CHEST PORT  1 VIEW  Result Date: 03/13/2020 CLINICAL DATA:  Dyspnea EXAM: PORTABLE CHEST 1 VIEW COMPARISON:  03/08/2020 chest radiograph. FINDINGS: Low lung volumes. Stable cardiomediastinal silhouette with top-normal heart size. No pneumothorax. Stable elevation of the right hemidiaphragm. Blunting of the right costophrenic angle. No left pleural effusion. No overt pulmonary edema. Right basilar hazy opacity. IMPRESSION: Low lung volumes. Stable elevation of the right hemidiaphragm.  Hazy right basilar lung opacity, which could represent atelectasis, aspiration and/or pneumonia. New blunting of the right costophrenic angle, cannot exclude small right pleural effusion. Electronically Signed   By: Delbert Phenix M.D.   On: 03/13/2020 13:47   DG Chest Portable 1 View  Result Date: 03/08/2020 CLINICAL DATA:  Anasarca EXAM: PORTABLE CHEST 1 VIEW COMPARISON:  2018 FINDINGS: Stable elevation of the right hemidiaphragm. No new consolidation or edema. No significant pleural effusion. Stable cardiomediastinal contours. IMPRESSION: Stable significant elevation of the right hemidiaphragm. No acute finding. Electronically Signed   By: Guadlupe Spanish M.D.   On: 03/08/2020 16:30   ECHOCARDIOGRAM COMPLETE  Result Date: 03/09/2020    ECHOCARDIOGRAM REPORT   Patient Name:   TREJON DUFORD Date of Exam: 03/09/2020 Medical Rec #:  [id number]       Height:       66.0 in Accession #:    [id number]      Weight:       237.0 lb Date of Birth:  07-13-1933      BSA:          2.149 m Patient Age:    86 years        BP:           106/83 mmHg Patient Gender: M               HR:           97 bpm. [facility] Procedure: 2D Echo Indications:    Swelling of both lower extremities  History:        Patient has no prior history of Echocardiogram examinations.                 Arrythmias:Atrial Fibrillation; Risk Factors:Former Smoker and                 Hypertension. Cellulitis.  Sonographer:    Jeryl Columbia RDCS (AE) Referring Phys: [id number] Heloise Beecham [facility] [place] IMPRESSIONS  1. Left ventricular ejection fraction, by estimation, is 60 to 65%. The left ventricle has normal function. The left ventricle has no regional wall motion abnormalities. There is mild concentric left ventricular hypertrophy. Left ventricular diastolic parameters were normal. There is the interventricular septum is flattened in systole, consistent with right ventricular pressure overload.  2. Right ventricular systolic function is mildly  reduced. The right ventricular size is mildly enlarged. There is moderately elevated pulmonary artery systolic pressure.  3. Right atrial size was mildly dilated.  4. The mitral valve is degenerative. Mild mitral valve regurgitation.  5. Tricuspid valve regurgitation is moderate.  6. The aortic valve is tricuspid. Aortic valve regurgitation is not visualized. Mild aortic valve sclerosis is present, with no evidence of aortic valve stenosis.  7. The inferior vena cava is dilated in size with <50% respiratory variability, suggesting right atrial pressure of 15 mmHg. FINDINGS  Left Ventricle: Left ventricular ejection fraction, by estimation, is 60 to 65%. The left ventricle has normal function. The left ventricle has no regional wall motion abnormalities. The left ventricular internal cavity size was normal in size.  There is  mild concentric left ventricular hypertrophy. The interventricular septum is flattened in systole, consistent with right ventricular pressure overload. Left ventricular diastolic parameters were normal. Right Ventricle: The right ventricular size is mildly enlarged. No increase in right ventricular wall thickness. Right ventricular systolic function is mildly reduced. There is moderately elevated pulmonary artery systolic pressure. The tricuspid regurgitant velocity is 2.96 m/s, and with an assumed right atrial pressure of 15 mmHg, the estimated right ventricular systolic pressure is 50.0 mmHg. Left Atrium: Left atrial size was normal in size. Right Atrium: Right atrial size was mildly dilated. Pericardium: There is no evidence of pericardial effusion. Mitral Valve: The mitral valve is degenerative in appearance. There is mild thickening of the mitral valve leaflet(s). Moderate mitral annular calcification. Mild mitral valve regurgitation. Tricuspid Valve: The tricuspid valve is grossly normal. Tricuspid valve regurgitation is moderate. Aortic Valve: The aortic valve is tricuspid. . There is mild  thickening and mild calcification of the aortic valve. Aortic valve regurgitation is not visualized. Mild aortic valve sclerosis is present, with no evidence of aortic valve stenosis. Mild to moderate aortic valve annular calcification. There is mild thickening of the aortic valve. There is mild calcification of the aortic valve. Pulmonic Valve: The pulmonic valve was grossly normal. Pulmonic valve regurgitation is mild. Aorta: The aortic root is normal in size and structure. Venous: The inferior vena cava is dilated in size with less than 50% respiratory variability, suggesting right atrial pressure of 15 mmHg. IAS/Shunts: The interatrial septum was not well visualized.  LEFT VENTRICLE PLAX 2D LVIDd:         4.19 cm  Diastology LVIDs:         2.49 cm  LV e' lateral:   13.60 cm/s LV PW:         1.29 cm  LV E/e' lateral: 8.0 LV IVS:        1.12 cm  LV e' medial:    10.10 cm/s LVOT diam:     2.00 cm  LV E/e' medial:  10.8 LVOT Area:     3.14 cm  RIGHT VENTRICLE TAPSE (M-mode): 1.3 cm LEFT ATRIUM             Index       RIGHT ATRIUM           Index LA diam:        5.00 cm 2.33 cm/m  RA Area:     22.40 cm LA Vol (A2C):   48.2 ml 22.43 ml/m RA Volume:   63.50 ml  29.54 ml/m LA Vol (A4C):   44.5 ml 20.70 ml/m LA Biplane Vol: 46.6 ml 21.68 ml/m   AORTA Ao Root diam: 2.90 cm MITRAL VALVE                TRICUSPID VALVE MV Area (PHT): 3.58 cm     TR Peak grad:   35.0 mmHg MV Decel Time: 212 msec     TR Vmax:        296.00 cm/s MR Peak grad: 46.2 mmHg MR Mean grad: 25.0 mmHg     SHUNTS MR Vmax:      340.00 cm/s   Systemic Diam: 2.00 cm MR Vmean:     236.0 cm/s MV E velocity: 108.67 cm/s Prentice Docker MD Electronically signed by Prentice Docker MD Signature Date/Time: 03/09/2020/12:59:08 PM    Final      CBC Recent Labs  Lab 03/11/20 1610 03/13/20 9604 03/14/20 1539 03/15/20 5409 03/16/20 8119  WBC 16.4* 13.8* 19.4* 14.9* 11.8*  HGB 16.0 15.1 16.7 16.2 15.3  HCT 50.8 49.4 52.7* 51.3 49.5  PLT 100* 85*  87* 77* 83*  MCV 100.6* 103.1* 101.3* 101.8* 102.7*  MCH 31.7 31.5 32.1 32.1 31.7  MCHC 31.5 30.6 31.7 31.6 30.9  RDW 19.6* 19.1* 19.3* 19.5* 19.8*  LYMPHSABS  --   --  0.9  --   --   MONOABS  --   --  1.8*  --   --   EOSABS  --   --  0.2  --   --   BASOSABS  --   --  0.0  --   --     Chemistries  Recent Labs  Lab 03/10/20 1028 03/11/20 0427 03/13/20 0627 03/14/20 1539  NA 140 142 140 140  K 3.7 3.6 3.7 3.9  CL 97* 96* 93* 94*  CO2 35* 36* 40* 39*  GLUCOSE 112* 86 94 114*  BUN 29* 26* 21 20  CREATININE 1.25* 1.14 0.87 0.74  CALCIUM 8.9 8.5* 8.1* 8.3*  MG  --  2.3  --   --   AST  --   --   --  23  ALT  --   --   --  21  ALKPHOS  --   --   --  74  BILITOT  --   --   --  2.8*   ------------------------------------------------------------------------------------------------------------------ No results for input(s): CHOL, HDL, LDLCALC, TRIG, CHOLHDL, LDLDIRECT in the last 72 hours.  No results found for: HGBA1C ------------------------------------------------------------------------------------------------------------------ No results for input(s): TSH, T4TOTAL, T3FREE, THYROIDAB in the last 72 hours.  Invalid input(s): FREET3 ------------------------------------------------------------------------------------------------------------------ No results for input(s): VITAMINB12, FOLATE, FERRITIN, TIBC, IRON, RETICCTPCT in the last 72 hours.  Coagulation profile Recent Labs  Lab 03/14/20 1539  INR 1.6*    No results for input(s): DDIMER in the last 72 hours.  Cardiac Enzymes No results for input(s): CKMB, TROPONINI, MYOGLOBIN in the last 168 hours.  Invalid input(s): CK ------------------------------------------------------------------------------------------------------------------    Component Value Date/Time   BNP 280.0 (H) 03/08/2020 1535     Shon Hale M.D on 03/16/2020 at 6:55 PM  Go to [email or web address] - for contact info  [facility] - Office   [phone]

## 2020-03-16 NOTE — Anesthesia Preprocedure Evaluation (Addendum)
Anesthesia Evaluation  Patient identified by MRN, date of birth, ID band Patient awake    Reviewed: NPO status , Patient's Chart, lab work & pertinent test results  Airway Mallampati: III  TM Distance: >3 FB Neck ROM: Full   Comment: Difficult airway Dental  (+) Missing, Poor Dentition, Dental Advisory Given   Pulmonary sleep apnea (on 2 liters of Oxygen) and Continuous Positive Airway Pressure Ventilation , former smoker,  IMPRESSION: Low lung volumes. Stable elevation of the right hemidiaphragm. Hazy right basilar lung opacity, which could represent atelectasis, aspiration and/or pneumonia. New blunting of the right costophrenic angle, cannot exclude small right pleural effusion.   Electronically Signed   By: Ilona Sorrel M.D.   On: 03/13/2020 13:47          Cardiovascular Exercise Tolerance: Poor hypertension, +CHF  + dysrhythmias Atrial Fibrillation  Rhythm:Irregular  11-Mar-2020 08:52:30 [facility] ROUTINE RECORD Atrial fibrillation with premature ventricular or aberrantly conducted complexes Left axis deviation Right bundle branch block Possible Lateral infarct , age undetermined Inferior infarct , age undetermined Abnormal ECG Confirmed by Dixie Dials [phone]) on 03/11/2020 12:44:44 PM 16-Mar-2020 15:06:25 [facility] ROUTINE RECORD Atrial fibrillation Right bundle branch block Left anterior fascicul Bifascicular block  Abnormal ECG   Neuro/Psych    GI/Hepatic (+)     substance abuse  alcohol use, Rectal bleeding    Endo/Other    Renal/GU negative Renal ROS     Musculoskeletal negative musculoskeletal ROS (+)   Abdominal   Peds  Hematology anticoagulated for A fib, INR - 1.6   Anesthesia Other Findings 1. Left ventricular ejection fraction, by estimation, is 60 to 65%. The  left ventricle has normal function. The left ventricle has no regional  wall  motion abnormalities. There is mild concentric left ventricular  hypertrophy. Left ventricular diastolic  parameters were normal. There is the interventricular septum is flattened  in systole, consistent with right ventricular pressure overload.  2. Right ventricular systolic function is mildly reduced. The right  ventricular size is mildly enlarged. There is moderately elevated  pulmonary artery systolic pressure.  3. Right atrial size was mildly dilated.  4. The mitral valve is degenerative. Mild mitral valve regurgitation.  5. Tricuspid valve regurgitation is moderate.  6. The aortic valve is tricuspid. Aortic valve regurgitation is not  visualized. Mild aortic valve sclerosis is present, with no evidence of  aortic valve stenosis.  7. The inferior vena cava is dilated in size with <50% respiratory  variability, suggesting right atrial pressure of 15 mmHg.   Reproductive/Obstetrics negative OB ROS                        Anesthesia Physical Anesthesia Plan  ASA: IV  Anesthesia Plan: MAC   Post-op Pain Management:    Induction: Intravenous  PONV Risk Score and Plan: 1  Airway Management Planned: Nasal Cannula, Natural Airway and Nasal CPAP  Additional Equipment:   Intra-op Plan:   Post-operative Plan: Possible Post-op intubation/ventilation  Informed Consent:     Dental advisory given  Plan Discussed with:   Anesthesia Plan Comments: (Will review labs and EKG tomorrow.)       Anesthesia Quick Evaluation

## 2020-03-17 ENCOUNTER — Encounter (HOSPITAL_COMMUNITY)
Admission: EM | Disposition: A | Discharge status: Skilled Nursing Facility | Payer: Self-pay | Source: Home / Self Care | Attending: Family Medicine

## 2020-03-17 ENCOUNTER — Encounter (HOSPITAL_COMMUNITY): Payer: Self-pay | Admitting: Internal Medicine

## 2020-03-17 ENCOUNTER — Inpatient Hospital Stay (HOSPITAL_COMMUNITY): Payer: Medicare Other | Admitting: Anesthesiology

## 2020-03-17 HISTORY — PX: ESOPHAGEAL DILATION: SHX303

## 2020-03-17 HISTORY — PX: FLEXIBLE SIGMOIDOSCOPY: SHX5431

## 2020-03-17 HISTORY — PX: ESOPHAGOGASTRODUODENOSCOPY (EGD) WITH PROPOFOL: SHX5813

## 2020-03-17 HISTORY — PX: BIOPSY: SHX5522

## 2020-03-17 LAB — COMPREHENSIVE METABOLIC PANEL
ALT: 23 U/L (ref 0–44)
AST: 28 U/L (ref 15–41)
Albumin: 2.3 g/dL — ABNORMAL LOW (ref 3.5–5.0)
Alkaline Phosphatase: 61 U/L (ref 38–126)
Anion gap: 6 (ref 5–15)
BUN: 20 mg/dL (ref 8–23)
CO2: 34 mmol/L — ABNORMAL HIGH (ref 22–32)
Calcium: 8.2 mg/dL — ABNORMAL LOW (ref 8.9–10.3)
Chloride: 98 mmol/L (ref 98–111)
Creatinine, Ser: 0.67 mg/dL (ref 0.61–1.24)
GFR calc Af Amer: >60 mL/min (ref >60)
GFR calc non Af Amer: >60 mL/min (ref >60)
Glucose, Bld: 82 mg/dL (ref 70–99)
Potassium: 3.8 mmol/L (ref 3.5–5.1)
Sodium: 138 mmol/L (ref 135–145)
Total Bilirubin: 3.8 mg/dL — ABNORMAL HIGH (ref 0.3–1.2)
Total Protein: 4.9 g/dL — ABNORMAL LOW (ref 6.5–8.1)

## 2020-03-17 LAB — MAGNESIUM: Magnesium: 1.9 mg/dL (ref 1.7–2.4)

## 2020-03-17 LAB — MRSA PCR SCREENING: MRSA by PCR: NEGATIVE

## 2020-03-17 SURGERY — ESOPHAGOGASTRODUODENOSCOPY (EGD) WITH PROPOFOL
Anesthesia: Monitor Anesthesia Care

## 2020-03-17 MED ORDER — PHENYLEPHRINE 40 MCG/ML (10ML) SYRINGE FOR IV PUSH (FOR BLOOD PRESSURE SUPPORT)
PREFILLED_SYRINGE | INTRAVENOUS | Status: AC
  Filled 2020-03-17: qty 10

## 2020-03-17 MED ORDER — LACTATED RINGERS IV SOLN: INTRAVENOUS | Status: DC | PRN

## 2020-03-17 MED ORDER — LIDOCAINE VISCOUS HCL 2 % MT SOLN
OROMUCOSAL | Status: AC
  Filled 2020-03-17: qty 15

## 2020-03-17 MED ORDER — POLYETHYLENE GLYCOL 3350 17 G PO PACK
17.0000 g | PACK | Freq: Two times a day (BID) | ORAL | Status: DC
  Administered 2020-03-17 – 2020-03-18 (×3): 17 g via ORAL
  Filled 2020-03-17 (×3): qty 1

## 2020-03-17 MED ORDER — PROPOFOL 10 MG/ML IV BOLUS
INTRAVENOUS | Status: DC | PRN
  Administered 2020-03-17 (×4): 20 mg via INTRAVENOUS

## 2020-03-17 MED ORDER — LIDOCAINE 2% (20 MG/ML) 5 ML SYRINGE
INTRAMUSCULAR | Status: AC
  Filled 2020-03-17: qty 5

## 2020-03-17 MED ORDER — GLYCOPYRROLATE PF 0.2 MG/ML IJ SOSY
PREFILLED_SYRINGE | INTRAMUSCULAR | Status: DC | PRN
  Administered 2020-03-17: .2 mg via INTRAVENOUS

## 2020-03-17 MED ORDER — PHENYLEPHRINE HCL (PRESSORS) 10 MG/ML IV SOLN
INTRAVENOUS | Status: DC | PRN
  Administered 2020-03-17 (×2): 200 ug via INTRAVENOUS
  Administered 2020-03-17: 120 ug via INTRAVENOUS
  Administered 2020-03-17: 200 ug via INTRAVENOUS
  Administered 2020-03-17: 40 ug via INTRAVENOUS
  Administered 2020-03-17: 120 ug via INTRAVENOUS
  Administered 2020-03-17 (×4): 200 ug via INTRAVENOUS
  Administered 2020-03-17: 120 ug via INTRAVENOUS
  Administered 2020-03-17: 200 ug via INTRAVENOUS

## 2020-03-17 MED ORDER — KETAMINE HCL 50 MG/5ML IJ SOSY
PREFILLED_SYRINGE | INTRAMUSCULAR | Status: AC
  Filled 2020-03-17: qty 5

## 2020-03-17 MED ORDER — PANTOPRAZOLE SODIUM 40 MG PO TBEC
40.0000 mg | DELAYED_RELEASE_TABLET | Freq: Every day | ORAL | Status: DC
  Administered 2020-03-17 – 2020-03-18 (×2): 40 mg via ORAL
  Filled 2020-03-17 (×2): qty 1

## 2020-03-17 MED ORDER — GLYCOPYRROLATE PF 0.2 MG/ML IJ SOSY
PREFILLED_SYRINGE | INTRAMUSCULAR | Status: AC
  Filled 2020-03-17: qty 1

## 2020-03-17 MED ORDER — LACTATED RINGERS IV SOLN
Freq: Once | INTRAVENOUS | Status: AC
  Administered 2020-03-17: 1000 mL via INTRAVENOUS

## 2020-03-17 MED ORDER — SODIUM CHLORIDE 0.9 % IV SOLN: INTRAVENOUS | Status: DC

## 2020-03-17 MED ORDER — PROPOFOL 500 MG/50ML IV EMUL: INTRAVENOUS | Status: DC | PRN

## 2020-03-17 MED ORDER — PROPOFOL 10 MG/ML IV BOLUS
INTRAVENOUS | Status: AC
  Filled 2020-03-17: qty 20

## 2020-03-17 MED ORDER — LIDOCAINE VISCOUS HCL 2 % MT SOLN
15.0000 mL | Freq: Once | OROMUCOSAL | Status: AC
  Administered 2020-03-17: 15 mL via OROMUCOSAL

## 2020-03-17 MED ORDER — LIDOCAINE HCL (CARDIAC) PF 100 MG/5ML IV SOSY
PREFILLED_SYRINGE | INTRAVENOUS | Status: DC | PRN
  Administered 2020-03-17: 60 mg via INTRAVENOUS

## 2020-03-17 MED ORDER — KETAMINE HCL 10 MG/ML IJ SOLN
INTRAMUSCULAR | Status: DC | PRN
  Administered 2020-03-17: 20 mg via INTRAVENOUS

## 2020-03-17 MED ORDER — PHENYLEPHRINE 40 MCG/ML (10ML) SYRINGE FOR IV PUSH (FOR BLOOD PRESSURE SUPPORT)
PREFILLED_SYRINGE | INTRAVENOUS | Status: AC
  Filled 2020-03-17: qty 30

## 2020-03-17 NOTE — Care Management (Signed)
Patient will need a new authorization to return to [facility]. Patient will need a PT Eval prior to starting authorization. Attending notified to order new PT evaluation when appropriate.

## 2020-03-17 NOTE — Op Note (Addendum)
[facility] Patient Name: Jesus Fowler Procedure Date: 03/17/2020 9:28 AM MRN: [id number] Date of Birth: Sep 20, 1933 Attending MD: [name] , MD CSN: [id number] Age: 86 Admit Type: Outpatient Procedure:                Upper GI endoscopy Indications:              Dysphagia Providers:                [name], MD, Criselda Peaches. Patsy Lager, RN,                            Burke Keels, Technician Referring MD:              Medicines:                Propofol per Anesthesia Complications:            No immediate complications. Estimated Blood Loss:     Estimated blood loss was minimal. Estimated blood                            loss was minimal. Procedure:                Pre-Anesthesia Assessment:                           - Prior to the procedure, a History and Physical                            was performed, and patient medications and                            allergies were reviewed. The patient's tolerance of                            previous anesthesia was also reviewed. The risks                            and benefits of the procedure and the sedation                            options and risks were discussed with the patient.                            All questions were answered, and informed consent                            was obtained. Prior Anticoagulants: The patient                            last took Eliquis (apixaban) 5 days prior to the                            procedure. ASA Grade Assessment: III - A patient  with severe systemic disease. After reviewing the                            risks and benefits, the patient was deemed in                            satisfactory condition to undergo the procedure.                           After obtaining informed consent, the endoscope was                            passed under direct vision. Throughout the                            procedure, the patient's blood  pressure, pulse, and                            oxygen saturations were monitored continuously. The                            [id number]-[id number] ([id number]) scope was introduced through the                            mouth, and advanced to the second part of duodenum.                            The upper GI endoscopy was accomplished without                            difficulty. The patient tolerated the procedure                            well. Scope In: 9:59:51 AM Scope Out: 10:08:05 AM Total Procedure Duration: 0 hours 8 minutes 14 seconds  Findings:      The examined esophagus was normal. Widely patent. No varices.      Moderate changes consistent with portal hypertensive gastropathy (snake       skinning/fish scale appearance )was found in the entire examined       stomach. Multiple antral erosions. No ulcer or infiltrating process       observed. Pylorus patent.      The duodenal bulb and second portion of the duodenum were normal. The       scope was withdrawn. Dilation was performed with a Maloney dilator with       mild resistance at 56 Fr. The dilation site was examined following       endoscope reinsertion and showed no change. Estimated blood loss was       minimal. Finally, biopsies of the abnormal gastric mucosa taken. Impression:               - Normal esophagus. Dilated.                           -Gastric mucosal changes consistent with portal  hypertensive gastropathy . Gastric erosions. Status                            post gastric biopsy.                           - Normal duodenal bulb and second portion of the                            duodenum. Moderate Sedation:      Moderate (conscious) sedation was personally administered by an       anesthesia professional. The following parameters were monitored: oxygen       saturation, heart rate, blood pressure, respiratory rate, EKG, adequacy       of pulmonary ventilation, and response to  care. Recommendation:           - Patient has a contact number available for                            emergencies. The signs and symptoms of potential                            delayed complications were discussed with the                            patient. Return to normal activities tomorrow.                            Written discharge instructions were provided to the                            patient.                           -Dysphagia 2 diet. Daily PPI. Follow-up on                            pathology. See flexible sigmoidoscopy report.                            Underlying cirrhosis for which further evaluation                            not recommended at this time given his                            comorbidities. I have discussed my findings and                            recommendations at length with the son Sharl Ma at                            [phone] Procedure Code(s):        --- Professional ---  28366, Esophagogastroduodenoscopy, flexible,                            transoral; diagnostic, including collection of                            specimen(s) by brushing or washing, when performed                            (separate procedure)                           43450, Dilation of esophagus, by unguided sound or                            bougie, single or multiple passes Diagnosis Code(s):        --- Professional ---                           K76.6, Portal hypertension                           K31.89, Other diseases of stomach and duodenum                           R13.10, Dysphagia, unspecified CPT copyright 2019 American Medical Association. All rights reserved. The codes documented in this report are preliminary and upon coder review may  be revised to meet current compliance requirements. [name]. Delvina Mizzell, MD [name], MD 03/17/2020 10:13:42 AM This report has been signed electronically. Number of Addenda: 0

## 2020-03-17 NOTE — Op Note (Addendum)
[facility] Patient Name: Jesus Fowler Procedure Date: 03/17/2020 10:10 AM MRN: [id number] Date of Birth: 01-Jul-1933 Attending MD: Norvel Richards , MD CSN: [id number] Age: 86 Admit Type: Outpatient Procedure:                Flexible Sigmoidoscopy Indications:              Abnormal CT of the GI tract(rectum) Providers:                Norvel Richards, MD, Gwenlyn Fudge RN, RN,                            Randa Spike, Technician Referring MD:              Medicines:                Propofol per Anesthesia Complications:            No immediate complications. Estimated Blood Loss:     Estimated blood loss was minimal. Procedure:                Pre-Anesthesia Assessment:                           - Prior to the procedure, a History and Physical                            was performed, and patient medications and                            allergies were reviewed. The patient's tolerance of                            previous anesthesia was also reviewed. The risks                            and benefits of the procedure and the sedation                            options and risks were discussed with the patient.                            All questions were answered, and informed consent                            was obtained. Prior Anticoagulants: The patient                            last took Eliquis (apixaban) 5 days prior to the                            procedure. ASA Grade Assessment: III - A patient                            with severe systemic disease. After reviewing the  risks and benefits, the patient was deemed in                            satisfactory condition to undergo the procedure.                           After obtaining informed consent, the scope was                            passed under direct vision. The [id number]-[id number] ([id number]                            scope was introduced through the anus and advanced                     to the the sigmoid colon. The flexible                            sigmoidoscopy was accomplished without difficulty.                            The patient tolerated the procedure well. The                            quality of the bowel preparation was adequate. Scope In: 10:15:17 AM Scope Out: 10:23:18 AM Total Procedure Duration: 0 hours 8 minutes 1 second  Findings:      Circumferentially ulcerated distal rectum within 3 cm of the anal verge.       More proximally patient had erosions and some fibrotic changes another 3       to 4 cm circumferentially. Proximal to this area the rectal mucosa all       the way up into the mixed mid sigmoid (35 cm) appeared normal. There was       formed stool in the lumen of the sigmoid colon trailing down into the       rectum. No neoplasm or polyps seen. Biopsies of the abnormal rectal       mucosa were taken. Impression:               -Distal rectal ulceration with erosion involving                            the distal 6 to 7 cm of the rectum                            circumferentially -likely pressure induced                            phenomenon (constipation). Status post biopsy. Moderate Sedation:      Moderate (conscious) sedation was personally administered by an       anesthesia professional. The following parameters were monitored: oxygen       saturation, heart rate, blood pressure, and response to care. Recommendation:           Avoid constipation. Would continue Linzess 145  daily definitely as an outpatient. AN ALTERNATIVE                            BOWEL REGIMEN would include Colace 100 mg twice                            daily every day indefinitely. MiraLAX 17 g once                            daily as a scheduled medication (whether patient                            feels he needs it or not). Would consider a second                            dose of MiraLAX on any given day patient  does not                            have a bowel movement. Follow-up on pathology. Procedure Code(s):        --- Professional ---                           [phone], Sigmoidoscopy, flexible; diagnostic,                            including collection of specimen(s) by brushing or                            washing, when performed (separate procedure) Diagnosis Code(s):        --- Professional ---                           R93.3, Abnormal findings on diagnostic imaging of                            other parts of digestive tract CPT copyright 2019 American Medical Association. All rights reserved. The codes documented in this report are preliminary and upon coder review may  be revised to meet current compliance requirements. [name]. [name], MD [name], MD 03/17/2020 10:33:34 AM This report has been signed electronically. Number of Addenda: 0

## 2020-03-17 NOTE — Anesthesia Postprocedure Evaluation (Addendum)
Anesthesia Post Note  Patient: Jesus Fowler  Procedure(s) Performed: ESOPHAGOGASTRODUODENOSCOPY (EGD) WITH PROPOFOL (N/A ) ESOPHAGEAL DILATION (N/A ) FLEXIBLE SIGMOIDOSCOPY WITH PROPOFOL (N/A ) BIOPSY  Patient location during evaluation: PACU Anesthesia Type: General Level of consciousness: awake, oriented, awake and alert and patient cooperative Pain management: pain level controlled Vital Signs Assessment: post-procedure vital signs reviewed and stable Respiratory status: spontaneous breathing, respiratory function stable and nonlabored ventilation Cardiovascular status: stable Postop Assessment: no apparent nausea or vomiting Anesthetic complications: no     Last Vitals:  Vitals:   03/17/20 0543 03/17/20 0826  BP: 107/72 108/67  Pulse: 88 84  Resp: 19 18  Temp: (!) 36.2 C 36.8 C  SpO2: 100% 90%    Last Pain:  Vitals:   03/17/20 0950  TempSrc:   PainSc: 0-No pain                 GREGORY,SUZANNE

## 2020-03-17 NOTE — Evaluation (Signed)
Physical Therapy Evaluation Patient Details Name: Jesus Fowler MRN: [id number] DOB: 1933-10-28 Today's Date: 03/17/2020   History of Present Illness  Jesus Fowler is a 86 y.o. male with medical history significant of recent hospitalization for anasarca and cellulitis of the UE. He has chronic diastolic HFpEF and responded to diuretic therapy. He was treated for cellulitis with abx and d/c'd today on augmentin. At the SNF facility he was found by admitting APP to have rectal bleeding. He was sent to [facility] for evaluation.    Clinical Impression  Patient limited for functional mobility as stated below secondary to BLE weakness, fatigue and poor standing balance.  Patient able to take a few side steps at bedside demonstrating slow labored movement and tolerated sitting up in chair with his son present in room after therapy - RN notified.   Patient will benefit from continued physical therapy in hospital and recommended venue below to increase strength, balance, endurance for safe ADLs and gait.    Follow Up Recommendations SNF    Equipment Recommendations  None recommended by PT    Recommendations for Other Services       Precautions / Restrictions Precautions Precautions: Fall Precaution Comments: wounds BLE Restrictions Weight Bearing Restrictions: No      Mobility  Bed Mobility Overal bed mobility: Needs Assistance Bed Mobility: Supine to Sit     Supine to sit: Min assist;Mod assist     General bed mobility comments: increased time, labored movement  Transfers Overall transfer level: Needs assistance Equipment used: Rolling walker (2 wheeled) Transfers: Sit to/from [facility] Sit to Stand: Min assist Stand pivot transfers: Min assist;Mod assist       General transfer comment: slow labored movement  Ambulation/Gait Ambulation/Gait assistance: Mod assist Gait Distance (Feet): 5 Feet Assistive device: Rolling walker (2 wheeled) Gait  Pattern/deviations: Decreased step length - right;Decreased step length - left;Decreased stride length Gait velocity: decreased   General Gait Details: limited to 5-6 slow labored side steps due to c/o fatigue and BLE weakness, RLE weaker than left per patient  Stairs            Wheelchair Mobility    Modified Rankin (Stroke Patients Only)       Balance Overall balance assessment: Needs assistance Sitting-balance support: Feet supported;No upper extremity supported Sitting balance-Leahy Scale: Fair Sitting balance - Comments: seated EOB   Standing balance support: During functional activity;Bilateral upper extremity supported Standing balance-Leahy Scale: Fair Standing balance comment: using RW                             Pertinent Vitals/Pain Pain Assessment: No/denies pain    Home Living Family/patient expects to be discharged to:: Private residence Living Arrangements: Spouse/significant other Available Help at Discharge: Family;Available 24 hours/day Type of Home: House Home Access: Stairs to enter Entrance Stairs-Rails: Right Entrance Stairs-Number of Steps: 3 Home Layout: One level Home Equipment: Walker - 2 wheels;Cane - single point;Shower seat      Prior Function Level of Independence: Needs assistance   Gait / Transfers Assistance Needed: patient states household ambulator with SPC or RW  ADL's / Homemaking Assistance Needed: wife assists        Hand Dominance        Extremity/Trunk Assessment   Upper Extremity Assessment Upper Extremity Assessment: Generalized weakness    Lower Extremity Assessment Lower Extremity Assessment: Generalized weakness    Cervical / Trunk Assessment Cervical / Trunk Assessment: Kyphotic  Communication   Communication: No difficulties  Cognition Arousal/Alertness: Awake/alert Behavior During Therapy: WFL for tasks assessed/performed Overall Cognitive Status: Within Functional Limits for tasks  assessed                                        General Comments      Exercises     Assessment/Plan    PT Assessment Patient needs continued PT services  PT Problem List Decreased strength;Decreased activity tolerance;Decreased balance;Decreased mobility       PT Treatment Interventions Gait training;Stair training;Therapeutic activities;Therapeutic exercise;Functional mobility training;Patient/family education    PT Goals (Current goals can be found in the Care Plan section)  Acute Rehab PT Goals Patient Stated Goal: Return home PT Goal Formulation: With patient Time For Goal Achievement: 03/31/20 Potential to Achieve Goals: Good    Frequency Min 3X/week   Barriers to discharge        Co-evaluation               AM-PAC PT "6 Clicks" Mobility  Outcome Measure Help needed turning from your back to your side while in a flat bed without using bedrails?: A Little Help needed moving from lying on your back to sitting on the side of a flat bed without using bedrails?: A Little Help needed moving to and from a bed to a chair (including a wheelchair)?: A Lot Help needed standing up from a chair using your arms (e.g., wheelchair or bedside chair)?: A Lot Help needed to walk in hospital room?: A Lot Help needed climbing 3-5 steps with a railing? : A Lot 6 Click Score: 14    End of Session Equipment Utilized During Treatment: Oxygen Activity Tolerance: Patient limited by fatigue Patient left: in chair;with call bell/phone within reach;with family/visitor present Nurse Communication: Mobility status PT Visit Diagnosis: Unsteadiness on feet (R26.81);Other abnormalities of gait and mobility (R26.89);Muscle weakness (generalized) (M62.81)    Time: 0762-2633 PT Time Calculation (min) (ACUTE ONLY): 25 min   Charges:   PT Evaluation $PT Eval Moderate Complexity: 1 Mod PT Treatments $Therapeutic Activity: 23-37 mins        3:36 PM, 03/17/20 Ocie Bob, MPT Physical Therapist with [facility] [phone] office [phone] mobile phone

## 2020-03-17 NOTE — Plan of Care (Signed)
  Problem: Acute Rehab PT Goals(only PT should resolve) Goal: Pt Will Go Supine/Side To Sit Outcome: Progressing Flowsheets (Taken 03/17/2020 1537) Pt will go Supine/Side to Sit: with min guard assist Goal: Patient Will Transfer Sit To/From Stand Outcome: Progressing Flowsheets (Taken 03/17/2020 1537) Patient will transfer sit to/from stand:  with minimal assist  with min guard assist Goal: Pt Will Transfer Bed To Chair/Chair To Bed Outcome: Progressing Flowsheets (Taken 03/17/2020 1537) Pt will Transfer Bed to Chair/Chair to Bed:  with min assist  min guard assist Goal: Pt Will Ambulate Outcome: Progressing Flowsheets (Taken 03/17/2020 1537) Pt will Ambulate:  25 feet  with minimal assist  with rolling walker   3:38 PM, 03/17/20 Ocie Bob, MPT Physical Therapist with [facility] [phone] office [phone] mobile phone

## 2020-03-17 NOTE — Progress Notes (Signed)
Patient Demographics:    Jesus Fowler, is a 86 y.o. male, DOB - 08-30-1933, WUJ:[id number]  Admit date - 03/14/2020   Admitting Physician Jacques NavyMichael E Norins, MD  Outpatient Primary MD for the patient is Jesus Fowler, Richard, MD  LOS - 3  Chief Complaint  Patient presents with  . GI Bleeding        Subjective:    Jesus Fowler Gajda today has no fevers, no emesis,  No chest pain,   --No significant further rectal bleeding -son at bedside -Eating and drinking okay after EGD and flexible sigmoidoscopy  Assessment  & Plan :    Active Problems:   Atrial fibrillation, chronic (HCC)   Acute on chronic heart failure with preserved ejection fraction (HFpEF) /Diastolic Dysfunction   Swelling of both lower extremities   OSA on CPAP   Hematochezia   Proctitis   Rectal bleeding   Dysphagia   Early satiety   Brief Summary -86 y.o.malewith medical history significant foratrial fibrillation, hypertension readmitted on 03/14/2020 after being discharged same date to SNF rehab with episode of rectal bleeding -Rectal bleeding this is a new finding and new diagnosis for patient (was not present during recent hospitalization) -EGD and flexible sigmoidoscopy completed 03/17/2020 -Awaiting insurance preauthorization to return to SNF rehab   A/p  1)Rectal bleeding/painless hematochezia --- GI input appreciated H&H is stable, -CT abdomen and pelvis suggest diverticulosis of the sigmoid colon - flexible sigmoidoscopy  on 03/17/2020 showed distal rectal laceration with thyroid zone, presumed secondary to pressure injury from constipation-biopsy result pending -Hemoglobin is 15.3, no further significant rectal bleeding -Significant amount of retained stool noted in the lumen of the sigmoid colon and rectum -Avoid further constipation, start MiraLAX 1 p.o. twice daily and then may decrease to once daily with another dose  as needed if no BM in 24 hours  2)Acute Hypoxic Respiratory Failure--- suspect acute on chronic HFpEF/CHF related, BNP is 280,  --Patient is a reformed smoker so cannot exclude some component of COPD -CTA chest w/o acute PE, small right pleural effusion and right-sided atelectasis noted --... Currently requiring 2 L of oxygen via nasal cannula -PTA patient was not on home O2 -Echo with EF of 60 to 65% -Recently aggressively diuresed with IV Lasix --weight is down to 218pounds from 236.99 -Fluid balance is negative -Continue p.o. torsemide -Continue CPAP with sleep  3)Left upper extremity cellulitis----WBC is down to11.648from 21.1, -- PTA patient was treated with Levaquin as outpatient, no fevers, treated with IV Rocephin, Blood Cx NGTD -Left upper extremity venous Dopplers without acute DVT Okay to complete p.o. Augmentin    4) chronic Atrial fibrillation- rates 80s to 90s. Rate controlled Last dose of Eliquis was 03/14/2020  -May restart Eliquis on 03/18/2020  -we already decreased Toprol-XL to 12.5 mg daily from 50 mg daily due to soft BP for rate control  5)Prolonged QTC-457 --- Keep potassium close to 4 magnesium above 2  6)Elevated bilirubin-T bil 2.7 to 2.9 (indirect 1.9, direct 0.8), --LFTs WNL -Asymptomatic at this time outpatient follow-up advised.  7)Hypertension--- BP soft despite holding lisinopril/HCTZ and amlodipine, decreased Toprol-XL to 12.5 mg daily from 50 mg daily  8)BPH--- continue Flomax  9)Dysphagia-- Speech eval appreciated, recommends---Dysphagia 2 (Fine chop);Thin liquid, Pt benefits from liquid wash  during PO consumption. - EGD with dilatation on 03/17/2020 with evidence of portal hypertensive gastropathy and antral erosions -PPI as prescribed -Patient tolerating oral intake better after EGD with dilatation on 03/17/2020  10)Generalized Weakness and Deconditioning-TSH 0.4, -anticipate discharge back to SNF -PT reevaluation  pending  Disposition--anticipate discharge to SNF for rehab  --Awaiting insurance authorization to transfer back to SNF rehab -Patient is medically ready for discharge  Code Status:Full  Family Communication: (patient is alert, awake and coherent) Discussed withson who is visiting from [place] and wife   Consults :GI DVT Prophylaxis: - SCDs-=-GI bleed   Lab Results  Component Value Date   PLT 83 (L) 03/16/2020    Inpatient Medications  Scheduled Meds: . amoxicillin-clavulanate  1 tablet Oral BID  . chlorhexidine  15 mL Mouth Rinse BID  . erythromycin   Both Eyes TID  . feeding supplement (ENSURE ENLIVE)  237 mL Oral BID BM  . linaclotide  145 mcg Oral QAC breakfast  . mouth rinse  15 mL Mouth Rinse q12n4p  . metoprolol succinate  12.5 mg Oral Daily  . omega-3 acid ethyl esters  2 g Oral Daily  . polyethylene glycol  17 g Oral BID  . potassium chloride  10 mEq Oral Daily  . rosuvastatin  10 mg Oral q1800  . sodium chloride  2 spray Each Nare TID  . sodium phosphate  1 enema Rectal Once  . sodium phosphate  1 enema Rectal Once  . tamsulosin  0.4 mg Oral Daily  . torsemide  20 mg Oral Daily  . vitamin E  800 Units Oral Daily   Continuous Infusions: . sodium chloride     PRN Meds:.acetaminophen, albuterol, hydrOXYzine, polyethylene glycol   Anti-infectives (From admission, onward)   Start     Dose/Rate Route Frequency Ordered Stop   03/14/20 2200  amoxicillin-clavulanate (AUGMENTIN) 875-125 MG per tablet 1 tablet     1 tablet Oral 2 times daily 03/14/20 2143          Objective:   Vitals:   03/17/20 0826 03/17/20 1030 03/17/20 1045 03/17/20 1300  BP: 108/67 (!) 97/59 91/66   Pulse: 84 97 98   Resp: Temp: 98.2 F (36.8 C) 97.6 F (36.4 C)    TempSrc: Oral     SpO2: 90% 100% 97% 96%  Weight:      Height:        Wt Readings from Last 3 Encounters:  03/15/20 99.1 kg  03/14/20 100.2 kg  03/14/20 100.2 kg     Intake/Output  Summary (Last 24 hours) at 03/17/2020 1446 Last data filed at 03/17/2020 1022 Gross per 24 hour  Intake 500 ml  Output 1150 ml  Net -650 ml     Physical Exam Gen:- Awake Alert, In no apparent distress  HEENT:- [place].AT, No sclera icterus Eye--much improved conjunctivae findings  Nose -2L/min Neck-Supple Neck,No JVD,.  Lungs-improving air movement, no wheezing  CV- S1, S2 normal, irregularly irregular  abd- +ve B.Sounds, Abd Soft, No tenderness,  Extremity/Skin:- pedal pulses present, left upper extremity  cellulitis appears to be mostly resolving, chronic lower extremity edema, chronic severe venous stasis dermatitis type changes of both lower extremities Psych-affect is appropriate, oriented x3 Neuro-generalized weakness and deconditioning, no new focal deficits, no tremors   Data Review:   Micro Results Recent Results (from the past 240 hour(s))  SARS CORONAVIRUS 2 (TAT 6-24 HRS) Nasopharyngeal Nasopharyngeal Swab     Status: None   Collection Time:  03/08/20 11:30 PM   Specimen: Nasopharyngeal Swab  Result Value Ref Range Status   SARS Coronavirus 2 NEGATIVE NEGATIVE Final    Comment: (NOTE) SARS-CoV-2 target nucleic acids are NOT DETECTED. The SARS-CoV-2 RNA is generally detectable in upper and lower respiratory specimens during the acute phase of infection. Negative results do not preclude SARS-CoV-2 infection, do not rule out co-infections with other pathogens, and should not be used as the sole basis for treatment or other patient management decisions. Negative results must be combined with clinical observations, patient history, and epidemiological information. The expected result is Negative. Fact Sheet for Patients: HairSlick.no Fact Sheet for Healthcare Providers: quierodirigir.com This test is not yet approved or cleared by the [place] FDA and  has been authorized for detection and/or diagnosis of  SARS-CoV-2 by FDA under an Emergency Use Authorization (EUA). This EUA will remain  in effect (meaning this test can be used) for the duration of the COVID-19 declaration under Section 56 4(b)(1) of the Act, 21 U.S.C. section 360bbb-3(b)(1), unless the authorization is terminated or revoked sooner. Performed at [facility], [address] [place], [place] [address]   Culture, blood (Routine X 2) w Reflex to ID Panel     Status: None   Collection Time: 03/09/20  6:25 PM   Specimen: Right Antecubital; Blood  Result Value Ref Range Status   Specimen Description RIGHT ANTECUBITAL  Final   Special Requests   Final    BOTTLES DRAWN AEROBIC AND ANAEROBIC Blood Culture adequate volume   Culture   Final    NO GROWTH 5 DAYS Performed at [facility], [address], [place], [place] [address]    Report Status 03/14/2020 FINAL  Final  Culture, blood (Routine X 2) w Reflex to ID Panel     Status: None   Collection Time: 03/09/20  6:25 PM   Specimen: BLOOD RIGHT HAND  Result Value Ref Range Status   Specimen Description BLOOD RIGHT HAND  Final   Special Requests   Final    BOTTLES DRAWN AEROBIC ONLY Blood Culture adequate volume   Culture   Final    NO GROWTH 5 DAYS Performed at [facility], [address] [place], [place] [address]    Report Status 03/14/2020 FINAL  Final  SARS CORONAVIRUS 2 (TAT 6-24 HRS) Nasopharyngeal Nasopharyngeal Swab     Status: None   Collection Time: 03/11/20  5:57 PM   Specimen: Nasopharyngeal Swab  Result Value Ref Range Status   SARS Coronavirus 2 NEGATIVE NEGATIVE Final    Comment: (NOTE) SARS-CoV-2 target nucleic acids are NOT DETECTED. The SARS-CoV-2 RNA is generally detectable in upper and lower respiratory specimens during the acute phase of infection. Negative results do not preclude SARS-CoV-2 infection, do not rule out co-infections with other pathogens, and should not be used as the sole basis for treatment or other patient management  decisions. Negative results must be combined with clinical observations, patient history, and epidemiological information. The expected result is Negative. Fact Sheet for Patients: HairSlick.no Fact Sheet for Healthcare Providers: quierodirigir.com This test is not yet approved or cleared by the [place] FDA and  has been authorized for detection and/or diagnosis of SARS-CoV-2 by FDA under an Emergency Use Authorization (EUA). This EUA will remain  in effect (meaning this test can be used) for the duration of the COVID-19 declaration under Section 56 4(b)(1) of the Act, 21 U.S.C. section 360bbb-3(b)(1), unless the authorization is terminated or revoked sooner. Performed at [facility], [place] [address], [place], [place] [address]   SARS CORONAVIRUS 2 (TAT 6-24 HRS) Nasopharyngeal Nasopharyngeal Swab     Status: None   Collection Time: 03/14/20  5:00 AM   Specimen: Nasopharyngeal Swab  Result Value Ref Range Status   SARS Coronavirus 2 NEGATIVE NEGATIVE Final    Comment: (NOTE) SARS-CoV-2 target nucleic acids are NOT DETECTED. The SARS-CoV-2 RNA is generally detectable in upper and lower respiratory specimens during the acute phase of infection. Negative results do not preclude SARS-CoV-2 infection, do not rule out co-infections with other pathogens, and should not be used as the sole basis for treatment or other patient management decisions. Negative results must be combined with clinical observations, patient history, and epidemiological information. The expected result is Negative. Fact Sheet for Patients: SugarRoll.be Fact Sheet for Healthcare Providers: https://www.woods-mathews.com/ This test is not yet approved or cleared by the [place] FDA and  has been authorized for detection and/or diagnosis of SARS-CoV-2 by FDA under an Emergency Use Authorization (EUA). This  EUA will remain  in effect (meaning this test can be used) for the duration of the COVID-19 declaration under Section 56 4(b)(1) of the Act, 21 U.S.C. section 360bbb-3(b)(1), unless the authorization is terminated or revoked sooner. Performed at [facility], [place] [address] [place], [place] [address]   Respiratory Panel by RT PCR (Flu A&B, Covid) - Nasopharyngeal Swab     Status: None   Collection Time: 03/14/20 11:30 AM   Specimen: Nasopharyngeal Swab  Result Value Ref Range Status   SARS Coronavirus 2 by RT PCR NEGATIVE NEGATIVE Final    Comment: (NOTE) SARS-CoV-2 target nucleic acids are NOT DETECTED. The SARS-CoV-2 RNA is generally detectable in upper respiratoy specimens during the acute phase of infection. The lowest concentration of SARS-CoV-2 viral copies this assay can detect is 131 copies/mL. A negative result does not preclude SARS-Cov-2 infection and should not be used as the sole basis for treatment or other patient management decisions. A negative result may occur with  improper specimen collection/handling, submission of specimen other than nasopharyngeal swab, presence of viral mutation(s) within the areas targeted by this assay, and inadequate number of viral copies (<131 copies/mL). A negative result must be combined with clinical observations, patient history, and epidemiological information. The expected result is Negative. Fact Sheet for Patients:  PinkCheek.be Fact Sheet for Healthcare Providers:  GravelBags.it This test is not yet ap proved or cleared by the [place] FDA and  has been authorized for detection and/or diagnosis of SARS-CoV-2 by FDA under an Emergency Use Authorization (EUA). This EUA will remain  in effect (meaning this test can be used) for the duration of the COVID-19 declaration under Section 564(b)(1) of the Act, 21 U.S.C. section 360bbb-3(b)(1), unless the authorization  is terminated or revoked sooner.    Influenza A by PCR NEGATIVE NEGATIVE Final   Influenza B by PCR NEGATIVE NEGATIVE Final    Comment: (NOTE) The Xpert Xpress SARS-CoV-2/FLU/RSV assay is intended as an aid in  the diagnosis of influenza from Nasopharyngeal swab specimens and  should not be used as a sole basis for treatment. Nasal washings and  aspirates are unacceptable for Xpert Xpress SARS-CoV-2/FLU/RSV  testing. Fact Sheet for Patients: PinkCheek.be Fact Sheet for Healthcare Providers: GravelBags.it This test is not yet approved or cleared by the [place] FDA and  has been authorized for detection and/or diagnosis of SARS-CoV-2 by  FDA under an Emergency Use Authorization (EUA). This EUA will remain  in effect (meaning this test can be used) for the duration of the  Covid-19 declaration under Section 564(b)(1) of the Act, 21  U.S.C. section 360bbb-3(b)(1), unless the authorization is  terminated or revoked. Performed at [facility], [place], [address], [place], [place] [address]   MRSA PCR Screening     Status: None   Collection Time: 03/16/20 12:30 PM   Specimen: Nasopharyngeal  Result Value Ref Range Status   MRSA by PCR NEGATIVE NEGATIVE Final    Comment:        The GeneXpert MRSA Assay (FDA approved for NASAL specimens only), is one component of a comprehensive MRSA colonization surveillance program. It is not intended to diagnose MRSA infection nor to guide or monitor treatment for MRSA infections. Performed at [facility], [address], [place], [place] [address]     Radiology Reports CT ANGIO CHEST PE W OR WO CONTRAST  Result Date: 03/08/2020 CLINICAL DATA:  Hypoxia and peripheral edema EXAM: CT ANGIOGRAPHY CHEST WITH CONTRAST TECHNIQUE: Multidetector CT imaging of the chest was performed using the standard protocol during bolus administration of intravenous contrast. Multiplanar CT image  reconstructions and MIPs were obtained to evaluate the vascular anatomy. CONTRAST:  OMNIPAQUE IOHEXOL 350 MG/ML SOLN COMPARISON:  Chest x-ray from earlier in the same day, CT from 04/18/2004. FINDINGS: Cardiovascular: Thoracic aorta and its branches demonstrate atherosclerotic calcifications. No aneurysmal dilatation or dissection is seen. Coronary calcifications are noted. No significant cardiac enlargement is seen. The pulmonary artery shows a normal branching pattern without intraluminal filling defect to suggest pulmonary embolism. Mediastinum/Nodes: Thoracic inlet is within normal limits. No hilar or mediastinal adenopathy is noted. The esophagus as visualized is within normal limits. Lungs/Pleura: Lungs are well aerated bilaterally. A 5 mm nodule is noted in the medial aspect of the left upper lobe best seen on image number 21 of series 6. This has increased in size from the prior exam sixteen years previous at which time it measured 1-2 mm elevation of the right hemidiaphragm is seen. This is stable in appearance from the prior exam. No other nodules are seen. Small right pleural effusion is noted with right basilar atelectasis. Upper Abdomen: Visualized abdomen shows no acute abnormality. Musculoskeletal: Changes of anasarca are noted peripherally consistent with that described in the lower extremities. Degenerative changes of the thoracic spine are seen. No acute compression deformity is noted. Review of the MIP images confirms the above findings. IMPRESSION: No evidence of pulmonary emboli. Small right pleural effusion with right basilar atelectasis. 5 mm nodule in the left upper lobe increased in size from 16 years ago at which time it measured 1-2 mm. No follow-up needed if patient is low-risk. Non-contrast chest CT can be considered in 12 months if patient is high-risk. This recommendation follows the consensus statement: Guidelines for Management of Incidental Pulmonary Nodules Detected on CT  Images: From the Fleischner Society 2017; Radiology 2017; [id number]. Changes of anasarca. Aortic Atherosclerosis (ICD10-I70.0). Electronically Signed   By: Alcide Clever M.D.   On: 03/08/2020 20:33   CT ABDOMEN PELVIS W CONTRAST  Result Date: 03/14/2020 CLINICAL DATA:  Lower gastrointestinal bleed. EXAM: CT ABDOMEN AND PELVIS WITH CONTRAST TECHNIQUE: Multidetector CT imaging of the abdomen and pelvis was performed using the standard protocol following bolus administration of intravenous contrast. CONTRAST:  OMNIPAQUE IOHEXOL 300 MG/ML  SOLN COMPARISON:  None. FINDINGS: Lower chest: Small pleural effusions bilaterally, larger on the right. Subpleural compressive atelectasis in both lungs. Right hemidiaphragm elevation. Mild cardiomegaly. Four-vessel moderate  to severe coronary calcification most pronounced along the LAD. Pulmonary artery hypertension, the pulmonary trunk measuring 4.6 cm diameter. Tiny paraesophageal hernia. Hepatobiliary: Serpiginous lobular enhancement in the medial right hepatic dome measuring up to 24 mm diameter and isoenhancing with the hepatic vasculature, likely intraparenchymal varices. Fatty infiltration of the hepatic parenchyma. Normal gallbladder and biliary tree. Pancreas: Mild fatty infiltration. No acute pancreatitis. No apparent dilatation of the main pancreatic duct. Spleen: A 12 mm ovoid enhancement within the splenic parenchyma. Adrenals/Urinary Tract: Normal bilateral adrenal glands. Simple appearing bilateral renal cortical cyst measuring up to 2.5 cm on the left and 17 Hounsfield units. No right or left hydronephrosis. Normal bilateral ureters. Symmetrical mild bilateral perinephric inflammatory change, likely sequela of medical renal disease. A nonobstructing 4 mm left renal lower pole calculus. No apparent urinary bladder abnormality. Stomach/Bowel: Large stool burden without bowel obstruction. Normal appendix, coronal series 5, image 32. Diffuse mild  circumferential rectal wall thickening with inflammatory change in the perirectal fat planes and small amount of 5 Hounsfield unit free fluid in the pre sacral space. Apparent short segment mild mucosal thickening of a left lower abdominal quadrant jejunal bowel loop, coronal series 5, image 31, and apparent diffuse gastric mucosal thickening which could be due entirely to nondistention by enteric contrast. No apparent colonic wall thickening. Mild sigmoid diverticulosis coli. No apparent colonic wall thickening. Redundant sigmoid colon with adhesion formation to the right upper and lower abdominal walls, but no volvulus. Reflux of stool into the terminal ileum. No convincing evidence for contrast extravasation into the colonic bowel lumen. Vascular/Lymphatic: Aorto bi-iliac calcified atherosclerosis without an abdominal aorta aneurysm. Nonspecific bilateral inguinal, retroperitoneal and mesenteric lymph nodes. Reproductive: Scoliosis and moderate skeletal degenerative changes. Other: Small periumbilical and bilateral inguinal herniations of fat without strangulation or incarceration. Extensive anasarca above and below the waist. Musculoskeletal: Scoliosis and moderate skeletal degenerative changes. Mild diffuse bone demineralization. Grade 1 anterolisthesis of L5 on S1, likely degenerative. IMPRESSION: An acute mild uncomplicated proctitis with small amount of free fluid in the presacral space and edema in the perirectal fat plane without perforation or abscess. Large stool burden throughout the colon. Sigmoid diverticulosis without acute diverticulitis or contrast extravasation within the colonic lumen. Apparent mild mucosal thickening of the gastric lumen and a left lower abdominal quadrant small bowel loop, which could be secondary to incomplete distension or a mild gastroenteritis. Abnormal enhancement in the medial right hepatic dome and in the splenic parenchyma, possibly intrahepatic varices and splenic  hemangioma, new or not apparent on the March 08, 2020 and April 18, 2004 CT chest studies. Mild cardiomegaly with four-vessel moderate to severe coronary calcifications, small bilateral pleural effusions and bibasilar compressive atelectasis. Extensive anasarca. Nonobstructing small left renal calculus. Aortic calcified atherosclerosis. Pulmonary artery hypertension. Electronically Signed   By: Laurence Ferrari   On: 03/14/2020 18:17   US Venous Img Upper Uni Left (DVT)  Result Date: 03/09/2020 CLINICAL DATA:  Left upper extremity edema.  Evaluate for DVT. EXAM: LEFT UPPER EXTREMITY VENOUS DOPPLER ULTRASOUND TECHNIQUE: Gray-scale sonography with graded compression, as well as color Doppler and duplex ultrasound were performed to evaluate the upper extremity deep venous system from the level of the subclavian vein and including the jugular, axillary, basilic, radial, ulnar and upper cephalic vein. Spectral Doppler was utilized to evaluate flow at rest and with distal augmentation maneuvers. COMPARISON:  None. FINDINGS: Contralateral Subclavian Vein: Respiratory phasicity is normal and symmetric with the symptomatic side. No evidence of thrombus. Normal compressibility. Internal Jugular Vein:  No evidence of thrombus. Normal compressibility, respiratory phasicity and response to augmentation. Subclavian Vein: No evidence of thrombus. Normal compressibility, respiratory phasicity and response to augmentation. Axillary Vein: No evidence of thrombus. Normal compressibility, respiratory phasicity and response to augmentation. Cephalic Vein: No evidence of thrombus. Normal compressibility, respiratory phasicity and response to augmentation. Basilic Vein: No evidence of thrombus. Normal compressibility, respiratory phasicity and response to augmentation. Brachial Veins: No evidence of thrombus. Normal compressibility, respiratory phasicity and response to augmentation. Radial Veins: No evidence of thrombus. Normal  compressibility, respiratory phasicity and response to augmentation. Ulnar Veins: No evidence of thrombus. Normal compressibility, respiratory phasicity and response to augmentation. Venous Reflux:  None visualized. Other Findings: There is a moderate amount of subcutaneous edema at the level of the left arm and forearm. IMPRESSION: No evidence of DVT within the left upper extremity. Electronically Signed   By: Simonne Come M.D.   On: 03/09/2020 16:16   DG CHEST PORT 1 VIEW  Result Date: 03/13/2020 CLINICAL DATA:  Dyspnea EXAM: PORTABLE CHEST 1 VIEW COMPARISON:  03/08/2020 chest radiograph. FINDINGS: Low lung volumes. Stable cardiomediastinal silhouette with top-normal heart size. No pneumothorax. Stable elevation of the right hemidiaphragm. Blunting of the right costophrenic angle. No left pleural effusion. No overt pulmonary edema. Right basilar hazy opacity. IMPRESSION: Low lung volumes. Stable elevation of the right hemidiaphragm. Hazy right basilar lung opacity, which could represent atelectasis, aspiration and/or pneumonia. New blunting of the right costophrenic angle, cannot exclude small right pleural effusion. Electronically Signed   By: Delbert Phenix M.D.   On: 03/13/2020 13:47   DG Chest Portable 1 View  Result Date: 03/08/2020 CLINICAL DATA:  [facility] EXAM: PORTABLE CHEST 1 VIEW COMPARISON:  2018 FINDINGS: Stable elevation of the right hemidiaphragm. No new consolidation or edema. No significant pleural effusion. Stable cardiomediastinal contours. IMPRESSION: Stable significant elevation of the right hemidiaphragm. No acute finding. Electronically Signed   By: Guadlupe Spanish M.D.   On: 03/08/2020 16:30   ECHOCARDIOGRAM COMPLETE  Result Date: 03/09/2020    ECHOCARDIOGRAM REPORT   Patient Name:   BUBBER ROTHERT Date of Exam: 03/09/2020 Medical Rec #:  [id number]       Height:       66.0 in Accession #:    [id number]      Weight:       237.0 lb Date of Birth:  20-Sep-1933      BSA:          2.149 m  Patient Age:    86 years        BP:           106/83 mmHg Patient Gender: M               HR:           97 bpm. [facility] Procedure: 2D Echo Indications:    Swelling of both lower extremities  History:        Patient has no prior history of Echocardiogram examinations.                 Arrythmias:Atrial Fibrillation; Risk Factors:Former Smoker and                 Hypertension. Cellulitis.  Sonographer:    Jeryl Columbia RDCS (AE) Referring Phys: [id number] [address] [facility] IMPRESSIONS  1. Left ventricular ejection fraction, by estimation, is 60 to 65%. The left ventricle has normal function. The left ventricle has no regional wall motion abnormalities. There  is mild concentric left ventricular hypertrophy. Left ventricular diastolic parameters were normal. There is the interventricular septum is flattened in systole, consistent with right ventricular pressure overload.  2. Right ventricular systolic function is mildly reduced. The right ventricular size is mildly enlarged. There is moderately elevated pulmonary artery systolic pressure.  3. Right atrial size was mildly dilated.  4. The mitral valve is degenerative. Mild mitral valve regurgitation.  5. Tricuspid valve regurgitation is moderate.  6. The aortic valve is tricuspid. Aortic valve regurgitation is not visualized. Mild aortic valve sclerosis is present, with no evidence of aortic valve stenosis.  7. The inferior vena cava is dilated in size with <50% respiratory variability, suggesting right atrial pressure of 15 mmHg. FINDINGS  Left Ventricle: Left ventricular ejection fraction, by estimation, is 60 to 65%. The left ventricle has normal function. The left ventricle has no regional wall motion abnormalities. The left ventricular internal cavity size was normal in size. There is  mild concentric left ventricular hypertrophy. The interventricular septum is flattened in systole, consistent with right ventricular pressure overload. Left  ventricular diastolic parameters were normal. Right Ventricle: The right ventricular size is mildly enlarged. No increase in right ventricular wall thickness. Right ventricular systolic function is mildly reduced. There is moderately elevated pulmonary artery systolic pressure. The tricuspid regurgitant velocity is 2.96 m/s, and with an assumed right atrial pressure of 15 mmHg, the estimated right ventricular systolic pressure is 50.0 mmHg. Left Atrium: Left atrial size was normal in size. Right Atrium: Right atrial size was mildly dilated. Pericardium: There is no evidence of pericardial effusion. Mitral Valve: The mitral valve is degenerative in appearance. There is mild thickening of the mitral valve leaflet(s). Moderate mitral annular calcification. Mild mitral valve regurgitation. Tricuspid Valve: The tricuspid valve is grossly normal. Tricuspid valve regurgitation is moderate. Aortic Valve: The aortic valve is tricuspid. . There is mild thickening and mild calcification of the aortic valve. Aortic valve regurgitation is not visualized. Mild aortic valve sclerosis is present, with no evidence of aortic valve stenosis. Mild to moderate aortic valve annular calcification. There is mild thickening of the aortic valve. There is mild calcification of the aortic valve. Pulmonic Valve: The pulmonic valve was grossly normal. Pulmonic valve regurgitation is mild. Aorta: The aortic root is normal in size and structure. Venous: The inferior vena cava is dilated in size with less than 50% respiratory variability, suggesting right atrial pressure of 15 mmHg. IAS/Shunts: The interatrial septum was not well visualized.  LEFT VENTRICLE PLAX 2D LVIDd:         4.19 cm  Diastology LVIDs:         2.49 cm  LV e' lateral:   13.60 cm/s LV PW:         1.29 cm  LV E/e' lateral: 8.0 LV IVS:        1.12 cm  LV e' medial:    10.10 cm/s LVOT diam:     2.00 cm  LV E/e' medial:  10.8 LVOT Area:     3.14 cm  RIGHT VENTRICLE TAPSE (M-mode):  1.3 cm LEFT ATRIUM             Index       RIGHT ATRIUM           Index LA diam:        5.00 cm 2.33 cm/m  RA Area:     22.40 cm LA Vol (A2C):   48.2 ml 22.43 ml/m RA Volume:   63.50  ml  29.54 ml/m LA Vol (A4C):   44.5 ml 20.70 ml/m LA Biplane Vol: 46.6 ml 21.68 ml/m   AORTA Ao Root diam: 2.90 cm MITRAL VALVE                TRICUSPID VALVE MV Area (PHT): 3.58 cm     TR Peak grad:   35.0 mmHg MV Decel Time: 212 msec     TR Vmax:        296.00 cm/s MR Peak grad: 46.2 mmHg MR Mean grad: 25.0 mmHg     SHUNTS MR Vmax:      340.00 cm/s   Systemic Diam: 2.00 cm MR Vmean:     236.0 cm/s MV E velocity: 108.67 cm/s Prentice Docker MD Electronically signed by Prentice Docker MD Signature Date/Time: 03/09/2020/12:59:08 PM    Final      CBC Recent Labs  Lab 03/11/20 1610 03/13/20 9604 03/14/20 1539 03/15/20 0553 03/16/20 0607  WBC 16.4* 13.8* 19.4* 14.9* 11.8*  HGB 16.0 15.1 16.7 16.2 15.3  HCT 50.8 49.4 52.7* 51.3 49.5  PLT 100* 85* 87* 77* 83*  MCV 100.6* 103.1* 101.3* 101.8* 102.7*  MCH 31.7 31.5 32.1 32.1 31.7  MCHC 31.5 30.6 31.7 31.6 30.9  RDW 19.6* 19.1* 19.3* 19.5* 19.8*  LYMPHSABS  --   --  0.9  --   --   MONOABS  --   --  1.8*  --   --   EOSABS  --   --  0.2  --   --   BASOSABS  --   --  0.0  --   --     Chemistries  Recent Labs  Lab 03/11/20 0427 03/13/20 0627 03/14/20 1539 03/17/20 0555  NA 142 140 140 138  K 3.6 3.7 3.9 3.8  CL 96* 93* 94* 98  CO2 36* 40* 39* 34*  GLUCOSE 86 94 114* 82  BUN 26* CREATININE 1.14 0.87 0.74 0.67  CALCIUM 8.5* 8.1* 8.3* 8.2*  MG 2.3  --   --  1.9  AST  --   --  23 28  ALT  --   --  21 23  ALKPHOS  --   --  74 61  BILITOT  --   --  2.8* 3.8*   ------------------------------------------------------------------------------------------------------------------ No results for input(s): CHOL, HDL, LDLCALC, TRIG, CHOLHDL, LDLDIRECT in the last 72 hours.  No results found for:  HGBA1C ------------------------------------------------------------------------------------------------------------------ No results for input(s): TSH, T4TOTAL, T3FREE, THYROIDAB in the last 72 hours.  Invalid input(s): FREET3 ------------------------------------------------------------------------------------------------------------------ No results for input(s): VITAMINB12, FOLATE, FERRITIN, TIBC, IRON, RETICCTPCT in the last 72 hours.  Coagulation profile Recent Labs  Lab 03/14/20 1539  INR 1.6*    No results for input(s): DDIMER in the last 72 hours.  Cardiac Enzymes No results for input(s): CKMB, TROPONINI, MYOGLOBIN in the last 168 hours.  Invalid input(s): CK ------------------------------------------------------------------------------------------------------------------    Component Value Date/Time   BNP 280.0 (H) 03/08/2020 1535     Shon Hale M.D on 03/17/2020 at 2:46 PM  Go to [email or web address] - for contact info  [facility] - Office  [phone]

## 2020-03-17 NOTE — Transfer of Care (Addendum)
Immediate Anesthesia Transfer of Care Note  Patient: Jesus Fowler  Procedure(s) Performed: ESOPHAGOGASTRODUODENOSCOPY (EGD) WITH PROPOFOL (N/A ) ESOPHAGEAL DILATION (N/A ) FLEXIBLE SIGMOIDOSCOPY WITH PROPOFOL (N/A ) BIOPSY  Patient Location: PACU  Anesthesia Type:General  Level of Consciousness: awake, alert , oriented and patient cooperative  Airway & Oxygen Therapy: Patient Spontanous Breathing and patient remains on Bipap  Post-op Assessment: Report given to RN and Post -op Vital signs reviewed and stable  Post vital signs: Reviewed and stable  Last Vitals:  Vitals Value Taken Time  BP 97/59 03/17/20 1032  Temp    Pulse 93 03/17/20 1035  Resp 10 03/17/20 1035  SpO2 99 % 03/17/20 1035  Vitals shown include unvalidated device data.  Last Pain:  Vitals:   03/17/20 0950  TempSrc:   PainSc: 0-No pain      Patients Stated Pain Goal: 5 (03/17/20 0826)  Complications: No apparent anesthesia complications

## 2020-03-17 NOTE — Anesthesia Procedure Notes (Signed)
Procedure Name: MAC Date/Time: 03/17/2020 9:48 AM Performed by: Jonna Munro, CRNA Pre-anesthesia Checklist: Patient identified, Emergency Drugs available, Suction available and Patient being monitored Oxygen Delivery Method: Supernova nasal CPAP Placement Confirmation: positive ETCO2

## 2020-03-17 NOTE — Interval H&P Note (Signed)
History and Physical Interval Note:  03/17/2020 9:30 AM  Jesus Fowler  has presented today for surgery, with the diagnosis of dysphagia, early satiety, abnormal stomach on CT, proctitis on CT, rectal bleeding.  The various methods of treatment have been discussed with the patient and family. After consideration of risks, benefits and other options for treatment, the patient has consented to  Procedure(s): ESOPHAGOGASTRODUODENOSCOPY (EGD) WITH PROPOFOL (N/A) ESOPHAGEAL DILATION (N/A) FLEXIBLE SIGMOIDOSCOPY WITH PROPOFOL (N/A) as a surgical intervention.  The patient's history has been reviewed, patient examined, no change in status, stable for surgery.  I have reviewed the patient's chart and labs.  Questions were answered to the patient's satisfaction.     [name]  Patient seen in short stay.  Stable overnight.  H&H morning 15.3/49.5. Discussed EGD with possible esophageal dilation and sigmoidoscopy with patient and son (at the bedside) at length. The risks, benefits, limitations, alternatives and imponderables have been reviewed with the patient. Potential for esophageal dilation, biopsy, etc. have also been reviewed.  Questions have been answered. All parties agreeable.   Further recommendations to follow.

## 2020-03-18 ENCOUNTER — Inpatient Hospital Stay
Admission: RE | Admit: 2020-03-18 | Discharge: 2020-04-23 | Disposition: A | Discharge status: Home / Self Care | Payer: Medicare Other | Source: Ambulatory Visit | Attending: Internal Medicine | Admitting: Internal Medicine

## 2020-03-18 ENCOUNTER — Encounter: Payer: Self-pay | Admitting: Internal Medicine

## 2020-03-18 ENCOUNTER — Other Ambulatory Visit: Payer: Self-pay

## 2020-03-18 LAB — BASIC METABOLIC PANEL
Anion gap: 7 (ref 5–15)
BUN: 23 mg/dL (ref 8–23)
CO2: 35 mmol/L — ABNORMAL HIGH (ref 22–32)
Calcium: 9.1 mg/dL (ref 8.9–10.3)
Chloride: 98 mmol/L (ref 98–111)
Creatinine, Ser: 0.7 mg/dL (ref 0.61–1.24)
GFR calc Af Amer: >60 mL/min (ref >60)
GFR calc non Af Amer: >60 mL/min (ref >60)
Glucose, Bld: 105 mg/dL — ABNORMAL HIGH (ref 70–99)
Potassium: 4.3 mmol/L (ref 3.5–5.1)
Sodium: 140 mmol/L (ref 135–145)

## 2020-03-18 LAB — CBC
HCT: 50.7 % (ref 39.0–52.0)
Hemoglobin: 15.9 g/dL (ref 13.0–17.0)
MCH: 31.8 pg (ref 26.0–34.0)
MCHC: 31.4 g/dL (ref 30.0–36.0)
MCV: 101.4 fL — ABNORMAL HIGH (ref 80.0–100.0)
Platelets: 84 10*3/uL — ABNORMAL LOW (ref 150–400)
RBC: 5 MIL/uL (ref 4.22–5.81)
RDW: 19.5 % — ABNORMAL HIGH (ref 11.5–15.5)
WBC: 10.4 10*3/uL (ref 4.0–10.5)
nRBC: 0 % (ref 0.0–0.2)

## 2020-03-18 LAB — RESPIRATORY PANEL BY RT PCR (FLU A&B, COVID)
Influenza A by PCR: NEGATIVE
Influenza B by PCR: NEGATIVE
SARS Coronavirus 2 by RT PCR: NEGATIVE

## 2020-03-18 LAB — SURGICAL PATHOLOGY

## 2020-03-18 MED ORDER — TAMSULOSIN HCL 0.4 MG PO CAPS: 0.4000 mg | ORAL_CAPSULE | Freq: Every day | ORAL | 1 refills | Status: DC

## 2020-03-18 MED ORDER — HYDROCODONE-ACETAMINOPHEN 5-325 MG PO TABS: 1.0000 | ORAL_TABLET | Freq: Four times a day (QID) | ORAL | 0 refills | Status: DC | PRN

## 2020-03-18 MED ORDER — LINACLOTIDE 145 MCG PO CAPS: 145.0000 ug | ORAL_CAPSULE | Freq: Every day | ORAL | 6 refills | Status: DC

## 2020-03-18 MED ORDER — PANTOPRAZOLE SODIUM 40 MG PO TBEC: 40.0000 mg | DELAYED_RELEASE_TABLET | Freq: Every day | ORAL | 2 refills | Status: DC

## 2020-03-18 MED ORDER — ERYTHROMYCIN 5 MG/GM OP OINT: TOPICAL_OINTMENT | Freq: Three times a day (TID) | OPHTHALMIC | 0 refills | Status: DC

## 2020-03-18 MED ORDER — POLYETHYLENE GLYCOL 3350 17 G PO PACK: 17.0000 g | PACK | Freq: Two times a day (BID) | ORAL | 3 refills | Status: DC

## 2020-03-18 MED ORDER — VITAMIN D3 125 MCG (5000 UT) PO CAPS: 1.0000 | ORAL_CAPSULE | Freq: Every day | ORAL | 1 refills | Status: DC

## 2020-03-18 NOTE — Addendum Note (Signed)
Addendum  created 03/18/20 0940 by Moshe Salisbury, CRNA   Clinical Note Signed

## 2020-03-18 NOTE — Plan of Care (Signed)

## 2020-03-18 NOTE — Discharge Summary (Signed)
Jesus Fowler, is a 86 y.o. male  DOB 23-Dec-1933  MRN [id number].  Admission date:  03/14/2020  Admitting Physician  Jacques Navy, MD  Discharge Date:  03/18/2020   Primary MD  [name], MD  Recommendations for primary care physician for things to follow:   1)Discharge to SNF rehab on 2 L of oxygen via nasal cannula continuously 2)Please use CPAP nightly 3) CBC and BMP test every Thursday for 2 weeks 4)Patient is on Eliquis/Apixban so Avoid ibuprofen/Advil/Aleve/Motrin/Goody Powders/Naproxen/BC powders/Meloxicam/Diclofenac/Indomethacin and other Nonsteroidal anti-inflammatory medications as these will make you more likely to bleed and can cause stomach ulcers, can also cause Kidney problems.  5)Dysphagia 2 (Fine chop);Thin liquid, Pt benefits from liquid wash during PO consumption--- 6)Avoid constipation 7)Please continue wound care/dressing changes to lower extremities once to twice daily  Admission Diagnosis  Hematochezia [K92.1] Proctitis [K62.89] Rectal bleeding [K62.5]   Discharge Diagnosis  Hematochezia [K92.1] Proctitis [K62.89] Rectal bleeding [K62.5]    Active Problems:   Atrial fibrillation, chronic (HCC)   Acute on chronic heart failure with preserved ejection fraction (HFpEF) /Diastolic Dysfunction   Swelling of both lower extremities   OSA on CPAP   Hematochezia   Proctitis   Rectal bleeding   Dysphagia   Early satiety      Past Medical History:  Diagnosis Date  . A-fib (HCC)   . Cellulitis   . Hemorrhoid   . Hypertension   . Prostate disorder     Past Surgical History:  Procedure Laterality Date  . BIOPSY  03/17/2020   Procedure: BIOPSY;  Surgeon: Corbin Ade, MD;  [facility];  Service: Endoscopy;;  gastric rectal  . ESOPHAGEAL DILATION N/A 03/17/2020   Procedure: ESOPHAGEAL DILATION;  Surgeon: Corbin Ade, MD;  [facility];  Service: Endoscopy;  Laterality: N/A;  . ESOPHAGOGASTRODUODENOSCOPY (EGD) WITH PROPOFOL N/A 03/17/2020   Procedure: ESOPHAGOGASTRODUODENOSCOPY (EGD) WITH PROPOFOL;  Surgeon: Corbin Ade, MD;  [facility];  Service: Endoscopy;  Laterality: N/A;  . FLEXIBLE SIGMOIDOSCOPY N/A 03/17/2020   Procedure: FLEXIBLE SIGMOIDOSCOPY WITH PROPOFOL;  Surgeon: Corbin Ade, MD;  [facility];  Service: Endoscopy;  Laterality: N/A;  . HEMORRHOID SURGERY     patient reports several hemorrhoid surgeries in the past     HPI  from the history and physical done on the day of admission:    PCP: [name], MD   Patient coming from: SNF  I have personally briefly reviewed patient's old medical records in [facility]  Chief Complaint: Rectal bleeding  HPI: Jesus Fowler is a 86 y.o. male with medical history significant of recent hospitalization for anasarca and cellulitis of the UE. He has chronic diastolic HFpEF and responded to diuretic therapy. He was treated for cellulitis with abx and d/c'd today on augmentin. At the SNF facility he was found by admitting APP to have rectal bleeding. He was sent to [facility] for evaluation.  ED Course: Hemodynamically stable. EDP on exam found frank blood  in the rectal vault on digital exam. Lab revealed stable Hgb, leukocytosis. CT revealed proctititis and sigmoid diverticulosis w/o evidence of inflammation. GI consulted who will see 3/16  [facility]   Review of Systems: As per HPI otherwise 10 point review of systems negative.     Hospital Course:    Brief Summary -86 y.o.malewith medical history significant foratrial fibrillation, hypertension readmitted on 03/14/2020 after being discharged same date to SNF rehab with episode of rectal bleeding -Rectal bleeding this is a new finding and new diagnosis for patient (was not present during recent hospitalization) -EGD and flexible sigmoidoscopy  completed 03/17/2020 -  return to SNF rehab   A/p  1)Rectal bleeding/painless hematochezia --- GI input appreciated H&H is stable, -Hemoglobin is 15.9 -CT abdomen and pelvis suggest diverticulosis of the sigmoid colon - flexible sigmoidoscopy  on 03/17/2020 showed distal rectal laceration with thyroid zone, presumed secondary to pressure injury from constipation-biopsy result pending -, no further significant rectal bleeding -Significant amount of retained stool noted in the lumen of the sigmoid colon and rectum during flexible sigmoidoscopy -Avoid further constipation, treat with MiraLAX 1 p.o. twice daily at least for a month and then may decrease to once daily with another dose as needed if no BM in 24 hours  2)Acute Hypoxic Respiratory Failure--- suspect acute on chronic HFpEF/CHF related, BNP is 280,  --Patient is a reformed smoker so cannot exclude some component of COPD -CTA chest w/o acute PE, small right pleural effusion and right-sided atelectasis noted --... Currently requiring 2 L of oxygen via nasal cannula -PTA patient was not on home O2 -Echo with EF of 60 to 65% -Recently aggressively diuresed with IV Lasix --weight is down to218pounds from 236.99 -Fluid balance is negative -Continue p.o. torsemide 20 mg daily -Continue CPAP with sleep  3)Left upper extremity cellulitis----resolved cellulitis,  -WBC is down to10.4 from 21.1, -- PTA patient was treated with Levaquin as outpatient, no fevers,treated withIV Rocephin, Blood Cx NGTD -Left upper extremity venous Dopplers without acute DVT Completed p.o. Augmentin    4) chronic Atrial fibrillation- rates 80s to 90s. Rate controlled --May restart Eliquis on 03/18/2020  -we already decreased Toprol-XL to12.5 mg daily from 50 mg daily due to soft BP for rate control  5)Prolonged QTC-457 --- Keep potassium close to 4 magnesium above 2  6)Elevated bilirubin-T bil 2.7 to 2.9 (indirect 1.9, direct 0.8),  --LFTs WNL -Asymptomatic at this time outpatient follow-up advised.  7)Hypertension--- BP soft despite discontinuation of lisinopril/HCTZ and amlodipine, decreased Toprol-XL to12.5 mg daily from 50 mg daily  8)BPH--- continue Flomax  9)Dysphagia-- Speech eval appreciated, recommends---Dysphagia 2 (Fine chop);Thin liquid, Pt benefits from liquid wash during PO consumption. - EGD with dilatation on 03/17/2020 with evidence of portal hypertensive gastropathy and antral erosions -PPI as prescribed -Patient tolerating oral intake better after EGD with dilatation on 03/17/2020  10)Generalized Weakness and Deconditioning-TSH 0.4, - discharge back to SNF -PT reevaluation appreciated  Disposition-- discharge to SNF for rehab  -Patient is medically ready for discharge  Code Status:Full  Family Communication: (patient is alert, awake and coherent) Discussed withson who is visiting from [place] and wife   Consults :GI  Discharge Condition: Stable  Follow UP--PCP post discharge as advised   Diet and Activity recommendation:  As advised  Discharge Instructions    Discharge Instructions    Call MD for:  difficulty breathing, headache or visual disturbances   Complete by: As directed    Call MD for:  extreme fatigue  Complete by: As directed    Call MD for:  persistant dizziness or light-headedness   Complete by: As directed    Call MD for:  persistant nausea and vomiting   Complete by: As directed    Call MD for:  severe uncontrolled pain   Complete by: As directed    Call MD for:  temperature >100.4   Complete by: As directed    Diet - low sodium heart healthy   Complete by: As directed    Discharge instructions   Complete by: As directed    1)Discharge to SNF rehab on 2 L of oxygen via nasal cannula continuously 2)Please use CPAP nightly 3) CBC and BMP test every Thursday for 2 weeks 4)Patient is on Eliquis/Apixban so Avoid ibuprofen/Advil/Aleve/Motrin/Goody  Powders/Naproxen/BC powders/Meloxicam/Diclofenac/Indomethacin and other Nonsteroidal anti-inflammatory medications as these will make you more likely to bleed and can cause stomach ulcers, can also cause Kidney problems.  5)Dysphagia 2 (Fine chop);Thin liquid, Pt benefits from liquid wash during PO consumption--- 6)Avoid constipation 7)Please continue wound care/dressing changes to lower extremities once to twice daily   Increase activity slowly   Complete by: As directed         Discharge Medications     Allergies as of 03/18/2020   No Known Allergies     Medication List    STOP taking these medications   amoxicillin-clavulanate 875-125 MG tablet Commonly known as: AUGMENTIN     TAKE these medications   acetaminophen 325 MG tablet Commonly known as: TYLENOL Take 2 tablets (650 mg total) by mouth every 6 (six) hours as needed for mild pain, fever or headache (or Fever >/= 101).   albuterol 108 (90 Base) MCG/ACT inhaler Commonly known as: VENTOLIN HFA Inhale 2 puffs into the lungs every 6 (six) hours as needed for wheezing or shortness of breath.   ascorbic acid 500 MG tablet Commonly known as: VITAMIN C Take 1,000 mg by mouth daily.   Eliquis 5 MG Tabs tablet Generic drug: apixaban Take 2.5 mg by mouth 2 (two) times daily.   erythromycin ophthalmic ointment Place into both eyes 3 (three) times daily for 3 days.   feeding supplement (ENSURE ENLIVE) Liqd Take 237 mLs by mouth 2 (two) times daily between meals.   FISH OIL BURP-LESS PO Take 2 capsules by mouth daily.   HYDROcodone-acetaminophen 5-325 MG tablet Commonly known as: NORCO/VICODIN Take 1 tablet by mouth every 6 (six) hours as needed for moderate pain.   hydrocortisone cream 1 % Apply 1 application topically 3 (three) times daily as needed for itching (minor skin irritation).   hydrOXYzine 25 MG tablet Commonly known as: ATARAX/VISTARIL Take 1 tablet (25 mg total) by mouth 3 (three) times daily as  needed for anxiety.   linaclotide 145 MCG Caps capsule Commonly known as: LINZESS Take 1 capsule (145 mcg total) by mouth daily before breakfast. Start taking on: March 19, 2020   metoprolol succinate 25 MG 24 hr tablet Commonly known as: TOPROL-XL Take 0.5 tablets (12.5 mg total) by mouth daily.   NON FORMULARY May use CPAP from home with home settings. At Bedtime   NON FORMULARY Dysphagia 2 thin liquids   OXYGEN Inhale 2 L into the lungs continuous.   pantoprazole 40 MG tablet Commonly known as: PROTONIX Take 1 tablet (40 mg total) by mouth daily. Start taking on: March 19, 2020   polyethylene glycol 17 g packet Commonly known as: MIRALAX / GLYCOLAX Take 17 g by mouth 2 (two) times daily.  What changed: when to take this   potassium chloride 10 MEQ tablet Commonly known as: KLOR-CON Take 10 mEq by mouth daily.   RISA-BID PROBIOTIC PO Take 1 capsule by mouth daily.   rosuvastatin 10 MG tablet Commonly known as: CRESTOR Take 10 mg by mouth in the morning.   senna-docusate 8.6-50 MG tablet Commonly known as: Senokot-S Take 2 tablets by mouth at bedtime.   sodium chloride 0.65 % Soln nasal spray Commonly known as: OCEAN Place 2 sprays into both nostrils 3 (three) times daily.   tamsulosin 0.4 MG Caps capsule Commonly known as: FLOMAX Take 1 capsule (0.4 mg total) by mouth daily after supper. What changed: when to take this   torsemide 20 MG tablet Commonly known as: Demadex Take 1 tablet (20 mg total) by mouth daily.   Vitamin D3 125 MCG (5000 UT) Caps Take 1 capsule (5,000 Units total) by mouth daily. What changed: how much to take   Vitamin E 400 units Tabs Take 2 tablets by mouth daily.       Major procedures and Radiology Reports - PLEASE review detailed and final reports for all details, in brief -   CT ANGIO CHEST PE W OR WO CONTRAST  Result Date: 03/08/2020 CLINICAL DATA:  Hypoxia and peripheral edema EXAM: CT ANGIOGRAPHY CHEST WITH CONTRAST  TECHNIQUE: Multidetector CT imaging of the chest was performed using the standard protocol during bolus administration of intravenous contrast. Multiplanar CT image reconstructions and MIPs were obtained to evaluate the vascular anatomy. CONTRAST:  OMNIPAQUE IOHEXOL 350 MG/ML SOLN COMPARISON:  Chest x-ray from earlier in the same day, CT from 04/18/2004. FINDINGS: Cardiovascular: Thoracic aorta and its branches demonstrate atherosclerotic calcifications. No aneurysmal dilatation or dissection is seen. Coronary calcifications are noted. No significant cardiac enlargement is seen. The pulmonary artery shows a normal branching pattern without intraluminal filling defect to suggest pulmonary embolism. Mediastinum/Nodes: Thoracic inlet is within normal limits. No hilar or mediastinal adenopathy is noted. The esophagus as visualized is within normal limits. Lungs/Pleura: Lungs are well aerated bilaterally. A 5 mm nodule is noted in the medial aspect of the left upper lobe best seen on image number 21 of series 6. This has increased in size from the prior exam sixteen years previous at which time it measured 1-2 mm elevation of the right hemidiaphragm is seen. This is stable in appearance from the prior exam. No other nodules are seen. Small right pleural effusion is noted with right basilar atelectasis. Upper Abdomen: Visualized abdomen shows no acute abnormality. Musculoskeletal: Changes of anasarca are noted peripherally consistent with that described in the lower extremities. Degenerative changes of the thoracic spine are seen. No acute compression deformity is noted. Review of the MIP images confirms the above findings. IMPRESSION: No evidence of pulmonary emboli. Small right pleural effusion with right basilar atelectasis. 5 mm nodule in the left upper lobe increased in size from 16 years ago at which time it measured 1-2 mm. No follow-up needed if patient is low-risk. Non-contrast chest CT can be considered in  12 months if patient is high-risk. This recommendation follows the consensus statement: Guidelines for Management of Incidental Pulmonary Nodules Detected on CT Images: From the Fleischner Society 2017; Radiology 2017; 284:228-243. Changes of anasarca. Aortic Atherosclerosis (ICD10-I70.0). Electronically Signed   By: Alcide Clever M.D.   On: 03/08/2020 20:33   CT ABDOMEN PELVIS W CONTRAST  Result Date: 03/14/2020 CLINICAL DATA:  Lower gastrointestinal bleed. EXAM: CT ABDOMEN AND PELVIS WITH CONTRAST TECHNIQUE: Multidetector  CT imaging of the abdomen and pelvis was performed using the standard protocol following bolus administration of intravenous contrast. CONTRAST:  161mL OMNIPAQUE IOHEXOL 300 MG/ML  SOLN COMPARISON:  None. FINDINGS: Lower chest: Small pleural effusions bilaterally, larger on the right. Subpleural compressive atelectasis in both lungs. Right hemidiaphragm elevation. Mild cardiomegaly. Four-vessel moderate to severe coronary calcification most pronounced along the LAD. Pulmonary artery hypertension, the pulmonary trunk measuring 4.6 cm diameter. Tiny paraesophageal hernia. Hepatobiliary: Serpiginous lobular enhancement in the medial right hepatic dome measuring up to 24 mm diameter and isoenhancing with the hepatic vasculature, likely intraparenchymal varices. Fatty infiltration of the hepatic parenchyma. Normal gallbladder and biliary tree. Pancreas: Mild fatty infiltration. No acute pancreatitis. No apparent dilatation of the main pancreatic duct. Spleen: A 12 mm ovoid enhancement within the splenic parenchyma. Adrenals/Urinary Tract: Normal bilateral adrenal glands. Simple appearing bilateral renal cortical cyst measuring up to 2.5 cm on the left and 17 Hounsfield units. No right or left hydronephrosis. Normal bilateral ureters. Symmetrical mild bilateral perinephric inflammatory change, likely sequela of medical renal disease. A nonobstructing 4 mm left renal lower pole calculus. No apparent  urinary bladder abnormality. Stomach/Bowel: Large stool burden without bowel obstruction. Normal appendix, coronal series 5, image 32. Diffuse mild circumferential rectal wall thickening with inflammatory change in the perirectal fat planes and small amount of 5 Hounsfield unit free fluid in the pre sacral space. Apparent short segment mild mucosal thickening of a left lower abdominal quadrant jejunal bowel loop, coronal series 5, image 31, and apparent diffuse gastric mucosal thickening which could be due entirely to nondistention by enteric contrast. No apparent colonic wall thickening. Mild sigmoid diverticulosis coli. No apparent colonic wall thickening. Redundant sigmoid colon with adhesion formation to the right upper and lower abdominal walls, but no volvulus. Reflux of stool into the terminal ileum. No convincing evidence for contrast extravasation into the colonic bowel lumen. Vascular/Lymphatic: Aorto bi-iliac calcified atherosclerosis without an abdominal aorta aneurysm. Nonspecific bilateral inguinal, retroperitoneal and mesenteric lymph nodes. Reproductive: Scoliosis and moderate skeletal degenerative changes. Other: Small periumbilical and bilateral inguinal herniations of fat without strangulation or incarceration. Extensive anasarca above and below the waist. Musculoskeletal: Scoliosis and moderate skeletal degenerative changes. Mild diffuse bone demineralization. Grade 1 anterolisthesis of L5 on S1, likely degenerative. IMPRESSION: An acute mild uncomplicated proctitis with small amount of free fluid in the presacral space and edema in the perirectal fat plane without perforation or abscess. Large stool burden throughout the colon. Sigmoid diverticulosis without acute diverticulitis or contrast extravasation within the colonic lumen. Apparent mild mucosal thickening of the gastric lumen and a left lower abdominal quadrant small bowel loop, which could be secondary to incomplete distension or a mild  gastroenteritis. Abnormal enhancement in the medial right hepatic dome and in the splenic parenchyma, possibly intrahepatic varices and splenic hemangioma, new or not apparent on the March 08, 2020 and April 18, 2004 CT chest studies. Mild cardiomegaly with four-vessel moderate to severe coronary calcifications, small bilateral pleural effusions and bibasilar compressive atelectasis. Extensive anasarca. Nonobstructing small left renal calculus. Aortic calcified atherosclerosis. Pulmonary artery hypertension. Electronically Signed   By: Revonda Humphrey   On: 03/14/2020 18:17   US Venous Img Upper Uni Left (DVT)  Result Date: 03/09/2020 CLINICAL DATA:  Left upper extremity edema.  Evaluate for DVT. EXAM: LEFT UPPER EXTREMITY VENOUS DOPPLER ULTRASOUND TECHNIQUE: Gray-scale sonography with graded compression, as well as color Doppler and duplex ultrasound were performed to evaluate the upper extremity deep venous system from the level of  the subclavian vein and including the jugular, axillary, basilic, radial, ulnar and upper cephalic vein. Spectral Doppler was utilized to evaluate flow at rest and with distal augmentation maneuvers. COMPARISON:  None. FINDINGS: Contralateral Subclavian Vein: Respiratory phasicity is normal and symmetric with the symptomatic side. No evidence of thrombus. Normal compressibility. Internal Jugular Vein: No evidence of thrombus. Normal compressibility, respiratory phasicity and response to augmentation. Subclavian Vein: No evidence of thrombus. Normal compressibility, respiratory phasicity and response to augmentation. Axillary Vein: No evidence of thrombus. Normal compressibility, respiratory phasicity and response to augmentation. Cephalic Vein: No evidence of thrombus. Normal compressibility, respiratory phasicity and response to augmentation. Basilic Vein: No evidence of thrombus. Normal compressibility, respiratory phasicity and response to augmentation. Brachial Veins: No evidence  of thrombus. Normal compressibility, respiratory phasicity and response to augmentation. Radial Veins: No evidence of thrombus. Normal compressibility, respiratory phasicity and response to augmentation. Ulnar Veins: No evidence of thrombus. Normal compressibility, respiratory phasicity and response to augmentation. Venous Reflux:  None visualized. Other Findings: There is a moderate amount of subcutaneous edema at the level of the left arm and forearm. IMPRESSION: No evidence of DVT within the left upper extremity. Electronically Signed   By: Simonne Come M.D.   On: 03/09/2020 16:16   DG CHEST PORT 1 VIEW  Result Date: 03/13/2020 CLINICAL DATA:  Dyspnea EXAM: PORTABLE CHEST 1 VIEW COMPARISON:  03/08/2020 chest radiograph. FINDINGS: Low lung volumes. Stable cardiomediastinal silhouette with top-normal heart size. No pneumothorax. Stable elevation of the right hemidiaphragm. Blunting of the right costophrenic angle. No left pleural effusion. No overt pulmonary edema. Right basilar hazy opacity. IMPRESSION: Low lung volumes. Stable elevation of the right hemidiaphragm. Hazy right basilar lung opacity, which could represent atelectasis, aspiration and/or pneumonia. New blunting of the right costophrenic angle, cannot exclude small right pleural effusion. Electronically Signed   By: Delbert Phenix M.D.   On: 03/13/2020 13:47   DG Chest Portable 1 View  Result Date: 03/08/2020 CLINICAL DATA:  Anasarca EXAM: PORTABLE CHEST 1 VIEW COMPARISON:  2018 FINDINGS: Stable elevation of the right hemidiaphragm. No new consolidation or edema. No significant pleural effusion. Stable cardiomediastinal contours. IMPRESSION: Stable significant elevation of the right hemidiaphragm. No acute finding. Electronically Signed   By: Guadlupe Spanish M.D.   On: 03/08/2020 16:30   ECHOCARDIOGRAM COMPLETE  Result Date: 03/09/2020    ECHOCARDIOGRAM REPORT   Patient Name:   Jesus Fowler Date of Exam: 03/09/2020 Medical Rec #:  [id number]        Height:       66.0 in Accession #:    [id number]      Weight:       237.0 lb Date of Birth:  08/26/1933      BSA:          2.149 m Patient Age:    86 years        BP:           106/83 mmHg Patient Gender: M               HR:           97 bpm. [facility] Procedure: 2D Echo Indications:    Swelling of both lower extremities  History:        Patient has no prior history of Echocardiogram examinations.                 Arrythmias:Atrial Fibrillation; Risk Factors:Former Smoker and  Hypertension. Cellulitis.  Sonographer:    Jeryl Columbia RDCS (AE) Referring Phys: [id number] [facility] IMPRESSIONS  1. Left ventricular ejection fraction, by estimation, is 60 to 65%. The left ventricle has normal function. The left ventricle has no regional wall motion abnormalities. There is mild concentric left ventricular hypertrophy. Left ventricular diastolic parameters were normal. There is the interventricular septum is flattened in systole, consistent with right ventricular pressure overload.  2. Right ventricular systolic function is mildly reduced. The right ventricular size is mildly enlarged. There is moderately elevated pulmonary artery systolic pressure.  3. Right atrial size was mildly dilated.  4. The mitral valve is degenerative. Mild mitral valve regurgitation.  5. Tricuspid valve regurgitation is moderate.  6. The aortic valve is tricuspid. Aortic valve regurgitation is not visualized. Mild aortic valve sclerosis is present, with no evidence of aortic valve stenosis.  7. The inferior vena cava is dilated in size with <50% respiratory variability, suggesting right atrial pressure of 15 mmHg. FINDINGS  Left Ventricle: Left ventricular ejection fraction, by estimation, is 60 to 65%. The left ventricle has normal function. The left ventricle has no regional wall motion abnormalities. The left ventricular internal cavity size was normal in size. There is  mild concentric left ventricular  hypertrophy. The interventricular septum is flattened in systole, consistent with right ventricular pressure overload. Left ventricular diastolic parameters were normal. Right Ventricle: The right ventricular size is mildly enlarged. No increase in right ventricular wall thickness. Right ventricular systolic function is mildly reduced. There is moderately elevated pulmonary artery systolic pressure. The tricuspid regurgitant velocity is 2.96 m/s, and with an assumed right atrial pressure of 15 mmHg, the estimated right ventricular systolic pressure is 50.0 mmHg. Left Atrium: Left atrial size was normal in size. Right Atrium: Right atrial size was mildly dilated. Pericardium: There is no evidence of pericardial effusion. Mitral Valve: The mitral valve is degenerative in appearance. There is mild thickening of the mitral valve leaflet(s). Moderate mitral annular calcification. Mild mitral valve regurgitation. Tricuspid Valve: The tricuspid valve is grossly normal. Tricuspid valve regurgitation is moderate. Aortic Valve: The aortic valve is tricuspid. . There is mild thickening and mild calcification of the aortic valve. Aortic valve regurgitation is not visualized. Mild aortic valve sclerosis is present, with no evidence of aortic valve stenosis. Mild to moderate aortic valve annular calcification. There is mild thickening of the aortic valve. There is mild calcification of the aortic valve. Pulmonic Valve: The pulmonic valve was grossly normal. Pulmonic valve regurgitation is mild. Aorta: The aortic root is normal in size and structure. Venous: The inferior vena cava is dilated in size with less than 50% respiratory variability, suggesting right atrial pressure of 15 mmHg. IAS/Shunts: The interatrial septum was not well visualized.  LEFT VENTRICLE PLAX 2D LVIDd:         4.19 cm  Diastology LVIDs:         2.49 cm  LV e' lateral:   13.60 cm/s LV PW:         1.29 cm  LV E/e' lateral: 8.0 LV IVS:        1.12 cm  LV e'  medial:    10.10 cm/s LVOT diam:     2.00 cm  LV E/e' medial:  10.8 LVOT Area:     3.14 cm  RIGHT VENTRICLE TAPSE (M-mode): 1.3 cm LEFT ATRIUM             Index       RIGHT ATRIUM  Index LA diam:        5.00 cm 2.33 cm/m  RA Area:     22.40 cm LA Vol (A2C):   48.2 ml 22.43 ml/m RA Volume:   63.50 ml  29.54 ml/m LA Vol (A4C):   44.5 ml 20.70 ml/m LA Biplane Vol: 46.6 ml 21.68 ml/m   AORTA Ao Root diam: 2.90 cm MITRAL VALVE                TRICUSPID VALVE MV Area (PHT): 3.58 cm     TR Peak grad:   35.0 mmHg MV Decel Time: 212 msec     TR Vmax:        296.00 cm/s MR Peak grad: 46.2 mmHg MR Mean grad: 25.0 mmHg     SHUNTS MR Vmax:      340.00 cm/s   Systemic Diam: 2.00 cm MR Vmean:     236.0 cm/s MV E velocity: 108.67 cm/s Prentice Docker MD Electronically signed by Prentice Docker MD Signature Date/Time: 03/09/2020/12:59:08 PM    Final     Micro Results   Recent Results (from the past 240 hour(s))  SARS CORONAVIRUS 2 (TAT 6-24 HRS) Nasopharyngeal Nasopharyngeal Swab     Status: None   Collection Time: 03/08/20 11:30 PM   Specimen: Nasopharyngeal Swab  Result Value Ref Range Status   SARS Coronavirus 2 NEGATIVE NEGATIVE Final    Comment: (NOTE) SARS-CoV-2 target nucleic acids are NOT DETECTED. The SARS-CoV-2 RNA is generally detectable in upper and lower respiratory specimens during the acute phase of infection. Negative results do not preclude SARS-CoV-2 infection, do not rule out co-infections with other pathogens, and should not be used as the sole basis for treatment or other patient management decisions. Negative results must be combined with clinical observations, patient history, and epidemiological information. The expected result is Negative. Fact Sheet for Patients: HairSlick.no Fact Sheet for Healthcare Providers: quierodirigir.com This test is not yet approved or cleared by the [place] FDA and  has been  authorized for detection and/or diagnosis of SARS-CoV-2 by FDA under an Emergency Use Authorization (EUA). This EUA will remain  in effect (meaning this test can be used) for the duration of the COVID-19 declaration under Section 56 4(b)(1) of the Act, 21 U.S.C. section 360bbb-3(b)(1), unless the authorization is terminated or revoked sooner. Performed at [facility], [address], [place], [place] [address]   Culture, blood (Routine X 2) w Reflex to ID Panel     Status: None   Collection Time: 03/09/20  6:25 PM   Specimen: Right Antecubital; Blood  Result Value Ref Range Status   Specimen Description RIGHT ANTECUBITAL  Final   Special Requests   Final    BOTTLES DRAWN AEROBIC AND ANAEROBIC Blood Culture adequate volume   Culture   Final    NO GROWTH 5 DAYS Performed at [facility], [address] [place], [place] [address]    Report Status 03/14/2020 FINAL  Final  Culture, blood (Routine X 2) w Reflex to ID Panel     Status: None   Collection Time: 03/09/20  6:25 PM   Specimen: BLOOD RIGHT HAND  Result Value Ref Range Status   Specimen Description BLOOD RIGHT HAND  Final   Special Requests   Final    BOTTLES DRAWN AEROBIC ONLY Blood Culture adequate volume   Culture   Final    NO GROWTH 5 DAYS Performed at [facility], [address] [place], [place] [address]  Report Status 03/14/2020 FINAL  Final  SARS CORONAVIRUS 2 (TAT 6-24 HRS) Nasopharyngeal Nasopharyngeal Swab     Status: None   Collection Time: 03/11/20  5:57 PM   Specimen: Nasopharyngeal Swab  Result Value Ref Range Status   SARS Coronavirus 2 NEGATIVE NEGATIVE Final    Comment: (NOTE) SARS-CoV-2 target nucleic acids are NOT DETECTED. The SARS-CoV-2 RNA is generally detectable in upper and lower respiratory specimens during the acute phase of infection. Negative results do not preclude SARS-CoV-2 infection, do not rule out co-infections with other pathogens, and should not be used as the sole basis  for treatment or other patient management decisions. Negative results must be combined with clinical observations, patient history, and epidemiological information. The expected result is Negative. Fact Sheet for Patients: HairSlick.no Fact Sheet for Healthcare Providers: quierodirigir.com This test is not yet approved or cleared by the [place] FDA and  has been authorized for detection and/or diagnosis of SARS-CoV-2 by FDA under an Emergency Use Authorization (EUA). This EUA will remain  in effect (meaning this test can be used) for the duration of the COVID-19 declaration under Section 56 4(b)(1) of the Act, 21 U.S.C. section 360bbb-3(b)(1), unless the authorization is terminated or revoked sooner. Performed at [facility], [address] [place], [place] [address]   SARS CORONAVIRUS 2 (TAT 6-24 HRS) Nasopharyngeal Nasopharyngeal Swab     Status: None   Collection Time: 03/14/20  5:00 AM   Specimen: Nasopharyngeal Swab  Result Value Ref Range Status   SARS Coronavirus 2 NEGATIVE NEGATIVE Final    Comment: (NOTE) SARS-CoV-2 target nucleic acids are NOT DETECTED. The SARS-CoV-2 RNA is generally detectable in upper and lower respiratory specimens during the acute phase of infection. Negative results do not preclude SARS-CoV-2 infection, do not rule out co-infections with other pathogens, and should not be used as the sole basis for treatment or other patient management decisions. Negative results must be combined with clinical observations, patient history, and epidemiological information. The expected result is Negative. Fact Sheet for Patients: HairSlick.no Fact Sheet for Healthcare Providers: quierodirigir.com This test is not yet approved or cleared by the [place] FDA and  has been authorized for detection and/or diagnosis of SARS-CoV-2 by FDA under  an Emergency Use Authorization (EUA). This EUA will remain  in effect (meaning this test can be used) for the duration of the COVID-19 declaration under Section 56 4(b)(1) of the Act, 21 U.S.C. section 360bbb-3(b)(1), unless the authorization is terminated or revoked sooner. Performed at [facility], [address] [place], [place] [address]   Respiratory Panel by RT PCR (Flu A&B, Covid) - Nasopharyngeal Swab     Status: None   Collection Time: 03/14/20 11:30 AM   Specimen: Nasopharyngeal Swab  Result Value Ref Range Status   SARS Coronavirus 2 by RT PCR NEGATIVE NEGATIVE Final    Comment: (NOTE) SARS-CoV-2 target nucleic acids are NOT DETECTED. The SARS-CoV-2 RNA is generally detectable in upper respiratoy specimens during the acute phase of infection. The lowest concentration of SARS-CoV-2 viral copies this assay can detect is 131 copies/mL. A negative result does not preclude SARS-Cov-2 infection and should not be used as the sole basis for treatment or other patient management decisions. A negative result may occur with  improper specimen collection/handling, submission of specimen other than nasopharyngeal swab, presence of viral mutation(s) within the areas targeted by this assay, and inadequate number of viral copies (<131 copies/mL). A negative result must be combined with  clinical observations, patient history, and epidemiological information. The expected result is Negative. Fact Sheet for Patients:  https://www.moore.com/ Fact Sheet for Healthcare Providers:  https://www.young.biz/ This test is not yet ap proved or cleared by the [place] FDA and  has been authorized for detection and/or diagnosis of SARS-CoV-2 by FDA under an Emergency Use Authorization (EUA). This EUA will remain  in effect (meaning this test can be used) for the duration of the COVID-19 declaration under Section 564(b)(1) of the Act, 21  U.S.C. section 360bbb-3(b)(1), unless the authorization is terminated or revoked sooner.    Influenza A by PCR NEGATIVE NEGATIVE Final   Influenza B by PCR NEGATIVE NEGATIVE Final    Comment: (NOTE) The Xpert Xpress SARS-CoV-2/FLU/RSV assay is intended as an aid in  the diagnosis of influenza from Nasopharyngeal swab specimens and  should not be used as a sole basis for treatment. Nasal washings and  aspirates are unacceptable for Xpert Xpress SARS-CoV-2/FLU/RSV  testing. Fact Sheet for Patients: https://www.moore.com/ Fact Sheet for Healthcare Providers: https://www.young.biz/ This test is not yet approved or cleared by the [place] FDA and  has been authorized for detection and/or diagnosis of SARS-CoV-2 by  FDA under an Emergency Use Authorization (EUA). This EUA will remain  in effect (meaning this test can be used) for the duration of the  Covid-19 declaration under Section 564(b)(1) of the Act, 21  U.S.C. section 360bbb-3(b)(1), unless the authorization is  terminated or revoked. Performed at [facility], [address], [place], [place] [address]   MRSA PCR Screening     Status: None   Collection Time: 03/16/20 12:30 PM   Specimen: Nasopharyngeal  Result Value Ref Range Status   MRSA by PCR NEGATIVE NEGATIVE Final    Comment:        The GeneXpert MRSA Assay (FDA approved for NASAL specimens only), is one component of a comprehensive MRSA colonization surveillance program. It is not intended to diagnose MRSA infection nor to guide or monitor treatment for MRSA infections. Performed at [facility], [address] [place], [place] [address]        Today   Subjective    Jesus Fowler today has no new complaints No fever  Or chills   No Nausea, Vomiting or Diarrhea   Patient has been seen and examined prior to discharge   Objective   Blood pressure (!) 106/55, pulse 68, temperature (!) 97.5 F (36.4 C),  temperature source Axillary, resp. rate 19, height 5\' 6"  (1.676 m), weight 99.1 kg, SpO2 99 %.   Intake/Output Summary (Last 24 hours) at 03/18/2020 1424 Last data filed at 03/18/2020 0900 Gross per 24 hour  Intake 480 ml  Output 1000 ml  Net -520 ml    Exam Gen:- Awake Alert, In no apparent distress  HEENT:- [place].AT, No sclera icterus Eye--much improved conjunctivae findings  Nose -2L/min Neck-Supple Neck,No JVD,.  Lungs-improved air movement, no wheezing  CV- S1, S2 normal, irregularly irregular  abd- +ve B.Sounds, Abd Soft, No tenderness,  Extremity/Skin:- pedal pulses present, left upper extremity cellulitis-resolved,  chronic lower extremity edema, chronic severe venous stasis dermatitis type changes of both lower extremities Psych-affect is appropriate, oriented x3 Neuro-generalized weakness and deconditioning, no new focal deficits, no tremors   Data Review   CBC w Diff:  Lab Results  Component Value Date   WBC 10.4 03/18/2020   HGB 15.9 03/18/2020   HCT 50.7 03/18/2020   PLT 84 (L) 03/18/2020   LYMPHOPCT 5 03/14/2020   MONOPCT  9 03/14/2020   EOSPCT 1 03/14/2020   BASOPCT 0 03/14/2020    CMP:  Lab Results  Component Value Date   NA 140 03/18/2020   K 4.3 03/18/2020   CL 98 03/18/2020   CO2 35 (H) 03/18/2020   BUN 23 03/18/2020   CREATININE 0.70 03/18/2020   PROT 4.9 (L) 03/17/2020   ALBUMIN 2.3 (L) 03/17/2020   BILITOT 3.8 (H) 03/17/2020   ALKPHOS 61 03/17/2020   AST 28 03/17/2020   ALT 23 03/17/2020  .   Total Discharge time is about 33 minutes  Shon Hale M.D on 03/18/2020 at 2:24 PM  Go to [email or web address] -  for contact info  [facility] - Office  [phone]

## 2020-03-18 NOTE — Care Management Important Message (Signed)
Important Message  Patient Details  Name: Jesus Fowler MRN: [id number] Date of Birth: 1933/08/04   Medicare Important Message Given:  Yes     Corey Harold 03/18/2020, 2:37 PM

## 2020-03-18 NOTE — Discharge Instructions (Signed)
1)Discharge to SNF rehab on 2 L of oxygen via nasal cannula continuously 2)Please use CPAP nightly 3) CBC and BMP test every Thursday for 2 weeks 4)Patient is on Eliquis/Apixban so Avoid ibuprofen/Advil/Aleve/Motrin/Goody Powders/Naproxen/BC powders/Meloxicam/Diclofenac/Indomethacin and other Nonsteroidal anti-inflammatory medications as these will make you more likely to bleed and can cause stomach ulcers, can also cause Kidney problems.  5)Dysphagia 2 (Fine chop);Thin liquid, Pt benefits from liquid wash during PO consumption--- 6)Avoid constipation 7)Please continue wound care/dressing changes to lower extremities once to twice daily

## 2020-03-18 NOTE — Plan of Care (Signed)
  Problem: Education: Goal: Knowledge of General Education information will improve Description: Including pain rating scale, medication(s)/side effects and non-pharmacologic comfort measures 03/18/2020 1551 by Sheela Stack, RN Outcome: Adequate for Discharge 03/18/2020 0856 by Sheela Stack, RN Outcome: Progressing   Problem: Health Behavior/Discharge Planning: Goal: Ability to manage health-related needs will improve 03/18/2020 1551 by Sheela Stack, RN Outcome: Adequate for Discharge 03/18/2020 0856 by Sheela Stack, RN Outcome: Progressing   Problem: Clinical Measurements: Goal: Ability to maintain clinical measurements within normal limits will improve 03/18/2020 1551 by Sheela Stack, RN Outcome: Adequate for Discharge 03/18/2020 0856 by Sheela Stack, RN Outcome: Progressing Goal: Will remain free from infection 03/18/2020 1551 by Sheela Stack, RN Outcome: Adequate for Discharge 03/18/2020 0856 by Sheela Stack, RN Outcome: Progressing Goal: Diagnostic test results will improve 03/18/2020 1551 by Sheela Stack, RN Outcome: Adequate for Discharge 03/18/2020 0856 by Sheela Stack, RN Outcome: Progressing Goal: Respiratory complications will improve 03/18/2020 1551 by Sheela Stack, RN Outcome: Adequate for Discharge 03/18/2020 0856 by Sheela Stack, RN Outcome: Progressing Goal: Cardiovascular complication will be avoided 03/18/2020 1551 by Sheela Stack, RN Outcome: Adequate for Discharge 03/18/2020 0856 by Sheela Stack, RN Outcome: Progressing   Problem: Activity: Goal: Risk for activity intolerance will decrease 03/18/2020 1551 by Sheela Stack, RN Outcome: Adequate for Discharge 03/18/2020 0856 by Sheela Stack, RN Outcome: Progressing   Problem: Nutrition: Goal: Adequate nutrition will be maintained 03/18/2020 1551 by Sheela Stack, RN Outcome: Adequate for Discharge 03/18/2020 0856 by Sheela Stack, RN Outcome: Progressing    Problem: Coping: Goal: Level of anxiety will decrease 03/18/2020 1551 by Sheela Stack, RN Outcome: Adequate for Discharge 03/18/2020 0856 by Sheela Stack, RN Outcome: Progressing   Problem: Coping: Goal: Level of anxiety will decrease 03/18/2020 1551 by Sheela Stack, RN Outcome: Adequate for Discharge 03/18/2020 0856 by Sheela Stack, RN Outcome: Progressing   Problem: Elimination: Goal: Will not experience complications related to bowel motility 03/18/2020 1551 by Sheela Stack, RN Outcome: Adequate for Discharge 03/18/2020 0856 by Sheela Stack, RN Outcome: Progressing Goal: Will not experience complications related to urinary retention 03/18/2020 1551 by Sheela Stack, RN Outcome: Adequate for Discharge 03/18/2020 0856 by Sheela Stack, RN Outcome: Progressing   Problem: Pain Managment: Goal: General experience of comfort will improve 03/18/2020 1551 by Sheela Stack, RN Outcome: Adequate for Discharge 03/18/2020 0856 by Sheela Stack, RN Outcome: Progressing   Problem: Safety: Goal: Ability to remain free from injury will improve 03/18/2020 1551 by Sheela Stack, RN Outcome: Adequate for Discharge 03/18/2020 0856 by Sheela Stack, RN Outcome: Progressing   Problem: Skin Integrity: Goal: Risk for impaired skin integrity will decrease 03/18/2020 1551 by Sheela Stack, RN Outcome: Adequate for Discharge 03/18/2020 0856 by Sheela Stack, RN Outcome: Progressing

## 2020-03-18 NOTE — Clinical Social Work Note (Addendum)
CSW called Kerri with [facility] to follow up with patient's insurance authorization to return to [facility]. Lynnea Ferrier reported the authorization is still pending and would notify CSW if the insurance authorization is approved today.  Addendum 2:11: Received call from Kerri at [facility] who notified CSW patient has insurance authorization and can return today once the discharge summary is received and patient has a new rapid COVID test completed.  2:22pm: Called AC/RN, Tim, to request a rapid COVID test for patient prior to returning to [facility]. AC reported he would put in the order.  3:56pm: Negative test for COVID results. RN notified report can be called. Left voicemail for Lynnea Ferrier regarding COVID test results and that RN had been notified to call report.  Memory Argue, LCSW Transitions of Care Clinical Social Worker [facility] Ph: [phone]

## 2020-03-19 DIAGNOSIS — L03114 Cellulitis of left upper limb: Secondary | ICD-10-CM | POA: Insufficient documentation

## 2020-03-19 DIAGNOSIS — N401 Enlarged prostate with lower urinary tract symptoms: Secondary | ICD-10-CM | POA: Insufficient documentation

## 2020-03-19 DIAGNOSIS — E785 Hyperlipidemia, unspecified: Secondary | ICD-10-CM | POA: Insufficient documentation

## 2020-03-19 DIAGNOSIS — R3915 Urgency of urination: Secondary | ICD-10-CM | POA: Insufficient documentation

## 2020-03-21 ENCOUNTER — Other Ambulatory Visit: Payer: Self-pay | Admitting: Adult Health

## 2020-03-21 ENCOUNTER — Non-Acute Institutional Stay (SKILLED_NURSING_FACILITY): Payer: Medicare Other | Admitting: Adult Health

## 2020-03-21 ENCOUNTER — Encounter: Payer: Self-pay | Admitting: Adult Health

## 2020-03-21 DIAGNOSIS — Z9989 Dependence on other enabling machines and devices: Secondary | ICD-10-CM

## 2020-03-21 DIAGNOSIS — K5909 Other constipation: Secondary | ICD-10-CM

## 2020-03-21 DIAGNOSIS — E785 Hyperlipidemia, unspecified: Secondary | ICD-10-CM

## 2020-03-21 DIAGNOSIS — N401 Enlarged prostate with lower urinary tract symptoms: Secondary | ICD-10-CM

## 2020-03-21 DIAGNOSIS — G4733 Obstructive sleep apnea (adult) (pediatric): Secondary | ICD-10-CM

## 2020-03-21 DIAGNOSIS — I482 Chronic atrial fibrillation, unspecified: Secondary | ICD-10-CM

## 2020-03-21 DIAGNOSIS — J9601 Acute respiratory failure with hypoxia: Secondary | ICD-10-CM

## 2020-03-21 DIAGNOSIS — I5033 Acute on chronic diastolic (congestive) heart failure: Secondary | ICD-10-CM | POA: Diagnosis not present

## 2020-03-21 DIAGNOSIS — K219 Gastro-esophageal reflux disease without esophagitis: Secondary | ICD-10-CM

## 2020-03-21 DIAGNOSIS — K6289 Other specified diseases of anus and rectum: Secondary | ICD-10-CM | POA: Diagnosis not present

## 2020-03-21 DIAGNOSIS — R3915 Urgency of urination: Secondary | ICD-10-CM

## 2020-03-21 DIAGNOSIS — E43 Unspecified severe protein-calorie malnutrition: Secondary | ICD-10-CM | POA: Insufficient documentation

## 2020-03-21 NOTE — Progress Notes (Signed)
Location:    [facility] Room Number: 160/P Place of Service:  SNF (31)   CODE STATUS: Full Code  No Known Allergies  Chief Complaint  Patient presents with  . Hospitalization Follow-up    Hospitalization Follow Up    HPI:  Jesus Fowler is a 86 year old man who has been hospitalized from 03-14-20 through 03-18-20. Jesus Fowler was taken to the ED for rectal bleeding. Jesus Fowler was found to have significant stool in colon and proctitis. Jesus Fowler is here for short term rehab with his goal to return back home. Jesus Fowler does complain of weakness. Jesus Fowler denies any changes in appetite; no complaints of pain. Jesus Fowler will continue to be followed for his chronic illnesses including: afib; respiratory failure; constipation.   Past Medical History:  Diagnosis Date  . A-fib (HCC)   . Cellulitis   . Hemorrhoid   . Hypertension   . Prostate disorder     Past Surgical History:  Procedure Laterality Date  . BIOPSY  03/17/2020   Procedure: BIOPSY;  Surgeon: Corbin Ade, MD;  Location: [facility];  Service: Endoscopy;;  gastric rectal  . ESOPHAGEAL DILATION N/A 03/17/2020   Procedure: ESOPHAGEAL DILATION;  Surgeon: Corbin Ade, MD;  Location: [facility];  Service: Endoscopy;  Laterality: N/A;  . ESOPHAGOGASTRODUODENOSCOPY (EGD) WITH PROPOFOL N/A 03/17/2020   Procedure: ESOPHAGOGASTRODUODENOSCOPY (EGD) WITH PROPOFOL;  Surgeon: Corbin Ade, MD;  Location: [facility];  Service: Endoscopy;  Laterality: N/A;  . FLEXIBLE SIGMOIDOSCOPY N/A 03/17/2020   Procedure: FLEXIBLE SIGMOIDOSCOPY WITH PROPOFOL;  Surgeon: Corbin Ade, MD;  Location: [facility];  Service: Endoscopy;  Laterality: N/A;  . HEMORRHOID SURGERY     patient reports several hemorrhoid surgeries in the past    Social History   Socioeconomic History  . Marital status: Married    Spouse name: Not on file  . Number of children: Not on file  . Years of education: Not on file  . Highest education level: Not on file  Occupational  History  . Not on file  Tobacco Use  . Smoking status: Former Games developer  . Smokeless tobacco: Never Used  Substance and Sexual Activity  . Alcohol use: Yes    Comment: "very little"  . Drug use: Never  . Sexual activity: Not on file  Other Topics Concern  . Not on file  Social History Narrative  . Not on file   Social Determinants of Health   Financial Resource Strain:   . Difficulty of Paying Living Expenses:   Food Insecurity:   . Worried About Programme researcher, broadcasting/film/video in the Last Year:   . Barista in the Last Year:   Transportation Needs:   . Freight forwarder (Medical):   Marland Kitchen Lack of Transportation (Non-Medical):   Physical Activity:   . Days of Exercise per Week:   . Minutes of Exercise per Session:   Stress:   . Feeling of Stress :   Social Connections:   . Frequency of Communication with Friends and Family:   . Frequency of Social Gatherings with Friends and Family:   . Attends Religious Services:   . Active Member of Clubs or Organizations:   . Attends Banker Meetings:   Marland Kitchen Marital Status:   Intimate Partner Violence:   . Fear of Current or Ex-Partner:   . Emotionally Abused:   Marland Kitchen Physically Abused:   . Sexually Abused:    Family History  Problem  Relation Age of Onset  . Colon cancer Neg Hx       VITAL SIGNS BP 128/72   Pulse 66   Temp 98.7 F (37.1 C) (Oral)   Resp 16   Ht 5\' 6"  (1.676 m)   Wt 211 lb 8 oz (95.9 kg)   SpO2 95%   BMI 34.14 kg/m   Outpatient Encounter Medications as of 03/21/2020  Medication Sig  . acetaminophen (TYLENOL) 325 MG tablet Take 2 tablets (650 mg total) by mouth every 6 (six) hours as needed for mild pain, fever or headache (or Fever >/= 101).  Marland Kitchen albuterol (VENTOLIN HFA) 108 (90 Base) MCG/ACT inhaler Inhale 2 puffs into the lungs every 6 (six) hours as needed for wheezing or shortness of breath.  Marland Kitchen ascorbic acid (VITAMIN C) 500 MG tablet Take 1,000 mg by mouth daily.  . Cholecalciferol (VITAMIN D3) 125  MCG (5000 UT) CAPS Take 1 capsule (5,000 Units total) by mouth daily.  Marland Kitchen ELIQUIS 5 MG TABS tablet Take 2.5 mg by mouth 2 (two) times daily.   . feeding supplement, ENSURE ENLIVE, (ENSURE ENLIVE) LIQD Take 237 mLs by mouth 2 (two) times daily between meals.  Marland Kitchen HYDROcodone-acetaminophen (NORCO/VICODIN) 5-325 MG tablet Take 1 tablet by mouth every 6 (six) hours as needed for moderate pain.  . hydrOXYzine (ATARAX/VISTARIL) 25 MG tablet Take 1 tablet (25 mg total) by mouth 3 (three) times daily as needed for anxiety.  Marland Kitchen linaclotide (LINZESS) 145 MCG CAPS capsule Take 1 capsule (145 mcg total) by mouth daily before breakfast.  . metoprolol succinate (TOPROL-XL) 25 MG 24 hr tablet Take 0.5 tablets (12.5 mg total) by mouth daily.  . NON FORMULARY May use CPAP from home with home settings. At Bedtime  . NON FORMULARY Dysphagia 2 thin liquids  . Omega-3 Fatty Acids (FISH OIL BURP-LESS PO) Take 2 capsules by mouth daily.  . OXYGEN Inhale 2 L into the lungs continuous.  . pantoprazole (PROTONIX) 40 MG tablet Take 1 tablet (40 mg total) by mouth daily.  . polyethylene glycol (MIRALAX / GLYCOLAX) 17 g packet Take 17 g by mouth 2 (two) times daily.  . potassium chloride (KLOR-CON) 10 MEQ tablet Take 10 mEq by mouth daily.  . Probiotic Product (RISA-BID PROBIOTIC PO) Take 1 capsule by mouth daily.  . rosuvastatin (CRESTOR) 10 MG tablet Take 10 mg by mouth in the morning.   . senna-docusate (SENOKOT-S) 8.6-50 MG tablet Take 2 tablets by mouth at bedtime.  . sodium chloride (OCEAN) 0.65 % SOLN nasal spray Place 2 sprays into both nostrils 3 (three) times daily.  . tamsulosin (FLOMAX) 0.4 MG CAPS capsule Take 1 capsule (0.4 mg total) by mouth daily after supper.  . torsemide (DEMADEX) 20 MG tablet Take 1 tablet (20 mg total) by mouth daily.  . Vitamin E 400 units TABS Take 2 tablets by mouth daily.   . [DISCONTINUED] erythromycin ophthalmic ointment Place into both eyes 3 (three) times daily for 3 days.  .  [DISCONTINUED] hydrocortisone cream 1 % Apply 1 application topically 3 (three) times daily as needed for itching (minor skin irritation).   No facility-administered encounter medications on file as of 03/21/2020.     SIGNIFICANT DIAGNOSTIC EXAMS  PREVIOUS   03-08-20: chest x-ray: Stable significant elevation of the right hemidiaphragm. No acute finding.  03-08-20: ct angio of chest:  No evidence of pulmonary emboli. Small right pleural effusion with right basilar atelectasis. 5 mm nodule in the left upper lobe increased in size from  16 years ago at which time it measured 1-2 mm. No follow-up needed if patient is low-risk. Non-contrast chest CT can be considered in 12 months if patient is high-risk.  03-09-20: 2-d echo:  Left ventricular ejection fraction, by estimation, is 60 to 65%. The  left ventricle has normal function. The left ventricle has no regional  wall motion abnormalities. There is mild concentric left ventricular  hypertrophy. Left ventricular diastolic  parameters were normal. There is the interventricular septum is flattened  in systole, consistent with right ventricular pressure overload.   03-09-20: left upper extremity doppler: No evidence of DVT within the left upper extremity.   03-13-20: chest x-ray:  Low lung volumes. Stable elevation of the right hemidiaphragm. Hazy right basilar lung opacity, which could represent atelectasis, aspiration and/or pneumonia. New blunting of the right costophrenic angle, cannot exclude small right pleural effusion.  TODAY  03-14-20: ct of abdomen and pelvis:  An acute mild uncomplicated proctitis with small amount of free fluid in the presacral space and edema in the perirectal fat plane without perforation or abscess. Large stool burden throughout the colon. Sigmoid diverticulosis without acute diverticulitis or contrast extravasation within the colonic lumen. Apparent mild mucosal thickening of the gastric lumen and a left lower  abdominal quadrant small bowel loop, which could be secondary to incomplete distension or a mild gastroenteritis. Abnormal enhancement in the medial right hepatic dome and in the splenic parenchyma, possibly intrahepatic varices and splenic hemangioma, new or not apparent on the March 08, 2020 and April 18, 2004 CT chest studies. Mild cardiomegaly with four-vessel moderate to severe coronary calcifications, small bilateral pleural effusions and bibasilar compressive atelectasis. Extensive anasarca. Nonobstructing small left renal calculus. Aortic calcified atherosclerosis. Pulmonary artery hypertension.   03-17-20: EGD:- Normal esophagus. Dilated. -Gastric mucosal changes consistent with portal hypertensive gastropathy .  Gastric erosions. Status post gastric biopsy. - Normal duodenal bulb and second portion of the duodenum.    03-17-20: flexible sigmoidoscopy:  -Distal rectal ulceration with erosion involving the distal 6 to 7 cm of the rectum circumferentially -likely pressure induced phenomenon (constipation). Status post biopsy.   LABS REVIEWED PREVIOUS;   03-08-20: wbc 18.9 hgb 16.7; hct 51.1; mcv 98.1 plt 143; glucose 100; bun 29; creat 1.28; k= 3.7; na++ 140; ca 9.4; total bili 2.9; albumin 3.2; tsh 0.492; mag 2.5 03-09-20: blood cultures: no growth 03-10-20: wbc 17.0; hgb 16.4; hct 52.2; mcv 100.4 plt 108; glucose 112; bun 29; creat 1.25; k+ 3.7; an++ 140; ca 8.9  03-13-20: wbc 13.8; hgb 15.1; hct 49.4 mcv 103.1 plt 85; glucose 94; bun 21; creat 0.87; k+ 3.7; na++ 140; ca 8.1   TODAY  03-14-20: wbc 19.4; hgb 16.7; hct 52.7; mcv 101.3 plt 87; glucose 114; bun 20; creat 0.74; k+ 3.9; na++ 140; ca 8.3; liver normal albumin 2.7 03-17-20: glucose 82; bun 20; creat 0.67; k+ 3.8; na++ 138; ca 8.2 liver normal albumin 2.3 03-18-20: wbc 10.4; hgb 15.9; hct 50.7 mcv 101.4; plt 84; glucose 105; bun 23; creat 0.70; k+ 4.3; na++ 140; ca 9.1    Review of Systems  Constitutional: Positive for  malaise/fatigue.  Respiratory: Negative for cough and shortness of breath.   Cardiovascular: Negative for chest pain, palpitations and leg swelling.  Gastrointestinal: Negative for abdominal pain, constipation and heartburn.  Musculoskeletal: Negative for back pain, joint pain and myalgias.  Skin: Negative.   Neurological: Positive for weakness. Negative for dizziness.  Psychiatric/Behavioral: The patient is not nervous/anxious.     Physical Exam Constitutional:  General: Jesus Fowler is not in acute distress.    Appearance: Jesus Fowler is well-developed. Jesus Fowler is morbidly obese. Jesus Fowler is not diaphoretic.  Eyes:     Comments: Bilateral sclera red  Neck:     Thyroid: No thyromegaly.  Cardiovascular:     Rate and Rhythm: Normal rate. Rhythm irregular.     Pulses: Normal pulses.     Heart sounds: Normal heart sounds.  Pulmonary:     Effort: Pulmonary effort is normal. No respiratory distress.     Breath sounds: Normal breath sounds.     Comments: 02 dependent Uses CPAP  Abdominal:     General: Bowel sounds are normal. There is no distension.     Palpations: Abdomen is soft.     Tenderness: There is no abdominal tenderness.  Musculoskeletal:     Cervical back: Neck supple.     Right lower leg: Edema present.     Left lower leg: Edema present.     Comments: Anasarca present  Is less   is able to move all extremities    Lymphadenopathy:     Cervical: No cervical adenopathy.  Skin:    General: Skin is warm and dry.  Neurological:     Mental Status: Jesus Fowler is alert and oriented to person, place, and time.  Psychiatric:        Mood and Affect: Mood normal.      ASSESSMENT/ PLAN:  TODAY;   1. Proctitis/chronic constipation: is stable will continue linzess 145 mcg daily miralax 17 gm twice daily and senna s 2 tabs daily    2. Acute on chronic congestive heart failure with preserved ejection fraction/diastolic dysfunction: Is stable EF 60-65% (03-09-20) will continue demadex 20 mg daily with k+ 10  meq daily   3. Chronic atrial fibrillation: is stable will continue toprol xl 12.5 mg daily for rate control and eliquis 2.5 mg twice daily   4. OSA with CPAP: is stable uses CPAP nightly   5. Acute respiratory failure with hypoxia: is stable is 02 dependent will continue albuterl 2 puffs every 6 hours as needed   6. Dyslipidemia: is stable will continue crestor 10 mg daily and fish oil 2 gm daily   7. Benign prostatic hypertrophy with urinary urgency: is stable will continue flomax 0.4 mg daily   8. Thrombocytopenia: is stable plt 91  9. GERD without esophagitis: is stable will continue protonix 40 mg daily   10. Protein calorie malnutrition; severe: is without change albumin 2.3 will continue ensure twice daily and will begin prostat 30 cc twice daily   Will stop vicodin  Will get cbc; bmp weekly for 2 weeks.      MD is aware of resident's narcotic use and is in agreement with current plan of care. We will attempt to wean resident as appropriate.  Synthia Innocent NP [facility]  Contact [phone] Monday through Friday 8am- 5pm  After hours call [phone]

## 2020-03-23 ENCOUNTER — Encounter (INDEPENDENT_AMBULATORY_CARE_PROVIDER_SITE_OTHER): Payer: Medicare Other | Admitting: Ophthalmology

## 2020-03-23 ENCOUNTER — Non-Acute Institutional Stay (SKILLED_NURSING_FACILITY): Payer: Medicare Other | Admitting: Internal Medicine

## 2020-03-23 ENCOUNTER — Encounter: Payer: Self-pay | Admitting: Internal Medicine

## 2020-03-23 DIAGNOSIS — K6289 Other specified diseases of anus and rectum: Secondary | ICD-10-CM

## 2020-03-23 DIAGNOSIS — R1310 Dysphagia, unspecified: Secondary | ICD-10-CM

## 2020-03-23 DIAGNOSIS — K625 Hemorrhage of anus and rectum: Secondary | ICD-10-CM

## 2020-03-23 DIAGNOSIS — R9431 Abnormal electrocardiogram [ECG] [EKG]: Secondary | ICD-10-CM

## 2020-03-23 DIAGNOSIS — J9601 Acute respiratory failure with hypoxia: Secondary | ICD-10-CM

## 2020-03-23 DIAGNOSIS — R17 Unspecified jaundice: Secondary | ICD-10-CM

## 2020-03-23 DIAGNOSIS — I5033 Acute on chronic diastolic (congestive) heart failure: Secondary | ICD-10-CM | POA: Diagnosis not present

## 2020-03-23 DIAGNOSIS — Z9989 Dependence on other enabling machines and devices: Secondary | ICD-10-CM

## 2020-03-23 DIAGNOSIS — I482 Chronic atrial fibrillation, unspecified: Secondary | ICD-10-CM

## 2020-03-23 DIAGNOSIS — G4733 Obstructive sleep apnea (adult) (pediatric): Secondary | ICD-10-CM

## 2020-03-23 DIAGNOSIS — E785 Hyperlipidemia, unspecified: Secondary | ICD-10-CM

## 2020-03-23 NOTE — Progress Notes (Signed)
: Provider:  Margit HanksAlexander, Dontrey Snellgrove D., MD Location:  [facility] Room Number: 160-P Place of Service:  SNF (31) You   PCP: [name], Richard, MD Patient Care Team: [name], Richard, MD as PCP - General (Internal Medicine)  Extended Emergency Contact Information Primary Emergency Contact: Gikas,Rose Address: [address]          [place] [id number] [place] Home Phone: [phone] Mobile Phone: [phone] Relation: None Secondary Emergency Contact: Marita SnellenScruggs, Marty Home Phone: [phone] Mobile Phone: [phone] Relation: Son     Allergies: Patient has no known allergies.  Chief Complaint  Patient presents with  . New Admit To SNF    New admission to [facility]    HPI: Patient is an 86 y.o. male with history of recent hospitalization for anasarca and cellulitis of upper extremity that responded to diuretics and antibiotic therapy, with chronic diastolic heart failure with preserved ejection fraction who was sent to [facility] emergency department on the day of admission for rectal bleeding.  On exam patient was found to have frank blood in the rectal vault.  Hemoglobin was stable.  CT revealed proctitis and sigmoid diverticulosis without evidence of inflammation patient was admitted to [facility] from 3/15-19 for work-up of rectal bleeding..  Sigmoidoscopy showed distal rectal laceration, presumably secondary to pressure from constipation.  Hospital course was complicated by acute hypoxic respiratory failure secondary to acute on chronic congestive heart failure which improved with IV Lasix.  Was also discovered that patient had dysphagia.  Patient underwent EGD with dilatation on 3/18 with evidence of portal hypertension gastropathy and antral erosions treated with PPI.  Patient is admitted to skilled nursing facility for OT/PT possibly residential care.  While at skilled nursing facility patient will be followed for  hypertension to metoprolol, chronic atrial fibrillation treated with Toprol and Eliquis and hyperlipidemia treated with Crestor and fish oil.     Past Medical History:  Diagnosis Date  . A-fib (HCC)   . Cellulitis   . Hemorrhoid   . Hypertension   . Prostate disorder     Past Surgical History:  Procedure Laterality Date  . BIOPSY  03/17/2020   Procedure: BIOPSY;  Surgeon: Corbin Adeourk, Robert M, MD;  Location: [facility];  Service: Endoscopy;;  gastric rectal  . ESOPHAGEAL DILATION N/A 03/17/2020   Procedure: ESOPHAGEAL DILATION;  Surgeon: Corbin Adeourk, Robert M, MD;  Location: [facility];  Service: Endoscopy;  Laterality: N/A;  . ESOPHAGOGASTRODUODENOSCOPY (EGD) WITH PROPOFOL N/A 03/17/2020   Procedure: ESOPHAGOGASTRODUODENOSCOPY (EGD) WITH PROPOFOL;  Surgeon: Corbin Adeourk, Robert M, MD;  Location: [facility];  Service: Endoscopy;  Laterality: N/A;  . FLEXIBLE SIGMOIDOSCOPY N/A 03/17/2020   Procedure: FLEXIBLE SIGMOIDOSCOPY WITH PROPOFOL;  Surgeon: Corbin Adeourk, Robert M, MD;  Location: [facility];  Service: Endoscopy;  Laterality: N/A;  . HEMORRHOID SURGERY     patient reports several hemorrhoid surgeries in the past    Allergies as of 03/23/2020   No Known Allergies     Medication List    Notice   This visit is during an admission. Changes to the med list made in this visit will be reflected in the After Visit Summary of the admission.    Current Outpatient Medications on File Prior to Visit  Medication Sig Dispense Refill  . acetaminophen (TYLENOL) 325 MG tablet Take 2 tablets (650 mg total) by mouth every 6 (six) hours as needed for mild pain, fever or headache (or Fever >/= 101). 12 tablet 2  .  albuterol (VENTOLIN HFA) 108 (90 Base) MCG/ACT inhaler Inhale 2 puffs into the lungs every 6 (six) hours as needed for wheezing or shortness of breath.    . Amino Acids-Protein Hydrolys (FEEDING SUPPLEMENT, PRO-STAT SUGAR FREE 64,) LIQD Take 30 mLs by mouth in the morning and at bedtime.    Marland Kitchen  ascorbic acid (VITAMIN C) 500 MG tablet Take 1,000 mg by mouth daily.    . Cholecalciferol (VITAMIN D3) 125 MCG (5000 UT) CAPS Take 1 capsule (5,000 Units total) by mouth daily. 30 capsule 1  . ELIQUIS 5 MG TABS tablet Take 2.5 mg by mouth 2 (two) times daily.     . feeding supplement, ENSURE ENLIVE, (ENSURE ENLIVE) LIQD Take 237 mLs by mouth 2 (two) times daily between meals. 23700 mL 1  . hydrOXYzine (ATARAX/VISTARIL) 25 MG tablet Take 1 tablet (25 mg total) by mouth 3 (three) times daily as needed for anxiety. 30 tablet 0  . linaclotide (LINZESS) 145 MCG CAPS capsule Take 1 capsule (145 mcg total) by mouth daily before breakfast. 30 capsule 6  . metoprolol succinate (TOPROL-XL) 25 MG 24 hr tablet Take 0.5 tablets (12.5 mg total) by mouth daily. 15 tablet 2  . NON FORMULARY May use CPAP from home with home settings. At Bedtime    . NON FORMULARY Dysphagia 2 thin liquids    . Omega-3 Fatty Acids (FISH OIL BURP-LESS PO) Take 2 capsules by mouth daily.    . OXYGEN Inhale 2 L into the lungs continuous.    . pantoprazole (PROTONIX) 40 MG tablet Take 1 tablet (40 mg total) by mouth daily. 30 tablet 2  . polyethylene glycol (MIRALAX / GLYCOLAX) 17 g packet Take 17 g by mouth 2 (two) times daily. 60 each 3  . potassium chloride (KLOR-CON) 10 MEQ tablet Take 10 mEq by mouth daily.    . Probiotic Product (RISA-BID PROBIOTIC PO) Take 1 capsule by mouth daily.    . rosuvastatin (CRESTOR) 10 MG tablet Take 10 mg by mouth in the morning.     . senna-docusate (SENOKOT-S) 8.6-50 MG tablet Take 2 tablets by mouth at bedtime. 60 tablet 1  . sodium chloride (OCEAN) 0.65 % SOLN nasal spray Place 2 sprays into both nostrils 3 (three) times daily. 30 mL 0  . tamsulosin (FLOMAX) 0.4 MG CAPS capsule Take 1 capsule (0.4 mg total) by mouth daily after supper. 30 capsule 1  . torsemide (DEMADEX) 20 MG tablet Take 1 tablet (20 mg total) by mouth daily. 30 tablet 1  . Vitamin E 400 units TABS Take 2 tablets by mouth  daily.      No current facility-administered medications on file prior to visit.     No orders of the defined types were placed in this encounter.    There is no immunization history on file for this patient.  Social History   Tobacco Use  . Smoking status: Former Research scientist (life sciences)  . Smokeless tobacco: Never Used  Substance Use Topics  . Alcohol use: Yes    Comment: "very little"    Family history is   Family History  Problem Relation Age of Onset  . Colon cancer Neg Hx   COPD in both mom and dad    Review of Systems   GENERAL:  no fevers, fatigue, appetite changes SKIN: No itching, or rash EYES: No eye pain, redness, discharge EARS: No earache, tinnitus, change in hearing NOSE: No congestion, drainage or bleeding  MOUTH/THROAT: No mouth or tooth pain, No sore throat  RESPIRATORY: No cough, wheezing, SOB CARDIAC: No chest pain, palpitations, lower extremity edema  GI: No abdominal pain, No N/V/D or constipation, No heartburn or reflux  GU: No dysuria, frequency or urgency, or incontinence  MUSCULOSKELETAL: No unrelieved bone/joint pain NEUROLOGIC: No headache, dizziness or focal weakness PSYCHIATRIC: No c/o anxiety or sadness   Vitals:   03/23/20 1045  BP: 116/71  Pulse: 85  Resp: 18  Temp: 99 F (37.2 C)  SpO2: 95%    SpO2 Readings from Last 1 Encounters:  03/23/20 95%   Body mass index is 34.14 kg/m.     Physical Exam  GENERAL APPEARANCE: Alert, conversant,  No acute distress.  SKIN: Chronic skin changes bilateral lower extremity HEAD: Normocephalic, atraumatic  EYES: Conjunctiva/lids clear. Pupils round, reactive. EOMs intact.  EARS: External exam WNL, canals clear. Hearing grossly normal.  NOSE: No deformity or discharge.  MOUTH/THROAT: Lips w/o lesions  RESPIRATORY: Breathing is even, unlabored. Lung sounds are clear   CARDIOVASCULAR: Heart irregular no murmurs, rubs or gallops.  1+ peripheral edema.   GASTROINTESTINAL: Abdomen is soft,  non-tender, not distended w/ normal bowel sounds. GENITOURINARY: Bladder non tender, not distended  MUSCULOSKELETAL: No abnormal joints or musculature NEUROLOGIC:  Cranial nerves 2-12 grossly intact. Moves all extremities  PSYCHIATRIC: Mood and affect appropriate to situation, no behavioral issues  Patient Active Problem List   Diagnosis Date Noted  . Protein-calorie malnutrition, severe (HCC) 03/21/2020  . GERD without esophagitis 03/21/2020  . Cellulitis of left upper extremity 03/19/2020  . Dyslipidemia 03/19/2020  . Benign prostatic hyperplasia (BPH) with urinary urgency 03/19/2020  . Proctitis   . Rectal bleeding   . Dysphagia   . Early satiety   . Acute respiratory failure with hypoxia (HCC) 03/14/2020  . OSA on CPAP 03/14/2020  . Obesity (BMI 30-39.9) 03/14/2020  . Cellulitis 03/14/2020  . Venous stasis dermatitis of both lower extremities 03/14/2020  . Chronic constipation 03/14/2020  . Anticoagulated for Afib 03/14/2020  . Acute on chronic heart failure with preserved ejection fraction (HFpEF) /Diastolic Dysfunction 03/14/2020  . Hematochezia 03/14/2020  . Swelling of lower extremity 03/08/2020  . Atrial fibrillation, chronic (HCC) 03/08/2020  . Swelling of both lower extremities 03/08/2020      Labs reviewed: Basic Metabolic Panel:    Component Value Date/Time   NA 140 03/18/2020 0646   K 4.3 03/18/2020 0646   CL 98 03/18/2020 0646   CO2 35 (H) 03/18/2020 0646   GLUCOSE 105 (H) 03/18/2020 0646   BUN 23 03/18/2020 0646   CREATININE 0.70 03/18/2020 0646   CALCIUM 9.1 03/18/2020 0646   PROT 4.9 (L) 03/17/2020 0555   ALBUMIN 2.3 (L) 03/17/2020 0555   AST 28 03/17/2020 0555   ALT 23 03/17/2020 0555   ALKPHOS 61 03/17/2020 0555   BILITOT 3.8 (H) 03/17/2020 0555   GFRNONAA >60 03/18/2020 0646   GFRAA >60 03/18/2020 0646    Recent Labs    03/08/20 1758 03/09/20 0554 03/11/20 0427 03/13/20 0627 03/14/20 1539 03/17/20 0555 03/18/20 0646  NA  --    < >  142   < > 140 138 140  K  --    < > 3.6   < > 3.9 3.8 4.3  CL  --    < > 96*   < > 94* 98 98  CO2  --    < > 36*   < > 39* 34* 35*  GLUCOSE  --    < > 86   < > 114*  82 105*  BUN  --    < > 26*   < > 20 20 23   CREATININE  --    < > 1.14   < > 0.74 0.67 0.70  CALCIUM  --    < > 8.5*   < > 8.3* 8.2* 9.1  MG 2.5*  --  2.3  --   --  1.9  --    < > = values in this interval not displayed.   Liver Function Tests: Recent Labs    03/09/20 0554 03/14/20 1539 03/17/20 0555  AST 30 23 28   ALT 27 21 23   ALKPHOS 72 74 61  BILITOT 2.2* 2.8* 3.8*  PROT 5.9* 5.7* 4.9*  ALBUMIN 2.8* 2.7* 2.3*   No results for input(s): LIPASE, AMYLASE in the last 8760 hours. No results for input(s): AMMONIA in the last 8760 hours. CBC: Recent Labs    03/08/20 1535 03/09/20 0554 03/14/20 1539 03/14/20 1539 03/15/20 0553 03/16/20 0607 03/18/20 0646  WBC 18.9*   < > 19.4*   < > 14.9* 11.8* 10.4  NEUTROABS 16.7*  --  16.4*  --   --   --   --   HGB 16.7   < > 16.7   < > 16.2 15.3 15.9  HCT 51.1   < > 52.7*   < > 51.3 49.5 50.7  MCV 98.1   < > 101.3*   < > 101.8* 102.7* 101.4*  PLT 143*   < > 87*   < > 77* 83* 84*   < > = values in this interval not displayed.   Lipid No results for input(s): CHOL, HDL, LDLCALC, TRIG in the last 8760 hours.  Cardiac Enzymes: No results for input(s): CKTOTAL, CKMB, CKMBINDEX, TROPONINI in the last 8760 hours. BNP: Recent Labs    03/08/20 1535  BNP 280.0*   No results found for: MICROALBUR No results found for: HGBA1C Lab Results  Component Value Date   TSH 0.492 03/08/2020   No results found for: VITAMINB12 No results found for: FOLATE No results found for: IRON, TIBC, FERRITIN  Imaging and Procedures obtained prior to SNF admission: No results found.   Not all labs, radiology exams or other studies done during hospitalization come through on my EPIC note; however they are reviewed by me.    Assessment and Plan  Rectal  bleeding/hematochezia-hemoglobin stable at 15.9; CT abdomen pelvis suggest diverticulosis of the sigmoid colon, flex sig showed distal rectal laceration presumably secondary to pressure injury from constipation; treat with MiraLAX SNF-[facility] for OT/PT/ST continue MiraLAX 17 g twice daily for 1 month then back off to once daily and Linzess 145 mcg daily  Acute hypoxic respiratory failure/acute on chronic heart failure with preserved ejection fraction/OSA;-EF 60-65%; responded to aggressive diuresis with IV Lasix and her weight went down from 236 to 218 pounds SNF-continue Demadex 20 mg daily and CPAP with sleep  Chronic atrial fibrillation SNF-rate controlled with Toprol-XL 12.5 mg daily and Eliquis as prophylaxis  Prolonged QT-QT 457 SNF-follow-up BMP; keep potassium close to 4 mag above 2 avoid medications that prolong QT interval  Elevated bilirubin-to 2.9, LFTs within normal limits; asymptomatic SNF-follow-up CMP  Hypertension BP was soft so several blood pressure medicines were stopped SNF-continue Toprol-XL 12.5 mg daily  Dysphagia-EGD with dilatation on 3/18 with evidence of portal hypertension gastropathy and antral erosions Protonix 40 mg daily prescribed SNF-continue Protonix 40 mg daily with dysphagia 2 diet with thin liquids  Hyperlipidemia SNF-not stated as uncontrolled; continue  Crestor 10 mg daily and fish oil 2 g daily   Time spent greater than 45 minutes;> 50% of time with patient was spent reviewing records, labs, tests and studies, counseling and developing plan of care  Margit Hanks, MD

## 2020-03-24 ENCOUNTER — Encounter (HOSPITAL_COMMUNITY)
Admission: RE | Admit: 2020-03-24 | Discharge: 2020-03-24 | Disposition: A | Discharge status: Home / Self Care | Payer: Medicare Other | Source: Skilled Nursing Facility | Attending: Internal Medicine | Admitting: Internal Medicine

## 2020-03-24 DIAGNOSIS — K649 Unspecified hemorrhoids: Secondary | ICD-10-CM | POA: Insufficient documentation

## 2020-03-24 DIAGNOSIS — I872 Venous insufficiency (chronic) (peripheral): Secondary | ICD-10-CM | POA: Insufficient documentation

## 2020-03-24 DIAGNOSIS — J9601 Acute respiratory failure with hypoxia: Secondary | ICD-10-CM | POA: Insufficient documentation

## 2020-03-24 DIAGNOSIS — I5032 Chronic diastolic (congestive) heart failure: Secondary | ICD-10-CM | POA: Insufficient documentation

## 2020-03-24 DIAGNOSIS — K5900 Constipation, unspecified: Secondary | ICD-10-CM | POA: Insufficient documentation

## 2020-03-24 LAB — BASIC METABOLIC PANEL
Anion gap: 6 (ref 5–15)
BUN: 22 mg/dL (ref 8–23)
CO2: 37 mmol/L — ABNORMAL HIGH (ref 22–32)
Calcium: 8.5 mg/dL — ABNORMAL LOW (ref 8.9–10.3)
Chloride: 97 mmol/L — ABNORMAL LOW (ref 98–111)
Creatinine, Ser: 0.8 mg/dL (ref 0.61–1.24)
GFR calc Af Amer: >60 mL/min (ref >60)
GFR calc non Af Amer: >60 mL/min (ref >60)
Glucose, Bld: 99 mg/dL (ref 70–99)
Potassium: 3.7 mmol/L (ref 3.5–5.1)
Sodium: 140 mmol/L (ref 135–145)

## 2020-03-24 LAB — CBC
HCT: 43.6 % (ref 39.0–52.0)
Hemoglobin: 13.9 g/dL (ref 13.0–17.0)
MCH: 32.3 pg (ref 26.0–34.0)
MCHC: 31.9 g/dL (ref 30.0–36.0)
MCV: 101.4 fL — ABNORMAL HIGH (ref 80.0–100.0)
Platelets: 100 10*3/uL — ABNORMAL LOW (ref 150–400)
RBC: 4.3 MIL/uL (ref 4.22–5.81)
RDW: 19.1 % — ABNORMAL HIGH (ref 11.5–15.5)
WBC: 7.8 10*3/uL (ref 4.0–10.5)
nRBC: 0 % (ref 0.0–0.2)

## 2020-03-26 ENCOUNTER — Encounter: Payer: Self-pay | Admitting: Internal Medicine

## 2020-03-26 DIAGNOSIS — R9431 Abnormal electrocardiogram [ECG] [EKG]: Secondary | ICD-10-CM | POA: Insufficient documentation

## 2020-03-26 DIAGNOSIS — R17 Unspecified jaundice: Secondary | ICD-10-CM | POA: Insufficient documentation

## 2020-03-28 ENCOUNTER — Encounter: Payer: Self-pay | Admitting: Adult Health

## 2020-03-28 ENCOUNTER — Non-Acute Institutional Stay (SKILLED_NURSING_FACILITY): Payer: Medicare Other | Admitting: Adult Health

## 2020-03-28 DIAGNOSIS — I5033 Acute on chronic diastolic (congestive) heart failure: Secondary | ICD-10-CM

## 2020-03-28 DIAGNOSIS — I482 Chronic atrial fibrillation, unspecified: Secondary | ICD-10-CM | POA: Diagnosis not present

## 2020-03-28 DIAGNOSIS — G4733 Obstructive sleep apnea (adult) (pediatric): Secondary | ICD-10-CM | POA: Diagnosis not present

## 2020-03-28 DIAGNOSIS — Z9989 Dependence on other enabling machines and devices: Secondary | ICD-10-CM | POA: Diagnosis not present

## 2020-03-28 NOTE — Progress Notes (Signed)
Location:    [facility] Room Number: 160/P Place of Service:  SNF (31)   CODE STATUS: Full Code  No Known Allergies  Chief Complaint  Patient presents with  . Medical Management of Chronic Issues        Acute on chronic congestive heart failure is preserved ejection fraction/ diastolic dysfunction:  Chronic atrial fibrillation:   OSA with CPAP;   Weekly follow up for the first 30 days post hospitalization.      HPI:  He is a 86 year old short term rehab being seen for the management of his chronic illnesses: chf; osa; afib. He denies any chest pain; no shortness of breath no heart palpitations.   Past Medical History:  Diagnosis Date  . A-fib (HCC)   . Cellulitis   . Hemorrhoid   . Hypertension   . Prostate disorder     Past Surgical History:  Procedure Laterality Date  . BIOPSY  03/17/2020   Procedure: BIOPSY;  Surgeon: Corbin Ade, MD;  Location: [facility];  Service: Endoscopy;;  gastric rectal  . ESOPHAGEAL DILATION N/A 03/17/2020   Procedure: ESOPHAGEAL DILATION;  Surgeon: Corbin Ade, MD;  Location: [facility];  Service: Endoscopy;  Laterality: N/A;  . ESOPHAGOGASTRODUODENOSCOPY (EGD) WITH PROPOFOL N/A 03/17/2020   Procedure: ESOPHAGOGASTRODUODENOSCOPY (EGD) WITH PROPOFOL;  Surgeon: Corbin Ade, MD;  Location: [facility];  Service: Endoscopy;  Laterality: N/A;  . FLEXIBLE SIGMOIDOSCOPY N/A 03/17/2020   Procedure: FLEXIBLE SIGMOIDOSCOPY WITH PROPOFOL;  Surgeon: Corbin Ade, MD;  Location: [facility];  Service: Endoscopy;  Laterality: N/A;  . HEMORRHOID SURGERY     patient reports several hemorrhoid surgeries in the past    Social History   Socioeconomic History  . Marital status: Married    Spouse name: Not on file  . Number of children: Not on file  . Years of education: Not on file  . Highest education level: Not on file  Occupational History  . Not on file  Tobacco Use  . Smoking status: Former Games developer  .  Smokeless tobacco: Never Used  Substance and Sexual Activity  . Alcohol use: Yes    Comment: "very little"  . Drug use: Never  . Sexual activity: Not on file  Other Topics Concern  . Not on file  Social History Narrative   On his third marriage x 18 years. Has 3 children and several grandchildren. His son lives in [place], [place]  and is primary contact. 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 Lives with his wife.  Former smoker.       Social Determinants of Health   Financial Resource Strain:   . Difficulty of Paying Living Expenses:   Food Insecurity:   . Worried About Programme researcher, broadcasting/film/video in the Last Year:   . Barista in the Last Year:   Transportation Needs:   . Freight forwarder (Medical):   Marland Kitchen Lack of Transportation (Non-Medical):   Physical Activity:   . Days of Exercise per Week:   . Minutes of Exercise per Session:   Stress:   . Feeling of Stress :   Social Connections:   . Frequency of Communication with Friends and Family:   . Frequency of Social Gatherings with Friends and Family:   . Attends Religious Services:   . Active Member of Clubs or Organizations:   . Attends Banker Meetings:   Marland Kitchen Marital  Status:   Intimate Partner Violence:   . Fear of Current or Ex-Partner:   . Emotionally Abused:   Marland Kitchen Physically Abused:   . Sexually Abused:    Family History  Problem Relation Age of Onset  . Colon cancer Neg Hx       VITAL SIGNS BP (!) 144/79   Pulse 65   Temp (!) 97 F (36.1 C) (Oral)   Resp 20   Ht 5\' 6"  (1.676 m)   Wt 206 lb 14.4 oz (93.8 kg)   SpO2 99%   BMI 33.39 kg/m   Outpatient Encounter Medications as of 03/28/2020  Medication Sig  . acetaminophen (TYLENOL) 325 MG tablet Take 2 tablets (650 mg total) by mouth every 6 (six) hours as needed for mild pain, fever or headache (or Fever >/= 101).  03/30/2020 albuterol (VENTOLIN HFA) 108 (90 Base) MCG/ACT inhaler Inhale 2 puffs into the lungs every 6 (six) hours as  needed for wheezing or shortness of breath.  . Amino Acids-Protein Hydrolys (FEEDING SUPPLEMENT, PRO-STAT SUGAR FREE 64,) LIQD Take 30 mLs by mouth in the morning and at bedtime.  Marland Kitchen ascorbic acid (VITAMIN C) 500 MG tablet Take 1,000 mg by mouth daily.  . Cholecalciferol (VITAMIN D3) 125 MCG (5000 UT) CAPS Take 1 capsule (5,000 Units total) by mouth daily.  Marland Kitchen ELIQUIS 5 MG TABS tablet Take 2.5 mg by mouth 2 (two) times daily.   . feeding supplement, ENSURE ENLIVE, (ENSURE ENLIVE) LIQD Take 237 mLs by mouth 2 (two) times daily between meals.  . hydrOXYzine (ATARAX/VISTARIL) 25 MG tablet Take 1 tablet (25 mg total) by mouth 3 (three) times daily as needed for anxiety.  Marland Kitchen linaclotide (LINZESS) 145 MCG CAPS capsule Take 1 capsule (145 mcg total) by mouth daily before breakfast.  . metoprolol succinate (TOPROL-XL) 25 MG 24 hr tablet Take 0.5 tablets (12.5 mg total) by mouth daily.  . NON FORMULARY May use CPAP from home with home settings. At Bedtime  . NON FORMULARY Diet: Dysphagia 2 (fine chop) diet  . Omega-3 Fatty Acids (FISH OIL BURP-LESS PO) Take 2 capsules by mouth daily.  . OXYGEN Inhale 2 L into the lungs continuous.  . pantoprazole (PROTONIX) 40 MG tablet Take 1 tablet (40 mg total) by mouth daily.  . polyethylene glycol (MIRALAX / GLYCOLAX) 17 g packet Take 17 g by mouth 2 (two) times daily.  . potassium chloride (KLOR-CON) 10 MEQ tablet Take 10 mEq by mouth daily.  . Probiotic Product (RISA-BID PROBIOTIC PO) Take 1 capsule by mouth daily.  . rosuvastatin (CRESTOR) 10 MG tablet Take 10 mg by mouth in the morning.   . senna-docusate (SENOKOT-S) 8.6-50 MG tablet Take 2 tablets by mouth at bedtime.  . sodium chloride (OCEAN) 0.65 % SOLN nasal spray Place 2 sprays into both nostrils 3 (three) times daily.  . tamsulosin (FLOMAX) 0.4 MG CAPS capsule Take 1 capsule (0.4 mg total) by mouth daily after supper.  . torsemide (DEMADEX) 20 MG tablet Take 1 tablet (20 mg total) by mouth daily.  .  Vitamin E 400 units TABS Take 2 tablets by mouth daily.    No facility-administered encounter medications on file as of 03/28/2020.     SIGNIFICANT DIAGNOSTIC EXAMS  PREVIOUS   03-08-20: chest x-ray: Stable significant elevation of the right hemidiaphragm. No acute finding.  03-08-20: ct angio of chest:  No evidence of pulmonary emboli. Small right pleural effusion with right basilar atelectasis. 5 mm nodule in the left upper lobe increased  in size from 16 years ago at which time it measured 1-2 mm. No follow-up needed if patient is low-risk. Non-contrast chest CT can be considered in 12 months if patient is high-risk.  03-09-20: 2-d echo:  Left ventricular ejection fraction, by estimation, is 60 to 65%. The  left ventricle has normal function. The left ventricle has no regional  wall motion abnormalities. There is mild concentric left ventricular  hypertrophy. Left ventricular diastolic  parameters were normal. There is the interventricular septum is flattened  in systole, consistent with right ventricular pressure overload.   03-09-20: left upper extremity doppler: No evidence of DVT within the left upper extremity.   03-13-20: chest x-ray:  Low lung volumes. Stable elevation of the right hemidiaphragm. Hazy right basilar lung opacity, which could represent atelectasis, aspiration and/or pneumonia. New blunting of the right costophrenic angle, cannot exclude small right pleural effusion.  03-14-20: ct of abdomen and pelvis:  An acute mild uncomplicated proctitis with small amount of free fluid in the presacral space and edema in the perirectal fat plane without perforation or abscess. Large stool burden throughout the colon. Sigmoid diverticulosis without acute diverticulitis or contrast extravasation within the colonic lumen. Apparent mild mucosal thickening of the gastric lumen and a left lower abdominal quadrant small bowel loop, which could be secondary to incomplete distension or a  mild gastroenteritis. Abnormal enhancement in the medial right hepatic dome and in the splenic parenchyma, possibly intrahepatic varices and splenic hemangioma, new or not apparent on the March 08, 2020 and April 18, 2004 CT chest studies. Mild cardiomegaly with four-vessel moderate to severe coronary calcifications, small bilateral pleural effusions and bibasilar compressive atelectasis. Extensive anasarca. Nonobstructing small left renal calculus. Aortic calcified atherosclerosis. Pulmonary artery hypertension.   03-17-20: EGD:- Normal esophagus. Dilated. -Gastric mucosal changes consistent with portal hypertensive gastropathy .  Gastric erosions. Status post gastric biopsy. - Normal duodenal bulb and second portion of the duodenum.    03-17-20: flexible sigmoidoscopy:  -Distal rectal ulceration with erosion involving the distal 6 to 7 cm of the rectum circumferentially -likely pressure induced phenomenon (constipation). Status post biopsy.  NO NEW EXAMS.    LABS REVIEWED PREVIOUS;   03-08-20: wbc 18.9 hgb 16.7; hct 51.1; mcv 98.1 plt 143; glucose 100; bun 29; creat 1.28; k= 3.7; na++ 140; ca 9.4; total bili 2.9; albumin 3.2; tsh 0.492; mag 2.5 03-09-20: blood cultures: no growth 03-10-20: wbc 17.0; hgb 16.4; hct 52.2; mcv 100.4 plt 108; glucose 112; bun 29; creat 1.25; k+ 3.7; an++ 140; ca 8.9  03-13-20: wbc 13.8; hgb 15.1; hct 49.4 mcv 103.1 plt 85; glucose 94; bun 21; creat 0.87; k+ 3.7; na++ 140; ca 8.1  03-14-20: wbc 19.4; hgb 16.7; hct 52.7; mcv 101.3 plt 87; glucose 114; bun 20; creat 0.74; k+ 3.9; na++ 140; ca 8.3; liver normal albumin 2.7 03-17-20: glucose 82; bun 20; creat 0.67; k+ 3.8; na++ 138; ca 8.2 liver normal albumin 2.3 03-18-20: wbc 10.4; hgb 15.9; hct 50.7 mcv 101.4; plt 84; glucose 105; bun 23; creat 0.70; k+ 4.3; na++ 140; ca 9.1  TODAY  03-24-20: wbc 7.8; hgb 13.9; hct 43.6; mcv 101.4 plt 100; glucose 99; bun 22; creat 0.80; k+ 3.7; na++ 140; ca 8.5    Review of  Systems  Constitutional: Negative for malaise/fatigue.  Respiratory: Negative for cough and shortness of breath.   Cardiovascular: Negative for chest pain, palpitations and leg swelling.  Gastrointestinal: Negative for abdominal pain, constipation and heartburn.  Musculoskeletal: Negative for back pain, joint  pain and myalgias.  Skin: Negative.   Neurological: Negative for dizziness.  Psychiatric/Behavioral: The patient is not nervous/anxious.      Physical Exam Constitutional:      General: He is not in acute distress.    Appearance: He is well-developed. He is morbidly obese. He is not diaphoretic.  Eyes:     Comments: Bilateral sclera red   Neck:     Thyroid: No thyromegaly.  Cardiovascular:     Rate and Rhythm: Normal rate. Rhythm irregular.     Pulses: Normal pulses.     Heart sounds: Normal heart sounds.  Pulmonary:     Effort: Pulmonary effort is normal. No respiratory distress.     Breath sounds: Normal breath sounds.     Comments: 02 dependent uses cpap Abdominal:     General: Bowel sounds are normal. There is no distension.     Palpations: Abdomen is soft.     Tenderness: There is no abdominal tenderness.  Musculoskeletal:     Cervical back: Neck supple.     Right lower leg: Edema present.     Left lower leg: Edema present.     Comments: Anasarca present  Is less   is able to move all extremities     Lymphadenopathy:     Cervical: No cervical adenopathy.  Skin:    General: Skin is warm and dry.  Neurological:     Mental Status: He is alert and oriented to person, place, and time.  Psychiatric:        Mood and Affect: Mood normal.       ASSESSMENT/ PLAN:  TODAY;   1. Acute on chronic congestive heart failure is preserved ejection fraction/ diastolic dysfunction: is stable EF 60-65% (03-09-20) will continue demadex 20 mg daily with k+ 10 meq daily   2. Chronic atrial fibrillation: is stable will continue toprol xl 12.5 mg daily for rate control eliquis  2.5 mg twice daily     3. OSA with CPAP; is stable uses CPAP nightly    PREVIOUS  4. Acute respiratory failure with hypoxia: is stable is 02 dependent will continue albuterl 2 puffs every 6 hours as needed   5. Dyslipidemia: is stable will continue crestor 10 mg daily and fish oil 2 gm daily   6. Benign prostatic hypertrophy with urinary urgency: is stable will continue flomax 0.4 mg daily   7. Thrombocytopenia: is stable plt 100  8. GERD without esophagitis: is stable will continue protonix 40 mg daily   9. Protein calorie malnutrition; severe: is without change albumin 2.3 will continue ensure twice daily and  prostat 30 cc twice daily   10. Proctitis/chronic constipation: is stable will continue linzess 145 mcg daily miralax 17 gm twice daily and senna s 2 tabs daily    MD is aware of resident's narcotic use and is in agreement with current plan of care. We will attempt to wean resident as appropriate.  Ok Edwards NP [facility]  Contact [phone] Monday through Friday 8am- 5pm  After hours call [phone]

## 2020-03-30 ENCOUNTER — Non-Acute Institutional Stay (SKILLED_NURSING_FACILITY): Payer: Medicare Other | Admitting: Adult Health

## 2020-03-30 ENCOUNTER — Encounter: Payer: Self-pay | Admitting: Adult Health

## 2020-03-30 DIAGNOSIS — M5410 Radiculopathy, site unspecified: Secondary | ICD-10-CM | POA: Diagnosis not present

## 2020-03-30 NOTE — Progress Notes (Signed)
Location:    [facility] Room Number: 160/P Place of Service:  SNF (31)   CODE STATUS: Full Code  No Known Allergies  Chief Complaint  Patient presents with  . Acute Visit    Pain Management    HPI:  He is having right leg pain which is radiating down his leg. The pain is worse at night and does interfere with his ability to sleep. He is able to participate in therapy and is ambulating in his room with therapy. He denies any injury.   Past Medical History:  Diagnosis Date  . A-fib (HCC)   . Cellulitis   . Hemorrhoid   . Hypertension   . Prostate disorder     Past Surgical History:  Procedure Laterality Date  . BIOPSY  03/17/2020   Procedure: BIOPSY;  Surgeon: Corbin Ade, MD;  [facility]: [facility];  Service: Endoscopy;;  gastric rectal  . ESOPHAGEAL DILATION N/A 03/17/2020   Procedure: ESOPHAGEAL DILATION;  Surgeon: Corbin Ade, MD;  [facility];  Service: Endoscopy;  Laterality: N/A;  . ESOPHAGOGASTRODUODENOSCOPY (EGD) WITH PROPOFOL N/A 03/17/2020   Procedure: ESOPHAGOGASTRODUODENOSCOPY (EGD) WITH PROPOFOL;  Surgeon: Corbin Ade, MD;  [facility];  Service: Endoscopy;  Laterality: N/A;  . FLEXIBLE SIGMOIDOSCOPY N/A 03/17/2020   Procedure: FLEXIBLE SIGMOIDOSCOPY WITH PROPOFOL;  Surgeon: Corbin Ade, MD;  [facility];  Service: Endoscopy;  Laterality: N/A;  . HEMORRHOID SURGERY     patient reports several hemorrhoid surgeries in the past    Social History   Socioeconomic History  . Marital status: Married    Spouse name: Not on file  . Number of children: Not on file  . Years of education: Not on file  . Highest education level: Not on file  Occupational History  . Not on file  Tobacco Use  . Smoking status: Former Games developer  . Smokeless tobacco: Never Used  Substance and Sexual Activity  . Alcohol use: Yes    Comment: "very little"  . Drug use: Never  . Sexual activity: Not on file    Other Topics Concern  . Not on file  Social History Narrative   On his third marriage x 18 years. Has 3 children and several grandchildren. His son lives in [place], [place]  and is primary contact. 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 Lives with his wife.  Former smoker.       Social Determinants of Health   Financial Resource Strain:   . Difficulty of Paying Living Expenses:   Food Insecurity:   . Worried About Programme researcher, broadcasting/film/video in the Last Year:   . Barista in the Last Year:   Transportation Needs:   . Freight forwarder (Medical):   Marland Kitchen Lack of Transportation (Non-Medical):   Physical Activity:   . Days of Exercise per Week:   . Minutes of Exercise per Session:   Stress:   . Feeling of Stress :   Social Connections:   . Frequency of Communication with Friends and Family:   . Frequency of Social Gatherings with Friends and Family:   . Attends Religious Services:   . Active Member of Clubs or Organizations:   . Attends Banker Meetings:   Marland Kitchen Marital Status:   Intimate Partner Violence:   . Fear of Current or Ex-Partner:   . Emotionally Abused:   Marland Kitchen Physically Abused:   .  Sexually Abused:    Family History  Problem Relation Age of Onset  . Colon cancer Neg Hx       VITAL SIGNS BP (!) 143/93   Pulse 85   Temp 98.4 F (36.9 C) (Oral)   Resp 20   Ht 5\' 6"  (1.676 m)   Wt 206 lb 14.4 oz (93.8 kg)   SpO2 93%   BMI 33.39 kg/m   Outpatient Encounter Medications as of 03/30/2020  Medication Sig  . acetaminophen (TYLENOL) 325 MG tablet Take 2 tablets (650 mg total) by mouth every 6 (six) hours as needed for mild pain, fever or headache (or Fever >/= 101).  04/01/2020 albuterol (VENTOLIN HFA) 108 (90 Base) MCG/ACT inhaler Inhale 2 puffs into the lungs every 6 (six) hours as needed for wheezing or shortness of breath.  . Amino Acids-Protein Hydrolys (FEEDING SUPPLEMENT, PRO-STAT SUGAR FREE 64,) LIQD Take 30 mLs by mouth in the morning and  at bedtime.  Marland Kitchen ascorbic acid (VITAMIN C) 500 MG tablet Take 1,000 mg by mouth daily.  . Cholecalciferol (VITAMIN D3) 125 MCG (5000 UT) CAPS Take 1 capsule (5,000 Units total) by mouth daily.  Marland Kitchen ELIQUIS 5 MG TABS tablet Take 2.5 mg by mouth 2 (two) times daily.   . feeding supplement, ENSURE ENLIVE, (ENSURE ENLIVE) LIQD Take 237 mLs by mouth 2 (two) times daily between meals.  Marland Kitchen linaclotide (LINZESS) 145 MCG CAPS capsule Take 1 capsule (145 mcg total) by mouth daily before breakfast.  . metoprolol succinate (TOPROL-XL) 25 MG 24 hr tablet Take 0.5 tablets (12.5 mg total) by mouth daily.  . NON FORMULARY May use CPAP from home with home settings. At Bedtime  . NON FORMULARY Diet Change: Dys 2 (ground), thin liquids; will allow egg salad sandwich  . Omega-3 Fatty Acids (FISH OIL BURP-LESS PO) Take 2 capsules by mouth daily.  . pantoprazole (PROTONIX) 40 MG tablet Take 1 tablet (40 mg total) by mouth daily.  . polyethylene glycol (MIRALAX / GLYCOLAX) 17 g packet Take 17 g by mouth 2 (two) times daily.  . potassium chloride (KLOR-CON) 10 MEQ tablet Take 10 mEq by mouth daily.  . Probiotic Product (RISA-BID PROBIOTIC PO) Take 1 capsule by mouth daily.  . rosuvastatin (CRESTOR) 10 MG tablet Take 10 mg by mouth in the morning.   . senna-docusate (SENOKOT-S) 8.6-50 MG tablet Take 2 tablets by mouth at bedtime.  . sodium chloride (OCEAN) 0.65 % SOLN nasal spray Place 2 sprays into both nostrils 3 (three) times daily.  . tamsulosin (FLOMAX) 0.4 MG CAPS capsule Take 1 capsule (0.4 mg total) by mouth daily after supper.  . torsemide (DEMADEX) 20 MG tablet Take 1 tablet (20 mg total) by mouth daily.  . Vitamin E 400 units TABS Take 2 tablets by mouth daily.   . [DISCONTINUED] hydrOXYzine (ATARAX/VISTARIL) 25 MG tablet Take 1 tablet (25 mg total) by mouth 3 (three) times daily as needed for anxiety.  . [DISCONTINUED] OXYGEN Inhale 2 L into the lungs continuous.   No facility-administered encounter  medications on file as of 03/30/2020.     SIGNIFICANT DIAGNOSTIC EXAMS   PREVIOUS   03-08-20: chest x-ray: Stable significant elevation of the right hemidiaphragm. No acute finding.  03-08-20: ct angio of chest:  No evidence of pulmonary emboli. Small right pleural effusion with right basilar atelectasis. 5 mm nodule in the left upper lobe increased in size from 16 years ago at which time it measured 1-2 mm. No follow-up needed if patient is  low-risk. Non-contrast chest CT can be considered in 12 months if patient is high-risk.  03-09-20: 2-d echo:  Left ventricular ejection fraction, by estimation, is 60 to 65%. The  left ventricle has normal function. The left ventricle has no regional  wall motion abnormalities. There is mild concentric left ventricular  hypertrophy. Left ventricular diastolic  parameters were normal. There is the interventricular septum is flattened  in systole, consistent with right ventricular pressure overload.   03-09-20: left upper extremity doppler: No evidence of DVT within the left upper extremity.   03-13-20: chest x-ray:  Low lung volumes. Stable elevation of the right hemidiaphragm. Hazy right basilar lung opacity, which could represent atelectasis, aspiration and/or pneumonia. New blunting of the right costophrenic angle, cannot exclude small right pleural effusion.  03-14-20: ct of abdomen and pelvis:  An acute mild uncomplicated proctitis with small amount of free fluid in the presacral space and edema in the perirectal fat plane without perforation or abscess. Large stool burden throughout the colon. Sigmoid diverticulosis without acute diverticulitis or contrast extravasation within the colonic lumen. Apparent mild mucosal thickening of the gastric lumen and a left lower abdominal quadrant small bowel loop, which could be secondary to incomplete distension or a mild gastroenteritis. Abnormal enhancement in the medial right hepatic dome and in the splenic  parenchyma, possibly intrahepatic varices and splenic hemangioma, new or not apparent on the March 08, 2020 and April 18, 2004 CT chest studies. Mild cardiomegaly with four-vessel moderate to severe coronary calcifications, small bilateral pleural effusions and bibasilar compressive atelectasis. Extensive anasarca. Nonobstructing small left renal calculus. Aortic calcified atherosclerosis. Pulmonary artery hypertension.   03-17-20: EGD:- Normal esophagus. Dilated. -Gastric mucosal changes consistent with portal hypertensive gastropathy .  Gastric erosions. Status post gastric biopsy. - Normal duodenal bulb and second portion of the duodenum.    03-17-20: flexible sigmoidoscopy:  -Distal rectal ulceration with erosion involving the distal 6 to 7 cm of the rectum circumferentially -likely pressure induced phenomenon (constipation). Status post biopsy.  NO NEW EXAMS.    LABS REVIEWED PREVIOUS;   03-08-20: wbc 18.9 hgb 16.7; hct 51.1; mcv 98.1 plt 143; glucose 100; bun 29; creat 1.28; k= 3.7; na++ 140; ca 9.4; total bili 2.9; albumin 3.2; tsh 0.492; mag 2.5 03-09-20: blood cultures: no growth 03-10-20: wbc 17.0; hgb 16.4; hct 52.2; mcv 100.4 plt 108; glucose 112; bun 29; creat 1.25; k+ 3.7; an++ 140; ca 8.9  03-13-20: wbc 13.8; hgb 15.1; hct 49.4 mcv 103.1 plt 85; glucose 94; bun 21; creat 0.87; k+ 3.7; na++ 140; ca 8.1  03-14-20: wbc 19.4; hgb 16.7; hct 52.7; mcv 101.3 plt 87; glucose 114; bun 20; creat 0.74; k+ 3.9; na++ 140; ca 8.3; liver normal albumin 2.7 03-17-20: glucose 82; bun 20; creat 0.67; k+ 3.8; na++ 138; ca 8.2 liver normal albumin 2.3 03-18-20: wbc 10.4; hgb 15.9; hct 50.7 mcv 101.4; plt 84; glucose 105; bun 23; creat 0.70; k+ 4.3; na++ 140; ca 9.1 03-24-20: wbc 7.8; hgb 13.9; hct 43.6; mcv 101.4 plt 100; glucose 99; bun 22; creat 0.80; k+ 3.7; na++ 140; ca 8.5   NO NEW LABS   Review of Systems  Constitutional: Negative for malaise/fatigue.  Respiratory: Negative for cough and  shortness of breath.   Cardiovascular: Negative for chest pain, palpitations and leg swelling.  Gastrointestinal: Negative for abdominal pain, constipation and heartburn.  Musculoskeletal: Positive for myalgias. Negative for back pain and joint pain.       Right lower extremity pain   Skin:  Negative.   Neurological: Negative for dizziness.  Psychiatric/Behavioral: The patient is not nervous/anxious.     Physical Exam Constitutional:      General: He is not in acute distress.    Appearance: He is well-developed. He is morbidly obese. He is not diaphoretic.  Neck:     Thyroid: No thyromegaly.  Cardiovascular:     Rate and Rhythm: Normal rate. Rhythm irregular.     Pulses: Normal pulses.     Heart sounds: Normal heart sounds.  Pulmonary:     Effort: Pulmonary effort is normal. No respiratory distress.     Breath sounds: Normal breath sounds.     Comments: 02 dependent uses cpap Abdominal:     General: Bowel sounds are normal. There is no distension.     Palpations: Abdomen is soft.     Tenderness: There is no abdominal tenderness.  Musculoskeletal:     Cervical back: Neck supple.     Right lower leg: Edema present.     Left lower leg: Edema present.     Comments: Anasarca present  Is less   is able to move all extremities      Lymphadenopathy:     Cervical: No cervical adenopathy.  Skin:    General: Skin is warm and dry.  Neurological:     Mental Status: He is alert and oriented to person, place, and time.  Psychiatric:        Mood and Affect: Mood normal.        ASSESSMENT/ PLAN:  TODAY  1. Radiculopathy of right leg: is worse will begin gabapentin 100 mg nightly and will monitor his status.     MD is aware of resident's narcotic use and is in agreement with current plan of care. We will attempt to wean resident as appropriate.  Synthia Innocent NP [facility]  Contact [phone] Monday through Friday 8am- 5pm  After hours call [phone]

## 2020-04-06 DIAGNOSIS — M5410 Radiculopathy, site unspecified: Secondary | ICD-10-CM | POA: Insufficient documentation

## 2020-04-07 ENCOUNTER — Encounter: Payer: Self-pay | Admitting: Adult Health

## 2020-04-07 ENCOUNTER — Non-Acute Institutional Stay (SKILLED_NURSING_FACILITY): Payer: Medicare Other | Admitting: Adult Health

## 2020-04-07 DIAGNOSIS — J9601 Acute respiratory failure with hypoxia: Secondary | ICD-10-CM | POA: Diagnosis not present

## 2020-04-07 DIAGNOSIS — I5033 Acute on chronic diastolic (congestive) heart failure: Secondary | ICD-10-CM | POA: Diagnosis not present

## 2020-04-07 DIAGNOSIS — I482 Chronic atrial fibrillation, unspecified: Secondary | ICD-10-CM | POA: Diagnosis not present

## 2020-04-07 NOTE — Progress Notes (Signed)
Location:    [facility] Room Number: 151/P Place of Service:  SNF (31)   CODE STATUS: Full Code  No Known Allergies  Chief Complaint  Patient presents with  . Acute Visit    care plan     HPI:  We have come together for his care plan meeting. BIMS 11/15 mood 6/30. Family is present Is off the 02. No falls. Weight is stable. Is ambulating with therapy; BRP with therapy; fewer incontinence episodes. His family has appealed his nomnic. There are no repots of uncontrolled pain. His goal is to return home.   Past Medical History:  Diagnosis Date  . A-fib (HCC)   . Cellulitis   . Hemorrhoid   . Hypertension   . Prostate disorder     Past Surgical History:  Procedure Laterality Date  . BIOPSY  03/17/2020   Procedure: BIOPSY;  Surgeon: Corbin Ade, MD;  [facility];  Service: Endoscopy;;  gastric rectal  . ESOPHAGEAL DILATION N/A 03/17/2020   Procedure: ESOPHAGEAL DILATION;  Surgeon: Corbin Ade, MD;  [facility];  Service: Endoscopy;  Laterality: N/A;  . ESOPHAGOGASTRODUODENOSCOPY (EGD) WITH PROPOFOL N/A 03/17/2020   Procedure: ESOPHAGOGASTRODUODENOSCOPY (EGD) WITH PROPOFOL;  Surgeon: Corbin Ade, MD;  [facility];  Service: Endoscopy;  Laterality: N/A;  . FLEXIBLE SIGMOIDOSCOPY N/A 03/17/2020   Procedure: FLEXIBLE SIGMOIDOSCOPY WITH PROPOFOL;  Surgeon: Corbin Ade, MD;  [facility];  Service: Endoscopy;  Laterality: N/A;  . HEMORRHOID SURGERY     patient reports several hemorrhoid surgeries in the past    Social History   Socioeconomic History  . Marital status: Married    Spouse name: Not on file  . Number of children: Not on file  . Years of education: Not on file  . Highest education level: Not on file  Occupational History  . Not on file  Tobacco Use  . Smoking status: Former Games developer  . Smokeless tobacco: Never Used  Substance and Sexual Activity  . Alcohol use: Yes    Comment:  "very little"  . Drug use: Never  . Sexual activity: Not on file  Other Topics Concern  . Not on file  Social History Narrative   On his third marriage x 18 years. Has 3 children and several grandchildren. His son lives in [place], [place]  and is primary contact. 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  Former smoker.       Social Determinants of Health   Financial Resource Strain:   . Difficulty of Paying Living Expenses:   Food Insecurity:   . Worried About Programme researcher, broadcasting/film/video in the Last Year:   . Barista in the Last Year:   Transportation Needs:   . Freight forwarder (Medical):   Marland Kitchen Lack of Transportation (Non-Medical):   Physical Activity:   . Days of Exercise per Week:   . Minutes of Exercise per Session:   Stress:   . Feeling of Stress :   Social Connections:   . Frequency of Communication with Friends and Family:   . Frequency of Social Gatherings with Friends and Family:   . Attends Religious Services:   . Active Member of Clubs or Organizations:   . Attends Banker Meetings:   Marland Kitchen Marital Status:   Intimate Partner Violence:   . Fear of Current or Ex-Partner:   . Emotionally  Abused:   Marland Kitchen Physically Abused:   . Sexually Abused:    Family History  Problem Relation Age of Onset  . Colon cancer Neg Hx       VITAL SIGNS BP 140/70   Pulse 66   Temp 98.1 F (36.7 C) (Oral)   Resp 20   Ht 5\' 6"  (1.676 m)   Wt 201 lb 3.2 oz (91.3 kg)   SpO2 94%   BMI 32.47 kg/m   Outpatient Encounter Medications as of 04/07/2020  Medication Sig  . acetaminophen (TYLENOL) 325 MG tablet Take 2 tablets (650 mg total) by mouth every 6 (six) hours as needed for mild pain, fever or headache (or Fever >/= 101).  Marland Kitchen albuterol (VENTOLIN HFA) 108 (90 Base) MCG/ACT inhaler Inhale 2 puffs into the lungs every 6 (six) hours as needed for wheezing or shortness of breath.  . Amino Acids-Protein Hydrolys (FEEDING SUPPLEMENT, PRO-STAT  SUGAR FREE 64,) LIQD Take 30 mLs by mouth in the morning and at bedtime.  Marland Kitchen ascorbic acid (VITAMIN C) 500 MG tablet Take 1,000 mg by mouth daily.  . Cholecalciferol (VITAMIN D3) 125 MCG (5000 UT) CAPS Take 1 capsule (5,000 Units total) by mouth daily.  Marland Kitchen ELIQUIS 5 MG TABS tablet Take 2.5 mg by mouth 2 (two) times daily.   . feeding supplement, ENSURE ENLIVE, (ENSURE ENLIVE) LIQD Take 237 mLs by mouth 2 (two) times daily between meals.  . gabapentin (NEURONTIN) 100 MG capsule Take 100 mg by mouth at bedtime.  Marland Kitchen linaclotide (LINZESS) 145 MCG CAPS capsule Take 1 capsule (145 mcg total) by mouth daily before breakfast.  . metoprolol succinate (TOPROL-XL) 25 MG 24 hr tablet Take 0.5 tablets (12.5 mg total) by mouth daily.  . NON FORMULARY May use CPAP from home with home settings. At Bedtime  . NON FORMULARY Diet Change: Regular, continue thin liquids  . Omega-3 Fatty Acids (FISH OIL BURP-LESS PO) Take 2 capsules by mouth daily.  . pantoprazole (PROTONIX) 40 MG tablet Take 1 tablet (40 mg total) by mouth daily.  . polyethylene glycol (MIRALAX / GLYCOLAX) 17 g packet Take 17 g by mouth 2 (two) times daily.  . potassium chloride (KLOR-CON) 10 MEQ tablet Take 10 mEq by mouth daily.  . Probiotic Product (RISA-BID PROBIOTIC PO) Take 1 capsule by mouth daily.  . rosuvastatin (CRESTOR) 10 MG tablet Take 10 mg by mouth in the morning.   . senna-docusate (SENOKOT-S) 8.6-50 MG tablet Take 2 tablets by mouth at bedtime.  . sodium chloride (OCEAN) 0.65 % SOLN nasal spray Place 2 sprays into both nostrils 3 (three) times daily.  . tamsulosin (FLOMAX) 0.4 MG CAPS capsule Take 1 capsule (0.4 mg total) by mouth daily after supper.  . torsemide (DEMADEX) 20 MG tablet Take 1 tablet (20 mg total) by mouth daily.  . Vitamin E 400 units TABS Take 2 tablets by mouth daily.    No facility-administered encounter medications on file as of 04/07/2020.     SIGNIFICANT DIAGNOSTIC EXAMS  PREVIOUS   03-08-20: chest x-ray:  Stable significant elevation of the right hemidiaphragm. No acute finding.  03-08-20: ct angio of chest:  No evidence of pulmonary emboli. Small right pleural effusion with right basilar atelectasis. 5 mm nodule in the left upper lobe increased in size from 16 years ago at which time it measured 1-2 mm. No follow-up needed if patient is low-risk. Non-contrast chest CT can be considered in 12 months if patient is high-risk.  03-09-20: 2-d echo:  Left  ventricular ejection fraction, by estimation, is 60 to 65%. The  left ventricle has normal function. The left ventricle has no regional  wall motion abnormalities. There is mild concentric left ventricular  hypertrophy. Left ventricular diastolic  parameters were normal. There is the interventricular septum is flattened  in systole, consistent with right ventricular pressure overload.   03-09-20: left upper extremity doppler: No evidence of DVT within the left upper extremity.   03-13-20: chest x-ray:  Low lung volumes. Stable elevation of the right hemidiaphragm. Hazy right basilar lung opacity, which could represent atelectasis, aspiration and/or pneumonia. New blunting of the right costophrenic angle, cannot exclude small right pleural effusion.  03-14-20: ct of abdomen and pelvis:  An acute mild uncomplicated proctitis with small amount of free fluid in the presacral space and edema in the perirectal fat plane without perforation or abscess. Large stool burden throughout the colon. Sigmoid diverticulosis without acute diverticulitis or contrast extravasation within the colonic lumen. Apparent mild mucosal thickening of the gastric lumen and a left lower abdominal quadrant small bowel loop, which could be secondary to incomplete distension or a mild gastroenteritis. Abnormal enhancement in the medial right hepatic dome and in the splenic parenchyma, possibly intrahepatic varices and splenic hemangioma, new or not apparent on the March 08, 2020 and  April 18, 2004 CT chest studies. Mild cardiomegaly with four-vessel moderate to severe coronary calcifications, small bilateral pleural effusions and bibasilar compressive atelectasis. Extensive anasarca. Nonobstructing small left renal calculus. Aortic calcified atherosclerosis. Pulmonary artery hypertension.   03-17-20: EGD:- Normal esophagus. Dilated. -Gastric mucosal changes consistent with portal hypertensive gastropathy .  Gastric erosions. Status post gastric biopsy. - Normal duodenal bulb and second portion of the duodenum.    03-17-20: flexible sigmoidoscopy:  -Distal rectal ulceration with erosion involving the distal 6 to 7 cm of the rectum circumferentially -likely pressure induced phenomenon (constipation). Status post biopsy.  NO NEW EXAMS.    LABS REVIEWED PREVIOUS;   03-08-20: wbc 18.9 hgb 16.7; hct 51.1; mcv 98.1 plt 143; glucose 100; bun 29; creat 1.28; k= 3.7; na++ 140; ca 9.4; total bili 2.9; albumin 3.2; tsh 0.492; mag 2.5 03-09-20: blood cultures: no growth 03-10-20: wbc 17.0; hgb 16.4; hct 52.2; mcv 100.4 plt 108; glucose 112; bun 29; creat 1.25; k+ 3.7; an++ 140; ca 8.9  03-13-20: wbc 13.8; hgb 15.1; hct 49.4 mcv 103.1 plt 85; glucose 94; bun 21; creat 0.87; k+ 3.7; na++ 140; ca 8.1  03-14-20: wbc 19.4; hgb 16.7; hct 52.7; mcv 101.3 plt 87; glucose 114; bun 20; creat 0.74; k+ 3.9; na++ 140; ca 8.3; liver normal albumin 2.7 03-17-20: glucose 82; bun 20; creat 0.67; k+ 3.8; na++ 138; ca 8.2 liver normal albumin 2.3 03-18-20: wbc 10.4; hgb 15.9; hct 50.7 mcv 101.4; plt 84; glucose 105; bun 23; creat 0.70; k+ 4.3; na++ 140; ca 9.1 03-24-20: wbc 7.8; hgb 13.9; hct 43.6; mcv 101.4 plt 100; glucose 99; bun 22; creat 0.80; k+ 3.7; na++ 140; ca 8.5   NO NEW LABS    Review of Systems  Constitutional: Negative for malaise/fatigue.  Respiratory: Negative for cough and shortness of breath.   Cardiovascular: Negative for chest pain, palpitations and leg swelling.    Gastrointestinal: Negative for abdominal pain, constipation and heartburn.  Musculoskeletal: Negative for back pain, joint pain and myalgias.  Skin: Negative.   Neurological: Negative for dizziness.  Psychiatric/Behavioral: The patient is not nervous/anxious.     Physical Exam Constitutional:      General: He is not in  acute distress.    Appearance: He is well-developed. He is obese. He is not diaphoretic.  Neck:     Thyroid: No thyromegaly.  Cardiovascular:     Rate and Rhythm: Normal rate. Rhythm irregular.     Heart sounds: Normal heart sounds.  Pulmonary:     Effort: Pulmonary effort is normal. No respiratory distress.     Breath sounds: Normal breath sounds.     Comments: Uses CPAP  Abdominal:     General: Bowel sounds are normal. There is no distension.     Palpations: Abdomen is soft.     Tenderness: There is no abdominal tenderness.  Musculoskeletal:        General: Normal range of motion.     Cervical back: Neck supple.     Right lower leg: No edema.     Left lower leg: No edema.  Lymphadenopathy:     Cervical: No cervical adenopathy.  Skin:    General: Skin is warm and dry.  Neurological:     Mental Status: He is alert and oriented to person, place, and time.  Psychiatric:        Mood and Affect: Mood normal.       ASSESSMENT/ PLAN:  TODAY  1. Acute on chronic heart failure with preserved ejection fraction/diastolic dysfunction 2. Chronic atrial fibrillation 3. Acute respiratory failure with hypoxia  Is off 02 Will continue therapy as directed Will continue current medications Will continue to monitor his status.  His goal is to return back home.      MD is aware of resident's narcotic use and is in agreement with current plan of care. We will attempt to wean resident as appropriate.  Synthia Innocent NP [facility]  Contact [phone] Monday through Friday 8am- 5pm  After hours call [phone]

## 2020-04-08 ENCOUNTER — Encounter: Payer: Self-pay | Admitting: Adult Health

## 2020-04-08 ENCOUNTER — Non-Acute Institutional Stay (SKILLED_NURSING_FACILITY): Payer: Medicare Other | Admitting: Adult Health

## 2020-04-08 ENCOUNTER — Other Ambulatory Visit: Payer: Self-pay | Admitting: Adult Health

## 2020-04-08 DIAGNOSIS — J9601 Acute respiratory failure with hypoxia: Secondary | ICD-10-CM

## 2020-04-08 DIAGNOSIS — I5033 Acute on chronic diastolic (congestive) heart failure: Secondary | ICD-10-CM

## 2020-04-08 DIAGNOSIS — E43 Unspecified severe protein-calorie malnutrition: Secondary | ICD-10-CM

## 2020-04-08 DIAGNOSIS — I872 Venous insufficiency (chronic) (peripheral): Secondary | ICD-10-CM

## 2020-04-08 MED ORDER — GABAPENTIN 100 MG PO CAPS: 100.0000 mg | ORAL_CAPSULE | Freq: Every evening | ORAL | 0 refills | Status: DC

## 2020-04-08 MED ORDER — LINACLOTIDE 145 MCG PO CAPS: 145.0000 ug | ORAL_CAPSULE | Freq: Every day | ORAL | 0 refills | Status: DC

## 2020-04-08 MED ORDER — POTASSIUM CHLORIDE ER 10 MEQ PO TBCR: 10.0000 meq | EXTENDED_RELEASE_TABLET | Freq: Every day | ORAL | 0 refills | Status: DC

## 2020-04-08 MED ORDER — ROSUVASTATIN CALCIUM 10 MG PO TABS: 10.0000 mg | ORAL_TABLET | Freq: Every morning | ORAL | 0 refills | Status: DC

## 2020-04-08 MED ORDER — ALBUTEROL SULFATE HFA 108 (90 BASE) MCG/ACT IN AERS: 2.0000 | INHALATION_SPRAY | Freq: Four times a day (QID) | RESPIRATORY_TRACT | 0 refills | Status: DC | PRN

## 2020-04-08 MED ORDER — TAMSULOSIN HCL 0.4 MG PO CAPS: 0.4000 mg | ORAL_CAPSULE | Freq: Every day | ORAL | 0 refills | Status: DC

## 2020-04-08 MED ORDER — METOPROLOL SUCCINATE ER 25 MG PO TB24: 12.5000 mg | ORAL_TABLET | Freq: Every day | ORAL | 0 refills | Status: DC

## 2020-04-08 MED ORDER — PANTOPRAZOLE SODIUM 40 MG PO TBEC: 40.0000 mg | DELAYED_RELEASE_TABLET | Freq: Every day | ORAL | 0 refills | Status: DC

## 2020-04-08 MED ORDER — TORSEMIDE 20 MG PO TABS: 20.0000 mg | ORAL_TABLET | Freq: Every day | ORAL | 0 refills | Status: DC

## 2020-04-08 MED ORDER — APIXABAN 2.5 MG PO TABS: 2.5000 mg | ORAL_TABLET | Freq: Two times a day (BID) | ORAL | 0 refills | Status: DC

## 2020-04-08 NOTE — Progress Notes (Signed)
Location:    [facility] Room Number: 151/P Place of Service:  SNF (31)    CODE STATUS: Full Code  No Known Allergies  Chief Complaint  Patient presents with  . Discharge Note    Discharge Visit    HPI:  He is being discharged to home with home health for pt/ot/rn/cna. He will need a standard wheelchair. He will need his prescription written and will need to follow up with his pcp. He had been hospitalized for heart failure; respiratory failure and weakness. He was admitted to this facility for short term rehab to improve upon his level of independence with his adls. He has participated in pt/ot has had wound management for his venous stasis ulceration. He will be ready to complete therapy on a home health basis.    Past Medical History:  Diagnosis Date  . A-fib ([place])   . Cellulitis   . Hemorrhoid   . Hypertension   . Prostate disorder     Past Surgical History:  Procedure Laterality Date  . BIOPSY  03/17/2020   Procedure: BIOPSY;  Surgeon: Daneil Dolin, MD;  [facility];  Service: Endoscopy;;  gastric rectal  . ESOPHAGEAL DILATION N/A 03/17/2020   Procedure: ESOPHAGEAL DILATION;  Surgeon: Daneil Dolin, MD;  [facility];  Service: Endoscopy;  Laterality: N/A;  . ESOPHAGOGASTRODUODENOSCOPY (EGD) WITH PROPOFOL N/A 03/17/2020   Procedure: ESOPHAGOGASTRODUODENOSCOPY (EGD) WITH PROPOFOL;  Surgeon: Daneil Dolin, MD;  [facility];  Service: Endoscopy;  Laterality: N/A;  . FLEXIBLE SIGMOIDOSCOPY N/A 03/17/2020   Procedure: FLEXIBLE SIGMOIDOSCOPY WITH PROPOFOL;  Surgeon: Daneil Dolin, MD;  [facility];  Service: Endoscopy;  Laterality: N/A;  . HEMORRHOID SURGERY     patient reports several hemorrhoid surgeries in the past    Social History   Socioeconomic History  . Marital status: Married    Spouse name: Not on file  . Number of children: Not on file  . Years of education: Not on file  . Highest  education level: Not on file  Occupational History  . Not on file  Tobacco Use  . Smoking status: Former Research scientist (life sciences)  . Smokeless tobacco: Never Used  Substance and Sexual Activity  . Alcohol use: Yes    Comment: "very little"  . Drug use: Never  . Sexual activity: Not on file  Other Topics Concern  . Not on file  Social History Narrative   On his third marriage x 18 years. Has 3 children and several grandchildren. His son lives in [place], [place]  and is primary contact. 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 Lives with his wife.  Former smoker.       Social Determinants of Health   Financial Resource Strain:   . Difficulty of Paying Living Expenses:   Food Insecurity:   . Worried About Charity fundraiser in the Last Year:   . Arboriculturist in the Last Year:   Transportation Needs:   . Film/video editor (Medical):   Marland Kitchen Lack of Transportation (Non-Medical):   Physical Activity:   . Days of Exercise per Week:   . Minutes of Exercise per Session:   Stress:   . Feeling of Stress :   Social Connections:   . Frequency of Communication with Friends and Family:   . Frequency of Social Gatherings with Friends and Family:   . Attends Religious Services:   .  Active Member of Clubs or Organizations:   . Attends Banker Meetings:   Marland Kitchen Marital Status:   Intimate Partner Violence:   . Fear of Current or Ex-Partner:   . Emotionally Abused:   Marland Kitchen Physically Abused:   . Sexually Abused:    Family History  Problem Relation Age of Onset  . Colon cancer Neg Hx     VITAL SIGNS BP 140/86   Pulse 74   Temp 98.2 F (36.8 C) (Oral)   Resp 20   Ht 5\' 6"  (1.676 m)   Wt 201 lb 3.2 oz (91.3 kg)   SpO2 93%   BMI 32.47 kg/m   Patient's Medications  New Prescriptions   No medications on file  Previous Medications   ACETAMINOPHEN (TYLENOL) 325 MG TABLET    Take 2 tablets (650 mg total) by mouth every 6 (six) hours as needed for mild pain, fever or headache  (or Fever >/= 101).   ALBUTEROL (VENTOLIN HFA) 108 (90 BASE) MCG/ACT INHALER    Inhale 2 puffs into the lungs every 6 (six) hours as needed for wheezing or shortness of breath.   AMINO ACIDS-PROTEIN HYDROLYS (FEEDING SUPPLEMENT, PRO-STAT SUGAR FREE 64,) LIQD    Take 30 mLs by mouth in the morning and at bedtime.   APIXABAN (ELIQUIS) 2.5 MG TABS TABLET    Take 1 tablet (2.5 mg total) by mouth 2 (two) times daily.   ASCORBIC ACID (VITAMIN C) 500 MG TABLET    Take 1,000 mg by mouth daily.   CHOLECALCIFEROL (VITAMIN D3) 125 MCG (5000 UT) CAPS    Take 1 capsule (5,000 Units total) by mouth daily.   FEEDING SUPPLEMENT, ENSURE ENLIVE, (ENSURE ENLIVE) LIQD    Take 237 mLs by mouth 2 (two) times daily between meals.   GABAPENTIN (NEURONTIN) 100 MG CAPSULE    Take 1 capsule (100 mg total) by mouth at bedtime.   LINACLOTIDE (LINZESS) 145 MCG CAPS CAPSULE    Take 1 capsule (145 mcg total) by mouth daily before breakfast.   METOPROLOL SUCCINATE (TOPROL-XL) 25 MG 24 HR TABLET    Take 0.5 tablets (12.5 mg total) by mouth daily.   NON FORMULARY    May use CPAP from home with home settings. At Bedtime   NON FORMULARY    Diet Change: Regular, continue thin liquids   OMEGA-3 FATTY ACIDS (FISH OIL BURP-LESS PO)    Take 2 capsules by mouth daily.   PANTOPRAZOLE (PROTONIX) 40 MG TABLET    Take 1 tablet (40 mg total) by mouth daily.   POLYETHYLENE GLYCOL (MIRALAX / GLYCOLAX) 17 G PACKET    Take 17 g by mouth 2 (two) times daily.   POTASSIUM CHLORIDE (KLOR-CON) 10 MEQ TABLET    Take 1 tablet (10 mEq total) by mouth daily.   PROBIOTIC PRODUCT (RISA-BID PROBIOTIC PO)    Take 1 capsule by mouth daily.   ROSUVASTATIN (CRESTOR) 10 MG TABLET    Take 1 tablet (10 mg total) by mouth in the morning.   SENNA-DOCUSATE (SENOKOT-S) 8.6-50 MG TABLET    Take 2 tablets by mouth at bedtime.   SODIUM CHLORIDE (OCEAN) 0.65 % SOLN NASAL SPRAY    Place 2 sprays into both nostrils 3 (three) times daily.   TAMSULOSIN (FLOMAX) 0.4 MG CAPS  CAPSULE    Take 1 capsule (0.4 mg total) by mouth daily after supper.   TORSEMIDE (DEMADEX) 20 MG TABLET    Take 1 tablet (20 mg total) by mouth daily.  VITAMIN E 400 UNITS TABS    Take 2 tablets by mouth daily.   Modified Medications   No medications on file  Discontinued Medications   No medications on file     SIGNIFICANT DIAGNOSTIC EXAMS   PREVIOUS   03-08-20: chest x-ray: Stable significant elevation of the right hemidiaphragm. No acute finding.  03-08-20: ct angio of chest:  No evidence of pulmonary emboli. Small right pleural effusion with right basilar atelectasis. 5 mm nodule in the left upper lobe increased in size from 16 years ago at which time it measured 1-2 mm. No follow-up needed if patient is low-risk. Non-contrast chest CT can be considered in 12 months if patient is high-risk.  03-09-20: 2-d echo:  Left ventricular ejection fraction, by estimation, is 60 to 65%. The  left ventricle has normal function. The left ventricle has no regional  wall motion abnormalities. There is mild concentric left ventricular  hypertrophy. Left ventricular diastolic  parameters were normal. There is the interventricular septum is flattened  in systole, consistent with right ventricular pressure overload.   03-09-20: left upper extremity doppler: No evidence of DVT within the left upper extremity.   03-13-20: chest x-ray:  Low lung volumes. Stable elevation of the right hemidiaphragm. Hazy right basilar lung opacity, which could represent atelectasis, aspiration and/or pneumonia. New blunting of the right costophrenic angle, cannot exclude small right pleural effusion.  03-14-20: ct of abdomen and pelvis:  An acute mild uncomplicated proctitis with small amount of free fluid in the presacral space and edema in the perirectal fat plane without perforation or abscess. Large stool burden throughout the colon. Sigmoid diverticulosis without acute diverticulitis or contrast extravasation within  the colonic lumen. Apparent mild mucosal thickening of the gastric lumen and a left lower abdominal quadrant small bowel loop, which could be secondary to incomplete distension or a mild gastroenteritis. Abnormal enhancement in the medial right hepatic dome and in the splenic parenchyma, possibly intrahepatic varices and splenic hemangioma, new or not apparent on the March 08, 2020 and April 18, 2004 CT chest studies. Mild cardiomegaly with four-vessel moderate to severe coronary calcifications, small bilateral pleural effusions and bibasilar compressive atelectasis. Extensive anasarca. Nonobstructing small left renal calculus. Aortic calcified atherosclerosis. Pulmonary artery hypertension.   03-17-20: EGD:- Normal esophagus. Dilated. -Gastric mucosal changes consistent with portal hypertensive gastropathy .  Gastric erosions. Status post gastric biopsy. - Normal duodenal bulb and second portion of the duodenum.    03-17-20: flexible sigmoidoscopy:  -Distal rectal ulceration with erosion involving the distal 6 to 7 cm of the rectum circumferentially -likely pressure induced phenomenon (constipation). Status post biopsy.  NO NEW EXAMS.    LABS REVIEWED PREVIOUS;   03-08-20: wbc 18.9 hgb 16.7; hct 51.1; mcv 98.1 plt 143; glucose 100; bun 29; creat 1.28; k= 3.7; na++ 140; ca 9.4; total bili 2.9; albumin 3.2; tsh 0.492; mag 2.5 03-09-20: blood cultures: no growth 03-10-20: wbc 17.0; hgb 16.4; hct 52.2; mcv 100.4 plt 108; glucose 112; bun 29; creat 1.25; k+ 3.7; an++ 140; ca 8.9  03-13-20: wbc 13.8; hgb 15.1; hct 49.4 mcv 103.1 plt 85; glucose 94; bun 21; creat 0.87; k+ 3.7; na++ 140; ca 8.1  03-14-20: wbc 19.4; hgb 16.7; hct 52.7; mcv 101.3 plt 87; glucose 114; bun 20; creat 0.74; k+ 3.9; na++ 140; ca 8.3; liver normal albumin 2.7 03-17-20: glucose 82; bun 20; creat 0.67; k+ 3.8; na++ 138; ca 8.2 liver normal albumin 2.3 03-18-20: wbc 10.4; hgb 15.9; hct 50.7 mcv 101.4; plt  84; glucose 105; bun 23;  creat 0.70; k+ 4.3; na++ 140; ca 9.1 03-24-20: wbc 7.8; hgb 13.9; hct 43.6; mcv 101.4 plt 100; glucose 99; bun 22; creat 0.80; k+ 3.7; na++ 140; ca 8.5   NO NEW LABS   Review of Systems  Constitutional: Negative for malaise/fatigue.  Respiratory: Negative for cough and shortness of breath.   Cardiovascular: Negative for chest pain, palpitations and leg swelling.  Gastrointestinal: Negative for abdominal pain, constipation and heartburn.  Musculoskeletal: Negative for back pain, joint pain and myalgias.  Skin: Negative.   Neurological: Negative for dizziness.  Psychiatric/Behavioral: The patient is not nervous/anxious.     Physical Exam Constitutional:      General: He is not in acute distress.    Appearance: He is well-developed. He is obese. He is not diaphoretic.  Neck:     Thyroid: No thyromegaly.  Cardiovascular:     Rate and Rhythm: Normal rate. Rhythm irregular.     Pulses: Normal pulses.     Heart sounds: Normal heart sounds.  Pulmonary:     Effort: Pulmonary effort is normal. No respiratory distress.     Breath sounds: Normal breath sounds.     Comments: Uses CPAP Abdominal:     General: Bowel sounds are normal. There is no distension.     Palpations: Abdomen is soft.     Tenderness: There is no abdominal tenderness.  Musculoskeletal:        General: Normal range of motion.     Cervical back: Neck supple.     Right lower leg: No edema.     Left lower leg: No edema.  Lymphadenopathy:     Cervical: No cervical adenopathy.  Skin:    General: Skin is warm and dry.  Neurological:     Mental Status: He is alert and oriented to person, place, and time.  Psychiatric:        Mood and Affect: Mood normal.        ASSESSMENT/ PLAN:  Patient is being discharged with the following home health services:  Pt/ot/rn/cna: to evaluate and treat as indicated for gait balance strength adl training adl care wound management.   Patient is being discharged with the following  durable medical equipment:  Standard wheelchair with elevated leg rests; cushion; anti-tippers brake extensions to allow him to maintain his current level of independence with his adls which cannot be achieved with a walker can self propel   Patient has been advised to f/u with their PCP in 1-2 weeks to bring them up to date on their rehab stay.  Social services at facility was responsible for arranging this appointment.  Pt was provided with a 30 day supply of prescriptions for medications and refills must be obtained from their PCP.  For controlled substances, a more limited supply may be provided adequate until PCP appointment only.   A 30 day supply of his prescription medications have been sent to [facility]  Time spent with patient 40 minutes: home health needs; dme; medications.   Synthia Innocent NP [facility]  Contact [phone] Monday through Friday 8am- 5pm  After hours call [phone]

## 2020-04-21 ENCOUNTER — Encounter: Payer: Self-pay | Admitting: Adult Health

## 2020-04-21 ENCOUNTER — Non-Acute Institutional Stay (SKILLED_NURSING_FACILITY): Payer: Medicare Other | Admitting: Adult Health

## 2020-04-21 DIAGNOSIS — N401 Enlarged prostate with lower urinary tract symptoms: Secondary | ICD-10-CM

## 2020-04-21 DIAGNOSIS — I872 Venous insufficiency (chronic) (peripheral): Secondary | ICD-10-CM | POA: Diagnosis not present

## 2020-04-21 DIAGNOSIS — I482 Chronic atrial fibrillation, unspecified: Secondary | ICD-10-CM

## 2020-04-21 DIAGNOSIS — E43 Unspecified severe protein-calorie malnutrition: Secondary | ICD-10-CM

## 2020-04-21 DIAGNOSIS — R3915 Urgency of urination: Secondary | ICD-10-CM

## 2020-04-21 DIAGNOSIS — I5033 Acute on chronic diastolic (congestive) heart failure: Secondary | ICD-10-CM | POA: Diagnosis not present

## 2020-04-21 NOTE — Progress Notes (Signed)
Location:    [facility] Room Number: 151/P Place of Service:  SNF (31)    CODE STATUS: Full Code  No Known Allergies  Chief Complaint  Patient presents with  . Discharge Note    Discharge Visit    HPI:  He is being discharged home with home health for pt/ot. Will need a standard wheelchair. He will need his prescriptions written and will need to follow up with his medical provider. He was admitted to the hospital for acute on chronic heart failure. He was admitted to this facility for short term rehab. He participate in pt/ot. He is now ready to complete therapy on a home health basis.    Past Medical History:  Diagnosis Date  . A-fib ([place])   . Cellulitis   . Hemorrhoid   . Hypertension   . Prostate disorder     Past Surgical History:  Procedure Laterality Date  . BIOPSY  03/17/2020   Procedure: BIOPSY;  Surgeon: Daneil Dolin, MD;  [facility]: [facility];  Service: Endoscopy;;  gastric rectal  . ESOPHAGEAL DILATION N/A 03/17/2020   Procedure: ESOPHAGEAL DILATION;  Surgeon: Daneil Dolin, MD;  [facility];  Service: Endoscopy;  Laterality: N/A;  . ESOPHAGOGASTRODUODENOSCOPY (EGD) WITH PROPOFOL N/A 03/17/2020   Procedure: ESOPHAGOGASTRODUODENOSCOPY (EGD) WITH PROPOFOL;  Surgeon: Daneil Dolin, MD;  [facility];  Service: Endoscopy;  Laterality: N/A;  . FLEXIBLE SIGMOIDOSCOPY N/A 03/17/2020   Procedure: FLEXIBLE SIGMOIDOSCOPY WITH PROPOFOL;  Surgeon: Daneil Dolin, MD;  [facility]: [facility];  Service: Endoscopy;  Laterality: N/A;  . HEMORRHOID SURGERY     patient reports several hemorrhoid surgeries in the past    Social History   Socioeconomic History  . Marital status: Married    Spouse name: Not on file  . Number of children: Not on file  . Years of education: Not on file  . Highest education level: Not on file  Occupational History  . Not on file  Tobacco Use  . Smoking status: Former Research scientist (life sciences)  .  Smokeless tobacco: Never Used  Substance and Sexual Activity  . Alcohol use: Yes    Comment: "very little"  . Drug use: Never  . Sexual activity: Not on file  Other Topics Concern  . Not on file  Social History Narrative   On his third marriage x 18 years. Has 3 children and several grandchildren. His son lives in [place], [place]  and is primary contact. 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 Lives with his wife.  Former smoker.       Social Determinants of Health   Financial Resource Strain:   . Difficulty of Paying Living Expenses:   Food Insecurity:   . Worried About Charity fundraiser in the Last Year:   . Arboriculturist in the Last Year:   Transportation Needs:   . Film/video editor (Medical):   Marland Kitchen Lack of Transportation (Non-Medical):   Physical Activity:   . Days of Exercise per Week:   . Minutes of Exercise per Session:   Stress:   . Feeling of Stress :   Social Connections:   . Frequency of Communication with Friends and Family:   . Frequency of Social Gatherings with Friends and Family:   . Attends Religious Services:   . Active Member of Clubs or Organizations:   . Attends Archivist Meetings:   Marland Kitchen Marital Status:  Intimate Partner Violence:   . Fear of Current or Ex-Partner:   . Emotionally Abused:   Marland Kitchen Physically Abused:   . Sexually Abused:    Family History  Problem Relation Age of Onset  . Colon cancer Neg Hx     VITAL SIGNS BP 140/68   Pulse 68   Temp 97.6 F (36.4 C) (Oral)   Resp 20   Ht 5\' 6"  (1.676 m)   Wt 194 lb 3.2 oz (88.1 kg)   SpO2 93%   BMI 31.34 kg/m   Patient's Medications  New Prescriptions   No medications on file  Previous Medications   ACETAMINOPHEN (TYLENOL) 325 MG TABLET    Take 2 tablets (650 mg total) by mouth every 6 (six) hours as needed for mild pain, fever or headache (or Fever >/= 101).   ALBUTEROL (VENTOLIN HFA) 108 (90 BASE) MCG/ACT INHALER    Inhale 2 puffs into the lungs every 6  (six) hours as needed for wheezing or shortness of breath.   AMINO ACIDS-PROTEIN HYDROLYS (FEEDING SUPPLEMENT, PRO-STAT SUGAR FREE 64,) LIQD    Take 30 mLs by mouth in the morning and at bedtime.   APIXABAN (ELIQUIS) 2.5 MG TABS TABLET    Take 1 tablet (2.5 mg total) by mouth 2 (two) times daily.   ASCORBIC ACID (VITAMIN C) 500 MG TABLET    Take 1,000 mg by mouth daily.   CHOLECALCIFEROL (VITAMIN D3) 125 MCG (5000 UT) CAPS    Take 1 capsule (5,000 Units total) by mouth daily.   FEEDING SUPPLEMENT, ENSURE ENLIVE, (ENSURE ENLIVE) LIQD    Take 237 mLs by mouth 2 (two) times daily between meals.   GABAPENTIN (NEURONTIN) 100 MG CAPSULE    Take 1 capsule (100 mg total) by mouth at bedtime.   LINACLOTIDE (LINZESS) 145 MCG CAPS CAPSULE    Take 1 capsule (145 mcg total) by mouth daily before breakfast.   METOPROLOL SUCCINATE (TOPROL-XL) 25 MG 24 HR TABLET    Take 0.5 tablets (12.5 mg total) by mouth daily.   NON FORMULARY    May use CPAP from home with home settings. At Bedtime   NON FORMULARY    Diet Change: Regular, continue thin liquids   OMEGA-3 FATTY ACIDS (FISH OIL BURP-LESS PO)    Take 2 capsules by mouth daily.   PANTOPRAZOLE (PROTONIX) 40 MG TABLET    Take 1 tablet (40 mg total) by mouth daily.   POLYETHYLENE GLYCOL (MIRALAX / GLYCOLAX) 17 G PACKET    Take 17 g by mouth 2 (two) times daily.   POTASSIUM CHLORIDE (KLOR-CON) 10 MEQ TABLET    Take 1 tablet (10 mEq total) by mouth daily.   ROSUVASTATIN (CRESTOR) 10 MG TABLET    Take 1 tablet (10 mg total) by mouth in the morning.   SENNA-DOCUSATE (SENOKOT-S) 8.6-50 MG TABLET    Take 2 tablets by mouth at bedtime.   SODIUM CHLORIDE (OCEAN) 0.65 % SOLN NASAL SPRAY    Place 2 sprays into both nostrils 3 (three) times daily.   TAMSULOSIN (FLOMAX) 0.4 MG CAPS CAPSULE    Take 1 capsule (0.4 mg total) by mouth daily after supper.   TORSEMIDE (DEMADEX) 20 MG TABLET    Take 1 tablet (20 mg total) by mouth daily.   VITAMIN E 400 UNITS TABS    Take 2 tablets by  mouth daily.   Modified Medications   No medications on file  Discontinued Medications   PROBIOTIC PRODUCT (RISA-BID PROBIOTIC PO)  Take 1 capsule by mouth daily.     SIGNIFICANT DIAGNOSTIC EXAMS  PREVIOUS   03-08-20: chest x-ray: Stable significant elevation of the right hemidiaphragm. No acute finding.  03-08-20: ct angio of chest:  No evidence of pulmonary emboli. Small right pleural effusion with right basilar atelectasis. 5 mm nodule in the left upper lobe increased in size from 16 years ago at which time it measured 1-2 mm. No follow-up needed if patient is low-risk. Non-contrast chest CT can be considered in 12 months if patient is high-risk.  03-09-20: 2-d echo:  Left ventricular ejection fraction, by estimation, is 60 to 65%. The  left ventricle has normal function. The left ventricle has no regional  wall motion abnormalities. There is mild concentric left ventricular  hypertrophy. Left ventricular diastolic  parameters were normal. There is the interventricular septum is flattened  in systole, consistent with right ventricular pressure overload.   03-09-20: left upper extremity doppler: No evidence of DVT within the left upper extremity.   03-13-20: chest x-ray:  Low lung volumes. Stable elevation of the right hemidiaphragm. Hazy right basilar lung opacity, which could represent atelectasis, aspiration and/or pneumonia. New blunting of the right costophrenic angle, cannot exclude small right pleural effusion.  03-14-20: ct of abdomen and pelvis:  An acute mild uncomplicated proctitis with small amount of free fluid in the presacral space and edema in the perirectal fat plane without perforation or abscess. Large stool burden throughout the colon. Sigmoid diverticulosis without acute diverticulitis or contrast extravasation within the colonic lumen. Apparent mild mucosal thickening of the gastric lumen and a left lower abdominal quadrant small bowel loop, which could be  secondary to incomplete distension or a mild gastroenteritis. Abnormal enhancement in the medial right hepatic dome and in the splenic parenchyma, possibly intrahepatic varices and splenic hemangioma, new or not apparent on the March 08, 2020 and April 18, 2004 CT chest studies. Mild cardiomegaly with four-vessel moderate to severe coronary calcifications, small bilateral pleural effusions and bibasilar compressive atelectasis. Extensive anasarca. Nonobstructing small left renal calculus. Aortic calcified atherosclerosis. Pulmonary artery hypertension.   03-17-20: EGD:- Normal esophagus. Dilated. -Gastric mucosal changes consistent with portal hypertensive gastropathy .  Gastric erosions. Status post gastric biopsy. - Normal duodenal bulb and second portion of the duodenum.    03-17-20: flexible sigmoidoscopy:  -Distal rectal ulceration with erosion involving the distal 6 to 7 cm of the rectum circumferentially -likely pressure induced phenomenon (constipation). Status post biopsy.  NO NEW EXAMS.    LABS REVIEWED PREVIOUS;   03-08-20: wbc 18.9 hgb 16.7; hct 51.1; mcv 98.1 plt 143; glucose 100; bun 29; creat 1.28; k= 3.7; na++ 140; ca 9.4; total bili 2.9; albumin 3.2; tsh 0.492; mag 2.5 03-09-20: blood cultures: no growth 03-10-20: wbc 17.0; hgb 16.4; hct 52.2; mcv 100.4 plt 108; glucose 112; bun 29; creat 1.25; k+ 3.7; an++ 140; ca 8.9  03-13-20: wbc 13.8; hgb 15.1; hct 49.4 mcv 103.1 plt 85; glucose 94; bun 21; creat 0.87; k+ 3.7; na++ 140; ca 8.1  03-14-20: wbc 19.4; hgb 16.7; hct 52.7; mcv 101.3 plt 87; glucose 114; bun 20; creat 0.74; k+ 3.9; na++ 140; ca 8.3; liver normal albumin 2.7 03-17-20: glucose 82; bun 20; creat 0.67; k+ 3.8; na++ 138; ca 8.2 liver normal albumin 2.3 03-18-20: wbc 10.4; hgb 15.9; hct 50.7 mcv 101.4; plt 84; glucose 105; bun 23; creat 0.70; k+ 4.3; na++ 140; ca 9.1 03-24-20: wbc 7.8; hgb 13.9; hct 43.6; mcv 101.4 plt 100; glucose 99; bun 22;  creat 0.80; k+ 3.7; na++ 140;  ca 8.5   NO NEW LABS    Review of Systems  Constitutional: Negative for malaise/fatigue.  Respiratory: Negative for cough and shortness of breath.   Cardiovascular: Negative for chest pain, palpitations and leg swelling.  Gastrointestinal: Negative for abdominal pain, constipation and heartburn.  Musculoskeletal: Negative for back pain, joint pain and myalgias.  Skin: Negative.   Neurological: Negative for dizziness.  Psychiatric/Behavioral: The patient is not nervous/anxious.     Physical Exam Constitutional:      General: He is not in acute distress.    Appearance: He is well-developed. He is obese. He is not diaphoretic.  Neck:     Thyroid: No thyromegaly.  Cardiovascular:     Rate and Rhythm: Normal rate. Rhythm irregular.     Pulses: Normal pulses.     Heart sounds: Normal heart sounds.  Pulmonary:     Effort: Pulmonary effort is normal. No respiratory distress.     Breath sounds: Normal breath sounds.     Comments: Uses  CPAP  Abdominal:     General: Bowel sounds are normal. There is no distension.     Palpations: Abdomen is soft.     Tenderness: There is no abdominal tenderness.  Musculoskeletal:        General: Normal range of motion.     Cervical back: Neck supple.     Right lower leg: No edema.     Left lower leg: No edema.  Lymphadenopathy:     Cervical: No cervical adenopathy.  Skin:    General: Skin is warm and dry.  Neurological:     Mental Status: He is alert and oriented to person, place, and time.  Psychiatric:        Mood and Affect: Mood normal.        ASSESSMENT/ PLAN:  Patient is being discharged with the following home health services:  Pt/ot to evaluate and treat as indicated for gait balance strength adl training  Patient is being discharged with the following durable medical equipment:  Standard wheelchair to allow him to maintain his current level of independence with his adls which cannot be achieved with a walker can self propel    Patient has been advised to f/u with their PCP in 1-2 weeks to bring them up to date on their rehab stay.  Social services at facility was responsible for arranging this appointment.  Pt was provided with a 30 day supply of prescriptions for medications and refills must be obtained from their PCP.  For controlled substances, a more limited supply may be provided adequate until PCP appointment only.   A 30 day supply of his prescription medications have been sent to [facility].   Time spent with patient 40 minutes: home health needs; dme medications.     Synthia Innocent NP [facility]  Contact [phone] Monday through Friday 8am- 5pm  After hours call [phone]

## 2020-04-22 ENCOUNTER — Other Ambulatory Visit: Payer: Self-pay | Admitting: Adult Health

## 2020-04-22 MED ORDER — APIXABAN 2.5 MG PO TABS: 2.5000 mg | ORAL_TABLET | Freq: Two times a day (BID) | ORAL | 0 refills | Status: DC

## 2020-04-22 MED ORDER — POTASSIUM CHLORIDE ER 10 MEQ PO TBCR: 10.0000 meq | EXTENDED_RELEASE_TABLET | Freq: Every day | ORAL | 0 refills | Status: DC

## 2020-04-22 MED ORDER — METOPROLOL SUCCINATE ER 25 MG PO TB24: 12.5000 mg | ORAL_TABLET | Freq: Every day | ORAL | 0 refills | Status: AC

## 2020-04-22 MED ORDER — LINACLOTIDE 145 MCG PO CAPS: 145.0000 ug | ORAL_CAPSULE | Freq: Every day | ORAL | 0 refills | Status: DC

## 2020-04-22 MED ORDER — TAMSULOSIN HCL 0.4 MG PO CAPS: 0.4000 mg | ORAL_CAPSULE | Freq: Every day | ORAL | 0 refills | Status: AC

## 2020-04-22 MED ORDER — ROSUVASTATIN CALCIUM 10 MG PO TABS: 10.0000 mg | ORAL_TABLET | Freq: Every morning | ORAL | 0 refills | Status: DC

## 2020-04-22 MED ORDER — ALBUTEROL SULFATE HFA 108 (90 BASE) MCG/ACT IN AERS: 2.0000 | INHALATION_SPRAY | Freq: Four times a day (QID) | RESPIRATORY_TRACT | 0 refills | Status: AC | PRN

## 2020-04-22 MED ORDER — TORSEMIDE 20 MG PO TABS: 20.0000 mg | ORAL_TABLET | Freq: Every day | ORAL | 0 refills | Status: DC

## 2020-04-22 MED ORDER — GABAPENTIN 100 MG PO CAPS: 100.0000 mg | ORAL_CAPSULE | Freq: Every evening | ORAL | 0 refills | Status: AC

## 2020-04-22 MED ORDER — PANTOPRAZOLE SODIUM 40 MG PO TBEC: 40.0000 mg | DELAYED_RELEASE_TABLET | Freq: Every day | ORAL | 0 refills | Status: AC

## 2020-05-24 ENCOUNTER — Encounter (INDEPENDENT_AMBULATORY_CARE_PROVIDER_SITE_OTHER): Payer: Medicare Other | Admitting: Ophthalmology

## 2020-05-27 ENCOUNTER — Emergency Department (HOSPITAL_COMMUNITY): Payer: Medicare Other

## 2020-05-27 ENCOUNTER — Inpatient Hospital Stay (HOSPITAL_COMMUNITY)
Admission: EM | Admit: 2020-05-27 | Discharge: 2020-06-01 | DRG: 551 | Disposition: A | Discharge status: Skilled Nursing Facility | Payer: Medicare Other | Attending: Internal Medicine | Admitting: Internal Medicine

## 2020-05-27 ENCOUNTER — Encounter (HOSPITAL_COMMUNITY): Payer: Self-pay | Admitting: Emergency Medicine

## 2020-05-27 ENCOUNTER — Other Ambulatory Visit: Payer: Self-pay

## 2020-05-27 DIAGNOSIS — M5116 Intervertebral disc disorders with radiculopathy, lumbar region: Secondary | ICD-10-CM | POA: Diagnosis present

## 2020-05-27 DIAGNOSIS — R3915 Urgency of urination: Secondary | ICD-10-CM | POA: Diagnosis present

## 2020-05-27 DIAGNOSIS — Z87891 Personal history of nicotine dependence: Secondary | ICD-10-CM | POA: Diagnosis not present

## 2020-05-27 DIAGNOSIS — Z7901 Long term (current) use of anticoagulants: Secondary | ICD-10-CM

## 2020-05-27 DIAGNOSIS — R29898 Other symptoms and signs involving the musculoskeletal system: Secondary | ICD-10-CM

## 2020-05-27 DIAGNOSIS — Z20822 Contact with and (suspected) exposure to covid-19: Secondary | ICD-10-CM | POA: Diagnosis present

## 2020-05-27 DIAGNOSIS — R531 Weakness: Secondary | ICD-10-CM

## 2020-05-27 DIAGNOSIS — T380X5A Adverse effect of glucocorticoids and synthetic analogues, initial encounter: Secondary | ICD-10-CM | POA: Diagnosis not present

## 2020-05-27 DIAGNOSIS — N401 Enlarged prostate with lower urinary tract symptoms: Secondary | ICD-10-CM | POA: Diagnosis present

## 2020-05-27 DIAGNOSIS — Z9989 Dependence on other enabling machines and devices: Secondary | ICD-10-CM

## 2020-05-27 DIAGNOSIS — R600 Localized edema: Secondary | ICD-10-CM

## 2020-05-27 DIAGNOSIS — R54 Age-related physical debility: Secondary | ICD-10-CM | POA: Diagnosis present

## 2020-05-27 DIAGNOSIS — R5381 Other malaise: Secondary | ICD-10-CM

## 2020-05-27 DIAGNOSIS — M48061 Spinal stenosis, lumbar region without neurogenic claudication: Secondary | ICD-10-CM | POA: Diagnosis present

## 2020-05-27 DIAGNOSIS — I872 Venous insufficiency (chronic) (peripheral): Secondary | ICD-10-CM | POA: Diagnosis present

## 2020-05-27 DIAGNOSIS — D72829 Elevated white blood cell count, unspecified: Secondary | ICD-10-CM | POA: Diagnosis not present

## 2020-05-27 DIAGNOSIS — G4733 Obstructive sleep apnea (adult) (pediatric): Secondary | ICD-10-CM

## 2020-05-27 DIAGNOSIS — M5416 Radiculopathy, lumbar region: Secondary | ICD-10-CM | POA: Diagnosis not present

## 2020-05-27 DIAGNOSIS — E785 Hyperlipidemia, unspecified: Secondary | ICD-10-CM | POA: Diagnosis present

## 2020-05-27 DIAGNOSIS — I110 Hypertensive heart disease with heart failure: Secondary | ICD-10-CM | POA: Diagnosis present

## 2020-05-27 DIAGNOSIS — M4726 Other spondylosis with radiculopathy, lumbar region: Secondary | ICD-10-CM | POA: Diagnosis present

## 2020-05-27 DIAGNOSIS — K219 Gastro-esophageal reflux disease without esophagitis: Secondary | ICD-10-CM | POA: Diagnosis present

## 2020-05-27 DIAGNOSIS — Z79899 Other long term (current) drug therapy: Secondary | ICD-10-CM

## 2020-05-27 DIAGNOSIS — I482 Chronic atrial fibrillation, unspecified: Secondary | ICD-10-CM | POA: Diagnosis present

## 2020-05-27 DIAGNOSIS — I5033 Acute on chronic diastolic (congestive) heart failure: Secondary | ICD-10-CM | POA: Diagnosis present

## 2020-05-27 LAB — URINALYSIS, ROUTINE W REFLEX MICROSCOPIC
Bilirubin Urine: NEGATIVE
Glucose, UA: NEGATIVE mg/dL
Hgb urine dipstick: NEGATIVE
Ketones, ur: NEGATIVE mg/dL
Leukocytes,Ua: NEGATIVE
Nitrite: NEGATIVE
Protein, ur: 30 mg/dL — AB
Specific Gravity, Urine: 1.021 (ref 1.005–1.030)
pH: 5 (ref 5.0–8.0)

## 2020-05-27 LAB — MAGNESIUM: Magnesium: 2 mg/dL (ref 1.7–2.4)

## 2020-05-27 LAB — CBC WITH DIFFERENTIAL/PLATELET
Abs Immature Granulocytes: 0.03 10*3/uL (ref 0.00–0.07)
Basophils Absolute: 0 10*3/uL (ref 0.0–0.1)
Basophils Relative: 0 %
Eosinophils Absolute: 0.1 10*3/uL (ref 0.0–0.5)
Eosinophils Relative: 1 %
HCT: 44.3 % (ref 39.0–52.0)
Hemoglobin: 14.1 g/dL (ref 13.0–17.0)
Immature Granulocytes: 0 %
Lymphocytes Relative: 15 %
Lymphs Abs: 1.6 10*3/uL (ref 0.7–4.0)
MCH: 32.5 pg (ref 26.0–34.0)
MCHC: 31.8 g/dL (ref 30.0–36.0)
MCV: 102.1 fL — ABNORMAL HIGH (ref 80.0–100.0)
Monocytes Absolute: 1 10*3/uL (ref 0.1–1.0)
Monocytes Relative: 10 %
Neutro Abs: 7.6 10*3/uL (ref 1.7–7.7)
Neutrophils Relative %: 74 %
Platelets: 199 10*3/uL (ref 150–400)
RBC: 4.34 MIL/uL (ref 4.22–5.81)
RDW: 14.8 % (ref 11.5–15.5)
WBC: 10.3 10*3/uL (ref 4.0–10.5)
nRBC: 0 % (ref 0.0–0.2)

## 2020-05-27 LAB — BRAIN NATRIURETIC PEPTIDE: B Natriuretic Peptide: 281 pg/mL — ABNORMAL HIGH (ref 0.0–100.0)

## 2020-05-27 LAB — COMPREHENSIVE METABOLIC PANEL
ALT: 17 U/L (ref 0–44)
AST: 30 U/L (ref 15–41)
Albumin: 3.2 g/dL — ABNORMAL LOW (ref 3.5–5.0)
Alkaline Phosphatase: 67 U/L (ref 38–126)
Anion gap: 12 (ref 5–15)
BUN: 20 mg/dL (ref 8–23)
CO2: 29 mmol/L (ref 22–32)
Calcium: 9.6 mg/dL (ref 8.9–10.3)
Chloride: 100 mmol/L (ref 98–111)
Creatinine, Ser: 0.89 mg/dL (ref 0.61–1.24)
GFR calc Af Amer: >60 mL/min (ref >60)
GFR calc non Af Amer: >60 mL/min (ref >60)
Glucose, Bld: 99 mg/dL (ref 70–99)
Potassium: 3.5 mmol/L (ref 3.5–5.1)
Sodium: 141 mmol/L (ref 135–145)
Total Bilirubin: 2.6 mg/dL — ABNORMAL HIGH (ref 0.3–1.2)
Total Protein: 7 g/dL (ref 6.5–8.1)

## 2020-05-27 LAB — TROPONIN I (HIGH SENSITIVITY): Troponin I (High Sensitivity): 12 ng/L (ref <18)

## 2020-05-27 LAB — SARS CORONAVIRUS 2 BY RT PCR (HOSPITAL ORDER, PERFORMED IN ~~LOC~~ HOSPITAL LAB): SARS Coronavirus 2: NEGATIVE

## 2020-05-27 LAB — TSH: TSH: 0.465 u[IU]/mL (ref 0.350–4.500)

## 2020-05-27 MED ORDER — DEXAMETHASONE SODIUM PHOSPHATE 4 MG/ML IJ SOLN
4.0000 mg | Freq: Four times a day (QID) | INTRAMUSCULAR | Status: DC
  Administered 2020-05-27 – 2020-05-28 (×2): 4 mg via INTRAVENOUS
  Filled 2020-05-27 (×5): qty 1

## 2020-05-27 MED ORDER — PANTOPRAZOLE SODIUM 40 MG PO TBEC
40.0000 mg | DELAYED_RELEASE_TABLET | Freq: Every day | ORAL | Status: DC
  Administered 2020-05-28 – 2020-06-01 (×5): 40 mg via ORAL
  Filled 2020-05-27 (×5): qty 1

## 2020-05-27 MED ORDER — ASPIRIN 81 MG PO CHEW: 324.0000 mg | CHEWABLE_TABLET | Freq: Once | ORAL | Status: DC

## 2020-05-27 NOTE — ED Notes (Signed)
Pt O2 sat mid 80's. Pt placed on 3L of O2 per [place]. O2 sats increased to 94%

## 2020-05-27 NOTE — ED Provider Notes (Signed)
[facility] EMERGENCY DEPARTMENT Provider Note   CSN: [id number] Arrival date & time: 05/27/20  1021     History Chief Complaint  Patient presents with  . Weakness    Jesus Fowler is a 86 y.o. male.  Patient with complicated medical history including CPAP for sleep apnea, obesity, chronic venous stasis lower extremities, heart failure, atrial fibrillation on Eliquis, radiculopathy, cellulitis, recent admission for GI bleed and recent cellulitis and leg edema presents with general weakness.  Patient was in rehab center after being discharged and has been home however he feels last 4 to 5 days he has become generally weak and has had persistent swelling in lower extremities.  Patient on home oxygen 2 L.  No fevers or chills reported.        Past Medical History:  Diagnosis Date  . A-fib (HCC)   . Cellulitis   . Hemorrhoid   . Hypertension   . Prostate disorder     Patient Active Problem List   Diagnosis Date Noted  . General weakness   . Physical deconditioning   . Right leg weakness 05/27/2020  . Lumbar radiculopathy 05/27/2020  . Radiculopathy of leg 04/06/2020  . Prolonged Q-T interval on ECG 03/26/2020  . Elevated bilirubin 03/26/2020  . Protein-calorie malnutrition, severe (HCC) 03/21/2020  . GERD without esophagitis 03/21/2020  . Cellulitis of left upper extremity 03/19/2020  . Dyslipidemia 03/19/2020  . Benign prostatic hyperplasia (BPH) with urinary urgency 03/19/2020  . Proctitis   . Rectal bleeding   . Dysphagia   . Early satiety   . Acute respiratory failure with hypoxia (HCC) 03/14/2020  . OSA on CPAP 03/14/2020  . Obesity (BMI 30-39.9) 03/14/2020  . Cellulitis 03/14/2020  . Venous stasis dermatitis of both lower extremities 03/14/2020  . Chronic constipation 03/14/2020  . Anticoagulated for Afib 03/14/2020  . Acute on chronic heart failure with preserved ejection fraction (HFpEF) /Diastolic Dysfunction 03/14/2020  . Hematochezia 03/14/2020  .  Swelling of lower extremity 03/08/2020  . Atrial fibrillation, chronic (HCC) 03/08/2020  . Swelling of both lower extremities 03/08/2020    Past Surgical History:  Procedure Laterality Date  . BIOPSY  03/17/2020   Procedure: BIOPSY;  Surgeon: Corbin Ade, MD;  Location: [facility];  Service: Endoscopy;;  gastric rectal  . ESOPHAGEAL DILATION N/A 03/17/2020   Procedure: ESOPHAGEAL DILATION;  Surgeon: Corbin Ade, MD;  Location: [facility];  Service: Endoscopy;  Laterality: N/A;  . ESOPHAGOGASTRODUODENOSCOPY (EGD) WITH PROPOFOL N/A 03/17/2020   Procedure: ESOPHAGOGASTRODUODENOSCOPY (EGD) WITH PROPOFOL;  Surgeon: Corbin Ade, MD;  Location: [facility];  Service: Endoscopy;  Laterality: N/A;  . FLEXIBLE SIGMOIDOSCOPY N/A 03/17/2020   Procedure: FLEXIBLE SIGMOIDOSCOPY WITH PROPOFOL;  Surgeon: Corbin Ade, MD;  Location: [facility];  Service: Endoscopy;  Laterality: N/A;  . HEMORRHOID SURGERY     patient reports several hemorrhoid surgeries in the past       Family History  Problem Relation Age of Onset  . Colon cancer Neg Hx     Social History   Tobacco Use  . Smoking status: Former Games developer  . Smokeless tobacco: Never Used  Substance Use Topics  . Alcohol use: Yes    Comment: "very little"  . Drug use: Never    Home Medications Prior to Admission medications   Medication Sig Start Date End Date Taking? Authorizing Provider  acetaminophen (TYLENOL) 325 MG tablet Take 2 tablets (650 mg total) by mouth every 6 (six) hours as needed  for mild pain, fever or headache (or Fever >/= 101). 03/14/20  Yes Emokpae, Courage, MD  albuterol (VENTOLIN HFA) 108 (90 Base) MCG/ACT inhaler Inhale 2 puffs into the lungs every 6 (six) hours as needed for wheezing or shortness of breath. 04/22/20  Yes Sharee Holster, NP  ascorbic acid (VITAMIN C) 500 MG tablet Take 1,000 mg by mouth daily.   Yes [provider]  Cholecalciferol (VITAMIN D3) 125 MCG (5000 UT) CAPS Take  1 capsule (5,000 Units total) by mouth daily. Patient taking differently: Take 3 capsules by mouth daily.  03/18/20  Yes Emokpae, Courage, MD  lisinopril-hydrochlorothiazide (ZESTORETIC) 20-12.5 MG tablet Take 1 tablet by mouth daily. 04/28/20  Yes [provider]  metoprolol succinate (TOPROL-XL) 25 MG 24 hr tablet Take 0.5 tablets (12.5 mg total) by mouth daily. 04/22/20  Yes Sharee Holster, NP  Omega-3 Fatty Acids (FISH OIL BURP-LESS PO) Take 2 capsules by mouth daily.   Yes [provider]  pantoprazole (PROTONIX) 40 MG tablet Take 1 tablet (40 mg total) by mouth daily. 04/22/20  Yes Sharee Holster, NP  polyethylene glycol (MIRALAX / GLYCOLAX) 17 g packet Take 17 g by mouth 2 (two) times daily. 03/18/20  Yes Emokpae, Courage, MD  potassium chloride (KLOR-CON) 10 MEQ tablet Take 1 tablet (10 mEq total) by mouth daily. 04/22/20  Yes Sharee Holster, NP  rosuvastatin (CRESTOR) 10 MG tablet Take 1 tablet (10 mg total) by mouth in the morning. 04/22/20  Yes Sharee Holster, NP  senna-docusate (SENOKOT-S) 8.6-50 MG tablet Take 2 tablets by mouth at bedtime. Patient taking differently: Take 2 tablets by mouth at bedtime as needed for mild constipation or moderate constipation.  03/14/20 03/14/21 Yes Emokpae, Courage, MD  sodium chloride (OCEAN) 0.65 % SOLN nasal spray Place 2 sprays into both nostrils 3 (three) times daily. Patient taking differently: Place 2 sprays into both nostrils 3 (three) times daily as needed for congestion.  03/14/20  Yes Emokpae, Courage, MD  tamsulosin (FLOMAX) 0.4 MG CAPS capsule Take 1 capsule (0.4 mg total) by mouth daily after supper. 04/22/20  Yes Sharee Holster, NP  apixaban (ELIQUIS) 5 MG TABS tablet Take 1 tablet (5 mg total) by mouth 2 (two) times daily. 05/31/20   Zannie Cove, MD  feeding supplement, ENSURE ENLIVE, (ENSURE ENLIVE) LIQD Take 237 mLs by mouth 2 (two) times daily between meals. Patient not taking: Reported on 05/27/2020 03/14/20    Shon Hale, MD  gabapentin (NEURONTIN) 100 MG capsule Take 1 capsule (100 mg total) by mouth at bedtime. Patient not taking: Reported on 05/27/2020 04/22/20   Sharee Holster, NP  HYDROcodone-acetaminophen (NORCO/VICODIN) 5-325 MG tablet Take 1 tablet by mouth every 6 (six) hours as needed for moderate pain. 05/31/20   Zannie Cove, MD  linaclotide [facility]) 145 MCG CAPS capsule Take 1 capsule (145 mcg total) by mouth daily before breakfast. Patient not taking: Reported on 05/27/2020 04/22/20   Sharee Holster, NP  NON FORMULARY May use CPAP from home with home settings. At Bedtime    [provider]  torsemide (DEMADEX) 20 MG tablet Take 1 tablet (20 mg total) by mouth daily. Patient not taking: Reported on 05/27/2020 04/22/20   Sharee Holster, NP    Allergies    Patient has no known allergies.  Review of Systems   Review of Systems  Constitutional: Positive for fatigue. Negative for chills and fever.  HENT: Negative for congestion.   Eyes: Negative for visual disturbance.  Respiratory: Positive for cough. Negative for shortness of breath.   Cardiovascular: Positive for leg swelling. Negative for chest pain.  Gastrointestinal: Negative for abdominal pain and vomiting.  Genitourinary: Negative for dysuria and flank pain.  Musculoskeletal: Negative for back pain, neck pain and neck stiffness.  Skin: Negative for rash.  Neurological: Positive for weakness. Negative for light-headedness and headaches.    Physical Exam Updated Vital Signs BP 111/89 (BP Location: Left Arm)   Pulse 100   Temp 98.1 F (36.7 C) (Oral)   Resp 18   Ht  (1.651 m)   Wt 89.2 kg   SpO2 96%   BMI 32.72 kg/m   Physical Exam Vitals and nursing note reviewed.  Constitutional:      Appearance: He is well-developed.  HENT:     Head: Normocephalic and atraumatic.  Eyes:     General:        Right eye: No discharge.        Left eye: No discharge.     Conjunctiva/sclera: Conjunctivae  normal.  Neck:     Trachea: No tracheal deviation.  Cardiovascular:     Rate and Rhythm: Normal rate and regular rhythm.  Pulmonary:     Effort: Pulmonary effort is normal.     Breath sounds: Normal breath sounds.  Abdominal:     General: There is no distension.     Palpations: Abdomen is soft.     Tenderness: There is no abdominal tenderness. There is no guarding.  Musculoskeletal:        General: Swelling and tenderness present. No signs of injury.     Cervical back: Normal range of motion and neck supple.  Skin:    General: Skin is warm.     Findings: No rash.  Neurological:     Mental Status: He is alert and oriented to person, place, and time.     Comments: Patient generally weak and deconditioned, patient can lift left leg however needs mild assistance to lift right leg, equal strength upper extremities.  Extraocular muscle function intact.     ED Results / Procedures / Treatments   Labs (all labs ordered are listed, but only abnormal results are displayed) Labs Reviewed  COMPREHENSIVE METABOLIC PANEL - Abnormal; Notable for the following components:      Result Value   Albumin 3.2 (*)    Total Bilirubin 2.6 (*)    All other components within normal limits  CBC WITH DIFFERENTIAL/PLATELET - Abnormal; Notable for the following components:   MCV 102.1 (*)    All other components within normal limits  BRAIN NATRIURETIC PEPTIDE - Abnormal; Notable for the following components:   B Natriuretic Peptide 281.0 (*)    All other components within normal limits  URINALYSIS, ROUTINE W REFLEX MICROSCOPIC - Abnormal; Notable for the following components:   Protein, ur 30 (*)    Bacteria, UA FEW (*)    All other components within normal limits  BASIC METABOLIC PANEL - Abnormal; Notable for the following components:   Glucose, Bld 117 (*)    All other components within normal limits  CBC - Abnormal; Notable for the following components:   MCV 101.1 (*)    All other components  within normal limits  CBC - Abnormal; Notable for the following components:   WBC 14.9 (*)    RBC 4.05 (*)    MCV 101.5 (*)    All other components within normal limits  BASIC METABOLIC PANEL - Abnormal; Notable for the  following components:   Glucose, Bld 152 (*)    BUN 32 (*)    All other components within normal limits  CBC - Abnormal; Notable for the following components:   WBC 13.2 (*)    RBC 3.97 (*)    MCV 102.0 (*)    All other components within normal limits  GLUCOSE, CAPILLARY - Abnormal; Notable for the following components:   Glucose-Capillary 140 (*)    All other components within normal limits  SARS CORONAVIRUS 2 BY RT PCR (HOSPITAL ORDER, PERFORMED IN [facility])  URINE CULTURE  SARS CORONAVIRUS 2 (TAT 6-24 HRS)  TSH  MAGNESIUM  GLUCOSE, CAPILLARY  TROPONIN I (HIGH SENSITIVITY)    EKG EKG Interpretation  Date/Time:  Friday May 27 2020 11:48:12 EDT Ventricular Rate:  75 PR Interval:    QRS Duration: 159 QT Interval:  455 QTC Calculation: 509 R Axis:   -70 Text Interpretation: Atrial fibrillation RBBB and LAFB Confirmed by Blane Ohara [phone]) on 05/27/2020 3:32:37 PM   Radiology No results found.  Procedures Procedures (including critical care time)  Medications Ordered in ED Medications  aspirin chewable tablet 324 mg (324 mg Oral Not Given 05/27/20 1910)  acetaminophen (TYLENOL) tablet 650 mg (650 mg Oral Given 05/31/20 2020)  HYDROcodone-acetaminophen (NORCO/VICODIN) 5-325 MG per tablet 1 tablet (1 tablet Oral Given 05/31/20 1356)  metoprolol succinate (TOPROL-XL) 24 hr tablet 12.5 mg (12.5 mg Oral Given 06/20/20 1123)  rosuvastatin (CRESTOR) tablet 10 mg (10 mg Oral Given 06-20-20 1123)  senna-docusate (Senokot-S) tablet 2 tablet (2 tablets Oral Given 05/28/20 1526)  polyethylene glycol (MIRALAX / GLYCOLAX) packet 17 g (17 g Oral Given 06-20-20 1126)  tamsulosin (FLOMAX) capsule 0.4 mg (0.4 mg Oral Given 05/31/20 1852)  ascorbic acid (VITAMIN  C) tablet 1,000 mg (1,000 mg Oral Given 06/20/2020 1123)  omega-3 acid ethyl esters (LOVAZA) capsule 1 g (1 g Oral Given 2020-06-20 1123)  albuterol (PROVENTIL) (2.5 MG/3ML) 0.083% nebulizer solution 3 mL (has no administration in time range)  sodium chloride (OCEAN) 0.65 % nasal spray 2 spray (has no administration in time range)  pantoprazole (PROTONIX) EC tablet 40 mg (40 mg Oral Given Jun 20, 2020 1123)  feeding supplement (BOOST / RESOURCE BREEZE) liquid 1 Container (1 Container Oral Given 05/31/20 2020)  apixaban (ELIQUIS) tablet 5 mg (5 mg Oral Given Jun 20, 2020 1124)  witch hazel-glycerin (TUCKS) pad (has no administration in time range)  dexamethasone (DECADRON) tablet 2 mg (2 mg Oral Given 05/30/20 0944)  dexamethasone (DECADRON) tablet 1 mg (1 mg Oral Given 05/31/20 9767)    ED Course  I have reviewed the triage vital signs and the nursing notes.  Pertinent labs & imaging results that were available during my care of the patient were reviewed by me and considered in my medical decision making (see chart for details).  Clinical Course as of Jun 01 1606  Fri May 27, 2020  1752 DG Chest Portable 1 View [JK]    Clinical Course User Index [JK] Linwood Dibbles, MD   MDM Rules/Calculators/A&P                      Patient presents after recent admission, rehab and discharge with worsening general weakness.  Patient is deconditioned clinically, discussed broad differential for weakness including metabolic, anemia, urine infection, pneumonia, stroke, other.  Plan for blood work, urinalysis pending and thyroid testing.  EKG showed a fib, reviewed.  Family arrived to provide further detail and to his further deconditioning/general weakness  over the past 4 days.  Blood work reviewed overall reassuring with normal hemoglobin, normal white blood cell count, urinalysis no sign of significant infection, BNP 280, chest x-ray no acute abnormality.  CT scan of the head is pending.  Signed out to fup results.   Final  Clinical Impression(s) / ED Diagnoses Final diagnoses:  General weakness  Right leg weakness  Bilateral leg edema  Physical deconditioning    Rx / DC Orders ED Discharge Orders         Ordered    HYDROcodone-acetaminophen (NORCO/VICODIN) 5-325 MG tablet  Every 6 hours PRN     05/31/20 1050    apixaban (ELIQUIS) 5 MG TABS tablet  2 times daily     05/31/20 1050    Increase activity slowly     05/31/20 1050    Diet - low sodium heart healthy     05/31/20 1050           Elnora Morrison, MD 06/21/2020 1608

## 2020-05-27 NOTE — ED Triage Notes (Signed)
Pt reports generalized weakness in bilateral lower extremity.

## 2020-05-27 NOTE — ED Notes (Signed)
Pt transported to MRI 

## 2020-05-27 NOTE — ED Notes (Signed)
Pt transported to CT ?

## 2020-05-27 NOTE — ED Provider Notes (Signed)
Pt seen by Dr Jodi Mourning.  Please see his note.  Pt with weakness, inability to walk.  Anticipate need for admission, possible MRI.  CT, trop pending.  TROP normal.   No acute head ct findings.  Pt remains weak.  ? Occult stroke  Does have radiculopathy history.  Sx may be related to that however as primarily having issues in his legs.  Family who have been assisting patient are leaving tomorrow and pt will be at home by himself.  Does not appear stable for discharge.  Will consult with medical service for admission and further evaluation.   Linwood Dibbles, MD 05/27/20 1759

## 2020-05-27 NOTE — H&P (Addendum)
TRH H&P   Patient Demographics:    Jesus Fowler, is a 86 y.o. male  MRN: [id number]   DOB - [birth year]  Admit Date - 05/27/2020  Outpatient Primary MD for the patient is [name], MD  Referring MD/NP/PA: Dr Lynelle Doctor   Patient coming from: Home  Chief Complaint  Patient presents with  . Weakness      HPI:    Jesus Fowler  is a 86 y.o. male, with past medical history of A. fib, on Eliquis, hypertension, prostate disorder, recurrent cellulitis with chronic lower extremity changes, active sleep apnea on BiPAP (not CPAP), patient was brought by his daughter secondary to progressive weakness, patient was hospitalized last March, he was discharged to SNF, he was discharged back home before 2 days, patient has been ambulating with a walker, he is having PT and OT following at home, patient reports progressive weakness in lower extremities bilaterally, more pronounced over the last 4 to 5 days, he denies any other focal deficits, tingling, numbness, slurred speech or altered mental status. -in ED CT head with no acute findings, but patient was noticed to have 3/5 weakness in right lower extremity, patient had no significant lab abnormality, treated hospitalist was consulted to admit.      Review of systems:    In addition to the HPI above,  No Fever-chills, No Headache, No changes with Vision or hearing, No problems swallowing food or Liquids, No Chest pain, Cough or Shortness of Breath, No Abdominal pain, No Nausea or Vommitting, Bowel movements are regular, No Blood in stool or Urine, No dysuria, No new skin rashes or bruises, No new joints pains-aches,  She reports some generalized weakness at baseline, but he does have worsening right lower extremity over the last 4 to 5 days. No recent weight gain or loss, No polyuria, polydypsia or polyphagia, No significant  Mental Stressors.  A full 10 point Review of Systems was done, except as stated above, all other Review of Systems were negative.   With Past History of the following :    Past Medical History:  Diagnosis Date  . A-fib (HCC)   . Cellulitis   . Hemorrhoid   . Hypertension   . Prostate disorder       Past Surgical History:  Procedure Laterality Date  . BIOPSY  03/17/2020   Procedure: BIOPSY;  Surgeon: Corbin Ade, MD;  Location: [facility];  Service: Endoscopy;;  gastric rectal  . ESOPHAGEAL DILATION N/A 03/17/2020   Procedure: ESOPHAGEAL DILATION;  Surgeon: Corbin Ade, MD;  Location: [facility];  Service: Endoscopy;  Laterality: N/A;  . ESOPHAGOGASTRODUODENOSCOPY (EGD) WITH PROPOFOL N/A 03/17/2020   Procedure: ESOPHAGOGASTRODUODENOSCOPY (EGD) WITH PROPOFOL;  Surgeon: Corbin Ade, MD;  Location: [facility];  Service: Endoscopy;  Laterality: N/A;  . FLEXIBLE SIGMOIDOSCOPY N/A 03/17/2020   Procedure: FLEXIBLE SIGMOIDOSCOPY WITH PROPOFOL;  Surgeon: Jena Gauss,  [name], MD;  Location: [facility];  Service: Endoscopy;  Laterality: N/A;  . HEMORRHOID SURGERY     patient reports several hemorrhoid surgeries in the past      Social History:     Social History   Tobacco Use  . Smoking status: Former Games developer  . Smokeless tobacco: Never Used  Substance Use Topics  . Alcohol use: Yes    Comment: "very little"     Lives -at home by himself  Mobility -walker     Family History :     Family History  Problem Relation Age of Onset  . Colon cancer Neg Hx      Home Medications:   Prior to Admission medications   Medication Sig Start Date End Date Taking? Authorizing Provider  acetaminophen (TYLENOL) 325 MG tablet Take 2 tablets (650 mg total) by mouth every 6 (six) hours as needed for mild pain, fever or headache (or Fever >/= 101). 03/14/20  Yes Emokpae, Courage, MD  albuterol (VENTOLIN HFA) 108 (90 Base) MCG/ACT inhaler Inhale 2 puffs into the lungs every  6 (six) hours as needed for wheezing or shortness of breath. 04/22/20  Yes Sharee Holster, NP  apixaban (ELIQUIS) 2.5 MG TABS tablet Take 1 tablet (2.5 mg total) by mouth 2 (two) times daily. 04/22/20  Yes Sharee Holster, NP  ascorbic acid (VITAMIN C) 500 MG tablet Take 1,000 mg by mouth daily.   Yes [provider]  Cholecalciferol (VITAMIN D3) 125 MCG (5000 UT) CAPS Take 1 capsule (5,000 Units total) by mouth daily. Patient taking differently: Take 3 capsules by mouth daily.  03/18/20  Yes Emokpae, Courage, MD  HYDROcodone-acetaminophen (NORCO/VICODIN) 5-325 MG tablet Take 1 tablet by mouth daily as needed for moderate pain.   Yes [provider]  lisinopril-hydrochlorothiazide (ZESTORETIC) 20-12.5 MG tablet Take 1 tablet by mouth daily. 04/28/20  Yes [provider]  metoprolol succinate (TOPROL-XL) 25 MG 24 hr tablet Take 0.5 tablets (12.5 mg total) by mouth daily. 04/22/20  Yes Sharee Holster, NP  Omega-3 Fatty Acids (FISH OIL BURP-LESS PO) Take 2 capsules by mouth daily.   Yes [provider]  pantoprazole (PROTONIX) 40 MG tablet Take 1 tablet (40 mg total) by mouth daily. 04/22/20  Yes Sharee Holster, NP  polyethylene glycol (MIRALAX / GLYCOLAX) 17 g packet Take 17 g by mouth 2 (two) times daily. 03/18/20  Yes Emokpae, Courage, MD  potassium chloride (KLOR-CON) 10 MEQ tablet Take 1 tablet (10 mEq total) by mouth daily. 04/22/20  Yes Sharee Holster, NP  rosuvastatin (CRESTOR) 10 MG tablet Take 1 tablet (10 mg total) by mouth in the morning. 04/22/20  Yes Sharee Holster, NP  senna-docusate (SENOKOT-S) 8.6-50 MG tablet Take 2 tablets by mouth at bedtime. Patient taking differently: Take 2 tablets by mouth at bedtime as needed for mild constipation or moderate constipation.  03/14/20 03/14/21 Yes Emokpae, Courage, MD  sodium chloride (OCEAN) 0.65 % SOLN nasal spray Place 2 sprays into both nostrils 3 (three) times daily. Patient taking differently: Place 2  sprays into both nostrils 3 (three) times daily as needed for congestion.  03/14/20  Yes Emokpae, Courage, MD  tamsulosin (FLOMAX) 0.4 MG CAPS capsule Take 1 capsule (0.4 mg total) by mouth daily after supper. 04/22/20  Yes Sharee Holster, NP  feeding supplement, ENSURE ENLIVE, (ENSURE ENLIVE) LIQD Take 237 mLs by mouth 2 (two) times daily between meals. Patient not taking: Reported on 05/27/2020 03/14/20  Shon Hale, MD  gabapentin (NEURONTIN) 100 MG capsule Take 1 capsule (100 mg total) by mouth at bedtime. Patient not taking: Reported on 05/27/2020 04/22/20   Sharee Holster, NP  linaclotide [facility]) 145 MCG CAPS capsule Take 1 capsule (145 mcg total) by mouth daily before breakfast. Patient not taking: Reported on 05/27/2020 04/22/20   Sharee Holster, NP  NON FORMULARY May use CPAP from home with home settings. At Bedtime    [provider]  torsemide (DEMADEX) 20 MG tablet Take 1 tablet (20 mg total) by mouth daily. Patient not taking: Reported on 05/27/2020 04/22/20   Sharee Holster, NP     Allergies:    No Known Allergies   Physical Exam:   Vitals  Blood pressure 113/73, pulse 88, temperature 97.9 F (36.6 C), temperature source Oral, resp. rate 19, height 5\' 5"  (1.651 m), weight 88 kg, SpO2 96 %.   1. General frail elderly male, laying in bed in mild discomfort secondary to lower back pain.  2. Normal affect and insight, Not Suicidal or Homicidal, Awake Alert, Oriented X 3.  3. No F.N deficits, ALL C.Nerves Intact, strength is 3-4/5 in right lower extremity, otherwise is 5 out of 5 and rest of extremities.  4. Ears and Eyes appear Normal, Conjunctivae clear, PERRLA. Moist Oral Mucosa.  5. Supple Neck, No JVD, No cervical lymphadenopathy appriciated, No Carotid Bruits.  6. Symmetrical Chest wall movement, Good air movement bilaterally, CTAB.  7. RRR, No Gallops, Rubs or Murmurs, No Parasternal Heave.  8. Positive Bowel Sounds, Abdomen Soft, No tenderness,  No organomegaly appriciated,No rebound -guarding or rigidity.  9.  No Cyanosis, Normal Skin Turgor, No Skin Rash or Bruise.  10. Good muscle tone,  joints appear normal , with chronic bilateral lower extremity edema, skin changes, and left lower extremity wrapping.  11. No Palpable Lymph Nodes in Neck or Axillae     Data Review:    CBC Recent Labs  Lab 05/27/20 1135  WBC 10.3  HGB 14.1  HCT 44.3  PLT 199  MCV 102.1*  MCH 32.5  MCHC 31.8  RDW 14.8  LYMPHSABS 1.6  MONOABS 1.0  EOSABS 0.1  BASOSABS 0.0   ------------------------------------------------------------------------------------------------------------------  Chemistries  Recent Labs  Lab 05/27/20 1135  NA 141  K 3.5  CL 100  CO2 29  GLUCOSE 99  BUN 20  CREATININE 0.89  CALCIUM 9.6  MG 2.0  AST 30  ALT 17  ALKPHOS 67  BILITOT 2.6*   ------------------------------------------------------------------------------------------------------------------ estimated creatinine clearance is 63 mL/min (by C-G formula based on SCr of 0.89 mg/dL). ------------------------------------------------------------------------------------------------------------------ Recent Labs    05/27/20 1135  TSH 0.465    Coagulation profile No results for input(s): INR, PROTIME in the last 168 hours. ------------------------------------------------------------------------------------------------------------------- No results for input(s): DDIMER in the last 72 hours. -------------------------------------------------------------------------------------------------------------------  Cardiac Enzymes No results for input(s): CKMB, TROPONINI, MYOGLOBIN in the last 168 hours.  Invalid input(s): CK ------------------------------------------------------------------------------------------------------------------    Component Value Date/Time   BNP 281.0 (H) 05/27/2020 1135      ---------------------------------------------------------------------------------------------------------------  Urinalysis    Component Value Date/Time   COLORURINE YELLOW 05/27/2020 1122   APPEARANCEUR CLEAR 05/27/2020 1122   LABSPEC 1.021 05/27/2020 1122   PHURINE 5.0 05/27/2020 1122   GLUCOSEU NEGATIVE 05/27/2020 1122   HGBUR NEGATIVE 05/27/2020 1122   BILIRUBINUR NEGATIVE 05/27/2020 1122   KETONESUR NEGATIVE 05/27/2020 1122   PROTEINUR 30 (A) 05/27/2020 1122   NITRITE NEGATIVE 05/27/2020 1122   LEUKOCYTESUR NEGATIVE 05/27/2020 1122    ----------------------------------------------------------------------------------------------------------------  Imaging Results:    CT Head Wo Contrast  Result Date: 05/27/2020 CLINICAL DATA:  Right leg weakness EXAM: CT HEAD WITHOUT CONTRAST TECHNIQUE: Contiguous axial images were obtained from the base of the skull through the vertex without intravenous contrast. COMPARISON:  None. FINDINGS: Brain: Mild atrophy. No hydrocephalus. Negative for acute infarct, hemorrhage, mass Vascular: Negative for hyperdense vessel Skull: Negative Sinuses/Orbits: Paranasal sinuses clear. Bilateral cataract extraction. Other: None IMPRESSION: Mild atrophy.  No acute abnormality Electronically Signed   By: Franchot Gallo M.D.   On: 05/27/2020 17:12   DG Chest Portable 1 View  Result Date: 05/27/2020 CLINICAL DATA:  Cough. Ex-smoker. EXAM: PORTABLE CHEST 1 VIEW COMPARISON:  03/13/2020 FINDINGS: Stable enlarged cardiac silhouette and elevated right hemidiaphragm. The pulmonary vasculature is mildly prominent with improvement. Mild diffuse peribronchial thickening and accentuation of the interstitial markings without significant change. Stable moderate scoliosis and diffuse osteopenia. IMPRESSION: 1. No acute abnormality. 2. Stable cardiomegaly and mild chronic bronchitic changes. 3. Stable elevated right hemidiaphragm. Electronically Signed   By: Claudie Revering  M.D.   On: 05/27/2020 15:07    My personal review of EKG: Rhythm A fib , Rate  75 /min, bundle branch block, left anterior fascicular block   Assessment & Plan:    Active Problems:   Atrial fibrillation, chronic (HCC)   OSA on CPAP   Venous stasis dermatitis of both lower extremities   Anticoagulated for Afib   Dyslipidemia   Benign prostatic hyperplasia (BPH) with urinary urgency   Right leg weakness   Lower extremity weakness -Overall patient with generalized weakness, but it is more pronounced on right lower extremity, can be attributed to radiculopathy given lower extremity pain, but as well acute CVA need to be ruled out, so we will proceed with obtaining MRI brain without contrast, will obtain lumbar MRI as well will consult PT/OT, continue with as needed pain management,  Chronic atrial fibrillation -Continue with Eliquis for anticoagulation, and continue with beta-blockers for heart rate control  Hypertension -Resume home medication include lisinopril/hydrochlorothiazide and metoprolol  OSA -Continue with BiPAP at bedtime(not CPAP)  Generalized weakness and deconditioning -We will consult PT/OT  Dyslipidemia -Continue with statin  GERD -Continue with PPI  Chronic venous stasis dermatitis of both lower extremities -Continue with skin care   Addendum 8:45 PM: -Patient have multilevel foraminal stenosis on lumbar MRI, discussed with neurosurgery Dr. Kathyrn Sheriff, he has significant radiculopathy/lumbar stenosis at L5/S1 level, he recommends patient to be transferred to [facility] for neurosurgery evaluation possible intervention if needed (either either surgery or injection), so request has been made transfer to [facility], patient will be started on IV Decadron 1 mg every 6 hours for his radiculopathy. As well I will go ahead and hold his Eliquis in case he needs intervention.  DVT Prophylaxis : on Eliquis  AM Labs Ordered, also please review Full  Orders  Family Communication: Admission, patients condition and plan of care including tests being ordered have been discussed with the patient and daughter at bedsdie who indicate understanding and agree with the plan and Code Status.  Code Status Full  Likely DC to  (home vs SNF)  Condition GUARDED    Consults called:  None  Admission status:  Observation  Time spent in minutes : 50 minutes   Phillips Climes M.D on 05/27/2020 at 6:34 PM   [facility] - Office  [phone]

## 2020-05-28 DIAGNOSIS — R29898 Other symptoms and signs involving the musculoskeletal system: Secondary | ICD-10-CM

## 2020-05-28 DIAGNOSIS — R531 Weakness: Secondary | ICD-10-CM

## 2020-05-28 DIAGNOSIS — Z7901 Long term (current) use of anticoagulants: Secondary | ICD-10-CM

## 2020-05-28 LAB — CBC
HCT: 45.7 % (ref 39.0–52.0)
Hemoglobin: 14.7 g/dL (ref 13.0–17.0)
MCH: 32.5 pg (ref 26.0–34.0)
MCHC: 32.2 g/dL (ref 30.0–36.0)
MCV: 101.1 fL — ABNORMAL HIGH (ref 80.0–100.0)
Platelets: 203 10*3/uL (ref 150–400)
RBC: 4.52 MIL/uL (ref 4.22–5.81)
RDW: 14.6 % (ref 11.5–15.5)
WBC: 7.4 10*3/uL (ref 4.0–10.5)
nRBC: 0 % (ref 0.0–0.2)

## 2020-05-28 LAB — BASIC METABOLIC PANEL
Anion gap: 12 (ref 5–15)
BUN: 16 mg/dL (ref 8–23)
CO2: 27 mmol/L (ref 22–32)
Calcium: 9.5 mg/dL (ref 8.9–10.3)
Chloride: 104 mmol/L (ref 98–111)
Creatinine, Ser: 0.94 mg/dL (ref 0.61–1.24)
GFR calc Af Amer: >60 mL/min (ref >60)
GFR calc non Af Amer: >60 mL/min (ref >60)
Glucose, Bld: 117 mg/dL — ABNORMAL HIGH (ref 70–99)
Potassium: 4.5 mmol/L (ref 3.5–5.1)
Sodium: 143 mmol/L (ref 135–145)

## 2020-05-28 MED ORDER — PANTOPRAZOLE SODIUM 40 MG PO TBEC: 40.0000 mg | DELAYED_RELEASE_TABLET | Freq: Every day | ORAL | Status: DC

## 2020-05-28 MED ORDER — DEXAMETHASONE SODIUM PHOSPHATE 4 MG/ML IJ SOLN
4.0000 mg | Freq: Two times a day (BID) | INTRAMUSCULAR | Status: DC
  Administered 2020-05-28: 4 mg via INTRAVENOUS
  Filled 2020-05-28 (×2): qty 1

## 2020-05-28 MED ORDER — APIXABAN 5 MG PO TABS
5.0000 mg | ORAL_TABLET | Freq: Two times a day (BID) | ORAL | Status: DC
  Administered 2020-05-28 – 2020-06-01 (×8): 5 mg via ORAL
  Filled 2020-05-28 (×8): qty 1

## 2020-05-28 MED ORDER — HYDROCHLOROTHIAZIDE 12.5 MG PO CAPS
12.5000 mg | ORAL_CAPSULE | Freq: Every day | ORAL | Status: DC
  Filled 2020-05-28: qty 1

## 2020-05-28 MED ORDER — ASCORBIC ACID 500 MG PO TABS
1000.0000 mg | ORAL_TABLET | Freq: Every day | ORAL | Status: DC
  Administered 2020-05-28 – 2020-06-01 (×5): 1000 mg via ORAL
  Filled 2020-05-28 (×5): qty 2

## 2020-05-28 MED ORDER — ACETAMINOPHEN 325 MG PO TABS
650.0000 mg | ORAL_TABLET | Freq: Four times a day (QID) | ORAL | Status: DC | PRN
  Administered 2020-05-31: 650 mg via ORAL
  Filled 2020-05-28: qty 2

## 2020-05-28 MED ORDER — TAMSULOSIN HCL 0.4 MG PO CAPS
0.4000 mg | ORAL_CAPSULE | Freq: Every day | ORAL | Status: DC
  Administered 2020-05-28 – 2020-05-31 (×4): 0.4 mg via ORAL
  Filled 2020-05-28 (×4): qty 1

## 2020-05-28 MED ORDER — LISINOPRIL-HYDROCHLOROTHIAZIDE 20-12.5 MG PO TABS: 1.0000 | ORAL_TABLET | Freq: Every day | ORAL | Status: DC

## 2020-05-28 MED ORDER — SALINE SPRAY 0.65 % NA SOLN: 2.0000 | Freq: Three times a day (TID) | NASAL | Status: DC | PRN

## 2020-05-28 MED ORDER — OMEGA-3-ACID ETHYL ESTERS 1 G PO CAPS
1.0000 g | ORAL_CAPSULE | Freq: Two times a day (BID) | ORAL | Status: DC
  Administered 2020-05-28 – 2020-06-01 (×9): 1 g via ORAL
  Filled 2020-05-28 (×9): qty 1

## 2020-05-28 MED ORDER — BOOST / RESOURCE BREEZE PO LIQD CUSTOM
1.0000 | Freq: Three times a day (TID) | ORAL | Status: DC
  Administered 2020-05-28 – 2020-05-31 (×8): 1 via ORAL

## 2020-05-28 MED ORDER — METOPROLOL SUCCINATE ER 25 MG PO TB24
12.5000 mg | ORAL_TABLET | Freq: Every day | ORAL | Status: DC
  Administered 2020-05-28 – 2020-06-01 (×5): 12.5 mg via ORAL
  Filled 2020-05-28 (×5): qty 1

## 2020-05-28 MED ORDER — HYDROCODONE-ACETAMINOPHEN 5-325 MG PO TABS
1.0000 | ORAL_TABLET | Freq: Every day | ORAL | Status: DC | PRN
  Administered 2020-05-31: 1 via ORAL
  Filled 2020-05-28: qty 1

## 2020-05-28 MED ORDER — APIXABAN 2.5 MG PO TABS
2.5000 mg | ORAL_TABLET | Freq: Two times a day (BID) | ORAL | Status: DC
  Administered 2020-05-28: 2.5 mg via ORAL
  Filled 2020-05-28: qty 1

## 2020-05-28 MED ORDER — LISINOPRIL 20 MG PO TABS
20.0000 mg | ORAL_TABLET | Freq: Every day | ORAL | Status: DC
  Administered 2020-05-28: 20 mg via ORAL
  Filled 2020-05-28: qty 1

## 2020-05-28 MED ORDER — ROSUVASTATIN CALCIUM 5 MG PO TABS
10.0000 mg | ORAL_TABLET | Freq: Every day | ORAL | Status: DC
  Administered 2020-05-28 – 2020-06-01 (×5): 10 mg via ORAL
  Filled 2020-05-28 (×5): qty 2

## 2020-05-28 MED ORDER — ALBUTEROL SULFATE (2.5 MG/3ML) 0.083% IN NEBU: 3.0000 mL | INHALATION_SOLUTION | Freq: Four times a day (QID) | RESPIRATORY_TRACT | Status: DC | PRN

## 2020-05-28 MED ORDER — SENNOSIDES-DOCUSATE SODIUM 8.6-50 MG PO TABS
2.0000 | ORAL_TABLET | Freq: Every evening | ORAL | Status: DC | PRN
  Administered 2020-05-28: 2 via ORAL
  Filled 2020-05-28: qty 2

## 2020-05-28 MED ORDER — POLYETHYLENE GLYCOL 3350 17 G PO PACK
17.0000 g | PACK | Freq: Two times a day (BID) | ORAL | Status: DC
  Administered 2020-05-28 – 2020-06-01 (×7): 17 g via ORAL
  Filled 2020-05-28 (×7): qty 1

## 2020-05-28 NOTE — Progress Notes (Signed)
PROGRESS NOTE    Jesus Fowler  JFH:[id number] DOB: 15-Oct-1933 DOA: 05/27/2020 PCP: [name], MD  Brief Narrative:Jesus Fowler  is a 86 y.o. male, with past medical history of A. fib, on Eliquis, hypertension, prostate disorder, recurrent cellulitis with chronic lower extremity changes, active sleep apnea on BiPAP (not CPAP), patient was brought by his daughter secondary to progressive weakness, patient was hospitalized last March, he was discharged to SNF, he was discharged back home before 2 days, patient has been ambulating with a walker, he is having PT and OT following at home, patient reports progressive weakness in lower extremities bilaterally, more pronounced over the last 4 to 5 days, he denies any other focal deficits, tingling, numbness, slurred speech or altered mental status. -in ED CT head with no acute findings, but patient was noticed to have 3/5 weakness in right lower extremity, patient had no significant lab abnormality, treated hospitalist was consulted to admit.  Assessment & Plan:   Bilateral lower leg weakness Pain and radiculopathy -MRI LS spine notes significant lumbar spinal stenosis and radiculopathy at L5/S1 -Started on IV Decadron last night, continue this today -Neurosurgery Dr. Conchita Paris consulted, recommend pain control, physical therapy -PT OT eval today  Chronic atrial fibrillation  -Continue metoprolol, heart rate controlled, resume Eliquis  Hypertension,  -blood pressure little softer this morning -Hold lisinopril and HCTZ today  OSA -Continue with BiPAP at bedtime(not CPAP)  Dyslipidemia -Continue with statin  GERD -Continue with PPI  Chronic venous stasis dermatitis of both lower extremities -Continue with skin care, order boots as needed  DVT Prophylaxis : on Eliquis  Code Status: Full code Family Communication: Discussed with patient in detail, no family at bedside Disposition Plan:  Status is: Inpatient   Remains inpatient appropriate because:Ongoing active pain requiring inpatient pain management, IV steroids, pain control and gait disorder   Dispo: The patient is from: Home              Anticipated d/c is to: SNF              Anticipated d/c date is: 2 days              Patient currently is not medically stable to d/c.  Consultants:   Neurosurgery   Procedures:   Antimicrobials:    Subjective: -Remain reported issues are lower leg pain and weakness bilaterally  Objective: Vitals:   05/28/20 0001 05/28/20 0550 05/28/20 0804 05/28/20 1100  BP:      Pulse: 92 85    Resp: 18 16 18    Temp:  98.6 F (37 C)  97.9 F (36.6 C)  TempSrc:  Oral  Oral  SpO2: 99%     Weight:  86.7 kg    Height:        Intake/Output Summary (Last 24 hours) at 05/28/2020 1121 Last data filed at 05/28/2020 0840 Gross per 24 hour  Intake 240 ml  Output -  Net 240 ml   Filed Weights   05/27/20 1040 05/27/20 2319 05/28/20 0550  Weight: 88 kg 86.1 kg 86.7 kg    Examination:  General exam: Elderly chronically ill male laying in bed, AAOx3, no distress Respiratory system: Clear Cardiovascular system: S1 & S2 heard, RRR. Gastrointestinal system: Abdomen is nondistended, soft and nontender.Normal bowel sounds heard. Central nervous system: Bilateral leg weakness, right> left. Extremities: No edema, Unna boots on both legs Skin: Both lower legs wrapped in Unna boots Psychiatry: Mood & affect appropriate.     Data Reviewed:  CBC: Recent Labs  Lab 05/27/20 1135 05/28/20 0910  WBC 10.3 7.4  NEUTROABS 7.6  --   HGB 14.1 14.7  HCT 44.3 45.7  MCV 102.1* 101.1*  PLT 199 203   Basic Metabolic Panel: Recent Labs  Lab 05/27/20 1135 05/28/20 0910  NA 141 143  K 3.5 4.5  CL 100 104  CO2 29 27  GLUCOSE 99 117*  BUN 20 16  CREATININE 0.89 0.94  CALCIUM 9.6 9.5  MG 2.0  --    GFR: Estimated Creatinine Clearance: 59.2 mL/min (by C-G formula based on SCr of 0.94 mg/dL). Liver  Function Tests: Recent Labs  Lab 05/27/20 1135  AST 30  ALT 17  ALKPHOS 67  BILITOT 2.6*  PROT 7.0  ALBUMIN 3.2*   No results for input(s): LIPASE, AMYLASE in the last 168 hours. No results for input(s): AMMONIA in the last 168 hours. Coagulation Profile: No results for input(s): INR, PROTIME in the last 168 hours. Cardiac Enzymes: No results for input(s): CKTOTAL, CKMB, CKMBINDEX, TROPONINI in the last 168 hours. BNP (last 3 results) No results for input(s): PROBNP in the last 8760 hours. HbA1C: No results for input(s): HGBA1C in the last 72 hours. CBG: No results for input(s): GLUCAP in the last 168 hours. Lipid Profile: No results for input(s): CHOL, HDL, LDLCALC, TRIG, CHOLHDL, LDLDIRECT in the last 72 hours. Thyroid Function Tests: Recent Labs    05/27/20 1135  TSH 0.465   Anemia Panel: No results for input(s): VITAMINB12, FOLATE, FERRITIN, TIBC, IRON, RETICCTPCT in the last 72 hours. Urine analysis:    Component Value Date/Time   COLORURINE YELLOW 05/27/2020 1122   APPEARANCEUR CLEAR 05/27/2020 1122   LABSPEC 1.021 05/27/2020 1122   PHURINE 5.0 05/27/2020 1122   GLUCOSEU NEGATIVE 05/27/2020 1122   HGBUR NEGATIVE 05/27/2020 1122   BILIRUBINUR NEGATIVE 05/27/2020 1122   KETONESUR NEGATIVE 05/27/2020 1122   PROTEINUR 30 (A) 05/27/2020 1122   NITRITE NEGATIVE 05/27/2020 1122   LEUKOCYTESUR NEGATIVE 05/27/2020 1122   Sepsis Labs: @LABRCNTIP (procalcitonin:4,lacticidven:4)  ) Recent Results (from the past 240 hour(s))  SARS Coronavirus 2 by RT PCR (hospital order, performed in [facility] hospital lab) Nasopharyngeal Nasopharyngeal Swab     Status: None   Collection Time: 05/27/20  5:54 PM   Specimen: Nasopharyngeal Swab  Result Value Ref Range Status   SARS Coronavirus 2 NEGATIVE NEGATIVE Final    Comment: (NOTE) SARS-CoV-2 target nucleic acids are NOT DETECTED. The SARS-CoV-2 RNA is generally detectable in upper and lower respiratory specimens during the  acute phase of infection. The lowest concentration of SARS-CoV-2 viral copies this assay can detect is 250 copies / mL. A negative result does not preclude SARS-CoV-2 infection and should not be used as the sole basis for treatment or other patient management decisions.  A negative result may occur with improper specimen collection / handling, submission of specimen other than nasopharyngeal swab, presence of viral mutation(s) within the areas targeted by this assay, and inadequate number of viral copies (<250 copies / mL). A negative result must be combined with clinical observations, patient history, and epidemiological information. Fact Sheet for Patients:   05/29/20 Fact Sheet for Healthcare Providers: BoilerBrush.com.cy This test is not yet approved or cleared  by the https://pope.com/ FDA and has been authorized for detection and/or diagnosis of SARS-CoV-2 by FDA under an Emergency Use Authorization (EUA).  This EUA will remain in effect (meaning this test can be used) for the duration of the COVID-19 declaration under Section 564(b)(1)  of the Act, 21 U.S.C. section 360bbb-3(b)(1), unless the authorization is terminated or revoked sooner. Performed at [facility], [address], [place], [place] [address]          Radiology Studies: CT Head Wo Contrast  Result Date: 05/27/2020 CLINICAL DATA:  Right leg weakness EXAM: CT HEAD WITHOUT CONTRAST TECHNIQUE: Contiguous axial images were obtained from the base of the skull through the vertex without intravenous contrast. COMPARISON:  None. FINDINGS: Brain: Mild atrophy. No hydrocephalus. Negative for acute infarct, hemorrhage, mass Vascular: Negative for hyperdense vessel Skull: Negative Sinuses/Orbits: Paranasal sinuses clear. Bilateral cataract extraction. Other: None IMPRESSION: Mild atrophy.  No acute abnormality Electronically Signed   By: Marlan Palau M.D.   On: 05/27/2020  17:12   MR BRAIN WO CONTRAST  Result Date: 05/27/2020 CLINICAL DATA:  Right leg weakness. EXAM: MRI HEAD WITHOUT CONTRAST TECHNIQUE: Multiplanar, multiecho pulse sequences of the brain and surrounding structures were obtained without intravenous contrast. COMPARISON:  CT head today FINDINGS: Brain: Motion degraded study. Negative for acute infarct. Generalized atrophy. Mild chronic microvascular ischemic change in the white matter. Negative for hemorrhage mass or fluid collection. Vascular: Normal arterial flow voids. Skull and upper cervical spine: Negative Sinuses/Orbits: Paranasal sinuses clear. Bilateral cataract extraction Other: None IMPRESSION: No acute abnormality Motion degraded study Atrophy with mild chronic microvascular ischemic change in the white matter. Electronically Signed   By: Marlan Palau M.D.   On: 05/27/2020 19:50   MR LUMBAR SPINE WO CONTRAST  Result Date: 05/27/2020 CLINICAL DATA:  Low back pain right leg weakness EXAM: MRI LUMBAR SPINE WITHOUT CONTRAST TECHNIQUE: Multiplanar, multisequence MR imaging of the lumbar spine was performed. No intravenous contrast was administered. COMPARISON:  Lumbar radiographs 06/30/2013 FINDINGS: Segmentation:  Normal Alignment:  Moderate levoscoliosis in the lumbar spine 7 mm anterolisthesis L5-S1 similar to the prior study. Vertebrae: Negative for fracture or mass. Discogenic edema on the right at L1-2 and L2-3 related to scoliosis. Conus medullaris and cauda equina: Conus extends to the L1-2 level. Conus and cauda equina appear normal. Paraspinal and other soft tissues: No paraspinous mass or adenopathy. 2 cm left renal cyst. Disc levels: T12-L1 mild disc degeneration L1-2: Asymmetric disc degeneration and spurring on the right. Mild spinal stenosis. Moderate subarticular and foraminal stenosis on the right. L2-3: Asymmetric disc degeneration and spurring on the right with moderate subarticular and foraminal stenosis right greater than left.  Moderate spinal stenosis L3-4: Disc degeneration with disc bulging and endplate spurring. Mild subarticular stenosis bilaterally. Mild facet degeneration L4-5: Disc degeneration asymmetric on the left with spurring. Bilateral facet degeneration. Moderate subarticular stenosis on the left. L5-S1: 7 mm anterolisthesis. Possible pars defect on the left. No pars defect on the right but there is right-sided facet degeneration. CT would be helpful to further evaluate these changes. There is marked left foraminal encroachment with left L5 nerve root impingement. IMPRESSION: 1. Image quality degraded by moderate motion. 2. Moderate levoscoliosis lumbar spine. Multilevel degenerative changes throughout the lumbar spine with spinal and foraminal stenosis as described above. 3. Moderate spinal stenosis L2-3 with moderate subarticular and foraminal stenosis right greater than left 4. 7 mm anterolisthesis L5-S1 with possible pars defect on the left. Marked left foraminal encroachment with impingement left L5 nerve root. 5. These results will be called to the ordering clinician or representative by the Radiologist Assistant, and communication documented in the PACS or [employer]. Electronically Signed   By: Marlan Palau M.D.   On: 05/27/2020 19:57   DG Chest  Portable 1 View  Result Date: 05/27/2020 CLINICAL DATA:  Cough. Ex-smoker. EXAM: PORTABLE CHEST 1 VIEW COMPARISON:  03/13/2020 FINDINGS: Stable enlarged cardiac silhouette and elevated right hemidiaphragm. The pulmonary vasculature is mildly prominent with improvement. Mild diffuse peribronchial thickening and accentuation of the interstitial markings without significant change. Stable moderate scoliosis and diffuse osteopenia. IMPRESSION: 1. No acute abnormality. 2. Stable cardiomegaly and mild chronic bronchitic changes. 3. Stable elevated right hemidiaphragm. Electronically Signed   By: Claudie Revering M.D.   On: 05/27/2020 15:07        Scheduled Meds: .  ascorbic acid  1,000 mg Oral Daily  . aspirin  324 mg Oral Once  . dexamethasone (DECADRON) injection  4 mg Intravenous Q12H  . feeding supplement  1 Container Oral TID BM  . lisinopril  20 mg Oral Daily   Or  . hydrochlorothiazide  12.5 mg Oral Daily  . metoprolol succinate  12.5 mg Oral Daily  . omega-3 acid ethyl esters  1 g Oral BID  . pantoprazole  40 mg Oral Daily  . polyethylene glycol  17 g Oral BID  . rosuvastatin  10 mg Oral Daily  . tamsulosin  0.4 mg Oral QPC supper   Continuous Infusions:   LOS: 1 day    Time spent: 79min  Domenic Polite, MD [facility] 05/28/2020, 11:21 AM

## 2020-05-28 NOTE — Evaluation (Signed)
Physical Therapy Evaluation Patient Details Name: Jesus Fowler MRN: [id number] DOB: 1933-01-03 Today's Date: 05/28/2020   History of Present Illness  Jesus Fowler  is a 86 y.o. male, with past medical history of A. fib, on Eliquis, hypertension, prostate disorder, recurrent cellulitis with chronic lower extremity changes, active sleep apnea on BiPAP (not CPAP), patient was brought by his daughter secondary to progressive weakness, patient was hospitalized last March, he was discharged to SNF, he was discharged back home before 2 days, patient has been ambulating with a walker, he is having PT and OT following at home, patient reports progressive weakness in lower extremities bilaterally, more pronounced over the last 4 to 5 days, he denies any other focal deficits, tingling, numbness, slurred speech or altered mental status.  Clinical Impression  Pt admitted with above diagnosis. Pt was able to stand and transfer to chair with mod assist for overall mobility. Pt may need SNF prior to d/c home as he is recently separated from his wife and his daughter cannot stay with him at all times. Will follow acutely.  Pt currently with functional limitations due to the deficits listed below (see PT Problem List). Pt will benefit from skilled PT to increase their independence and safety with mobility to allow discharge to the venue listed below.      Follow Up Recommendations SNF;Supervision/Assistance - 24 hour    Equipment Recommendations  None recommended by PT    Recommendations for Other Services       Precautions / Restrictions Precautions Precautions: Fall Restrictions Weight Bearing Restrictions: No      Mobility  Bed Mobility Overal bed mobility: Needs Assistance Bed Mobility: Supine to Sit     Supine to sit: Mod assist     General bed mobility comments: incr assist for LEs to eOB and elevation of trunk support needed. Incr time needed to scoot to EOB and a little assist using  pad.   Transfers Overall transfer level: Needs assistance Equipment used: Rolling walker (2 wheeled) Transfers: Sit to/from [employer] Sit to Stand: Mod assist;From elevated surface Stand pivot transfers: Min assist       General transfer comment: Pt needed mod assist to power up and min assist for stand pivot with pt able to mvoe RW on his own.   Ambulation/Gait             General Gait Details: unable today  Stairs            Wheelchair Mobility    Modified Rankin (Stroke Patients Only)       Balance Overall balance assessment: Needs assistance Sitting-balance support: No upper extremity supported;Feet supported Sitting balance-Leahy Scale: Fair     Standing balance support: Bilateral upper extremity supported;During functional activity Standing balance-Leahy Scale: Poor Standing balance comment: relies on UE support on RW and external support for balance                             Pertinent Vitals/Pain Pain Assessment: No/denies pain    Home Living Family/patient expects to be discharged to:: Private residence Living Arrangements: Alone Available Help at Discharge: Family;Available PRN/intermittently Type of Home: House Home Access: Ramped entrance     Home Layout: One level Home Equipment: Walker - 2 wheels;Cane - single point;Shower seat;Bedside commode;Wheelchair - [employer] - 4 wheels Additional Comments: wife moved out with pts son after signing separation papers once pt came home after a recent REhab stay. Daughter  has been helping pt over last few weeks once wife left.     Prior Function Level of Independence: Needs assistance   Gait / Transfers Assistance Needed: walked with rollator in house Modif I   ADL's / Homemaking Assistance Needed: B/D sponge bath   Comments: Daughter came and checked onpt and would bring microwave meals      Hand Dominance   Dominant Hand: Right    Extremity/Trunk  Assessment   Upper Extremity Assessment Upper Extremity Assessment: Defer to OT evaluation    Lower Extremity Assessment Lower Extremity Assessment: Generalized weakness    Cervical / Trunk Assessment Cervical / Trunk Assessment: Kyphotic  Communication   Communication: No difficulties  Cognition Arousal/Alertness: Awake/alert Behavior During Therapy: WFL for tasks assessed/performed Overall Cognitive Status: Within Functional Limits for tasks assessed                                        General Comments General comments (skin integrity, edema, etc.): 86 bpm. 91% RA. 104/66, 100 bpm, 84% onRA withj activity but 90% and > after transfer to chair.  137/62 in chair.     Exercises General Exercises - Lower Extremity Long Arc Quad: AROM;Both;10 reps;Seated   Assessment/Plan    PT Assessment Patient needs continued PT services  PT Problem List Decreased balance;Decreased activity tolerance;Decreased mobility;Decreased knowledge of use of DME;Decreased knowledge of precautions;Decreased safety awareness;Cardiopulmonary status limiting activity       PT Treatment Interventions DME instruction;Gait training;Functional mobility training;Therapeutic activities;Therapeutic exercise;Balance training;Patient/family education    PT Goals (Current goals can be found in the Care Plan section)  Acute Rehab PT Goals Patient Stated Goal: to go back to rehab and then home PT Goal Formulation: With patient Time For Goal Achievement: 06/11/20 Potential to Achieve Goals: Good    Frequency Min 3X/week   Barriers to discharge Decreased caregiver support      Co-evaluation               AM-PAC PT "6 Clicks" Mobility  Outcome Measure Help needed turning from your back to your side while in a flat bed without using bedrails?: A Lot Help needed moving from lying on your back to sitting on the side of a flat bed without using bedrails?: A Lot Help needed moving to and  from a bed to a chair (including a wheelchair)?: A Lot Help needed standing up from a chair using your arms (e.g., wheelchair or bedside chair)?: A Lot Help needed to walk in hospital room?: Total Help needed climbing 3-5 steps with a railing? : Total 6 Click Score: 10    End of Session Equipment Utilized During Treatment: Gait belt;Oxygen Activity Tolerance: Patient limited by fatigue Patient left: in chair;with call bell/phone within reach;with chair alarm set Nurse Communication: Mobility status PT Visit Diagnosis: Unsteadiness on feet (R26.81);Other abnormalities of gait and mobility (R26.89);Muscle weakness (generalized) (M62.81)    Time: 4403-4742 PT Time Calculation (min) (ACUTE ONLY): 23 min   Charges:   PT Evaluation $PT Eval Moderate Complexity: 1 Mod PT Treatments $Therapeutic Activity: 8-22 mins        Dawn W,PT Acute Rehabilitation Services Pager:  [phone]  Office:  [phone]    Denice Paradise 05/28/2020, 1:51 PM

## 2020-05-28 NOTE — Consult Note (Signed)
Chief Complaint   Chief Complaint  Patient presents with  . Weakness    HPI   Consult requested by: Dr Randol Kern, [facility] Reason for consult: Lumbar spondylosis with RLE weakness  HPI: Jesus Fowler is a 86 y.o. male with multiple medical comorbidities who presented to [facility] ED due to progressive weakness. Patient was hospitalized in March due to proctatitis and hematochezia, discharged to SNF and ultimately discharged home 2 days ago. He has been working with [facility] OT/PT and ambulating with a rolling walker. Family has noticed over the last several days progressive weakness and gait instability. Unfortunately, family is leaving and unable to help him at home any longer. Due to the weakness, they brought him back to the ED. Initial work up by EDP unremarkable. He was admitted under [facility] for further evaluation. As part of work up MRI brain and L spine was obtained. MRI brain unremarkable, but MRI L spine showed diffuse degenerative changes. I was called for recommendations. I was advised patient had minimal strength in RLE and rec transfer to [facility] for further evaluation. I came by to see the patient this am upon his arrival to [facility]. Patient reports 4-5 day history of back and R>L leg pain. Endorses weakness primarily with RLE due to pain. Feels unsafe with ambulating even with walker. Has been able to urinate since being admitted - no bowel/bladder dysfunction.  Patient Active Problem List   Diagnosis Date Noted  . Right leg weakness 05/27/2020  . Lumbar radiculopathy 05/27/2020  . Radiculopathy of leg 04/06/2020  . Prolonged Q-T interval on ECG 03/26/2020  . Elevated bilirubin 03/26/2020  . Protein-calorie malnutrition, severe (HCC) 03/21/2020  . GERD without esophagitis 03/21/2020  . Cellulitis of left upper extremity 03/19/2020  . Dyslipidemia 03/19/2020  . Benign prostatic hyperplasia (BPH) with urinary urgency 03/19/2020  . Proctitis   . Rectal bleeding   . Dysphagia   . Early satiety   .  Acute respiratory failure with hypoxia (HCC) 03/14/2020  . OSA on CPAP 03/14/2020  . Obesity (BMI 30-39.9) 03/14/2020  . Cellulitis 03/14/2020  . Venous stasis dermatitis of both lower extremities 03/14/2020  . Chronic constipation 03/14/2020  . Anticoagulated for Afib 03/14/2020  . Acute on chronic heart failure with preserved ejection fraction (HFpEF) /Diastolic Dysfunction 03/14/2020  . Hematochezia 03/14/2020  . Swelling of lower extremity 03/08/2020  . Atrial fibrillation, chronic (HCC) 03/08/2020  . Swelling of both lower extremities 03/08/2020    PMH: Past Medical History:  Diagnosis Date  . A-fib (HCC)   . Cellulitis   . Hemorrhoid   . Hypertension   . Prostate disorder     PSH: Past Surgical History:  Procedure Laterality Date  . BIOPSY  03/17/2020   Procedure: BIOPSY;  Surgeon: Corbin Ade, MD;  Location: [facility];  Service: Endoscopy;;  gastric rectal  . ESOPHAGEAL DILATION N/A 03/17/2020   Procedure: ESOPHAGEAL DILATION;  Surgeon: Corbin Ade, MD;  Location: [facility];  Service: Endoscopy;  Laterality: N/A;  . ESOPHAGOGASTRODUODENOSCOPY (EGD) WITH PROPOFOL N/A 03/17/2020   Procedure: ESOPHAGOGASTRODUODENOSCOPY (EGD) WITH PROPOFOL;  Surgeon: Corbin Ade, MD;  Location: [facility];  Service: Endoscopy;  Laterality: N/A;  . FLEXIBLE SIGMOIDOSCOPY N/A 03/17/2020   Procedure: FLEXIBLE SIGMOIDOSCOPY WITH PROPOFOL;  Surgeon: Corbin Ade, MD;  Location: [facility] END[facility];  Service: Endoscopy;  Laterality: N/A;  . HEMORRHOID SURGERY     patient reports several hemorrhoid surgeries in the past    Medications Prior to Admission  Medication Sig Dispense Refill Last Dose  . acetaminophen (TYLENOL) 325 MG tablet Take 2 tablets (650 mg total) by mouth every 6 (six) hours as needed for mild pain, fever or headache (or Fever >/= 101). 12 tablet 2 05/26/2020 at Unknown time  . albuterol (VENTOLIN HFA) 108 (90 Base) MCG/ACT inhaler Inhale 2 puffs into the lungs  every 6 (six) hours as needed for wheezing or shortness of breath. 6.7 g 0 unknown  . apixaban (ELIQUIS) 2.5 MG TABS tablet Take 1 tablet (2.5 mg total) by mouth 2 (two) times daily. 60 tablet 0 05/26/2020 at 1100  . ascorbic acid (VITAMIN C) 500 MG tablet Take 1,000 mg by mouth daily.   05/26/2020 at Unknown time  . Cholecalciferol (VITAMIN D3) 125 MCG (5000 UT) CAPS Take 1 capsule (5,000 Units total) by mouth daily. (Patient taking differently: Take 3 capsules by mouth daily. ) 30 capsule 1 05/26/2020 at Unknown time  . HYDROcodone-acetaminophen (NORCO/VICODIN) 5-325 MG tablet Take 1 tablet by mouth daily as needed for moderate pain.   05/26/2020 at Unknown time  . lisinopril-hydrochlorothiazide (ZESTORETIC) 20-12.5 MG tablet Take 1 tablet by mouth daily.   05/26/2020 at Unknown time  . metoprolol succinate (TOPROL-XL) 25 MG 24 hr tablet Take 0.5 tablets (12.5 mg total) by mouth daily. 15 tablet 0 05/26/2020 at 1100  . Omega-3 Fatty Acids (FISH OIL BURP-LESS PO) Take 2 capsules by mouth daily.   05/26/2020 at Unknown time  . pantoprazole (PROTONIX) 40 MG tablet Take 1 tablet (40 mg total) by mouth daily. 30 tablet 0 05/26/2020 at Unknown time  . polyethylene glycol (MIRALAX / GLYCOLAX) 17 g packet Take 17 g by mouth 2 (two) times daily. 60 each 3 05/26/2020 at Unknown time  . potassium chloride (KLOR-CON) 10 MEQ tablet Take 1 tablet (10 mEq total) by mouth daily. 30 tablet 0 05/26/2020 at Unknown time  . rosuvastatin (CRESTOR) 10 MG tablet Take 1 tablet (10 mg total) by mouth in the morning. 30 tablet 0 05/26/2020 at Unknown time  . senna-docusate (SENOKOT-S) 8.6-50 MG tablet Take 2 tablets by mouth at bedtime. (Patient taking differently: Take 2 tablets by mouth at bedtime as needed for mild constipation or moderate constipation. ) 60 tablet 1 unknown  . sodium chloride (OCEAN) 0.65 % SOLN nasal spray Place 2 sprays into both nostrils 3 (three) times daily. (Patient taking differently: Place 2 sprays into both  nostrils 3 (three) times daily as needed for congestion. ) 30 mL 0 unknown  . tamsulosin (FLOMAX) 0.4 MG CAPS capsule Take 1 capsule (0.4 mg total) by mouth daily after supper. 30 capsule 0 05/26/2020 at Unknown time  . feeding supplement, ENSURE ENLIVE, (ENSURE ENLIVE) LIQD Take 237 mLs by mouth 2 (two) times daily between meals. (Patient not taking: Reported on 05/27/2020) 23700 mL 1 Not Taking at Unknown time  . gabapentin (NEURONTIN) 100 MG capsule Take 1 capsule (100 mg total) by mouth at bedtime. (Patient not taking: Reported on 05/27/2020) 30 capsule 0 Not Taking at Unknown time  . linaclotide (LINZESS) 145 MCG CAPS capsule Take 1 capsule (145 mcg total) by mouth daily before breakfast. (Patient not taking: Reported on 05/27/2020) 30 capsule 0 Not Taking at Unknown time  . NON FORMULARY May use CPAP from home with home settings. At Bedtime     . torsemide (DEMADEX) 20 MG tablet Take 1 tablet (20 mg total) by mouth daily. (Patient not taking: Reported on 05/27/2020) 30 tablet 0 Not Taking at Unknown time  SH: Social History   Tobacco Use  . Smoking status: Former Games developer  . Smokeless tobacco: Never Used  Substance Use Topics  . Alcohol use: Yes    Comment: "very little"  . Drug use: Never    MEDS: Prior to Admission medications   Medication Sig Start Date End Date Taking? Authorizing Provider  acetaminophen (TYLENOL) 325 MG tablet Take 2 tablets (650 mg total) by mouth every 6 (six) hours as needed for mild pain, fever or headache (or Fever >/= 101). 03/14/20  Yes Emokpae, Courage, MD  albuterol (VENTOLIN HFA) 108 (90 Base) MCG/ACT inhaler Inhale 2 puffs into the lungs every 6 (six) hours as needed for wheezing or shortness of breath. 04/22/20  Yes Sharee Holster, NP  apixaban (ELIQUIS) 2.5 MG TABS tablet Take 1 tablet (2.5 mg total) by mouth 2 (two) times daily. 04/22/20  Yes Sharee Holster, NP  ascorbic acid (VITAMIN C) 500 MG tablet Take 1,000 mg by mouth daily.   Yes [provider]  Cholecalciferol (VITAMIN D3) 125 MCG (5000 UT) CAPS Take 1 capsule (5,000 Units total) by mouth daily. Patient taking differently: Take 3 capsules by mouth daily.  03/18/20  Yes Emokpae, Courage, MD  HYDROcodone-acetaminophen (NORCO/VICODIN) 5-325 MG tablet Take 1 tablet by mouth daily as needed for moderate pain.   Yes [provider]  lisinopril-hydrochlorothiazide (ZESTORETIC) 20-12.5 MG tablet Take 1 tablet by mouth daily. 04/28/20  Yes [provider]  metoprolol succinate (TOPROL-XL) 25 MG 24 hr tablet Take 0.5 tablets (12.5 mg total) by mouth daily. 04/22/20  Yes Sharee Holster, NP  Omega-3 Fatty Acids (FISH OIL BURP-LESS PO) Take 2 capsules by mouth daily.   Yes [provider]  pantoprazole (PROTONIX) 40 MG tablet Take 1 tablet (40 mg total) by mouth daily. 04/22/20  Yes Sharee Holster, NP  polyethylene glycol (MIRALAX / GLYCOLAX) 17 g packet Take 17 g by mouth 2 (two) times daily. 03/18/20  Yes Emokpae, Courage, MD  potassium chloride (KLOR-CON) 10 MEQ tablet Take 1 tablet (10 mEq total) by mouth daily. 04/22/20  Yes Sharee Holster, NP  rosuvastatin (CRESTOR) 10 MG tablet Take 1 tablet (10 mg total) by mouth in the morning. 04/22/20  Yes Sharee Holster, NP  senna-docusate (SENOKOT-S) 8.6-50 MG tablet Take 2 tablets by mouth at bedtime. Patient taking differently: Take 2 tablets by mouth at bedtime as needed for mild constipation or moderate constipation.  03/14/20 03/14/21 Yes Emokpae, Courage, MD  sodium chloride (OCEAN) 0.65 % SOLN nasal spray Place 2 sprays into both nostrils 3 (three) times daily. Patient taking differently: Place 2 sprays into both nostrils 3 (three) times daily as needed for congestion.  03/14/20  Yes Emokpae, Courage, MD  tamsulosin (FLOMAX) 0.4 MG CAPS capsule Take 1 capsule (0.4 mg total) by mouth daily after supper. 04/22/20  Yes Sharee Holster, NP  feeding supplement, ENSURE ENLIVE, (ENSURE ENLIVE) LIQD Take 237 mLs by  mouth 2 (two) times daily between meals. Patient not taking: Reported on 05/27/2020 03/14/20   Shon Hale, MD  gabapentin (NEURONTIN) 100 MG capsule Take 1 capsule (100 mg total) by mouth at bedtime. Patient not taking: Reported on 05/27/2020 04/22/20   Sharee Holster, NP  linaclotide [facility]) 145 MCG CAPS capsule Take 1 capsule (145 mcg total) by mouth daily before breakfast. Patient not taking: Reported on 05/27/2020 04/22/20   Sharee Holster, NP  NON FORMULARY May use CPAP from home with home settings. At Bedtime  [provider]  torsemide (DEMADEX) 20 MG tablet Take 1 tablet (20 mg total) by mouth daily. Patient not taking: Reported on 05/27/2020 04/22/20   Gerlene Fee, NP    ALLERGY: No Known Allergies  Social History   Tobacco Use  . Smoking status: Former Research scientist (life sciences)  . Smokeless tobacco: Never Used  Substance Use Topics  . Alcohol use: Yes    Comment: "very little"     Family History  Problem Relation Age of Onset  . Colon cancer Neg Hx      ROS   Review of Systems  Musculoskeletal: Positive for back pain and myalgias.  Neurological: Positive for tingling (BLE).  All other systems reviewed and are negative.   Exam   Vitals:   05/28/20 0550 05/28/20 0804  BP:    Pulse: 85   Resp: 16 18  Temp: 98.6 F (37 C)   SpO2:     General appearance: elderly male, resting comfortably Eyes: No scleral injection Cardiovascular: Regular rate and rhythm without murmurs, rubs, gallops. No edema or variciosities. Distal pulses normal. Pulmonary: Effort normal, non-labored breathing Musculoskeletal:     Muscle tone upper extremities: Normal    Muscle tone lower extremities: Normal    Motor exam: Upper Extremities Deltoid Bicep Tricep Grip  Right 5/5 5/5 5/5 5/5  Left 5/5 5/5 5/5 5/5   Lower Extremity IP Quad PF DF EHL  Right 3/5 4/5 5/5 5/5 5/5  Left 5/5 5/5 5/5 5/5 5/5   Neurological Mental Status:    - Patient is awake, alert, oriented to  person, place, month, year, and situation    - Patient is able to give a clear and coherent history.    - No signs of aphasia or neglect Cranial Nerves    - II: Visual Fields are full. PERRL    - III/IV/VI: EOMI without ptosis or diploplia.     - V: Facial sensation is grossly normal    - VII: Facial movement is symmetric.     - VIII: hearing is intact to voice    - X: Uvula elevates symmetrically    - XI: Shoulder shrug is symmetric.    - XII: tongue is midline without atrophy or fasciculations.  Sensory: Sensation grossly intact to LT   Results - Imaging/Labs   Results for orders placed or performed during the hospital encounter of 05/27/20 (from the past 48 hour(s))  Urinalysis, Routine w reflex microscopic     Status: Abnormal   Collection Time: 05/27/20 11:22 AM  Result Value Ref Range   Color, Urine YELLOW YELLOW   APPearance CLEAR CLEAR   Specific Gravity, Urine 1.021 1.005 - 1.030   pH 5.0 5.0 - 8.0   Glucose, UA NEGATIVE NEGATIVE mg/dL   Hgb urine dipstick NEGATIVE NEGATIVE   Bilirubin Urine NEGATIVE NEGATIVE   Ketones, ur NEGATIVE NEGATIVE mg/dL   Protein, ur 30 (A) NEGATIVE mg/dL   Nitrite NEGATIVE NEGATIVE   Leukocytes,Ua NEGATIVE NEGATIVE   RBC / HPF 11-20 0 - 5 RBC/hpf   WBC, UA 6-10 0 - 5 WBC/hpf   Bacteria, UA FEW (A) NONE SEEN   Mucus PRESENT    Ca Oxalate Crys, UA PRESENT     Comment: Performed at [facility], [address], [place], [place] [address]  Comprehensive metabolic panel     Status: Abnormal   Collection Time: 05/27/20 11:35 AM  Result Value Ref Range   Sodium 141 135 - 145 mmol/L   Potassium 3.5 3.5 -  5.1 mmol/L   Chloride 100 98 - 111 mmol/L   CO2 29 22 - 32 mmol/L   Glucose, Bld 99 70 - 99 mg/dL    Comment: Glucose reference range applies only to samples taken after fasting for at least 8 hours.   BUN 20 8 - 23 mg/dL   Creatinine, Ser 1.61 0.61 - 1.24 mg/dL   Calcium 9.6 8.9 - 09.6 mg/dL   Total Protein 7.0 6.5 - 8.1 g/dL   Albumin  3.2 (L) 3.5 - 5.0 g/dL   AST 30 15 - 41 U/L   ALT 17 0 - 44 U/L   Alkaline Phosphatase 67 38 - 126 U/L   Total Bilirubin 2.6 (H) 0.3 - 1.2 mg/dL   GFR calc non Af Amer >60 >60 mL/min   GFR calc Af Amer >60 >60 mL/min   Anion gap 12 5 - 15    Comment: Performed at [facility], [address], [place], [place] [address]  CBC with Differential     Status: Abnormal   Collection Time: 05/27/20 11:35 AM  Result Value Ref Range   WBC 10.3 4.0 - 10.5 K/uL   RBC 4.34 4.22 - 5.81 MIL/uL   Hemoglobin 14.1 13.0 - 17.0 g/dL   HCT 98.1 19.1 - 47.8 %   MCV 102.1 (H) 80.0 - 100.0 fL   MCH 32.5 26.0 - 34.0 pg   MCHC 31.8 30.0 - 36.0 g/dL   RDW 29.5 62.1 - 30.8 %   Platelets 199 150 - 400 K/uL   nRBC 0.0 0.0 - 0.2 %   Neutrophils Relative % 74 %   Neutro Abs 7.6 1.7 - 7.7 K/uL   Lymphocytes Relative 15 %   Lymphs Abs 1.6 0.7 - 4.0 K/uL   Monocytes Relative 10 %   Monocytes Absolute 1.0 0.1 - 1.0 K/uL   Eosinophils Relative 1 %   Eosinophils Absolute 0.1 0.0 - 0.5 K/uL   Basophils Relative 0 %   Basophils Absolute 0.0 0.0 - 0.1 K/uL   Immature Granulocytes 0 %   Abs Immature Granulocytes 0.03 0.00 - 0.07 K/uL    Comment: Performed at [facility], [address] [place], [place] [address]  Brain natriuretic peptide     Status: Abnormal   Collection Time: 05/27/20 11:35 AM  Result Value Ref Range   B Natriuretic Peptide 281.0 (H) 0.0 - 100.0 pg/mL    Comment: Performed at [facility], [address] [place], [place] [address]  TSH     Status: None   Collection Time: 05/27/20 11:35 AM  Result Value Ref Range   TSH 0.465 0.350 - 4.500 uIU/mL    Comment: Performed by a 3rd Generation assay with a functional sensitivity of <=0.01 uIU/mL. Performed at [facility], [address], [place], [place] [address]   Magnesium     Status: None   Collection Time: 05/27/20 11:35 AM  Result Value Ref Range   Magnesium 2.0 1.7 - 2.4 mg/dL    Comment: Performed at [facility], [address],  [place], [place] [address]  Troponin I (High Sensitivity)     Status: None   Collection Time: 05/27/20  3:59 PM  Result Value Ref Range   Troponin I (High Sensitivity) 12 <18 ng/L    Comment: (NOTE) Elevated high sensitivity troponin I (hsTnI) values and significant  changes across serial measurements may suggest ACS but many other  chronic and acute conditions are known to elevate hsTnI results.  Refer to the "Links"  section for chest pain algorithms and additional  guidance. Performed at [facility], [address], [place], [place] [address]   SARS Coronavirus 2 by RT PCR (hospital order, performed in [facility] hospital lab) Nasopharyngeal Nasopharyngeal Swab     Status: None   Collection Time: 05/27/20  5:54 PM   Specimen: Nasopharyngeal Swab  Result Value Ref Range   SARS Coronavirus 2 NEGATIVE NEGATIVE    Comment: (NOTE) SARS-CoV-2 target nucleic acids are NOT DETECTED. The SARS-CoV-2 RNA is generally detectable in upper and lower respiratory specimens during the acute phase of infection. The lowest concentration of SARS-CoV-2 viral copies this assay can detect is 250 copies / mL. A negative result does not preclude SARS-CoV-2 infection and should not be used as the sole basis for treatment or other patient management decisions.  A negative result may occur with improper specimen collection / handling, submission of specimen other than nasopharyngeal swab, presence of viral mutation(s) within the areas targeted by this assay, and inadequate number of viral copies (<250 copies / mL). A negative result must be combined with clinical observations, patient history, and epidemiological information. Fact Sheet for Patients:   BoilerBrush.com.cy Fact Sheet for Healthcare Providers: https://pope.com/ This test is not yet approved or cleared  by the [place] FDA and has been authorized for detection and/or diagnosis of SARS-CoV-2  by FDA under an Emergency Use Authorization (EUA).  This EUA will remain in effect (meaning this test can be used) for the duration of the COVID-19 declaration under Section 564(b)(1) of the Act, 21 U.S.C. section 360bbb-3(b)(1), unless the authorization is terminated or revoked sooner. Performed at [facility], [address] [place], [place] [address]     CT Head Wo Contrast  Result Date: 05/27/2020 CLINICAL DATA:  Right leg weakness EXAM: CT HEAD WITHOUT CONTRAST TECHNIQUE: Contiguous axial images were obtained from the base of the skull through the vertex without intravenous contrast. COMPARISON:  None. FINDINGS: Brain: Mild atrophy. No hydrocephalus. Negative for acute infarct, hemorrhage, mass Vascular: Negative for hyperdense vessel Skull: Negative Sinuses/Orbits: Paranasal sinuses clear. Bilateral cataract extraction. Other: None IMPRESSION: Mild atrophy.  No acute abnormality Electronically Signed   By: Marlan Palau M.D.   On: 05/27/2020 17:12   MR BRAIN WO CONTRAST  Result Date: 05/27/2020 CLINICAL DATA:  Right leg weakness. EXAM: MRI HEAD WITHOUT CONTRAST TECHNIQUE: Multiplanar, multiecho pulse sequences of the brain and surrounding structures were obtained without intravenous contrast. COMPARISON:  CT head today FINDINGS: Brain: Motion degraded study. Negative for acute infarct. Generalized atrophy. Mild chronic microvascular ischemic change in the white matter. Negative for hemorrhage mass or fluid collection. Vascular: Normal arterial flow voids. Skull and upper cervical spine: Negative Sinuses/Orbits: Paranasal sinuses clear. Bilateral cataract extraction Other: None IMPRESSION: No acute abnormality Motion degraded study Atrophy with mild chronic microvascular ischemic change in the white matter. Electronically Signed   By: Marlan Palau M.D.   On: 05/27/2020 19:50   MR LUMBAR SPINE WO CONTRAST  Result Date: 05/27/2020 CLINICAL DATA:  Low back pain right leg weakness EXAM: MRI  LUMBAR SPINE WITHOUT CONTRAST TECHNIQUE: Multiplanar, multisequence MR imaging of the lumbar spine was performed. No intravenous contrast was administered. COMPARISON:  Lumbar radiographs 06/30/2013 FINDINGS: Segmentation:  Normal Alignment:  Moderate levoscoliosis in the lumbar spine 7 mm anterolisthesis L5-S1 similar to the prior study. Vertebrae: Negative for fracture or mass. Discogenic edema on the right at L1-2 and L2-3 related to scoliosis. Conus medullaris and cauda equina: Conus extends to the L1-2  level. Conus and cauda equina appear normal. Paraspinal and other soft tissues: No paraspinous mass or adenopathy. 2 cm left renal cyst. Disc levels: T12-L1 mild disc degeneration L1-2: Asymmetric disc degeneration and spurring on the right. Mild spinal stenosis. Moderate subarticular and foraminal stenosis on the right. L2-3: Asymmetric disc degeneration and spurring on the right with moderate subarticular and foraminal stenosis right greater than left. Moderate spinal stenosis L3-4: Disc degeneration with disc bulging and endplate spurring. Mild subarticular stenosis bilaterally. Mild facet degeneration L4-5: Disc degeneration asymmetric on the left with spurring. Bilateral facet degeneration. Moderate subarticular stenosis on the left. L5-S1: 7 mm anterolisthesis. Possible pars defect on the left. No pars defect on the right but there is right-sided facet degeneration. CT would be helpful to further evaluate these changes. There is marked left foraminal encroachment with left L5 nerve root impingement. IMPRESSION: 1. Image quality degraded by moderate motion. 2. Moderate levoscoliosis lumbar spine. Multilevel degenerative changes throughout the lumbar spine with spinal and foraminal stenosis as described above. 3. Moderate spinal stenosis L2-3 with moderate subarticular and foraminal stenosis right greater than left 4. 7 mm anterolisthesis L5-S1 with possible pars defect on the left. Marked left foraminal  encroachment with impingement left L5 nerve root. 5. These results will be called to the ordering clinician or representative by the Radiologist Assistant, and communication documented in the PACS or [employer]. Electronically Signed   By: Marlan Palau M.D.   On: 05/27/2020 19:57   DG Chest Portable 1 View  Result Date: 05/27/2020 CLINICAL DATA:  Cough. Ex-smoker. EXAM: PORTABLE CHEST 1 VIEW COMPARISON:  03/13/2020 FINDINGS: Stable enlarged cardiac silhouette and elevated right hemidiaphragm. The pulmonary vasculature is mildly prominent with improvement. Mild diffuse peribronchial thickening and accentuation of the interstitial markings without significant change. Stable moderate scoliosis and diffuse osteopenia. IMPRESSION: 1. No acute abnormality. 2. Stable cardiomegaly and mild chronic bronchitic changes. 3. Stable elevated right hemidiaphragm. Electronically Signed   By: Beckie Salts M.D.   On: 05/27/2020 15:07   Impression/Plan   86 y.o. male with several day history of progressive back and R>L leg pain and associated RLE weakness and gait instability. He has essentially normal strength with exception of pain mediated weakness right HF and quad (patient says "I can do it, it just hurts").  MRI L spine was reviewed. He certainly has multilevel degenerative changes including spondylolisthesis L5-S1 and varying degrees of multilevel foraminal stenosis. Given the acute presentation, I do not think he requires any emergent NS intervention. Rec optimizing pain control with steroids (on 4mg  q 6 hours) and gabapentin (on 100 q hs, increase as needed). He will need to work with PT/OT and ultimately sounds like he will need SNF placement. He can follow up with on an outpatient basis, although I am not sure he is an optimal surgical candidate. Please call for any concerns.  [place], PA-C [facility] [place]

## 2020-05-29 DIAGNOSIS — R5381 Other malaise: Secondary | ICD-10-CM

## 2020-05-29 LAB — BASIC METABOLIC PANEL
Anion gap: 7 (ref 5–15)
BUN: 32 mg/dL — ABNORMAL HIGH (ref 8–23)
CO2: 26 mmol/L (ref 22–32)
Calcium: 9 mg/dL (ref 8.9–10.3)
Chloride: 104 mmol/L (ref 98–111)
Creatinine, Ser: 0.86 mg/dL (ref 0.61–1.24)
GFR calc Af Amer: >60 mL/min (ref >60)
GFR calc non Af Amer: >60 mL/min (ref >60)
Glucose, Bld: 152 mg/dL — ABNORMAL HIGH (ref 70–99)
Potassium: 4.2 mmol/L (ref 3.5–5.1)
Sodium: 137 mmol/L (ref 135–145)

## 2020-05-29 LAB — URINE CULTURE: Culture: NO GROWTH

## 2020-05-29 LAB — CBC
HCT: 41.1 % (ref 39.0–52.0)
Hemoglobin: 13.1 g/dL (ref 13.0–17.0)
MCH: 32.3 pg (ref 26.0–34.0)
MCHC: 31.9 g/dL (ref 30.0–36.0)
MCV: 101.5 fL — ABNORMAL HIGH (ref 80.0–100.0)
Platelets: 188 10*3/uL (ref 150–400)
RBC: 4.05 MIL/uL — ABNORMAL LOW (ref 4.22–5.81)
RDW: 14.5 % (ref 11.5–15.5)
WBC: 14.9 10*3/uL — ABNORMAL HIGH (ref 4.0–10.5)
nRBC: 0 % (ref 0.0–0.2)

## 2020-05-29 MED ORDER — DEXAMETHASONE 2 MG PO TABS
2.0000 mg | ORAL_TABLET | Freq: Two times a day (BID) | ORAL | Status: DC
  Administered 2020-05-29: 2 mg via ORAL
  Filled 2020-05-29 (×3): qty 1

## 2020-05-29 MED ORDER — DEXAMETHASONE 2 MG PO TABS
2.0000 mg | ORAL_TABLET | Freq: Two times a day (BID) | ORAL | Status: AC
  Administered 2020-05-29 – 2020-05-30 (×2): 2 mg via ORAL
  Filled 2020-05-29 (×2): qty 1

## 2020-05-29 NOTE — NC FL2 (Signed)
[place] LEVEL OF CARE SCREENING TOOL     IDENTIFICATION  Patient Name: Jesus Fowler Birthdate: February 23, 1933 Sex: male Admission Date (Current Location): 05/27/2020  [facility] and [place] Number:  Herbalist and Address:  [facility] [place]. [facility], [place] [address] [place], [place] [address]      Provider Number: [id number]  Attending Physician Name and Address:  Domenic Polite, MD  Relative Name and Phone Number:  Mechele Claude [phone]    Current Level of Care: Hospital Recommended Level of Care: [place] Prior Approval Number:    Date Approved/Denied:   PASRR Number: [id number]  Discharge Plan: SNF    Current Diagnoses: Patient Active Problem List   Diagnosis Date Noted  . Right leg weakness 05/27/2020  . Lumbar radiculopathy 05/27/2020  . Radiculopathy of leg 04/06/2020  . Prolonged Q-T interval on ECG 03/26/2020  . Elevated bilirubin 03/26/2020  . Protein-calorie malnutrition, severe ([place]) 03/21/2020  . GERD without esophagitis 03/21/2020  . Cellulitis of left upper extremity 03/19/2020  . Dyslipidemia 03/19/2020  . Benign prostatic hyperplasia (BPH) with urinary urgency 03/19/2020  . Proctitis   . Rectal bleeding   . Dysphagia   . Early satiety   . Acute respiratory failure with hypoxia ([place]) 03/14/2020  . OSA on CPAP 03/14/2020  . Obesity (BMI 30-39.9) 03/14/2020  . Cellulitis 03/14/2020  . Venous stasis dermatitis of both lower extremities 03/14/2020  . Chronic constipation 03/14/2020  . Anticoagulated for Afib 03/14/2020  . Acute on chronic heart failure with preserved ejection fraction (HFpEF) /Diastolic Dysfunction 10/93/2355  . Hematochezia 03/14/2020  . Swelling of lower extremity 03/08/2020  . Atrial fibrillation, chronic ([place]) 03/08/2020  . Swelling of both lower extremities 03/08/2020    Orientation RESPIRATION BLADDER Height & Weight     Self, Time, Situation, Place  O2(2l) External  catheter, Incontinent Weight: 196 lb 10.4 oz (89.2 kg) Height:  5\' 5"  (165.1 cm)  BEHAVIORAL SYMPTOMS/MOOD NEUROLOGICAL BOWEL NUTRITION STATUS      Continent Diet(see discharge summary)  AMBULATORY STATUS COMMUNICATION OF NEEDS Skin   Limited Assist Verbally Skin abrasions(abraisons right knee, echhymosis right arm)                       Personal Care Assistance Level of Assistance  Bathing, Feeding, Dressing Bathing Assistance: Limited assistance Feeding assistance: Independent Dressing Assistance: Limited assistance     Functional Limitations Info  Sight, Hearing, Speech Sight Info: Impaired Hearing Info: Adequate Speech Info: Adequate    SPECIAL CARE FACTORS FREQUENCY  PT (By licensed PT), OT (By licensed OT)     PT Frequency: 5x per week OT Frequency: 5x per week            Contractures Contractures Info: Not present    Additional Factors Info  Code Status, Allergies Code Status Info: Full Allergies Info: NKA           Current Medications (05/29/2020):  This is the current hospital active medication list Current Facility-Administered Medications  Medication Dose Route Frequency Provider Last Rate Last Admin  . acetaminophen (TYLENOL) tablet 650 mg  650 mg Oral Q6H PRN Elgergawy, Dawood S, MD      . albuterol (PROVENTIL) (2.5 MG/3ML) 0.083% nebulizer solution 3 mL  3 mL Inhalation Q6H PRN Elgergawy, Silver Huguenin, MD      . apixaban (ELIQUIS) tablet 5 mg  5 mg Oral BID Domenic Polite, MD   5 mg at 05/29/20 1014  .  ascorbic acid (VITAMIN C) tablet 1,000 mg  1,000 mg Oral Daily Elgergawy, Leana Roe, MD   1,000 mg at 05/29/20 1014  . aspirin chewable tablet 324 mg  324 mg Oral Once Elgergawy, Leana Roe, MD      . dexamethasone (DECADRON) tablet 2 mg  2 mg Oral Q12H Zannie Cove, MD      . feeding supplement (BOOST / RESOURCE BREEZE) liquid 1 Container  1 Container Oral TID BM Zannie Cove, MD   1 Container at 05/29/20 1014  . HYDROcodone-acetaminophen  (NORCO/VICODIN) 5-325 MG per tablet 1 tablet  1 tablet Oral Daily PRN Elgergawy, Leana Roe, MD      . metoprolol succinate (TOPROL-XL) 24 hr tablet 12.5 mg  12.5 mg Oral Daily Elgergawy, Leana Roe, MD   12.5 mg at 05/29/20 1014  . omega-3 acid ethyl esters (LOVAZA) capsule 1 g  1 g Oral BID Elgergawy, Leana Roe, MD   1 g at 05/29/20 1014  . pantoprazole (PROTONIX) EC tablet 40 mg  40 mg Oral Daily Elgergawy, Leana Roe, MD   40 mg at 05/29/20 1014  . polyethylene glycol (MIRALAX / GLYCOLAX) packet 17 g  17 g Oral BID Elgergawy, Leana Roe, MD   17 g at 05/29/20 1014  . rosuvastatin (CRESTOR) tablet 10 mg  10 mg Oral Daily Elgergawy, Leana Roe, MD   10 mg at 05/29/20 1014  . senna-docusate (Senokot-S) tablet 2 tablet  2 tablet Oral QHS PRN Elgergawy, Leana Roe, MD   2 tablet at 05/28/20 1526  . sodium chloride (OCEAN) 0.65 % nasal spray 2 spray  2 spray Each Nare TID PRN Elgergawy, Leana Roe, MD      . tamsulosin (FLOMAX) capsule 0.4 mg  0.4 mg Oral QPC supper Elgergawy, Leana Roe, MD   0.4 mg at 05/28/20 1806     Discharge Medications: Please see discharge summary for a list of discharge medications.  Relevant Imaging Results:  Relevant Lab Results:   Additional Information SSN# [id number] [place], [place]

## 2020-05-29 NOTE — TOC Initial Note (Addendum)
Transition of Care [facility]) - Initial/Assessment Note    Patient Details  Name: Jesus Fowler MRN: [id number] Date of Birth: 08/03/1933  Transition of [facility]) CM/SW Contact:    Jesus Castilla, LCSW Phone Number: [phone] 05/29/2020, 4:26 PM  Clinical Narrative:                 CSW met with patient to discuss PT recommendation of a SNF. Patient was aware of recommendation and in agreement with going to a ST SNF. CSW discussed the SNF process.CSW provided patient with medicare.gov rating list. Due to the sight of patient CSW suggested that when the bed offers come back that they can be reviewed with him. Patient gave CSW permission to fax referrals out to local facilities.CSW answered questions about the SNF process and the next steps in the process. Patient prefers [facility] due to him being there before.Patient asked that CSW update daughter Jesus Fowler. CSW was unable to reach daughter however updated son Jesus Fowler.  TOC team will continue to assist for discharge planning needs.      Expected Discharge Plan: Skilled Nursing Facility Barriers to Discharge: SNF Pending bed offer, Insurance Authorization   Patient Goals and CMS Choice Patient states their goals for this hospitalization and ongoing recovery are:: To be able to home   Choice offered to / list presented to : Patient  Expected Discharge Plan and Services Expected Discharge Plan: [place] arrangements for the past 2 months: Single Family Home                                      Prior Living Arrangements/Services Living arrangements for the past 2 months: Single Family Home Lives with:: Self Patient language and need for interpreter reviewed:: Yes Do you feel safe going back to the place where you live?: Yes        Care giver support system in place?: Yes (comment)      Activities of Daily Living Home Assistive Devices/Equipment: Wheelchair, BIPAP, Shower chair without  back, Environmental consultant (specify type), Cane (specify quad or straight) ADL Screening (condition at time of admission) Patient's cognitive ability adequate to safely complete daily activities?: Yes Is the patient deaf or have difficulty hearing?: No Does the patient have difficulty seeing, even when wearing glasses/contacts?: No Does the patient have difficulty concentrating, remembering, or making decisions?: Yes Patient able to express need for assistance with ADLs?: Yes Does the patient have difficulty dressing or bathing?: Yes Independently performs ADLs?: No Communication: Independent Dressing (OT): Independent Is this a change from baseline?: Pre-admission baseline Grooming: Independent Is this a change from baseline?: Pre-admission baseline Feeding: Independent Bathing: Independent with device (comment) Is this a change from baseline?: Pre-admission baseline Toileting: Independent Is this a change from baseline?: Pre-admission baseline In/Out Bed: Independent with device (comment) Is this a change from baseline?: Pre-admission baseline Walks in Home: Independent Does the patient have difficulty walking or climbing stairs?: Yes Weakness of Legs: Both Weakness of Arms/Hands: None  Permission Sought/Granted      Share Information with NAME: Jesus Fowler  Permission granted to share info w AGENCY: SNFs  Permission granted to share info w Relationship: daughter  Permission granted to share info w Contact Information: [phone]  Emotional Assessment Appearance:: Appears stated age Attitude/Demeanor/Rapport: Engaged Affect (typically observed): Appropriate Orientation: : Oriented to Self, Oriented to Place, Oriented  to  Time, Oriented to Situation      Admission diagnosis:  Lumbar radiculopathy [M54.16] General weakness [R53.1] Physical deconditioning [R53.81] Bilateral leg edema [R60.0] Right leg weakness [R29.898] Patient Active Problem List   Diagnosis Date Noted  . Right leg  weakness 05/27/2020  . Lumbar radiculopathy 05/27/2020  . Radiculopathy of leg 04/06/2020  . Prolonged Q-T interval on ECG 03/26/2020  . Elevated bilirubin 03/26/2020  . Protein-calorie malnutrition, severe ([place]) 03/21/2020  . GERD without esophagitis 03/21/2020  . Cellulitis of left upper extremity 03/19/2020  . Dyslipidemia 03/19/2020  . Benign prostatic hyperplasia (BPH) with urinary urgency 03/19/2020  . Proctitis   . Rectal bleeding   . Dysphagia   . Early satiety   . Acute respiratory failure with hypoxia ([place]) 03/14/2020  . OSA on CPAP 03/14/2020  . Obesity (BMI 30-39.9) 03/14/2020  . Cellulitis 03/14/2020  . Venous stasis dermatitis of both lower extremities 03/14/2020  . Chronic constipation 03/14/2020  . Anticoagulated for Afib 03/14/2020  . Acute on chronic heart failure with preserved ejection fraction (HFpEF) /Diastolic Dysfunction 07/11/1974  . Hematochezia 03/14/2020  . Swelling of lower extremity 03/08/2020  . Atrial fibrillation, chronic ([place]) 03/08/2020  . Swelling of both lower extremities 03/08/2020   PCP:  Lucia Gaskins, MD Pharmacy:   [place], [place] [address] Phone: [phone] Fax: [phone]     Social Determinants of Health (SDOH) Interventions    Readmission Risk Interventions Readmission Risk Prevention Plan 03/11/2020  Medication Screening Complete  Transportation Screening Complete  Some recent data might be hidden

## 2020-05-29 NOTE — Progress Notes (Addendum)
PROGRESS NOTE    Jesus Fowler  KNL:[id number] DOB: 04-10-1933 DOA: 05/27/2020 PCP: Lucia Gaskins, MD  Brief Narrative:Jesus Fowler  is a 86 y.o. male, with past medical history of A. fib, on Eliquis, hypertension, prostate disorder, recurrent cellulitis with chronic lower extremity changes, active sleep apnea on BiPAP (not CPAP), patient was brought by his daughter secondary to progressive weakness, patient was hospitalized last March, he was discharged to SNF, he was discharged back home before 2 days, patient has been ambulating with a walker, he is having PT and OT following at home, patient reports progressive weakness in lower extremities bilaterally, more pronounced over the last 4 to 5 days, he denies any other focal deficits, tingling, numbness, slurred speech or altered mental status. -in ED CT head with no acute findings, but patient was noticed to have 3/5 weakness in right lower extremity, patient had no significant lab abnormality, treated hospitalist was consulted to admit.  Assessment & Plan:   Bilateral lower leg weakness Pain and radiculopathy -MRI LS spine notes significant lumbar spinal stenosis and radiculopathy at L5/S1 -Started on IV Decadron on admission, will decrease dose and change this to p.o. -Tylenol, PRN Vicodin -Neurosurgery Dr. Kathyrn Sheriff consulted, recommend pain control, physical therapy -PT OT eval completed, SNF recommended for rehab -Social work consulted  Leukocytosis -Could be from steroids, afebrile, monitor  Chronic atrial fibrillation  -Continue metoprolol, heart rate controlled, resumed Eliquis  Hypertension,  -blood pressure on the low side -Lisinopril and HCTZ stopped  OSA -Continue with BiPAP at bedtime(not CPAP)  Dyslipidemia -Continue with statin  GERD -Continue with PPI  Chronic venous stasis dermatitis of both lower extremities -Continue with skin care, unna boots as needed  DVT Prophylaxis : on  Eliquis  Code Status: Full code Family Communication: Discussed with patient in detail, no family at bedside, unable to reach daughter, called her cell phone, voicemail not activated Disposition Plan:  Status is: Inpatient  Remains inpatient appropriate because: Needs SNF for rehab, unsafe discharge home alone   Dispo: The patient is from: Home              Anticipated d/c is to: SNF              Anticipated d/c date is: Whenever rehab bed is available              Patient currently is medically stable to d/c.  Consultants:   Neurosurgery   Procedures:   Antimicrobials:    Subjective: -Continues to have lower leg pain and weakness, able to take a few steps with physical therapy yesterday  Objective: Vitals:   05/28/20 1957 05/29/20 0134 05/29/20 0519 05/29/20 0800  BP: (!) 109/52 109/71    Pulse: 76     Resp: 16     Temp: 98.9 F (37.2 C) 98 F (36.7 C) 97.9 F (36.6 C) 97.8 F (36.6 C)  TempSrc: Oral Oral Oral Oral  SpO2: 95% 93%    Weight:  89.2 kg    Height:        Intake/Output Summary (Last 24 hours) at 05/29/2020 1213 Last data filed at 05/28/2020 2219 Gross per 24 hour  Intake 1420 ml  Output 700 ml  Net 720 ml   Filed Weights   05/27/20 2319 05/28/20 0550 05/29/20 0134  Weight: 86.1 kg 86.7 kg 89.2 kg    Examination:  General exam: Elderly chronically ill male laying in bed, AAOx3, no distress HEENT: No JVD CVS S1-S2 regular rate rhythm Lungs clear bilaterally  Abdomen is soft nontender nondistended, bowel sounds present  Central nervous system: Bilateral leg weakness, right> left. Extremities: No edema, Unna boots on both lower legs  skin: Chronic venous stasis changes in both lower legs wrapped Psychiatry: Mood and affect is appropriate    Data Reviewed:   CBC: Recent Labs  Lab 05/27/20 1135 05/28/20 0910 05/29/20 0645  WBC 10.3 7.4 14.9*  NEUTROABS 7.6  --   --   HGB 14.1 14.7 13.1  HCT 44.3 45.7 41.1  MCV 102.1* 101.1*  101.5*  PLT 199 203 188   Basic Metabolic Panel: Recent Labs  Lab 05/27/20 1135 05/28/20 0910 05/29/20 0645  NA 141 143 137  K 3.5 4.5 4.2  CL 100 104 104  CO2 29 27 26   GLUCOSE 99 117* 152*  BUN 20 16 32*  CREATININE 0.89 0.94 0.86  CALCIUM 9.6 9.5 9.0  MG 2.0  --   --    GFR: Estimated Creatinine Clearance: 65.7 mL/min (by C-G formula based on SCr of 0.86 mg/dL). Liver Function Tests: Recent Labs  Lab 05/27/20 1135  AST 30  ALT 17  ALKPHOS 67  BILITOT 2.6*  PROT 7.0  ALBUMIN 3.2*   No results for input(s): LIPASE, AMYLASE in the last 168 hours. No results for input(s): AMMONIA in the last 168 hours. Coagulation Profile: No results for input(s): INR, PROTIME in the last 168 hours. Cardiac Enzymes: No results for input(s): CKTOTAL, CKMB, CKMBINDEX, TROPONINI in the last 168 hours. BNP (last 3 results) No results for input(s): PROBNP in the last 8760 hours. HbA1C: No results for input(s): HGBA1C in the last 72 hours. CBG: No results for input(s): GLUCAP in the last 168 hours. Lipid Profile: No results for input(s): CHOL, HDL, LDLCALC, TRIG, CHOLHDL, LDLDIRECT in the last 72 hours. Thyroid Function Tests: Recent Labs    05/27/20 1135  TSH 0.465   Anemia Panel: No results for input(s): VITAMINB12, FOLATE, FERRITIN, TIBC, IRON, RETICCTPCT in the last 72 hours. Urine analysis:    Component Value Date/Time   COLORURINE YELLOW 05/27/2020 1122   APPEARANCEUR CLEAR 05/27/2020 1122   LABSPEC 1.021 05/27/2020 1122   PHURINE 5.0 05/27/2020 1122   GLUCOSEU NEGATIVE 05/27/2020 1122   HGBUR NEGATIVE 05/27/2020 1122   BILIRUBINUR NEGATIVE 05/27/2020 1122   KETONESUR NEGATIVE 05/27/2020 1122   PROTEINUR 30 (A) 05/27/2020 1122   NITRITE NEGATIVE 05/27/2020 1122   LEUKOCYTESUR NEGATIVE 05/27/2020 1122   Sepsis Labs: @LABRCNTIP (procalcitonin:4,lacticidven:4)  ) Recent Results (from the past 240 hour(s))  SARS Coronavirus 2 by RT PCR (hospital order, performed in  [facility] hospital lab) Nasopharyngeal Nasopharyngeal Swab     Status: None   Collection Time: 05/27/20  5:54 PM   Specimen: Nasopharyngeal Swab  Result Value Ref Range Status   SARS Coronavirus 2 NEGATIVE NEGATIVE Final    Comment: (NOTE) SARS-CoV-2 target nucleic acids are NOT DETECTED. The SARS-CoV-2 RNA is generally detectable in upper and lower respiratory specimens during the acute phase of infection. The lowest concentration of SARS-CoV-2 viral copies this assay can detect is 250 copies / mL. A negative result does not preclude SARS-CoV-2 infection and should not be used as the sole basis for treatment or other patient management decisions.  A negative result may occur with improper specimen collection / handling, submission of specimen other than nasopharyngeal swab, presence of viral mutation(s) within the areas targeted by this assay, and inadequate number of viral copies (<250 copies / mL). A negative result must be combined with clinical observations,  patient history, and epidemiological information. Fact Sheet for Patients:   BoilerBrush.com.cy Fact Sheet for Healthcare Providers: https://pope.com/ This test is not yet approved or cleared  by the [place] FDA and has been authorized for detection and/or diagnosis of SARS-CoV-2 by FDA under an Emergency Use Authorization (EUA).  This EUA will remain in effect (meaning this test can be used) for the duration of the COVID-19 declaration under Section 564(b)(1) of the Act, 21 U.S.C. section 360bbb-3(b)(1), unless the authorization is terminated or revoked sooner. Performed at [facility], [address], [place], [place] [address]   Urine Culture     Status: None   Collection Time: 05/27/20  5:55 PM   Specimen: Urine, Clean Catch  Result Value Ref Range Status   Specimen Description   Final    URINE, CLEAN CATCH Performed at [facility], [address],  [place], [place] [address]    Special Requests   Final    NONE Performed at [facility], [address] [place], [place] [address]    Culture   Final    NO GROWTH Performed at [facility], [address], [place], [place] [address]    Report Status 05/29/2020 FINAL  Final         Radiology Studies: CT Head Wo Contrast  Result Date: 05/27/2020 CLINICAL DATA:  Right leg weakness EXAM: CT HEAD WITHOUT CONTRAST TECHNIQUE: Contiguous axial images were obtained from the base of the skull through the vertex without intravenous contrast. COMPARISON:  None. FINDINGS: Brain: Mild atrophy. No hydrocephalus. Negative for acute infarct, hemorrhage, mass Vascular: Negative for hyperdense vessel Skull: Negative Sinuses/Orbits: Paranasal sinuses clear. Bilateral cataract extraction. Other: None IMPRESSION: Mild atrophy.  No acute abnormality Electronically Signed   By: Marlan Palau M.D.   On: 05/27/2020 17:12   MR BRAIN WO CONTRAST  Result Date: 05/27/2020 CLINICAL DATA:  Right leg weakness. EXAM: MRI HEAD WITHOUT CONTRAST TECHNIQUE: Multiplanar, multiecho pulse sequences of the brain and surrounding structures were obtained without intravenous contrast. COMPARISON:  CT head today FINDINGS: Brain: Motion degraded study. Negative for acute infarct. Generalized atrophy. Mild chronic microvascular ischemic change in the white matter. Negative for hemorrhage mass or fluid collection. Vascular: Normal arterial flow voids. Skull and upper cervical spine: Negative Sinuses/Orbits: Paranasal sinuses clear. Bilateral cataract extraction Other: None IMPRESSION: No acute abnormality Motion degraded study Atrophy with mild chronic microvascular ischemic change in the white matter. Electronically Signed   By: Marlan Palau M.D.   On: 05/27/2020 19:50   MR LUMBAR SPINE WO CONTRAST  Result Date: 05/27/2020 CLINICAL DATA:  Low back pain right leg weakness EXAM: MRI LUMBAR SPINE WITHOUT CONTRAST TECHNIQUE:  Multiplanar, multisequence MR imaging of the lumbar spine was performed. No intravenous contrast was administered. COMPARISON:  Lumbar radiographs 06/30/2013 FINDINGS: Segmentation:  Normal Alignment:  Moderate levoscoliosis in the lumbar spine 7 mm anterolisthesis L5-S1 similar to the prior study. Vertebrae: Negative for fracture or mass. Discogenic edema on the right at L1-2 and L2-3 related to scoliosis. Conus medullaris and cauda equina: Conus extends to the L1-2 level. Conus and cauda equina appear normal. Paraspinal and other soft tissues: No paraspinous mass or adenopathy. 2 cm left renal cyst. Disc levels: T12-L1 mild disc degeneration L1-2: Asymmetric disc degeneration and spurring on the right. Mild spinal stenosis. Moderate subarticular and foraminal stenosis on the right. L2-3: Asymmetric disc degeneration and spurring on the right with moderate subarticular and foraminal stenosis right greater than left. Moderate spinal stenosis L3-4: Disc  degeneration with disc bulging and endplate spurring. Mild subarticular stenosis bilaterally. Mild facet degeneration L4-5: Disc degeneration asymmetric on the left with spurring. Bilateral facet degeneration. Moderate subarticular stenosis on the left. L5-S1: 7 mm anterolisthesis. Possible pars defect on the left. No pars defect on the right but there is right-sided facet degeneration. CT would be helpful to further evaluate these changes. There is marked left foraminal encroachment with left L5 nerve root impingement. IMPRESSION: 1. Image quality degraded by moderate motion. 2. Moderate levoscoliosis lumbar spine. Multilevel degenerative changes throughout the lumbar spine with spinal and foraminal stenosis as described above. 3. Moderate spinal stenosis L2-3 with moderate subarticular and foraminal stenosis right greater than left 4. 7 mm anterolisthesis L5-S1 with possible pars defect on the left. Marked left foraminal encroachment with impingement left L5 nerve  root. 5. These results will be called to the ordering clinician or representative by the Radiologist Assistant, and communication documented in the PACS or [employer]. Electronically Signed   By: Marlan Palau M.D.   On: 05/27/2020 19:57   DG Chest Portable 1 View  Result Date: 05/27/2020 CLINICAL DATA:  Cough. Ex-smoker. EXAM: PORTABLE CHEST 1 VIEW COMPARISON:  03/13/2020 FINDINGS: Stable enlarged cardiac silhouette and elevated right hemidiaphragm. The pulmonary vasculature is mildly prominent with improvement. Mild diffuse peribronchial thickening and accentuation of the interstitial markings without significant change. Stable moderate scoliosis and diffuse osteopenia. IMPRESSION: 1. No acute abnormality. 2. Stable cardiomegaly and mild chronic bronchitic changes. 3. Stable elevated right hemidiaphragm. Electronically Signed   By: Beckie Salts M.D.   On: 05/27/2020 15:07        Scheduled Meds: . apixaban  5 mg Oral BID  . ascorbic acid  1,000 mg Oral Daily  . aspirin  324 mg Oral Once  . dexamethasone  2 mg Oral Q12H  . feeding supplement  1 Container Oral TID BM  . metoprolol succinate  12.5 mg Oral Daily  . omega-3 acid ethyl esters  1 g Oral BID  . pantoprazole  40 mg Oral Daily  . polyethylene glycol  17 g Oral BID  . rosuvastatin  10 mg Oral Daily  . tamsulosin  0.4 mg Oral QPC supper   Continuous Infusions:   LOS: 2 days    Time spent:  Zannie Cove, MD [facility] 05/29/2020, 12:13 PM

## 2020-05-29 NOTE — Progress Notes (Signed)
Placed patient on BIPAP for the night with IPAP set at 10cm and EPAP set at 5cm and oxygen set at 3lpm.  

## 2020-05-30 LAB — CBC
HCT: 40.5 % (ref 39.0–52.0)
Hemoglobin: 13 g/dL (ref 13.0–17.0)
MCH: 32.7 pg (ref 26.0–34.0)
MCHC: 32.1 g/dL (ref 30.0–36.0)
MCV: 102 fL — ABNORMAL HIGH (ref 80.0–100.0)
Platelets: 197 10*3/uL (ref 150–400)
RBC: 3.97 MIL/uL — ABNORMAL LOW (ref 4.22–5.81)
RDW: 14.5 % (ref 11.5–15.5)
WBC: 13.2 10*3/uL — ABNORMAL HIGH (ref 4.0–10.5)
nRBC: 0 % (ref 0.0–0.2)

## 2020-05-30 MED ORDER — DEXAMETHASONE 0.5 MG PO TABS
1.0000 mg | ORAL_TABLET | Freq: Every day | ORAL | Status: AC
  Administered 2020-05-31: 1 mg via ORAL
  Filled 2020-05-30: qty 2

## 2020-05-30 NOTE — Progress Notes (Signed)
PROGRESS NOTE    Jesus Fowler  WEX:[id number] DOB: 05-19-1933 DOA: 05/27/2020 PCP: [name], MD  Brief Narrative:Jesus Fowler  is a 85 y.o. male, with past medical history of A. fib, on Eliquis, hypertension, prostate disorder, recurrent cellulitis with chronic lower extremity changes, active sleep apnea on BiPAP (not CPAP), patient was brought by his daughter secondary to progressive weakness, patient was hospitalized last March, he was discharged to SNF, he was discharged back home before 2 days, patient has been ambulating with a walker, he is having PT and OT following at home, patient reports progressive weakness in lower extremities bilaterally, more pronounced over the last 4 to 5 days, he denies any other focal deficits, tingling, numbness, slurred speech or altered mental status. -in ED CT head with no acute findings, but patient was noticed to have 3/5 weakness in right lower extremity, patient had no significant lab abnormality.  Assessment & Plan:   Bilateral lower leg weakness Pain and radiculopathy -MRI LS spine notes significant lumbar spinal stenosis and radiculopathy at L5/S1 -Started on IV Decadron on admission, taper this off slowly -Tylenol, PRN Vicodin -Neurosurgery Dr. Conchita Paris consulted, recommend pain control, physical therapy and outpatient follow-up -PT OT eval completed, SNF recommended for rehab -Social work consulted, discharge planning, medically stable for rehab  Leukocytosis -Could be from steroids, afebrile, monitor  Chronic atrial fibrillation  -Continue metoprolol, heart rate controlled, resumed Eliquis  Hypertension,  -blood pressure on the low side -Lisinopril and HCTZ stopped  OSA -Continue with BiPAP at bedtime  Dyslipidemia -Continue with statin  GERD -Continue with PPI  Chronic venous stasis dermatitis of both lower extremities -Continue with skin care, unna boots as needed  DVT Prophylaxis : on Eliquis  Code  Status: Full code Family Communication: Discussed with patient in detail, no family at bedside, attempted to reach daughter yesterday finally able to speak to her today Disposition Plan:  Status is: Inpatient  Remains inpatient appropriate because: Needs SNF for rehab, unsafe discharge home alone   Dispo: The patient is from: Home              Anticipated d/c is to: SNF              Anticipated d/c date is: Whenever rehab bed is available              Patient currently is medically stable to d/c.  Consultants:   Neurosurgery   Procedures:   Antimicrobials:    Subjective: -Feels better, lower leg pain and weakness is starting to improve  Objective: Vitals:   05/29/20 2208 05/29/20 2234 05/30/20 0355 05/30/20 0824  BP: 129/73  (!) 111/93 99/66  Pulse: 82 85 69 74  Resp: 19 19 19 17   Temp: (!) 97.3 F (36.3 C)  98.1 F (36.7 C) 97.8 F (36.6 C)  TempSrc: Oral  Oral Oral  SpO2: 98% 93% 99% 99%  Weight:   89.3 kg   Height:        Intake/Output Summary (Last 24 hours) at 05/30/2020 1128 Last data filed at 05/30/2020 0355 Gross per 24 hour  Intake 720 ml  Output 1176 ml  Net -456 ml   Filed Weights   05/28/20 0550 05/29/20 0134 05/30/20 0355  Weight: 86.7 kg 89.2 kg 89.3 kg    Examination:  General exam: Chronically ill elderly male sitting up in bed, AAOx3, no distress HEENT: No JVD CVS: S1-S2, regular rhythm Lungs: Clear bilaterally Abdomen: Soft, nontender, bowel sounds present Extremities: No edema chronic  venous stasis changes Central nervous system: Bilateral leg weakness, right> left. Psychiatry: Mood and affect is appropriate    Data Reviewed:   CBC: Recent Labs  Lab 05/27/20 1135 05/28/20 0910 05/29/20 0645 05/30/20 0426  WBC 10.3 7.4 14.9* 13.2*  NEUTROABS 7.6  --   --   --   HGB 14.1 14.7 13.1 13.0  HCT 44.3 45.7 41.1 40.5  MCV 102.1* 101.1* 101.5* 102.0*  PLT 199 203 188 197   Basic Metabolic Panel: Recent Labs  Lab  05/27/20 1135 05/28/20 0910 05/29/20 0645  NA 141 143 137  K 3.5 4.5 4.2  CL 100 104 104  CO2 29 27 26   GLUCOSE 99 117* 152*  BUN 20 16 32*  CREATININE 0.89 0.94 0.86  CALCIUM 9.6 9.5 9.0  MG 2.0  --   --    GFR: Estimated Creatinine Clearance: 65.7 mL/min (by C-G formula based on SCr of 0.86 mg/dL). Liver Function Tests: Recent Labs  Lab 05/27/20 1135  AST 30  ALT 17  ALKPHOS 67  BILITOT 2.6*  PROT 7.0  ALBUMIN 3.2*   No results for input(s): LIPASE, AMYLASE in the last 168 hours. No results for input(s): AMMONIA in the last 168 hours. Coagulation Profile: No results for input(s): INR, PROTIME in the last 168 hours. Cardiac Enzymes: No results for input(s): CKTOTAL, CKMB, CKMBINDEX, TROPONINI in the last 168 hours. BNP (last 3 results) No results for input(s): PROBNP in the last 8760 hours. HbA1C: No results for input(s): HGBA1C in the last 72 hours. CBG: No results for input(s): GLUCAP in the last 168 hours. Lipid Profile: No results for input(s): CHOL, HDL, LDLCALC, TRIG, CHOLHDL, LDLDIRECT in the last 72 hours. Thyroid Function Tests: Recent Labs    05/27/20 1135  TSH 0.465   Anemia Panel: No results for input(s): VITAMINB12, FOLATE, FERRITIN, TIBC, IRON, RETICCTPCT in the last 72 hours. Urine analysis:    Component Value Date/Time   COLORURINE YELLOW 05/27/2020 1122   APPEARANCEUR CLEAR 05/27/2020 1122   LABSPEC 1.021 05/27/2020 1122   PHURINE 5.0 05/27/2020 1122   GLUCOSEU NEGATIVE 05/27/2020 1122   HGBUR NEGATIVE 05/27/2020 1122   BILIRUBINUR NEGATIVE 05/27/2020 1122   KETONESUR NEGATIVE 05/27/2020 1122   PROTEINUR 30 (A) 05/27/2020 1122   NITRITE NEGATIVE 05/27/2020 1122   LEUKOCYTESUR NEGATIVE 05/27/2020 1122   Sepsis Labs: @LABRCNTIP (procalcitonin:4,lacticidven:4)  ) Recent Results (from the past 240 hour(s))  SARS Coronavirus 2 by RT PCR (hospital order, performed in [facility] hospital lab) Nasopharyngeal Nasopharyngeal Swab      Status: None   Collection Time: 05/27/20  5:54 PM   Specimen: Nasopharyngeal Swab  Result Value Ref Range Status   SARS Coronavirus 2 NEGATIVE NEGATIVE Final    Comment: (NOTE) SARS-CoV-2 target nucleic acids are NOT DETECTED. The SARS-CoV-2 RNA is generally detectable in upper and lower respiratory specimens during the acute phase of infection. The lowest concentration of SARS-CoV-2 viral copies this assay can detect is 250 copies / mL. A negative result does not preclude SARS-CoV-2 infection and should not be used as the sole basis for treatment or other patient management decisions.  A negative result may occur with improper specimen collection / handling, submission of specimen other than nasopharyngeal swab, presence of viral mutation(s) within the areas targeted by this assay, and inadequate number of viral copies (<250 copies / mL). A negative result must be combined with clinical observations, patient history, and epidemiological information. Fact Sheet for Patients:   [facility] Fact Sheet for Healthcare  Providers: BankingDealers.co.za This test is not yet approved or cleared  by the [place] and has been authorized for detection and/or diagnosis of SARS-CoV-2 by FDA under an Emergency Use Authorization (EUA).  This EUA will remain in effect (meaning this test can be used) for the duration of the COVID-19 declaration under Section 564(b)(1) of the Act, 21 U.S.C. section 360bbb-3(b)(1), unless the authorization is terminated or revoked sooner. Performed at [facility], [address] [place], [place] [address]   Urine Culture     Status: None   Collection Time: 05/27/20  5:55 PM   Specimen: Urine, Clean Catch  Result Value Ref Range Status   Specimen Description   Final    URINE, CLEAN CATCH Performed at [facility], [address], [place], [place] [address]    Special Requests   Final    NONE Performed at  [facility], [address], [place], [place] [address]    Culture   Final    NO GROWTH Performed at [facility], [place] [address] [place], [place] [address]    Report Status 05/29/2020 FINAL  Final         Radiology Studies: No results found.      Scheduled Meds: . apixaban  5 mg Oral BID  . ascorbic acid  1,000 mg Oral Daily  . aspirin  324 mg Oral Once  . feeding supplement  1 Container Oral TID BM  . metoprolol succinate  12.5 mg Oral Daily  . omega-3 acid ethyl esters  1 g Oral BID  . pantoprazole  40 mg Oral Daily  . polyethylene glycol  17 g Oral BID  . rosuvastatin  10 mg Oral Daily  . tamsulosin  0.4 mg Oral QPC supper   Continuous Infusions:   LOS: 3 days    Time spent: 75min  Domenic Polite, MD [facility] 05/30/2020, 11:28 AM

## 2020-05-30 NOTE — Plan of Care (Signed)
  Problem: Health Behavior/Discharge Planning: Goal: Ability to manage health-related needs will improve Outcome: Progressing   

## 2020-05-30 NOTE — Evaluation (Signed)
Occupational Therapy Evaluation Patient Details Name: Jesus Fowler MRN: [id number] DOB: 01-27-1933 Today's Date: 05/30/2020    History of Present Illness Jesus Fowler  is a 86 y.o. male, with past medical history of A. fib, on Eliquis, hypertension, prostate disorder, recurrent cellulitis with chronic lower extremity changes, active sleep apnea on BiPAP (not CPAP), patient was brought by his daughter secondary to progressive weakness, patient was hospitalized last March, he was discharged to SNF, he was discharged back home before 2 days, patient has been ambulating with a walker, he is having PT and OT following at home, patient reports progressive weakness in lower extremities bilaterally, more pronounced over the last 4 to 5 days, he denies any other focal deficits, tingling, numbness, slurred speech or altered mental status.   Clinical Impression   Pt PTA: Pt living at home with family and recently d/c from SNF. Pt requiring assist with ADL and mobility. Pt admitted with above dx. Pt currently with functional limitiations due to the deficits in decreased activity tolerance, decreased ability to care for self and decreased strength. Pt minA overall for ADL and transfers. Pt requires cues to keep O2 on and fatigues very quickly.  Pt will benefit from skilled OT to increase their independence and safety with adls and balance to allow discharge to SNF to increase to PLOF. OT following acutely.   Pt with O2 on 3L >90%. HR and BP stable.       SNF;Supervision/Assistance - 24 hour    Equipment Recommendations  Other (comment)(defer to next facility)    Recommendations for Other Services       Precautions / Restrictions Precautions Precautions: Fall Restrictions Weight Bearing Restrictions: No      Mobility Bed Mobility Overal bed mobility: Needs Assistance Bed Mobility: Supine to Sit     Supine to sit: Min guard     General bed mobility comments: use of rail and increased  time  Transfers Overall transfer level: Needs assistance Equipment used: None Transfers: Sit to/from [employer] Sit to Stand: Min guard Stand pivot transfers: Min assist       General transfer comment: guidance for hand placement    Balance Overall balance assessment: Needs assistance Sitting-balance support: No upper extremity supported;Feet supported Sitting balance-Leahy Scale: Fair     Standing balance support: Bilateral upper extremity supported;During functional activity Standing balance-Leahy Scale: Poor Standing balance comment: relies on UE support on RW and external support for balance                           ADL either performed or assessed with clinical judgement   ADL Overall ADL's : Needs assistance/impaired Eating/Feeding: Modified independent;Sitting   Grooming: Modified independent;Sitting   Upper Body Bathing: Modified independent;Sitting   Lower Body Bathing: Minimal assistance;Sitting/lateral leans;Sit to/from stand   Upper Body Dressing : Set up;Sitting   Lower Body Dressing: Minimal assistance;Sitting/lateral leans;Sit to/from stand;Cueing for safety   Toilet Transfer: Min guard;Stand-pivot;BSC   Toileting- Clothing Manipulation and Hygiene: Minimal assistance;Sitting/lateral lean;Sit to/from stand;Cueing for safety       Functional mobility during ADLs: Minimal assistance;Cueing for safety General ADL Comments: Pt limited by decreased activity tolerance, decreased ability to care for self and decreased strength. Pt with O2 on 3L >90%. HR and BP stable.     Vision Baseline Vision/History: No visual deficits Patient Visual Report: No change from baseline Vision Assessment?: No apparent visual deficits     Perception  Praxis      Pertinent Vitals/Pain Pain Assessment: No/denies pain     Hand Dominance Right   Extremity/Trunk Assessment Upper Extremity Assessment Upper Extremity Assessment:  Generalized weakness   Lower Extremity Assessment Lower Extremity Assessment: Generalized weakness   Cervical / Trunk Assessment Cervical / Trunk Assessment: Normal   Communication Communication Communication: No difficulties   Cognition Arousal/Alertness: Awake/alert Behavior During Therapy: WFL for tasks assessed/performed Overall Cognitive Status: Within Functional Limits for tasks assessed                                 General Comments: pt following all commands   General Comments  Pt with O2 on 3L >90%. HR and BP stable.    Exercises     Shoulder Instructions      Home Living Family/patient expects to be discharged to:: Private residence Living Arrangements: Alone Available Help at Discharge: Family;Available PRN/intermittently Type of Home: House Home Access: Ramped entrance     Home Layout: One level     Bathroom Shower/Tub: Producer, television/film/video: Handicapped height     Home Equipment: Environmental consultant - 2 wheels;Cane - single point;Shower seat;Bedside commode;Wheelchair - [employer] - 4 wheels   Additional Comments: wife moved out with pts son after signing separation papers once pt came home after a recent Rehab stay. Daughter has been helping pt over last few weeks once wife left.       Prior Functioning/Environment Level of Independence: Needs assistance  Gait / Transfers Assistance Needed: walked with rollator in house Modif I  ADL's / Homemaking Assistance Needed: B/D sponge bath    Comments: Daughter came and checked onpt and would bring microwave meals         OT Problem List: Decreased strength;Decreased activity tolerance;Impaired balance (sitting and/or standing);Decreased safety awareness;Decreased knowledge of use of DME or AE;Cardiopulmonary status limiting activity;Increased edema      OT Treatment/Interventions: Self-care/ADL training;Therapeutic exercise;Energy conservation;DME and/or AE instruction;Therapeutic  activities;Patient/family education;Balance training    OT Goals(Current goals can be found in the care plan section) Acute Rehab OT Goals Patient Stated Goal: to go back to rehab and then home OT Goal Formulation: With patient Time For Goal Achievement: 06/13/20 Potential to Achieve Goals: Good ADL Goals Pt Will Perform Grooming: with supervision;standing Pt Will Transfer to Toilet: with supervision;ambulating;bedside commode Pt/caregiver will Perform Home Exercise Program: Increased strength;Both right and left upper extremity;With Supervision Additional ADL Goal #1: Pt will tolerate x5 mins of ADL tasks with SpO2 levels >90% with exertion and 0 rest breaks. Additional ADL Goal #2: Pt will state/utilize 3 energy conservation techniques in order to increase independence with ADL and mobility.  OT Frequency: Min 2X/week   Barriers to D/C: Decreased caregiver support          Co-evaluation              AM-PAC OT "6 Clicks" Daily Activity     Outcome Measure Help from another person eating meals?: None Help from another person taking care of personal grooming?: A Little Help from another person toileting, which includes using toliet, bedpan, or urinal?: A Little Help from another person bathing (including washing, rinsing, drying)?: A Little Help from another person to put on and taking off regular upper body clothing?: A Little Help from another person to put on and taking off regular lower body clothing?: A Little 6 Click Score: 19   End of Session  Equipment Utilized During Treatment: Oxygen Nurse Communication: Mobility status  Activity Tolerance: Patient tolerated treatment well Patient left: in chair;with call bell/phone within reach;with chair alarm set  OT Visit Diagnosis: Unsteadiness on feet (R26.81);Muscle weakness (generalized) (M62.81)                Time: 2703-5009 OT Time Calculation (min): 15 min Charges:  OT General Charges $OT Visit: 1 Visit OT  Evaluation $OT Eval Moderate Complexity: 1 Mod  Jefferey Pica, OTR/L Acute Rehabilitation Services Pager: [phone] Office: [phone]   Jossette Zirbel  C 05/30/2020, 4:05 PM

## 2020-05-31 DIAGNOSIS — R531 Weakness: Secondary | ICD-10-CM

## 2020-05-31 DIAGNOSIS — R5381 Other malaise: Secondary | ICD-10-CM

## 2020-05-31 LAB — GLUCOSE, CAPILLARY: Glucose-Capillary: 140 mg/dL — ABNORMAL HIGH (ref 70–99)

## 2020-05-31 LAB — SARS CORONAVIRUS 2 (TAT 6-24 HRS): SARS Coronavirus 2: NEGATIVE

## 2020-05-31 MED ORDER — APIXABAN 5 MG PO TABS: 5.0000 mg | ORAL_TABLET | Freq: Two times a day (BID) | ORAL | Status: AC

## 2020-05-31 MED ORDER — HYDROCODONE-ACETAMINOPHEN 5-325 MG PO TABS: 1.0000 | ORAL_TABLET | Freq: Four times a day (QID) | ORAL | 0 refills | Status: DC | PRN

## 2020-05-31 MED ORDER — WITCH HAZEL-GLYCERIN EX PADS
MEDICATED_PAD | CUTANEOUS | Status: DC | PRN
  Filled 2020-05-31: qty 100

## 2020-05-31 NOTE — Discharge Summary (Addendum)
Physician Discharge Summary  [name] QAS:[id number] DOB: July 25, 1933 DOA: 05/27/2020  PCP: [name], MD  Admit date: 05/27/2020 Discharge date: 05/31/2020  Time spent: 35 minutes  Recommendations for Outpatient Follow-up:  1. SNF for short-term rehabilitation 2. Neurosurgery Dr. Conchita Paris in 1 month 3. PCP in 1 week 4. Continue CPAP at bedtime   Discharge Diagnoses:  Bilateral lower extremity weakness Lumbar spinal stenosis Right leg pain and radiculopathy   Atrial fibrillation, chronic (HCC)   OSA on BiPAP   Venous stasis dermatitis of both lower extremities   Anticoagulated for Afib   Dyslipidemia   Benign prostatic hyperplasia (BPH) with urinary urgency   Right leg weakness   Lumbar radiculopathy   General weakness   Physical deconditioning   Discharge Condition: Stable  Diet recommendation: Low-sodium, heart healthy  Filed Weights   05/29/20 0134 05/30/20 0355 05/31/20 0628  Weight: 89.2 kg 89.3 kg 89.5 kg    History of present illness:  MauriceScruggsis a86 y.o.male,with past medical history of A. fib, on Eliquis, hypertension, prostate disorder, recurrent cellulitis with chronic lower extremity changes, active sleep apnea on  CPAP, patient was brought by his daughter secondary to progressive weakness, patient was hospitalized last March, he was discharged to SNF, he was discharged back home before 2 days, patient has been ambulating with a walker, he is having PT and OT following at home, patient reports progressive weakness in lower extremities bilaterally, more pronounced over the last 4 to 5 days, he denies any other focal deficits, tingling, numbness, slurred speech or altered mental status. -in EDCT head with no acute findings,but patient was noticed to have 3/5 weakness in right lower extremity, patient had no significant lab abnormality.   Hospital Course:   Bilateral lower leg weakness Pain and radiculopathy -MRI LS spine notes  significant lumbar spinal stenosis and radiculopathy at L5/S1 -Started on low-dose IV Decadron on admission,  gradually tapered -Neurosurgery Dr. Conchita Paris consulted, recommend pain control, physical therapy and outpatient follow-up -We used Tylenol, started on low-dose gabapentin at bedtime and Vicodin as needed -PT OT eval completed, SNF recommended for rehab -Follow-up with Dr. Conchita Paris in 1 month  Leukocytosis -Secondary to steroids, improving, afebrile   Chronic atrial fibrillation  -Continue metoprolol, heart rate controlled, resumed Eliquis, dose increased to 5 mg twice daily per pharmacy recommendations since he did not meet criteria for lower dose  Hypertension,  -blood pressure on the low side -Lisinopril and HCTZ stopped  OSA -Continue with CPAP at bedtime  Dyslipidemia -Continue with statin  GERD -Continue with PPI  Chronic venous stasis dermatitis of both lower extremities -Continue with skin care, unna boots as needed   Consultations:  Neurosurgery Dr. Conchita Paris  Discharge Exam: Vitals:   05/31/20 0454 05/31/20 0833  BP: 102/62 112/68  Pulse: 83 83  Resp:    Temp: (!) 97.5 F (36.4 C) 97.9 F (36.6 C)  SpO2: 100% 94%    General: Awake alert oriented x2 Cardiovascular: S1-S2, regular rate rhythm Respiratory: Clear  Discharge Instructions   Discharge Instructions    Diet - low sodium heart healthy   Complete by: As directed    Increase activity slowly   Complete by: As directed      Allergies as of 05/31/2020   No Known Allergies     Medication List    STOP taking these medications   linaclotide 145 MCG Caps capsule Commonly known as: LINZESS   lisinopril-hydrochlorothiazide 20-12.5 MG tablet Commonly known as: ZESTORETIC   potassium chloride 10 MEQ  tablet Commonly known as: KLOR-CON   torsemide 20 MG tablet Commonly known as: Demadex     TAKE these medications   acetaminophen 325 MG tablet Commonly known as:  TYLENOL Take 2 tablets (650 mg total) by mouth every 6 (six) hours as needed for mild pain, fever or headache (or Fever >/= 101).   albuterol 108 (90 Base) MCG/ACT inhaler Commonly known as: VENTOLIN HFA Inhale 2 puffs into the lungs every 6 (six) hours as needed for wheezing or shortness of breath.   apixaban 5 MG Tabs tablet Commonly known as: ELIQUIS Take 1 tablet (5 mg total) by mouth 2 (two) times daily. What changed:   medication strength  how much to take   ascorbic acid 500 MG tablet Commonly known as: VITAMIN C Take 1,000 mg by mouth daily.   feeding supplement (ENSURE ENLIVE) Liqd Take 237 mLs by mouth 2 (two) times daily between meals.   FISH OIL BURP-LESS PO Take 2 capsules by mouth daily.   gabapentin 100 MG capsule Commonly known as: NEURONTIN Take 1 capsule (100 mg total) by mouth at bedtime.   HYDROcodone-acetaminophen 5-325 MG tablet Commonly known as: NORCO/VICODIN Take 1 tablet by mouth every 6 (six) hours as needed for moderate pain. What changed: when to take this   metoprolol succinate 25 MG 24 hr tablet Commonly known as: TOPROL-XL Take 0.5 tablets (12.5 mg total) by mouth daily.   NON FORMULARY May use CPAP from home with home settings. At Bedtime   pantoprazole 40 MG tablet Commonly known as: PROTONIX Take 1 tablet (40 mg total) by mouth daily.   polyethylene glycol 17 g packet Commonly known as: MIRALAX / GLYCOLAX Take 17 g by mouth 2 (two) times daily.   rosuvastatin 10 MG tablet Commonly known as: CRESTOR Take 1 tablet (10 mg total) by mouth in the morning.   senna-docusate 8.6-50 MG tablet Commonly known as: Senokot-S Take 2 tablets by mouth at bedtime. What changed:   when to take this  reasons to take this   sodium chloride 0.65 % Soln nasal spray Commonly known as: OCEAN Place 2 sprays into both nostrils 3 (three) times daily. What changed:   when to take this  reasons to take this   tamsulosin 0.4 MG Caps  capsule Commonly known as: FLOMAX Take 1 capsule (0.4 mg total) by mouth daily after supper.   Vitamin D3 125 MCG (5000 UT) Caps Take 1 capsule (5,000 Units total) by mouth daily. What changed: how much to take      No Known Allergies Follow-up Information    [name], MD. Schedule an appointment as soon as possible for a visit in 1 week(s).   Specialty: Internal Medicine Contact information: [address] [place] [address] [phone]        Lisbeth Renshaw, MD. Schedule an appointment as soon as possible for a visit in 1 month(s).   Specialty: Neurosurgery Contact information: [address] [place] [address] [phone]            The results of significant diagnostics from this hospitalization (including imaging, microbiology, ancillary and laboratory) are listed below for reference.    Significant Diagnostic Studies: CT Head Wo Contrast  Result Date: 05/27/2020 CLINICAL DATA:  Right leg weakness EXAM: CT HEAD WITHOUT CONTRAST TECHNIQUE: Contiguous axial images were obtained from the base of the skull through the vertex without intravenous contrast. COMPARISON:  None. FINDINGS: Brain: Mild atrophy. No hydrocephalus. Negative for acute infarct,  hemorrhage, mass Vascular: Negative for hyperdense vessel Skull: Negative Sinuses/Orbits: Paranasal sinuses clear. Bilateral cataract extraction. Other: None IMPRESSION: Mild atrophy.  No acute abnormality Electronically Signed   By: Franchot Gallo M.D.   On: 05/27/2020 17:12   MR BRAIN WO CONTRAST  Result Date: 05/27/2020 CLINICAL DATA:  Right leg weakness. EXAM: MRI HEAD WITHOUT CONTRAST TECHNIQUE: Multiplanar, multiecho pulse sequences of the brain and surrounding structures were obtained without intravenous contrast. COMPARISON:  CT head today FINDINGS: Brain: Motion degraded study. Negative for acute infarct. Generalized atrophy. Mild chronic microvascular ischemic change in the  white matter. Negative for hemorrhage mass or fluid collection. Vascular: Normal arterial flow voids. Skull and upper cervical spine: Negative Sinuses/Orbits: Paranasal sinuses clear. Bilateral cataract extraction Other: None IMPRESSION: No acute abnormality Motion degraded study Atrophy with mild chronic microvascular ischemic change in the white matter. Electronically Signed   By: Franchot Gallo M.D.   On: 05/27/2020 19:50   MR LUMBAR SPINE WO CONTRAST  Result Date: 05/27/2020 CLINICAL DATA:  Low back pain right leg weakness EXAM: MRI LUMBAR SPINE WITHOUT CONTRAST TECHNIQUE: Multiplanar, multisequence MR imaging of the lumbar spine was performed. No intravenous contrast was administered. COMPARISON:  Lumbar radiographs 06/30/2013 FINDINGS: Segmentation:  Normal Alignment:  Moderate levoscoliosis in the lumbar spine 7 mm anterolisthesis L5-S1 similar to the prior study. Vertebrae: Negative for fracture or mass. Discogenic edema on the right at L1-2 and L2-3 related to scoliosis. Conus medullaris and cauda equina: Conus extends to the L1-2 level. Conus and cauda equina appear normal. Paraspinal and other soft tissues: No paraspinous mass or adenopathy. 2 cm left renal cyst. Disc levels: T12-L1 mild disc degeneration L1-2: Asymmetric disc degeneration and spurring on the right. Mild spinal stenosis. Moderate subarticular and foraminal stenosis on the right. L2-3: Asymmetric disc degeneration and spurring on the right with moderate subarticular and foraminal stenosis right greater than left. Moderate spinal stenosis L3-4: Disc degeneration with disc bulging and endplate spurring. Mild subarticular stenosis bilaterally. Mild facet degeneration L4-5: Disc degeneration asymmetric on the left with spurring. Bilateral facet degeneration. Moderate subarticular stenosis on the left. L5-S1: 7 mm anterolisthesis. Possible pars defect on the left. No pars defect on the right but there is right-sided facet degeneration. CT  would be helpful to further evaluate these changes. There is marked left foraminal encroachment with left L5 nerve root impingement. IMPRESSION: 1. Image quality degraded by moderate motion. 2. Moderate levoscoliosis lumbar spine. Multilevel degenerative changes throughout the lumbar spine with spinal and foraminal stenosis as described above. 3. Moderate spinal stenosis L2-3 with moderate subarticular and foraminal stenosis right greater than left 4. 7 mm anterolisthesis L5-S1 with possible pars defect on the left. Marked left foraminal encroachment with impingement left L5 nerve root. 5. These results will be called to the ordering clinician or representative by the Radiologist Assistant, and communication documented in the PACS or [employer]. Electronically Signed   By: Franchot Gallo M.D.   On: 05/27/2020 19:57   DG Chest Portable 1 View  Result Date: 05/27/2020 CLINICAL DATA:  Cough. Ex-smoker. EXAM: PORTABLE CHEST 1 VIEW COMPARISON:  03/13/2020 FINDINGS: Stable enlarged cardiac silhouette and elevated right hemidiaphragm. The pulmonary vasculature is mildly prominent with improvement. Mild diffuse peribronchial thickening and accentuation of the interstitial markings without significant change. Stable moderate scoliosis and diffuse osteopenia. IMPRESSION: 1. No acute abnormality. 2. Stable cardiomegaly and mild chronic bronchitic changes. 3. Stable elevated right hemidiaphragm. Electronically Signed   By: Claudie Revering M.D.   On: 05/27/2020  15:07    Microbiology: Recent Results (from the past 240 hour(s))  SARS Coronavirus 2 by RT PCR (hospital order, performed in [facility] hospital lab) Nasopharyngeal Nasopharyngeal Swab     Status: None   Collection Time: 05/27/20  5:54 PM   Specimen: Nasopharyngeal Swab  Result Value Ref Range Status   SARS Coronavirus 2 NEGATIVE NEGATIVE Final    Comment: (NOTE) SARS-CoV-2 target nucleic acids are NOT DETECTED. The SARS-CoV-2 RNA is generally  detectable in upper and lower respiratory specimens during the acute phase of infection. The lowest concentration of SARS-CoV-2 viral copies this assay can detect is 250 copies / mL. A negative result does not preclude SARS-CoV-2 infection and should not be used as the sole basis for treatment or other patient management decisions.  A negative result may occur with improper specimen collection / handling, submission of specimen other than nasopharyngeal swab, presence of viral mutation(s) within the areas targeted by this assay, and inadequate number of viral copies (<250 copies / mL). A negative result must be combined with clinical observations, patient history, and epidemiological information. Fact Sheet for Patients:   BoilerBrush.com.cy Fact Sheet for Healthcare Providers: https://pope.com/ This test is not yet approved or cleared  by the [place] FDA and has been authorized for detection and/or diagnosis of SARS-CoV-2 by FDA under an Emergency Use Authorization (EUA).  This EUA will remain in effect (meaning this test can be used) for the duration of the COVID-19 declaration under Section 564(b)(1) of the Act, 21 U.S.C. section 360bbb-3(b)(1), unless the authorization is terminated or revoked sooner. Performed at [facility], [address], [place], [place] [address]   Urine Culture     Status: None   Collection Time: 05/27/20  5:55 PM   Specimen: Urine, Clean Catch  Result Value Ref Range Status   Specimen Description   Final    URINE, CLEAN CATCH Performed at [facility], [address], [place], [place] [address]    Special Requests   Final    NONE Performed at [facility], [address], [place], [place] [address]    Culture   Final    NO GROWTH Performed at [facility], [address], [place], [place] [address]    Report Status 05/29/2020 FINAL  Final     Labs: Basic Metabolic Panel: Recent Labs   Lab 05/27/20 1135 05/28/20 0910 05/29/20 0645  NA 141 143 137  K 3.5 4.5 4.2  CL 100 104 104  CO2 29 27 26   GLUCOSE 99 117* 152*  BUN 20 16 32*  CREATININE 0.89 0.94 0.86  CALCIUM 9.6 9.5 9.0  MG 2.0  --   --    Liver Function Tests: Recent Labs  Lab 05/27/20 1135  AST 30  ALT 17  ALKPHOS 67  BILITOT 2.6*  PROT 7.0  ALBUMIN 3.2*   No results for input(s): LIPASE, AMYLASE in the last 168 hours. No results for input(s): AMMONIA in the last 168 hours. CBC: Recent Labs  Lab 05/27/20 1135 05/28/20 0910 05/29/20 0645 05/30/20 0426  WBC 10.3 7.4 14.9* 13.2*  NEUTROABS 7.6  --   --   --   HGB 14.1 14.7 13.1 13.0  HCT 44.3 45.7 41.1 40.5  MCV 102.1* 101.1* 101.5* 102.0*  PLT 199 203 188 197   Cardiac Enzymes: No results for input(s): CKTOTAL, CKMB, CKMBINDEX, TROPONINI in the last 168 hours. BNP: BNP (last 3 results) Recent Labs    03/08/20 1535 05/27/20 1135  BNP 280.0* 281.0*    ProBNP (last 3 results) No results for input(s): PROBNP in the last 8760 hours.  CBG: No results for input(s): GLUCAP in the last 168 hours.  Signed:  Zannie CovePreetha Camira Geidel MD.  [facility] 05/31/2020, 10:51 AM

## 2020-05-31 NOTE — TOC Progression Note (Addendum)
Transition of Care [facility]) - Progression Note    Patient Details  Name: Jesus Fowler MRN: [id number] Date of Birth: 1933-05-04  Transition of Care [facility]) CM/SW Contact  Gildardo Griffes, [place] Phone Number: 05/31/2020, 9:11 AM  Clinical Narrative:     CSW lvm with Lynnea Ferrier at [facility] to see if they are able to accept patient as they are preference. Pending call back at this time.  CSW has initiate [facility] insurance authorization and faxed all clinicals. Reference number is : [id number].  Pending insurance auth at this time. Requested updated covid test be ordered by MD.   Expected Discharge Plan: Skilled Nursing Facility Barriers to Discharge: SNF Pending bed offer, Insurance Authorization  Expected Discharge Plan and Services Expected Discharge Plan: Skilled Nursing Facility       Living arrangements for the past 2 months: Single Family Home                                       Social Determinants of Health (SDOH) Interventions    Readmission Risk Interventions Readmission Risk Prevention Plan 03/11/2020  Medication Screening Complete  Transportation Screening Complete  Some recent data might be hidden

## 2020-05-31 NOTE — Care Management Important Message (Signed)
Important Message  Patient Details  Name: Jesus Fowler MRN: [id number] Date of Birth: [birth year]   Medicare Important Message Given:  Yes     Renie Ora 05/31/2020, 12:36 PM

## 2020-05-31 NOTE — Progress Notes (Signed)
Physical Therapy Treatment Patient Details Name: Jesus Fowler MRN: [id number] DOB: [birth year] Today's Date: 05/31/2020    History of Present Illness Jesus Fowler  is a 86 y.o. male, with past medical history of A. fib, on Eliquis, hypertension, prostate disorder, recurrent cellulitis with chronic lower extremity changes, active sleep apnea on BiPAP (not CPAP), patient was brought by his daughter secondary to progressive weakness, patient was hospitalized last March, he was discharged to SNF, he was discharged back home before 2 days, patient has been ambulating with a walker, he is having PT and OT following at home, patient reports progressive weakness in lower extremities bilaterally, more pronounced over the last 4 to 5 days, he denies any other focal deficits, tingling, numbness, slurred speech or altered mental status.    PT Comments    Pt admitted with above diagnosis. Pt was able to ambulate with RW with min guard to min assist needing min assist as he fatigued.  Pt slows as he fatigues.   Pt currently with functional limitations due to balance and endurance deficits. Pt will benefit from skilled PT to increase their independence and safety with mobility to allow discharge to the venue listed below.     Follow Up Recommendations  SNF;Supervision/Assistance - 24 hour     Equipment Recommendations  None recommended by PT    Recommendations for Other Services       Precautions / Restrictions Precautions Precautions: Fall Restrictions Weight Bearing Restrictions: No    Mobility  Bed Mobility               General bed mobility comments: in chair on arrival   Transfers Overall transfer level: Needs assistance Equipment used: Rolling walker (2 wheeled) Transfers: Sit to/from [employer] Transfers Sit to Stand: Min assist         General transfer comment: guidance for hand placement and a little assist to power up  Ambulation/Gait Ambulation/Gait  assistance: Min assist;Min guard Gait Distance (Feet): 110 Feet Assistive device: Rolling walker (2 wheeled) Gait Pattern/deviations: Step-through pattern;Decreased stride length;Wide base of support;Trunk flexed;Drifts right/left;Antalgic   Gait velocity interpretation: <1.31 ft/sec, indicative of household ambulator General Gait Details: Pt able to ambulate in hallway with RW.  Pt with flexed posture. Definite fatigue and pt with unsteeady LEs when fatigues.  Pt slowed up when fatigued. Stopped at 3N1 on way back to recliner and left on 3N1 with NT aware with the call bell in reach   Stairs             Wheelchair Mobility    Modified Rankin (Stroke Patients Only)       Balance Overall balance assessment: Needs assistance Sitting-balance support: No upper extremity supported;Feet supported Sitting balance-Leahy Scale: Fair     Standing balance support: Bilateral upper extremity supported;During functional activity Standing balance-Leahy Scale: Poor Standing balance comment: relies on UE support on RW and external support for balance                            Cognition Arousal/Alertness: Awake/alert Behavior During Therapy: WFL for tasks assessed/performed Overall Cognitive Status: Within Functional Limits for tasks assessed                                 General Comments: pt following all commands      Exercises General Exercises - Lower Extremity Long Arc Quad: AROM;Both;10 reps;Seated  General Comments General comments (skin integrity, edema, etc.): Pt on RA with O2 sats >90%.       Pertinent Vitals/Pain Pain Assessment: No/denies pain    Home Living                      Prior Function            PT Goals (current goals can now be found in the care plan section) Acute Rehab PT Goals Patient Stated Goal: to go back to rehab and then home Progress towards PT goals: Progressing toward goals    Frequency    Min  3X/week      PT Plan Current plan remains appropriate    Co-evaluation              AM-PAC PT "6 Clicks" Mobility   Outcome Measure  Help needed turning from your back to your side while in a flat bed without using bedrails?: A Lot Help needed moving from lying on your back to sitting on the side of a flat bed without using bedrails?: A Lot Help needed moving to and from a bed to a chair (including a wheelchair)?: A Lot Help needed standing up from a chair using your arms (e.g., wheelchair or bedside chair)?: A Little Help needed to walk in hospital room?: A Little Help needed climbing 3-5 steps with a railing? : A Lot 6 Click Score: 14    End of Session Equipment Utilized During Treatment: Gait belt Activity Tolerance: Patient limited by fatigue Patient left: in chair;with call bell/phone within reach;with chair alarm set Nurse Communication: Mobility status PT Visit Diagnosis: Unsteadiness on feet (R26.81);Other abnormalities of gait and mobility (R26.89);Muscle weakness (generalized) (M62.81)     Time: 9604-5409 PT Time Calculation (min) (ACUTE ONLY): 11 min  Charges:  $Gait Training: 8-22 mins                     Jesus Fowler,PT Acute Rehabilitation Services Pager:  [phone]  Office:  [phone]     Jesus Fowler 05/31/2020, 11:53 AM

## 2020-05-31 NOTE — Discharge Instructions (Signed)

## 2020-06-01 ENCOUNTER — Inpatient Hospital Stay
Admission: RE | Admit: 2020-06-01 | Discharge: 2021-08-31 | Disposition: E | Payer: Medicare Other | Source: Ambulatory Visit | Attending: Internal Medicine | Admitting: Internal Medicine

## 2020-06-01 DIAGNOSIS — I482 Chronic atrial fibrillation, unspecified: Secondary | ICD-10-CM

## 2020-06-01 DIAGNOSIS — R0989 Other specified symptoms and signs involving the circulatory and respiratory systems: Principal | ICD-10-CM

## 2020-06-01 DIAGNOSIS — R0689 Other abnormalities of breathing: Secondary | ICD-10-CM

## 2020-06-01 DIAGNOSIS — R600 Localized edema: Secondary | ICD-10-CM

## 2020-06-01 DIAGNOSIS — M5416 Radiculopathy, lumbar region: Secondary | ICD-10-CM

## 2020-06-01 DIAGNOSIS — R609 Edema, unspecified: Secondary | ICD-10-CM

## 2020-06-01 LAB — GLUCOSE, CAPILLARY: Glucose-Capillary: 90 mg/dL (ref 70–99)

## 2020-06-01 NOTE — Progress Notes (Signed)
Occupational Therapy Treatment Patient Details Name: Jesus Fowler MRN: [id number] DOB: January 25, 1933 Today's Date: 06-08-2020    History of present illness Jesus Fowler  is a 86 y.o. male, with past medical history of A. fib, on Eliquis, hypertension, prostate disorder, recurrent cellulitis with chronic lower extremity changes, active sleep apnea on BiPAP (not CPAP), patient was brought by his daughter secondary to progressive weakness, patient was hospitalized last March, he was discharged to SNF, he was discharged back home before 2 days, patient has been ambulating with a walker, he is having PT and OT following at home, patient reports progressive weakness in lower extremities bilaterally, more pronounced over the last 4 to 5 days, he denies any other focal deficits, tingling, numbness, slurred speech or altered mental status.   OT comments  Patient up in chair and initially needing some encouragement to participate as he said he was feeling too weak.  Patient able to stand with min guard and walk to sink with min guard, though needing a seated rest after.  Patient had some shortness of breath though he states that is normal for him, SpO2>92 throughout.  He was able to then stand at sink for 15 min and complete grooming with supervision, needing to rest forearms on counter for support.  Patient has low vision which makes fine motor tasks more difficult.  Will continue to follow with OT acutely to address the deficits listed below.    Follow Up Recommendations  SNF;Supervision/Assistance - 24 hour    Equipment Recommendations  Other (comment)(defer to next venue)    Recommendations for Other Services      Precautions / Restrictions Precautions Precautions: Fall Restrictions Weight Bearing Restrictions: No       Mobility Bed Mobility               General bed mobility comments: in chair on arrival   Transfers Overall transfer level: Needs assistance Equipment used:  Rolling walker (2 wheeled) Transfers: Sit to/from [employer] Sit to Stand: Min guard Stand pivot transfers: Min assist            Balance Overall balance assessment: Needs assistance Sitting-balance support: No upper extremity supported;Feet supported Sitting balance-Leahy Scale: Fair     Standing balance support: Bilateral upper extremity supported;During functional activity Standing balance-Leahy Scale: Poor                             ADL either performed or assessed with clinical judgement   ADL Overall ADL's : Needs assistance/impaired     Grooming: Supervision/safety;Standing;Wash/dry hands;Wash/dry face;Oral care                   Toilet Transfer: Min guard;Stand-pivot;BSC           Functional mobility during ADLs: Min guard       Vision   Vision Assessment?: Vision impaired- to be further tested in functional context Additional Comments: Baseline low vision   Perception     Praxis      Cognition Arousal/Alertness: Awake/alert Behavior During Therapy: WFL for tasks assessed/performed Overall Cognitive Status: Within Functional Limits for tasks assessed                                 General Comments: pt following all commands        Exercises     Shoulder Instructions  General Comments Patient on RA, SpO2 92-96 throughout session.     Pertinent Vitals/ Pain       Pain Assessment: Faces Faces Pain Scale: Hurts a little bit Pain Location: R leg Pain Descriptors / Indicators: Aching Pain Intervention(s): Monitored during session;Repositioned  Home Living                                          Prior Functioning/Environment              Frequency  Min 2X/week        Progress Toward Goals  OT Goals(current goals can now be found in the care plan section)  Progress towards OT goals: Progressing toward goals  Acute Rehab OT Goals Patient Stated Goal:  to go back to rehab and then home OT Goal Formulation: With patient Time For Goal Achievement: 06/13/20 Potential to Achieve Goals: Good  Plan Discharge plan remains appropriate    Co-evaluation                 AM-PAC OT "6 Clicks" Daily Activity     Outcome Measure   Help from another person eating meals?: None Help from another person taking care of personal grooming?: A Little Help from another person toileting, which includes using toliet, bedpan, or urinal?: A Little Help from another person bathing (including washing, rinsing, drying)?: A Little Help from another person to put on and taking off regular upper body clothing?: A Little Help from another person to put on and taking off regular lower body clothing?: A Little 6 Click Score: 19    End of Session Equipment Utilized During Treatment: Rolling walker;Gait belt  OT Visit Diagnosis: Unsteadiness on feet (R26.81);Muscle weakness (generalized) (M62.81)   Activity Tolerance Patient tolerated treatment well   Patient Left in chair;with call bell/phone within reach;with chair alarm set   Nurse Communication Mobility status        Time: 1287-8676 OT Time Calculation (min): 53 min  Charges: OT General Charges $OT Visit: 1 Visit OT Treatments $Self Care/Home Management : 38-52 mins $Therapeutic Activity: 8-22 mins   Jesus Fowler, OTR/L  Jesus Fowler 06/03/2020, 2:31 PM

## 2020-06-01 NOTE — Progress Notes (Signed)
Leukocytosis: Likely due to steroids, improving.  Chronic atrial fibrillation: [facility] PROGRESS NOTE    Progress Note  [name]  KGM:[id number] DOB: 12-02-1933 DOA: 05/27/2020 PCP: Lucia Gaskins, MD     Brief Narrative:   Jesus Fowler is an 86 y.o. male with past medical history of A. fib, on Eliquis, hypertension, prostate disorder, recurrent cellulitis with chronic lower extremity changes, active sleep apnea on  CPAP, patient was brought by his daughter secondary to progressive weakness, patient was hospitalized last March, he was discharged to SNF, he was discharged back home before 2 days, patient has been ambulating with a walker, he is having PT and OT following at home, patient reports progressive weakness in lower extremities bilaterally, more pronounced over the last 4 to 5 days, he denies any other focal deficits, tingling, numbness, slurred speech or altered mental status.  Assessment/Plan:   Bilateral lower extremity weakness secondary to pain and reactive neuropathy: MRI shows significant lumbar stenosis at L4-L5 neurosurgery was consulted recommended pain control physical therapy conservative management and outpatient follow-up. Physical therapy evaluated the patient recommended skilled nursing facility he will follow-up with Dr. Kathyrn Sheriff in 1 month. Patient is medically stable for transfer there has been no changes overnight.   Leukocytosis: Secondary to steroids he remained afebrile improved.  Chronic atrial fibrillation: Currently rate controlled continue metoprolol and Eliquis.  Essential hypertension: Antihypertensive medication lisinopril and hydrochlorothiazide were held, he will very reevaluated at skilled nursing facility.  Obstructive sleep apnea: Continue CPAP at home.  Dyslipidemia: Continue statins.     RN Pressure Injury Documentation: Pressure Injury 03/15/20 Heel Right Stage 2 -  Partial thickness loss of dermis  presenting as a shallow open injury with a red, pink wound bed without slough. open area to right heel (Active)  03/15/20 0000  Location: Heel  Location Orientation: Right  Staging: Stage 2 -  Partial thickness loss of dermis presenting as a shallow open injury with a red, pink wound bed without slough.  Wound Description (Comments): open area to right heel  Present on Admission: Yes     DVT prophylaxis: eliquis Family Communication:none Status is: Inpatient  Remains inpatient appropriate because:Patient is medically stable for discharge no changes overnight.   Dispo: The patient is from: Home              Anticipated d/c is to: SNF              Anticipated d/c date is: 1 day              Patient currently is medically stable to d/c.         Code Status:     Code Status Orders  (From admission, onward)         Start     Ordered   05/28/20 0017  Full code  Continuous     05/28/20 0017        Code Status History    Date Active Date Inactive Code Status Order ID Comments User Context   03/14/2020 2147 03/18/2020 2229 Full Code [id number]  Neena Rhymes, MD ED   03/08/2020 2104 03/14/2020 1451 Full Code [id number]  Bethena Roys, MD Inpatient   Advance Care Planning Activity    Advance Directive Documentation     Most Recent Value  Type of Advance Directive  Healthcare Power of Attorney  Pre-existing out of facility DNR order (yellow form or pink MOST form)  --  "MOST" Form in Place?  --  IV Access:    Peripheral IV   Procedures and diagnostic studies:   No results found.   Medical Consultants:    None.  Anti-Infectives:   none  Subjective:    [place] no complaints feels great.  Objective:    Vitals:   05/31/20 1340 05/31/20 2015 06/06/20 0451 Jun 06, 2020 0501  BP: 114/73 111/63 96/67   Pulse: 89 78 78   Resp: 20 20 20    Temp: 97.7 F (36.5 C) 97.8 F (36.6 C) 97.8 F (36.6 C)   TempSrc: Oral Oral Oral    SpO2: 93% 95% 99%   Weight:    89.2 kg  Height:       SpO2: 99 % O2 Flow Rate (L/min): 3 L/min   Intake/Output Summary (Last 24 hours) at June 06, 2020 1113 Last data filed at 06/06/20 0900 Gross per 24 hour  Intake 1300 ml  Output 1775 ml  Net -475 ml   Filed Weights   05/30/20 0355 05/31/20 0628 06/06/2020 0501  Weight: 89.3 kg 89.5 kg 89.2 kg    Exam: General exam: In no acute distress. Respiratory system: Good air movement and clear to auscultation. Cardiovascular system: S1 & S2 heard, RRR. No JVD. Gastrointestinal system: Abdomen is nondistended, soft and nontender.  Extremities: No pedal edema. Skin: No rashes, lesions or ulcers Data Reviewed:    Labs: Basic Metabolic Panel: Recent Labs  Lab 05/27/20 1135 05/27/20 1135 05/28/20 0910 05/29/20 0645  NA 141  --  143 137  K 3.5   < > 4.5 4.2  CL 100  --  104 104  CO2 29  --  27 26  GLUCOSE 99  --  117* 152*  BUN 20  --  16 32*  CREATININE 0.89  --  0.94 0.86  CALCIUM 9.6  --  9.5 9.0  MG 2.0  --   --   --    < > = values in this interval not displayed.   GFR Estimated Creatinine Clearance: 65.7 mL/min (by C-G formula based on SCr of 0.86 mg/dL). Liver Function Tests: Recent Labs  Lab 05/27/20 1135  AST 30  ALT 17  ALKPHOS 67  BILITOT 2.6*  PROT 7.0  ALBUMIN 3.2*   No results for input(s): LIPASE, AMYLASE in the last 168 hours. No results for input(s): AMMONIA in the last 168 hours. Coagulation profile No results for input(s): INR, PROTIME in the last 168 hours. COVID-19 Labs  No results for input(s): DDIMER, FERRITIN, LDH, CRP in the last 72 hours.  Lab Results  Component Value Date   SARSCOV2NAA NEGATIVE 05/31/2020   SARSCOV2NAA NEGATIVE 05/27/2020   SARSCOV2NAA NEGATIVE 03/18/2020   SARSCOV2NAA NEGATIVE 03/14/2020    CBC: Recent Labs  Lab 05/27/20 1135 05/28/20 0910 05/29/20 0645 05/30/20 0426  WBC 10.3 7.4 14.9* 13.2*  NEUTROABS 7.6  --   --   --   HGB 14.1 14.7 13.1 13.0  HCT  44.3 45.7 41.1 40.5  MCV 102.1* 101.1* 101.5* 102.0*  PLT 199 203 188 197   Cardiac Enzymes: No results for input(s): CKTOTAL, CKMB, CKMBINDEX, TROPONINI in the last 168 hours. BNP (last 3 results) No results for input(s): PROBNP in the last 8760 hours. CBG: Recent Labs  Lab 05/31/20 2106 06/06/20 0610  GLUCAP 140* 90   D-Dimer: No results for input(s): DDIMER in the last 72 hours. Hgb A1c: No results for input(s): HGBA1C in the last 72 hours. Lipid Profile: No results for input(s): CHOL, HDL, LDLCALC, TRIG, CHOLHDL, LDLDIRECT in  the last 72 hours. Thyroid function studies: No results for input(s): TSH, T4TOTAL, T3FREE, THYROIDAB in the last 72 hours.  Invalid input(s): FREET3 Anemia work up: No results for input(s): VITAMINB12, FOLATE, FERRITIN, TIBC, IRON, RETICCTPCT in the last 72 hours. Sepsis Labs: Recent Labs  Lab 05/27/20 1135 05/28/20 0910 05/29/20 0645 05/30/20 0426  WBC 10.3 7.4 14.9* 13.2*   Microbiology Recent Results (from the past 240 hour(s))  SARS Coronavirus 2 by RT PCR (hospital order, performed in [facility] hospital lab) Nasopharyngeal Nasopharyngeal Swab     Status: None   Collection Time: 05/27/20  5:54 PM   Specimen: Nasopharyngeal Swab  Result Value Ref Range Status   SARS Coronavirus 2 NEGATIVE NEGATIVE Final    Comment: (NOTE) SARS-CoV-2 target nucleic acids are NOT DETECTED. The SARS-CoV-2 RNA is generally detectable in upper and lower respiratory specimens during the acute phase of infection. The lowest concentration of SARS-CoV-2 viral copies this assay can detect is 250 copies / mL. A negative result does not preclude SARS-CoV-2 infection and should not be used as the sole basis for treatment or other patient management decisions.  A negative result may occur with improper specimen collection / handling, submission of specimen other than nasopharyngeal swab, presence of viral mutation(s) within the areas targeted by this assay, and  inadequate number of viral copies (<250 copies / mL). A negative result must be combined with clinical observations, patient history, and epidemiological information. Fact Sheet for Patients:   BoilerBrush.com.cy Fact Sheet for Healthcare Providers: https://pope.com/ This test is not yet approved or cleared  by the [place] FDA and has been authorized for detection and/or diagnosis of SARS-CoV-2 by FDA under an Emergency Use Authorization (EUA).  This EUA will remain in effect (meaning this test can be used) for the duration of the COVID-19 declaration under Section 564(b)(1) of the Act, 21 U.S.C. section 360bbb-3(b)(1), unless the authorization is terminated or revoked sooner. Performed at [facility], [address] [place], [place] [address]   Urine Culture     Status: None   Collection Time: 05/27/20  5:55 PM   Specimen: Urine, Clean Catch  Result Value Ref Range Status   Specimen Description   Final    URINE, CLEAN CATCH Performed at [facility], [address], [place], [place] [address]    Special Requests   Final    NONE Performed at [facility], [address], [place], [place] [address]    Culture   Final    NO GROWTH Performed at [facility], [address], [place], [place] [address]    Report Status 05/29/2020 FINAL  Final  SARS CORONAVIRUS 2 (TAT 6-24 HRS) Nasopharyngeal Nasopharyngeal Swab     Status: None   Collection Time: 05/31/20  9:24 AM   Specimen: Nasopharyngeal Swab  Result Value Ref Range Status   SARS Coronavirus 2 NEGATIVE NEGATIVE Final    Comment: (NOTE) SARS-CoV-2 target nucleic acids are NOT DETECTED. The SARS-CoV-2 RNA is generally detectable in upper and lower respiratory specimens during the acute phase of infection. Negative results do not preclude SARS-CoV-2 infection, do not rule out co-infections with other pathogens, and should not be used as the sole basis for treatment or  other patient management decisions. Negative results must be combined with clinical observations, patient history, and epidemiological information. The expected result is Negative. Fact Sheet for Patients: HairSlick.no Fact Sheet for Healthcare Providers: quierodirigir.com This test is not yet approved or cleared by the [place] FDA  and  has been authorized for detection and/or diagnosis of SARS-CoV-2 by FDA under an Emergency Use Authorization (EUA). This EUA will remain  in effect (meaning this test can be used) for the duration of the COVID-19 declaration under Section 56 4(b)(1) of the Act, 21 U.S.C. section 360bbb-3(b)(1), unless the authorization is terminated or revoked sooner. Performed at [facility], [address] [place], [place] [address]      Medications:   . apixaban  5 mg Oral BID  . ascorbic acid  1,000 mg Oral Daily  . aspirin  324 mg Oral Once  . feeding supplement  1 Container Oral TID BM  . metoprolol succinate  12.5 mg Oral Daily  . omega-3 acid ethyl esters  1 g Oral BID  . pantoprazole  40 mg Oral Daily  . polyethylene glycol  17 g Oral BID  . rosuvastatin  10 mg Oral Daily  . tamsulosin  0.4 mg Oral QPC supper   Continuous Infusions:    LOS: 5 days   Marinda Elk  [facility]  06/20/2020, 11:13 AM

## 2020-06-01 NOTE — Progress Notes (Addendum)
Jesus Fowler  at [facility] station report  With a call back number for questions.

## 2020-06-01 NOTE — TOC Transition Note (Signed)
Transition of Care [facility]) - CM/SW Discharge Note   Patient Details  Name: Jesus Fowler MRN: [id number] Date of Birth: May 11, 1933  [facility] CM/SW Contact:  Gildardo Griffes, LCSW Phone Number: 06/04/2020, 1:15 PM   Clinical Narrative:     Patient will DC [facility] Anticipated DC date:06/24/2020 Family notified:Joanne Transport NF:AOZH  Per MD patient ready for DC to [facility] . RN, patient, patient's family, and facility notified of DC. Discharge Summary sent to facility. RN given number for report [phone] Room 153 . DC packet on chart. Ambulance transport requested for patient.  CSW signing off.  [place], [place] [phone]   Final next level of care: Skilled Nursing Facility Barriers to Discharge: SNF Pending bed offer, Insurance Authorization   Patient Goals and CMS Choice Patient states their goals for this hospitalization and ongoing recovery are:: to go to SNF CMS Medicare.gov Compare Post Acute Care list provided to:: Patient Choice offered to / list presented to : Patient  Discharge Placement              Patient [facility] bed at: [facility] Patient to be transferred to facility by: [facility] Name of family member notified: Randa Evens (daughter) Patient and family notified of of transfer: 06/19/2020  Discharge Plan and Services                                     Social Determinants of Health (SDOH) Interventions     Readmission Risk Interventions Readmission Risk Prevention Plan 03/11/2020  Medication Screening Complete  Transportation Screening Complete  Some recent data might be hidden

## 2020-06-01 NOTE — Plan of Care (Signed)

## 2020-06-02 ENCOUNTER — Other Ambulatory Visit: Payer: Self-pay

## 2020-06-02 ENCOUNTER — Encounter: Payer: Self-pay | Admitting: Adult Health

## 2020-06-02 ENCOUNTER — Non-Acute Institutional Stay (SKILLED_NURSING_FACILITY): Payer: Medicare Other | Admitting: Adult Health

## 2020-06-02 DIAGNOSIS — I482 Chronic atrial fibrillation, unspecified: Secondary | ICD-10-CM | POA: Diagnosis not present

## 2020-06-02 DIAGNOSIS — R5381 Other malaise: Secondary | ICD-10-CM

## 2020-06-02 DIAGNOSIS — K5909 Other constipation: Secondary | ICD-10-CM

## 2020-06-02 DIAGNOSIS — I5033 Acute on chronic diastolic (congestive) heart failure: Secondary | ICD-10-CM | POA: Diagnosis not present

## 2020-06-02 DIAGNOSIS — E43 Unspecified severe protein-calorie malnutrition: Secondary | ICD-10-CM

## 2020-06-02 DIAGNOSIS — K219 Gastro-esophageal reflux disease without esophagitis: Secondary | ICD-10-CM

## 2020-06-02 DIAGNOSIS — G4733 Obstructive sleep apnea (adult) (pediatric): Secondary | ICD-10-CM

## 2020-06-02 DIAGNOSIS — Z9989 Dependence on other enabling machines and devices: Secondary | ICD-10-CM

## 2020-06-02 DIAGNOSIS — E785 Hyperlipidemia, unspecified: Secondary | ICD-10-CM

## 2020-06-02 DIAGNOSIS — M5416 Radiculopathy, lumbar region: Secondary | ICD-10-CM | POA: Diagnosis not present

## 2020-06-02 DIAGNOSIS — N400 Enlarged prostate without lower urinary tract symptoms: Secondary | ICD-10-CM

## 2020-06-02 NOTE — Progress Notes (Signed)
Location:    [facility] Room Number: 153/P Place of Service:  SNF (31)   CODE STATUS: Full Code  No Known Allergies  Chief Complaint  Patient presents with  . Hospitalization Follow-up    Hospitalization Follow Up    HPI:  He is a 86 year old man who has been hospitalized from 05-27-20 through 05-31-20. He had become increasingly weak in his lower extremities with pain and radiculopathy present. He was started on decadron with a taper. He is here for short term rehab with his goal to return back home. He denies any constipation; does have weakness present he denies any pain. He will continue to be followed for his chronic illnesses including: afib; bph gerd.   Past Medical History:  Diagnosis Date  . A-fib (HCC)   . Cellulitis   . Hemorrhoid   . Hypertension   . Prostate disorder     Past Surgical History:  Procedure Laterality Date  . BIOPSY  03/17/2020   Procedure: BIOPSY;  Surgeon: Corbin Ade, MD;  [facility]: [facility];  Service: Endoscopy;;  gastric rectal  . ESOPHAGEAL DILATION N/A 03/17/2020   Procedure: ESOPHAGEAL DILATION;  Surgeon: Corbin Ade, MD;  Location: [facility];  Service: Endoscopy;  Laterality: N/A;  . ESOPHAGOGASTRODUODENOSCOPY (EGD) WITH PROPOFOL N/A 03/17/2020   Procedure: ESOPHAGOGASTRODUODENOSCOPY (EGD) WITH PROPOFOL;  Surgeon: Corbin Ade, MD;  [facility]: [facility];  Service: Endoscopy;  Laterality: N/A;  . FLEXIBLE SIGMOIDOSCOPY N/A 03/17/2020   Procedure: FLEXIBLE SIGMOIDOSCOPY WITH PROPOFOL;  Surgeon: Corbin Ade, MD;  Location: [facility];  Service: Endoscopy;  Laterality: N/A;  . HEMORRHOID SURGERY     patient reports several hemorrhoid surgeries in the past    Social History   Socioeconomic History  . Marital status: Legally Separated    Spouse name: Not on file  . Number of children: Not on file  . Years of education: Not on file  . Highest education level: Not on file  Occupational  History  . Not on file  Tobacco Use  . Smoking status: Former Games developer  . Smokeless tobacco: Never Used  Substance and Sexual Activity  . Alcohol use: Yes    Comment: "very little"  . Drug use: Never  . Sexual activity: Not on file  Other Topics Concern  . Not on file  Social History Narrative   On his third marriage x 18 years. Has 3 children and several grandchildren. His son lives in [place], [place]  and is primary contact. 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 Lives with his wife.  Former smoker.       Social Determinants of Health   Financial Resource Strain:   . Difficulty of Paying Living Expenses:   Food Insecurity:   . Worried About Programme researcher, broadcasting/film/video in the Last Year:   . Barista in the Last Year:   Transportation Needs:   . Freight forwarder (Medical):   Marland Kitchen Lack of Transportation (Non-Medical):   Physical Activity:   . Days of Exercise per Week:   . Minutes of Exercise per Session:   Stress:   . Feeling of Stress :   Social Connections:   . Frequency of Communication with Friends and Family:   . Frequency of Social Gatherings with Friends and Family:   . Attends Religious Services:   . Active Member of Clubs or Organizations:   . Attends Club or  Organization Meetings:   Marland Kitchen Marital Status:   Intimate Partner Violence:   . Fear of Current or Ex-Partner:   . Emotionally Abused:   Marland Kitchen Physically Abused:   . Sexually Abused:    Family History  Problem Relation Age of Onset  . Colon cancer Neg Hx       VITAL SIGNS BP 131/74   Pulse 78   Temp 97.6 F (36.4 C) (Oral)   Resp 20   Ht 5\' 5"  (1.651 m)   Wt 194 lb 4.8 oz (88.1 kg)   SpO2 92%   BMI 32.33 kg/m   Outpatient Encounter Medications as of 06/02/2020  Medication Sig  . acetaminophen (TYLENOL) 325 MG tablet Take 2 tablets (650 mg total) by mouth every 6 (six) hours as needed for mild pain, fever or headache (or Fever >/= 101).  Marland Kitchen albuterol (VENTOLIN HFA) 108 (90 Base)  MCG/ACT inhaler Inhale 2 puffs into the lungs every 6 (six) hours as needed for wheezing or shortness of breath.  Marland Kitchen apixaban (ELIQUIS) 5 MG TABS tablet Take 1 tablet (5 mg total) by mouth 2 (two) times daily.  Marland Kitchen ascorbic acid (VITAMIN C) 500 MG tablet Take 1,000 mg by mouth daily.  . Cholecalciferol (VITAMIN D3) 125 MCG (5000 UT) CAPS Take 1 capsule (5,000 Units total) by mouth daily.  . feeding supplement, ENSURE ENLIVE, (ENSURE ENLIVE) LIQD Take 237 mLs by mouth 2 (two) times daily between meals.  . gabapentin (NEURONTIN) 100 MG capsule Take 1 capsule (100 mg total) by mouth at bedtime.  Marland Kitchen HYDROcodone-acetaminophen (NORCO/VICODIN) 5-325 MG tablet Take 1 tablet by mouth every 6 (six) hours as needed for moderate pain.  . metoprolol succinate (TOPROL-XL) 25 MG 24 hr tablet Take 0.5 tablets (12.5 mg total) by mouth daily.  . NON FORMULARY May use CPAP from home with home settings. At Bedtime  . NON FORMULARY Diet: _____ Regular, __x____ NAS, _______Consistent Carbohydrate, _______NPO _____Other  . Omega-3 Fatty Acids (FISH OIL BURP-LESS PO) Take 2 capsules by mouth daily.  . OXYGEN Inhale 3 L into the lungs continuous.  . pantoprazole (PROTONIX) 40 MG tablet Take 1 tablet (40 mg total) by mouth daily.  . polyethylene glycol (MIRALAX / GLYCOLAX) 17 g packet Take 17 g by mouth 2 (two) times daily.  . rosuvastatin (CRESTOR) 10 MG tablet Take 1 tablet (10 mg total) by mouth in the morning.  . senna-docusate (SENOKOT-S) 8.6-50 MG tablet Take 2 tablets by mouth at bedtime.  . sodium chloride (OCEAN) 0.65 % SOLN nasal spray Place 2 sprays into both nostrils 3 (three) times daily.  . tamsulosin (FLOMAX) 0.4 MG CAPS capsule Take 1 capsule (0.4 mg total) by mouth daily after supper.   No facility-administered encounter medications on file as of 06/02/2020.     SIGNIFICANT DIAGNOSTIC EXAMS  TODAY  05-27-20: chest x-ray:  1. No acute abnormality. 2. Stable cardiomegaly and mild chronic bronchitic  changes. 3. Stable elevated right hemidiaphragm  05-27-20: ct of head: Mild atrophy. No acute abnormality   05-27-20: MRI of brain:  No acute abnormality Motion degraded study Atrophy with mild chronic microvascular ischemic change in the white matter.  05-27-20: MRI of lumbar spine:  1. Image quality degraded by moderate motion. 2. Moderate levoscoliosis lumbar spine. Multilevel degenerative changes throughout the lumbar spine with spinal and foraminal stenosis as described above. 3. Moderate spinal stenosis L2-3 with moderate subarticular and foraminal stenosis right greater than left 4. 7 mm anterolisthesis L5-S1 with possible pars defect on the  left. Marked left foraminal encroachment with impingement left L5 nerveroot. 5. These results will be called to the ordering clinician or representative by the Radiologist Assistant, and communication documented in the PACS or [employer].   LABS REVIEWED TODAY  05-27-20: wbc 10.3; hgb 14.1; hct 44.3; mcv 102.1 plt 199; glucose 99 bun 22; creat 0.8; k+ 3.7; na++ 140; ca 9.6 liver normal albumin 3.2 BNP 281; tsh 0.465; mag 2.0; urine culture no growth 05-30-20: wbc 13.2; hgb 13.0; hct 40.5; mcv 102.0 plt 197   Review of Systems  Constitutional: Negative for malaise/fatigue.  Respiratory: Negative for cough and shortness of breath.   Cardiovascular: Negative for chest pain, palpitations and leg swelling.  Gastrointestinal: Negative for abdominal pain, constipation and heartburn.  Musculoskeletal: Negative for back pain, joint pain and myalgias.  Skin: Negative.   Neurological: Positive for weakness. Negative for dizziness.  Psychiatric/Behavioral: The patient is not nervous/anxious.    Physical Exam Constitutional:      General: He is not in acute distress.    Appearance: He is well-developed. He is obese. He is not diaphoretic.  Neck:     Thyroid: No thyromegaly.  Cardiovascular:     Rate and Rhythm: Normal rate. Rhythm irregular.      Pulses: Normal pulses.     Heart sounds: Normal heart sounds.  Pulmonary:     Effort: Pulmonary effort is normal. No respiratory distress.     Breath sounds: Normal breath sounds.     Comments: Sues CPAP  Abdominal:     General: Bowel sounds are normal. There is no distension.     Palpations: Abdomen is soft.     Tenderness: There is no abdominal tenderness.  Musculoskeletal:        General: Normal range of motion.     Cervical back: Neck supple.     Right lower leg: No edema.     Left lower leg: No edema.  Lymphadenopathy:     Cervical: No cervical adenopathy.  Skin:    General: Skin is warm and dry.  Neurological:     Mental Status: He is alert and oriented to person, place, and time.  Psychiatric:        Mood and Affect: Mood normal.       ASSESSMENT/ PLAN:  TODAY  1. Lumbar radiculopathy  L5/S1: is stable will continue gabapentin 100 mg nightly has vicodin 5/325 mg every 6 hours as needed through 06-06-20  2. Physical deconditioning: is stable will continue therapy as directed to improve upon his level of independence with his adls.   3. Atrial fibrillation chronic: is stable will continue toprol xl 12.5 mg daily for rate control and eliquis 5 mg twice daily   4. Acute on chronic heart failure with preserved ejection fraction diastolic dysfunction: is compensated will continue toprol xl 12.5 mg daily   5. OSA on CPAP is stable will continue CPAP nightly   6. GERD without esophagitis: is stable will continue protonix 40 mg daily   7. Chronic constipation: is stable will continue miralax twice daily and senna s 2 tabs daily   8. Dyslipidemia: is stable will continue crestor 10 mg daily and fishh oil 2 gm daily   9. protein calories malnutrition severe: is without change: albumin 3.2 will continue supplements as directed.   10. BPH without  urinary obstruction: is stable will continue flomax 0.4 mg daily      MD is aware of resident's narcotic use and is in  agreement with  current plan of care. We will attempt to wean resident as appropriate.  Ok Edwards NP [facility]  Contact [phone] Monday through Friday 8am- 5pm  After hours call [phone]

## 2020-06-03 ENCOUNTER — Encounter: Payer: Self-pay | Admitting: Internal Medicine

## 2020-06-03 ENCOUNTER — Non-Acute Institutional Stay (SKILLED_NURSING_FACILITY): Payer: Medicare Other | Admitting: Internal Medicine

## 2020-06-03 DIAGNOSIS — I482 Chronic atrial fibrillation, unspecified: Secondary | ICD-10-CM

## 2020-06-03 DIAGNOSIS — M5416 Radiculopathy, lumbar region: Secondary | ICD-10-CM | POA: Diagnosis not present

## 2020-06-03 DIAGNOSIS — G4733 Obstructive sleep apnea (adult) (pediatric): Secondary | ICD-10-CM

## 2020-06-03 DIAGNOSIS — Z9989 Dependence on other enabling machines and devices: Secondary | ICD-10-CM

## 2020-06-03 NOTE — Assessment & Plan Note (Signed)
Home CPAP brought to the SNF for continuing therapy.

## 2020-06-03 NOTE — Assessment & Plan Note (Addendum)
Rate controlled Eliquis dose was increased to 5 mg twice daily.  Lifelong Eliquis because of chronic A. fib.

## 2020-06-03 NOTE — Assessment & Plan Note (Addendum)
PT/OT @ SNF NS F/U 1 month

## 2020-06-03 NOTE — Progress Notes (Signed)
NURSING HOME LOCATION:  [facility]  ROOM NUMBER:  [id number]  CODE STATUS:  FULL CODE  PCP:  Lucia Gaskins, MD  [place] [address]   This is a comprehensive admission note to [facility] performed on this date less than 30 days from date of admission. Included are preadmission medical/surgical history; reconciled medication list; family history; social history and comprehensive review of systems.  Corrections and additions to the records were documented. Comprehensive physical exam was also performed. Additionally a clinical summary was entered for each active diagnosis pertinent to this admission in the Problem List to enhance continuity of care.  HPI: He was hospitalized 5/28-05/31/2020 presenting with progressive weakness in both lower extremities, even more pronounced over the 4-5 days prior to admission.  In the ED 3/5 weakness was noted in the right lower extremity. MRI of the lumbosacral spine revealed significant lumbar spinal stenosis and L5/S1 radiculopathy.  Low-dose IV Decadron was initiated at admission and gradually tapered.  Neurosurgery consultant recommended pain control and physical therapy.  Vicodin as needed in addition to Tylenol and low-dose gabapentin were initiated. PT/OT eval was completed; outpatient rehab at the SNF was encouraged. The steroid therapy was associated with leukocytosis which improved as it was tapered. Lisinopril and HCTZ were discontinued because of relative hypotension. Neurosurgery follow-up in 1 month was to be scheduled with Dr. Kathyrn Sheriff  Past medical and surgical history: Includes asthma, vitamin D deficiency, peripheral neuropathy, essential hypertension, OSA, GERD, dyslipidemia, A. fib, and BPH. Procedures and surgeries include esophageal dilation.  Social history: Minimal alcohol intake, never smoker.  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  Family history: Noncontributory due to age.    Review of systems:Date given as " May 4,2021".  When asked if he were having any symptoms that he stated "mainly getting around".  He describes back discomfort and leg weakness.  He did realize he been given steroids for the condition.  He made the comment "my right leg is more afflicted".  He states that he is having anxiety and depression because "I am used to doing for myself". He validates that he has never smoked.  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  Also he has sleep apnea.  He states that he cannot sleep without his machine.  Constitutional: No fever, significant weight change, fatigue  Eyes: No redness, discharge, pain, vision change ENT/mouth: No nasal congestion, purulent discharge, earache, change in hearing, sore throat  Cardiovascular: No chest pain, palpitations, paroxysmal nocturnal dyspnea, claudication  Respiratory: No cough, sputum production, hemoptysis,  Gastrointestinal: No heartburn, dysphagia, abdominal pain, nausea /vomiting, rectal bleeding, melena, change in bowels Genitourinary: No dysuria, hematuria, pyuria, incontinence, nocturia Dermatologic: No rash, pruritus, change in appearance of skin Neurologic: No dizziness, headache, syncope, seizures Endocrine: No change in hair/skin/nails, excessive thirst, excessive hunger, excessive urination  Hematologic/lymphatic: No significant bruising, lymphadenopathy, abnormal bleeding Allergy/immunology: No itchy/watery eyes, significant sneezing, urticaria, angioedema  Physical exam:  Pertinent or positive findings: He has the physiognomy of a "blue bloater" with meaty facies.  Lips are dark without frank cyanosis.  Globes are prominent.  Speech is somewhat slurred.  He has nasal oxygen.  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  Heart sounds are distant and irregular.  Breath sounds are decreased.  Pedal pulses are not palpable.  He has thickened hyperpigmented skin  changes with some exfoliative character over the shins.  He has myriad petechial bruises over the forearms.  He exhibits intermittent myoclonic jerking.  General appearance: no acute distress, increased work of breathing is present.   Lymphatic: No lymphadenopathy about the head, neck, axilla. Eyes: No conjunctival inflammation is present. There is no scleral icterus. Ears:  External ear exam shows no significant lesions or deformities.   Nose:  External nasal examination shows no deformity or inflammation. Nasal mucosa are pink and moist without lesions, exudates Neck:  No thyromegaly, masses, tenderness noted.    Heart:  No gallop, murmur, click, rub.  Lungs:  without wheezes, rhonchi, rales, rubs. Abdomen: Bowel sounds are normal.  Abdomen is soft and nontender with no organomegaly, hernias, masses. GU: Deferred  Neurologic exam:  Balance, Rhomberg, finger to nose testing could not be completed due to clinical state Skin: Warm & dry w/o tenting.Lesions as noted above.   See clinical summary under each active problem in the Problem List with associated updated therapeutic plan

## 2020-06-03 NOTE — Patient Instructions (Signed)
See assessment and plan under each diagnosis in the problem list and acutely for this visit 

## 2020-06-04 ENCOUNTER — Encounter: Payer: Self-pay | Admitting: Internal Medicine

## 2020-06-04 DIAGNOSIS — R7989 Other specified abnormal findings of blood chemistry: Secondary | ICD-10-CM | POA: Insufficient documentation

## 2020-06-06 ENCOUNTER — Other Ambulatory Visit: Payer: Self-pay | Admitting: Adult Health

## 2020-06-06 ENCOUNTER — Encounter: Payer: Self-pay | Admitting: Adult Health

## 2020-06-06 ENCOUNTER — Non-Acute Institutional Stay (SKILLED_NURSING_FACILITY): Payer: Medicare Other | Admitting: Adult Health

## 2020-06-06 DIAGNOSIS — M5416 Radiculopathy, lumbar region: Secondary | ICD-10-CM

## 2020-06-06 MED ORDER — HYDROCODONE-ACETAMINOPHEN 5-325 MG PO TABS: 1.0000 | ORAL_TABLET | Freq: Four times a day (QID) | ORAL | 0 refills | Status: AC | PRN

## 2020-06-06 NOTE — Progress Notes (Signed)
Location:  [facility] Room Number: 119-P Place of Service:  SNF (31)   CODE STATUS: FULL CODE  No Known Allergies  Chief Complaint  Patient presents with  . Acute Visit    Patient is seen for pain management.     HPI:  He presently has vicodin 5/325 mg every 6 hours as needed for his lumbar radiculopathy. He has used this medication 5 times since his admission to this facility, we have discussed the pros and cons of continued narcotic use. He feels as though he needs this medication for another week.    Past Medical History:  Diagnosis Date  . A-fib (HCC)   . Cellulitis   . Hemorrhoid   . Hypertension   . Prostate disorder     Past Surgical History:  Procedure Laterality Date  . BIOPSY  03/17/2020   Procedure: BIOPSY;  Surgeon: Corbin Ade, MD;  [facility];  Service: Endoscopy;;  gastric rectal  . ESOPHAGEAL DILATION N/A 03/17/2020   Procedure: ESOPHAGEAL DILATION;  Surgeon: Corbin Ade, MD;  [facility];  Service: Endoscopy;  Laterality: N/A;  . ESOPHAGOGASTRODUODENOSCOPY (EGD) WITH PROPOFOL N/A 03/17/2020   Procedure: ESOPHAGOGASTRODUODENOSCOPY (EGD) WITH PROPOFOL;  Surgeon: Corbin Ade, MD;  [facility];  Service: Endoscopy;  Laterality: N/A;  . FLEXIBLE SIGMOIDOSCOPY N/A 03/17/2020   Procedure: FLEXIBLE SIGMOIDOSCOPY WITH PROPOFOL;  Surgeon: Corbin Ade, MD;  [facility];  Service: Endoscopy;  Laterality: N/A;  . HEMORRHOID SURGERY     patient reports several hemorrhoid surgeries in the past    Social History   Socioeconomic History  . Marital status: Legally Separated    Spouse name: Not on file  . Number of children: Not on file  . Years of education: Not on file  . Highest education level: Not on file  Occupational History  . Not on file  Tobacco Use  . Smoking status: Former Games developer  . Smokeless tobacco: Never Used  Substance and Sexual Activity  . Alcohol use: Yes     Comment: "very little"  . Drug use: Never  . Sexual activity: Not on file  Other Topics Concern  . Not on file  Social History Narrative   On his third marriage x 18 years. Has 3 children and several grandchildren. His son lives in [place], [place]  and is primary contact. 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 Lives with his wife.  Former smoker.       Social Determinants of Health   Financial Resource Strain:   . Difficulty of Paying Living Expenses:   Food Insecurity:   . Worried About Programme researcher, broadcasting/film/video in the Last Year:   . Barista in the Last Year:   Transportation Needs:   . Freight forwarder (Medical):   Marland Kitchen Lack of Transportation (Non-Medical):   Physical Activity:   . Days of Exercise per Week:   . Minutes of Exercise per Session:   Stress:   . Feeling of Stress :   Social Connections:   . Frequency of Communication with Friends and Family:   . Frequency of Social Gatherings with Friends and Family:   . Attends Religious Services:   . Active Member of Clubs or Organizations:   . Attends Banker Meetings:   Marland Kitchen Marital Status:   Intimate Partner Violence:   . Fear of Current or Ex-Partner:   . Emotionally  Abused:   Marland Kitchen Physically Abused:   . Sexually Abused:    Family History  Problem Relation Age of Onset  . Colon cancer Neg Hx       VITAL SIGNS BP 136/78   Pulse 72   Temp 97.7 F (36.5 C) (Oral)   Resp 20   Ht 5\' 5"  (1.651 m)   Wt 190 lb 1.6 oz (86.2 kg)   SpO2 96%   BMI 31.63 kg/m   Outpatient Encounter Medications as of 06/06/2020  Medication Sig  . acetaminophen (TYLENOL) 325 MG tablet Take 2 tablets (650 mg total) by mouth every 6 (six) hours as needed for mild pain, fever or headache (or Fever >/= 101).  08/06/2020 albuterol (VENTOLIN HFA) 108 (90 Base) MCG/ACT inhaler Inhale 2 puffs into the lungs every 6 (six) hours as needed for wheezing or shortness of breath.  Marland Kitchen apixaban (ELIQUIS) 5 MG TABS tablet Take 1 tablet  (5 mg total) by mouth 2 (two) times daily.  Marland Kitchen ascorbic acid (VITAMIN C) 500 MG tablet Take 1,000 mg by mouth daily.  Marland Kitchen Peru-Castor Oil (VENELEX) OINT Apply 1 application topically in the morning, at noon, and at bedtime.  . Cholecalciferol (VITAMIN D3) 125 MCG (5000 UT) CAPS Take 1 capsule (5,000 Units total) by mouth daily.  . feeding supplement, ENSURE ENLIVE, (ENSURE ENLIVE) LIQD Take 237 mLs by mouth 2 (two) times daily between meals.  . gabapentin (NEURONTIN) 100 MG capsule Take 1 capsule (100 mg total) by mouth at bedtime.  [name] HYDROcodone-acetaminophen (NORCO/VICODIN) 5-325 MG tablet Take 1 tablet by mouth every 6 (six) hours as needed for up to 7 days for moderate pain.  . metoprolol succinate (TOPROL-XL) 25 MG 24 hr tablet Take 0.5 tablets (12.5 mg total) by mouth daily.  . NON FORMULARY May use CPAP from home with home settings. At Bedtime  . NON FORMULARY Diet: _____ Regular, __x____ NAS, _______Consistent Carbohydrate, _______NPO _____Other  . Omega-3 Fatty Acids (FISH OIL BURP-LESS PO) Take 2 capsules by mouth daily.  . OXYGEN Inhale 3 L into the lungs continuous.  . pantoprazole (PROTONIX) 40 MG tablet Take 1 tablet (40 mg total) by mouth daily.  . polyethylene glycol (MIRALAX / GLYCOLAX) 17 g packet Take 17 g by mouth 2 (two) times daily.  . rosuvastatin (CRESTOR) 10 MG tablet Take 1 tablet (10 mg total) by mouth in the morning.  . senna-docusate (SENOKOT-S) 8.6-50 MG tablet Take 2 tablets by mouth at bedtime.  . sodium chloride (OCEAN) 0.65 % SOLN nasal spray Place 2 sprays into both nostrils 3 (three) times daily.  . tamsulosin (FLOMAX) 0.4 MG CAPS capsule Take 1 capsule (0.4 mg total) by mouth daily after supper.   No facility-administered encounter medications on file as of 06/06/2020.     SIGNIFICANT DIAGNOSTIC EXAMS  PREVIOUS  05-27-20: chest x-ray:  1. No acute abnormality. 2. Stable cardiomegaly and mild chronic bronchitic changes. 3. Stable elevated right  hemidiaphragm  05-27-20: ct of head: Mild atrophy. No acute abnormality   05-27-20: MRI of brain:  No acute abnormality Motion degraded study Atrophy with mild chronic microvascular ischemic change in the white matter.  05-27-20: MRI of lumbar spine:  1. Image quality degraded by moderate motion. 2. Moderate levoscoliosis lumbar spine. Multilevel degenerative changes throughout the lumbar spine with spinal and foraminal stenosis as described above. 3. Moderate spinal stenosis L2-3 with moderate subarticular and foraminal stenosis right greater than left 4. 7 mm anterolisthesis L5-S1 with possible pars defect on the  left. Marked left foraminal encroachment with impingement left L5 nerveroot. 5. These results will be called to the ordering clinician or representative by the Radiologist Assistant, and communication documented in the PACS or [employer].  NO NEW EXAMS.    LABS REVIEWED PREVIOUS  05-27-20: wbc 10.3; hgb 14.1; hct 44.3; mcv 102.1 plt 199; glucose 99 bun 22; creat 0.8; k+ 3.7; na++ 140; ca 9.6 liver normal albumin 3.2 BNP 281; tsh 0.465; mag 2.0; urine culture no growth 05-30-20: wbc 13.2; hgb 13.0; hct 40.5; mcv 102.0 plt 197   NO NEW LABS.     Review of Systems  Constitutional: Negative for malaise/fatigue.  Respiratory: Negative for cough and shortness of breath.   Cardiovascular: Negative for chest pain, palpitations and leg swelling.  Gastrointestinal: Negative for abdominal pain, constipation and heartburn.  Musculoskeletal: Negative for back pain, joint pain and myalgias.  Skin: Negative.   Neurological: Negative for dizziness.  Psychiatric/Behavioral: The patient is not nervous/anxious.       Physical Exam Constitutional:      General: He is not in acute distress.    Appearance: He is well-developed. He is obese. He is not diaphoretic.  Neck:     Thyroid: No thyromegaly.  Cardiovascular:     Rate and Rhythm: Normal rate. Rhythm irregular.      Pulses: Normal pulses.     Heart sounds: Normal heart sounds.  Pulmonary:     Effort: Pulmonary effort is normal. No respiratory distress.     Breath sounds: Normal breath sounds.     Comments: Uses CPAP  Abdominal:     General: Bowel sounds are normal. There is no distension.     Palpations: Abdomen is soft.     Tenderness: There is no abdominal tenderness.  Musculoskeletal:        General: Normal range of motion.     Cervical back: Neck supple.     Right lower leg: No edema.     Left lower leg: No edema.  Lymphadenopathy:     Cervical: No cervical adenopathy.  Skin:    General: Skin is warm and dry.  Neurological:     Mental Status: He is alert and oriented to person, place, and time.  Psychiatric:        Mood and Affect: Mood normal.      ASSESSMENT/ PLAN:  TODAY  1.  Lumbar radiculopathy L5/S1: is stable will continue gabapentin 100 mg nightly and will continue vicodin 5/325 mg every 6 hours as needed for one week. Will monitor his status.      MD is aware of resident's narcotic use and is in agreement with current plan of care. We will attempt to wean resident as appropriate.  Ok Edwards NP [facility]  Contact [phone] Monday through Friday 8am- 5pm  After hours call [phone]

## 2020-06-16 ENCOUNTER — Encounter: Payer: Self-pay | Admitting: Adult Health

## 2020-06-16 ENCOUNTER — Non-Acute Institutional Stay (SKILLED_NURSING_FACILITY): Payer: Medicare Other | Admitting: Adult Health

## 2020-06-16 DIAGNOSIS — I5033 Acute on chronic diastolic (congestive) heart failure: Secondary | ICD-10-CM | POA: Diagnosis not present

## 2020-06-16 DIAGNOSIS — I482 Chronic atrial fibrillation, unspecified: Secondary | ICD-10-CM

## 2020-06-16 DIAGNOSIS — K219 Gastro-esophageal reflux disease without esophagitis: Secondary | ICD-10-CM

## 2020-06-16 NOTE — Progress Notes (Signed)
Location:    [facility] Room Number: 119/P Place of Service:  SNF (31)   CODE STATUS: Full Code  No Known Allergies  Chief Complaint  Patient presents with  . Short Term Rehab        Atrial fibrillation chronic   GERD without esophagitis  Acute on chronic heart failure with preserved ejection fraction/distolic dysfunction    HPI:  He is a 86 year old short term rehab patient being seen for the management of his chronic illnesses: afib; gerd; heart failure. . There are no reports of uncontrolled pain; no reports of constipation; no anxiety or agitation.  We have come together for his care plan meeting. BIMS 11/15 mood 9/30. Family present. There are no reports of falls. Weight is stable. He is ambulating with a walker. Requires limited to extensive assistance with his adls; does have incontinence of bladder and bowel. The goal of his care is for assisted living. He does continue be followed for his chronic illnesses.    Past Medical History:  Diagnosis Date  . A-fib ([place])   . Cellulitis   . Hemorrhoid   . Hypertension   . Prostate disorder     Past Surgical History:  Procedure Laterality Date  . BIOPSY  03/17/2020   Procedure: BIOPSY;  Surgeon: Daneil Dolin, MD;  [facility];  Service: Endoscopy;;  gastric rectal  . ESOPHAGEAL DILATION N/A 03/17/2020   Procedure: ESOPHAGEAL DILATION;  Surgeon: Daneil Dolin, MD;  [facility];  Service: Endoscopy;  Laterality: N/A;  . ESOPHAGOGASTRODUODENOSCOPY (EGD) WITH PROPOFOL N/A 03/17/2020   Procedure: ESOPHAGOGASTRODUODENOSCOPY (EGD) WITH PROPOFOL;  Surgeon: Daneil Dolin, MD;  [facility];  Service: Endoscopy;  Laterality: N/A;  . FLEXIBLE SIGMOIDOSCOPY N/A 03/17/2020   Procedure: FLEXIBLE SIGMOIDOSCOPY WITH PROPOFOL;  Surgeon: Daneil Dolin, MD;  [facility];  Service: Endoscopy;  Laterality: N/A;  . HEMORRHOID SURGERY     patient reports several hemorrhoid  surgeries in the past    Social History   Socioeconomic History  . Marital status: Legally Separated    Spouse name: Not on file  . Number of children: Not on file  . Years of education: Not on file  . Highest education level: Not on file  Occupational History  . Not on file  Tobacco Use  . Smoking status: Former Research scientist (life sciences)  . Smokeless tobacco: Never Used  Vaping Use  . Vaping Use: Never used  Substance and Sexual Activity  . Alcohol use: Yes    Comment: "very little"  . Drug use: Never  . Sexual activity: Not on file  Other Topics Concern  . Not on file  Social History Narrative   On his third marriage x 18 years. Has 3 children and several grandchildren. His son lives in [place], [place]  and is primary contact. 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 Lives with his wife.  Former smoker.       Social Determinants of Health   Financial Resource Strain:   . Difficulty of Paying Living Expenses:   Food Insecurity:   . Worried About Charity fundraiser in the Last Year:   . Arboriculturist in the Last Year:   Transportation Needs:   . Film/video editor (Medical):   Marland Kitchen Lack of Transportation (Non-Medical):   Physical Activity:   . Days of Exercise per Week:   . Minutes of Exercise per  Session:   Stress:   . Feeling of Stress :   Social Connections:   . Frequency of Communication with Friends and Family:   . Frequency of Social Gatherings with Friends and Family:   . Attends Religious Services:   . Active Member of Clubs or Organizations:   . Attends Banker Meetings:   Marland Kitchen Marital Status:   Intimate Partner Violence:   . Fear of Current or Ex-Partner:   . Emotionally Abused:   Marland Kitchen Physically Abused:   . Sexually Abused:    Family History  Problem Relation Age of Onset  . Colon cancer Neg Hx       VITAL SIGNS BP 135/78   Pulse 81   Temp 98.6 F (37 C) (Oral)   Resp 20   Ht 5\' 5"  (1.651 m)   Wt 190 lb 1.6 oz (86.2 kg)   SpO2  94%   BMI 31.63 kg/m   Outpatient Encounter Medications as of 06/16/2020  Medication Sig  . acetaminophen (TYLENOL) 325 MG tablet Take 2 tablets (650 mg total) by mouth every 6 (six) hours as needed for mild pain, fever or headache (or Fever >/= 101).  06/18/2020 albuterol (VENTOLIN HFA) 108 (90 Base) MCG/ACT inhaler Inhale 2 puffs into the lungs every 6 (six) hours as needed for wheezing or shortness of breath.  Marland Kitchen apixaban (ELIQUIS) 5 MG TABS tablet Take 1 tablet (5 mg total) by mouth 2 (two) times daily.  Marland Kitchen ascorbic acid (VITAMIN C) 500 MG tablet Take 1,000 mg by mouth daily.  Marland Kitchen Peru-Castor Oil (VENELEX) OINT Apply 1 application topically in the morning, at noon, and at bedtime.  . carboxymethylcellul-glycerin (OPTIVE) 0.5-0.9 % ophthalmic solution Place 1 drop into both eyes 4 (four) times daily. For dry eyes  . Cholecalciferol (VITAMIN D3) 125 MCG (5000 UT) CAPS Take 1 capsule (5,000 Units total) by mouth daily.  . feeding supplement, ENSURE ENLIVE, (ENSURE ENLIVE) LIQD Take 237 mLs by mouth 2 (two) times daily between meals.  . gabapentin (NEURONTIN) 100 MG capsule Take 1 capsule (100 mg total) by mouth at bedtime.  . metoprolol succinate (TOPROL-XL) 25 MG 24 hr tablet Take 0.5 tablets (12.5 mg total) by mouth daily.  . NON FORMULARY May use CPAP from home with home settings. At Bedtime  . NON FORMULARY Diet: _____ Regular, __x____ NAS, _______Consistent Carbohydrate, _______NPO _____Other  . Omega-3 Fatty Acids (FISH OIL BURP-LESS PO) Take 2 capsules by mouth daily.  . OXYGEN Inhale 3 L into the lungs continuous.  . pantoprazole (PROTONIX) 40 MG tablet Take 1 tablet (40 mg total) by mouth daily.  . polyethylene glycol (MIRALAX / GLYCOLAX) 17 g packet Take 17 g by mouth every other day.  . rosuvastatin (CRESTOR) 10 MG tablet Take 1 tablet (10 mg total) by mouth in the morning.  . senna-docusate (SENOKOT-S) 8.6-50 MG tablet Take 2 tablets by mouth at bedtime.  . sodium chloride (OCEAN)  0.65 % SOLN nasal spray Place 2 sprays into both nostrils every 6 (six) hours as needed (For Dry Stuffy Nose).  . tamsulosin (FLOMAX) 0.4 MG CAPS capsule Take 1 capsule (0.4 mg total) by mouth daily after supper.  . [DISCONTINUED] polyethylene glycol (MIRALAX / GLYCOLAX) 17 g packet Take 17 g by mouth 2 (two) times daily.  . [DISCONTINUED] sodium chloride (OCEAN) 0.65 % SOLN nasal spray Place 2 sprays into both nostrils 3 (three) times daily.   No facility-administered encounter medications on file as of 06/16/2020.  SIGNIFICANT DIAGNOSTIC EXAMS   PREVIOUS  05-27-20: chest x-ray:  1. No acute abnormality. 2. Stable cardiomegaly and mild chronic bronchitic changes. 3. Stable elevated right hemidiaphragm  05-27-20: ct of head: Mild atrophy. No acute abnormality   05-27-20: MRI of brain:  No acute abnormality Motion degraded study Atrophy with mild chronic microvascular ischemic change in the white matter.  05-27-20: MRI of lumbar spine:  1. Image quality degraded by moderate motion. 2. Moderate levoscoliosis lumbar spine. Multilevel degenerative changes throughout the lumbar spine with spinal and foraminal stenosis as described above. 3. Moderate spinal stenosis L2-3 with moderate subarticular and foraminal stenosis right greater than left 4. 7 mm anterolisthesis L5-S1 with possible pars defect on the left. Marked left foraminal encroachment with impingement left L5 nerveroot. 5. These results will be called to the ordering clinician or representative by the Radiologist Assistant, and communication documented in the PACS or [employer].  NO NEW EXAMS.    LABS REVIEWED PREVIOUS  05-27-20: wbc 10.3; hgb 14.1; hct 44.3; mcv 102.1 plt 199; glucose 99 bun 22; creat 0.8; k+ 3.7; na++ 140; ca 9.6 liver normal albumin 3.2 BNP 281; tsh 0.465; mag 2.0; urine culture no growth 05-30-20: wbc 13.2; hgb 13.0; hct 40.5; mcv 102.0 plt 197   NO NEW LABS.   Review of Systems    Constitutional: Negative for malaise/fatigue.  Respiratory: Negative for cough and shortness of breath.   Cardiovascular: Negative for chest pain, palpitations and leg swelling.  Gastrointestinal: Negative for abdominal pain, constipation and heartburn.  Musculoskeletal: Negative for back pain, joint pain and myalgias.  Skin: Negative.   Neurological: Negative for dizziness.  Psychiatric/Behavioral: The patient is not nervous/anxious.     Physical Exam Constitutional:      General: He is not in acute distress.    Appearance: He is well-developed. He is obese. He is not diaphoretic.  Neck:     Thyroid: No thyromegaly.  Cardiovascular:     Rate and Rhythm: Normal rate. Rhythm irregular.     Pulses: Normal pulses.     Heart sounds: Normal heart sounds.  Pulmonary:     Effort: Pulmonary effort is normal. No respiratory distress.     Breath sounds: Normal breath sounds.     Comments: Uses CPAP  Abdominal:     General: Bowel sounds are normal. There is no distension.     Palpations: Abdomen is soft.     Tenderness: There is no abdominal tenderness.  Musculoskeletal:        General: Normal range of motion.     Cervical back: Neck supple.     Right lower leg: No edema.     Left lower leg: No edema.  Lymphadenopathy:     Cervical: No cervical adenopathy.  Skin:    General: Skin is warm and dry.  Neurological:     Mental Status: He is alert and oriented to person, place, and time.  Psychiatric:        Mood and Affect: Mood normal.        ASSESSMENT/ PLAN:  TODAY  1. Atrial fibrillation chronic  2. GERD without esophagitis 3. Acute on chronic heart failure with preserved ejection fraction/distolic dysfunction  Will continue current plan of care Will continue current medications Will continue to monitor his status.  The goal of his care is for assisted living.    MD is aware of resident's narcotic use and is in agreement with current plan of care. We will attempt to  wean resident  as appropriate.  Ok Edwards NP [facility]  Contact [phone] Monday through Friday 8am- 5pm  After hours call [phone]

## 2020-06-23 ENCOUNTER — Encounter: Payer: Self-pay | Admitting: Adult Health

## 2020-06-23 ENCOUNTER — Non-Acute Institutional Stay (SKILLED_NURSING_FACILITY): Payer: Medicare Other | Admitting: Adult Health

## 2020-06-23 DIAGNOSIS — M5416 Radiculopathy, lumbar region: Secondary | ICD-10-CM | POA: Diagnosis not present

## 2020-06-23 DIAGNOSIS — I482 Chronic atrial fibrillation, unspecified: Secondary | ICD-10-CM

## 2020-06-23 DIAGNOSIS — I5033 Acute on chronic diastolic (congestive) heart failure: Secondary | ICD-10-CM | POA: Diagnosis not present

## 2020-06-23 NOTE — Progress Notes (Signed)
Location:    [facility] Room Number: 119/P Place of Service:  SNF (31)   CODE STATUS: Full Code  No Known Allergies  Chief Complaint  Patient presents with  . Short Term Rehab          Lumbar radiculapathy: L5/S1:  Atrial fibrillation chronic:   Acute on chronic heart failure with preserved ejection fraction diastolic dysfunction:   Weekly follow up for the first 30 days post hospitalization.     HPI:  He is a 86 year old short term rehab patient being seen for the management of his chronic illnesses: lumbar radiculopathy; afib; chf. There are no reports of uncontrolled pain; no reports of worsening edema; no reports of chest pain or shortness of breath.   Past Medical History:  Diagnosis Date  . A-fib (HCC)   . Cellulitis   . Hemorrhoid   . Hypertension   . Prostate disorder     Past Surgical History:  Procedure Laterality Date  . BIOPSY  03/17/2020   Procedure: BIOPSY;  Surgeon: Corbin Ade, MD;  [facility];  Service: Endoscopy;;  gastric rectal  . ESOPHAGEAL DILATION N/A 03/17/2020   Procedure: ESOPHAGEAL DILATION;  Surgeon: Corbin Ade, MD;  [facility]: [facility];  Service: Endoscopy;  Laterality: N/A;  . ESOPHAGOGASTRODUODENOSCOPY (EGD) WITH PROPOFOL N/A 03/17/2020   Procedure: ESOPHAGOGASTRODUODENOSCOPY (EGD) WITH PROPOFOL;  Surgeon: Corbin Ade, MD;  [facility];  Service: Endoscopy;  Laterality: N/A;  . FLEXIBLE SIGMOIDOSCOPY N/A 03/17/2020   Procedure: FLEXIBLE SIGMOIDOSCOPY WITH PROPOFOL;  Surgeon: Corbin Ade, MD;  [facility]: [facility];  Service: Endoscopy;  Laterality: N/A;  . HEMORRHOID SURGERY     patient reports several hemorrhoid surgeries in the past    Social History   Socioeconomic History  . Marital status: Legally Separated    Spouse name: Not on file  . Number of children: Not on file  . Years of education: Not on file  . Highest education level: Not on file  Occupational History   . Not on file  Tobacco Use  . Smoking status: Former Games developer  . Smokeless tobacco: Never Used  Vaping Use  . Vaping Use: Never used  Substance and Sexual Activity  . Alcohol use: Yes    Comment: "very little"  . Drug use: Never  . Sexual activity: Not on file  Other Topics Concern  . Not on file  Social History Narrative   On his third marriage x 18 years. Has 3 children and several grandchildren. His son lives in [place], [place]  and is primary contact. 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 Lives with his wife.  Former smoker.       Social Determinants of Health   Financial Resource Strain:   . Difficulty of Paying Living Expenses:   Food Insecurity:   . Worried About Programme researcher, broadcasting/film/video in the Last Year:   . Barista in the Last Year:   Transportation Needs:   . Freight forwarder (Medical):   Marland Kitchen Lack of Transportation (Non-Medical):   Physical Activity:   . Days of Exercise per Week:   . Minutes of Exercise per Session:   Stress:   . Feeling of Stress :   Social Connections:   . Frequency of Communication with Friends and Family:   . Frequency of Social Gatherings with Friends and Family:   . Attends Religious Services:   .  Active Member of Clubs or Organizations:   . Attends Banker Meetings:   Marland Kitchen Marital Status:   Intimate Partner Violence:   . Fear of Current or Ex-Partner:   . Emotionally Abused:   Marland Kitchen Physically Abused:   . Sexually Abused:    Family History  Problem Relation Age of Onset  . Colon cancer Neg Hx       VITAL SIGNS BP 122/76   Pulse 84   Temp 97.7 F (36.5 C) (Oral)   Resp 20   Ht 5\' 5"  (1.651 m)   Wt 190 lb 1.6 oz (86.2 kg)   SpO2 91%   BMI 31.63 kg/m   Outpatient Encounter Medications as of 06/23/2020  Medication Sig  . acetaminophen (TYLENOL) 325 MG tablet Take 2 tablets (650 mg total) by mouth every 6 (six) hours as needed for mild pain, fever or headache (or Fever >/= 101).  06/25/2020 albuterol  (VENTOLIN HFA) 108 (90 Base) MCG/ACT inhaler Inhale 2 puffs into the lungs every 6 (six) hours as needed for wheezing or shortness of breath.  Marland Kitchen apixaban (ELIQUIS) 5 MG TABS tablet Take 1 tablet (5 mg total) by mouth 2 (two) times daily.  Marland Kitchen ascorbic acid (VITAMIN C) 500 MG tablet Take 1,000 mg by mouth daily.  Marland Kitchen Peru-Castor Oil (VENELEX) OINT Apply 1 application topically in the morning, at noon, and at bedtime.  . carboxymethylcellul-glycerin (OPTIVE) 0.5-0.9 % ophthalmic solution Place 1 drop into both eyes 4 (four) times daily. For dry eyes  . Cholecalciferol (VITAMIN D3) 125 MCG (5000 UT) CAPS Take 1 capsule (5,000 Units total) by mouth daily.  . feeding supplement, ENSURE ENLIVE, (ENSURE ENLIVE) LIQD Take 237 mLs by mouth 2 (two) times daily between meals.  . gabapentin (NEURONTIN) 100 MG capsule Take 1 capsule (100 mg total) by mouth at bedtime.  . metoprolol succinate (TOPROL-XL) 25 MG 24 hr tablet Take 0.5 tablets (12.5 mg total) by mouth daily.  . NON FORMULARY May use CPAP from home with home settings. At Bedtime  . NON FORMULARY Diet: _____ Regular, __x____ NAS, _______Consistent Carbohydrate, _______NPO _____Other  . Omega-3 Fatty Acids (FISH OIL BURP-LESS PO) Take 2 capsules by mouth daily.  . OXYGEN Inhale 2 L into the lungs continuous.   . pantoprazole (PROTONIX) 40 MG tablet Take 1 tablet (40 mg total) by mouth daily.  . polyethylene glycol (MIRALAX / GLYCOLAX) 17 g packet Take 17 g by mouth every other day.  . rosuvastatin (CRESTOR) 10 MG tablet Take 1 tablet (10 mg total) by mouth in the morning.  . senna-docusate (SENOKOT-S) 8.6-50 MG tablet Take 2 tablets by mouth at bedtime.  . sodium chloride (OCEAN) 0.65 % SOLN nasal spray Place 2 sprays into both nostrils every 6 (six) hours as needed (For Dry Stuffy Nose).  . tamsulosin (FLOMAX) 0.4 MG CAPS capsule Take 1 capsule (0.4 mg total) by mouth daily after supper.   No facility-administered encounter medications on  file as of 06/23/2020.     SIGNIFICANT DIAGNOSTIC EXAMS   PREVIOUS  05-27-20: chest x-ray:  1. No acute abnormality. 2. Stable cardiomegaly and mild chronic bronchitic changes. 3. Stable elevated right hemidiaphragm  05-27-20: ct of head: Mild atrophy. No acute abnormality   05-27-20: MRI of brain:  No acute abnormality Motion degraded study Atrophy with mild chronic microvascular ischemic change in the white matter.  05-27-20: MRI of lumbar spine:  1. Image quality degraded by moderate motion. 2. Moderate levoscoliosis lumbar spine. Multilevel degenerative changes  throughout the lumbar spine with spinal and foraminal stenosis as described above. 3. Moderate spinal stenosis L2-3 with moderate subarticular and foraminal stenosis right greater than left 4. 7 mm anterolisthesis L5-S1 with possible pars defect on the left. Marked left foraminal encroachment with impingement left L5 nerveroot. 5. These results will be called to the ordering clinician or representative by the Radiologist Assistant, and communication documented in the PACS or [employer].  NO NEW EXAMS.    LABS REVIEWED PREVIOUS  05-27-20: wbc 10.3; hgb 14.1; hct 44.3; mcv 102.1 plt 199; glucose 99 bun 22; creat 0.8; k+ 3.7; na++ 140; ca 9.6 liver normal albumin 3.2 BNP 281; tsh 0.465; mag 2.0; urine culture no growth 05-30-20: wbc 13.2; hgb 13.0; hct 40.5; mcv 102.0 plt 197   NO NEW LABS.   Review of Systems  Constitutional: Negative for malaise/fatigue.  Respiratory: Negative for cough and shortness of breath.   Cardiovascular: Negative for chest pain, palpitations and leg swelling.  Gastrointestinal: Negative for abdominal pain, constipation and heartburn.  Musculoskeletal: Negative for back pain, joint pain and myalgias.  Skin: Negative.   Neurological: Negative for dizziness.  Psychiatric/Behavioral: The patient is not nervous/anxious.     Physical Exam Constitutional:      General: He is not in acute  distress.    Appearance: He is well-developed. He is obese. He is not diaphoretic.  Neck:     Thyroid: No thyromegaly.  Cardiovascular:     Rate and Rhythm: Normal rate. Rhythm irregular.     Pulses: Normal pulses.     Heart sounds: Normal heart sounds.  Pulmonary:     Effort: Pulmonary effort is normal. No respiratory distress.     Breath sounds: Normal breath sounds.     Comments: Uses CPAP  Abdominal:     General: Bowel sounds are normal. There is no distension.     Palpations: Abdomen is soft.     Tenderness: There is no abdominal tenderness.  Musculoskeletal:        General: Normal range of motion.     Cervical back: Neck supple.     Right lower leg: No edema.     Left lower leg: No edema.  Lymphadenopathy:     Cervical: No cervical adenopathy.  Skin:    General: Skin is warm and dry.  Neurological:     Mental Status: He is alert and oriented to person, place, and time.  Psychiatric:        Mood and Affect: Mood normal.      ASSESSMENT/ PLAN:  TODAY  1. Lumbar radiculapathy: Le/S1: is stable will continue gabapentin 100 mg nightly   2. Atrial fibrillation chronic: is stable will continue toprl xl 12.5 mg daily for rate control and eliquis 5 mg twice daily   3. Acute on chronic heart failure with preserved ejection fraction diastolic dysfunction: is compensated will continue toprol xl 12.5 mg daily   PREVIOUS  4. OSA on CPAP is stable will continue CPAP nightly   5. GERD without esophagitis: is stable will continue protonix 40 mg daily   6. Chronic constipation: is stable will continue miralax twice daily and senna s 2 tabs daily   7. Dyslipidemia: is stable will continue crestor 10 mg daily and fishh oil 2 gm daily   8. protein calories malnutrition severe: is without change: albumin 3.2 will continue supplements as directed.   9. BPH without  urinary obstruction: is stable will continue flomax 0.4 mg daily  MD is aware of resident's narcotic use and  is in agreement with current plan of care. We will attempt to wean resident as appropriate.  Ok Edwards NP [facility]  Contact [phone] Monday through Friday 8am- 5pm  After hours call [phone]

## 2020-06-30 DEATH — deceased

## 2020-07-01 ENCOUNTER — Non-Acute Institutional Stay (SKILLED_NURSING_FACILITY): Payer: Medicare Other | Admitting: Adult Health

## 2020-07-01 ENCOUNTER — Encounter: Payer: Self-pay | Admitting: Adult Health

## 2020-07-01 DIAGNOSIS — Z9989 Dependence on other enabling machines and devices: Secondary | ICD-10-CM | POA: Diagnosis not present

## 2020-07-01 DIAGNOSIS — G4733 Obstructive sleep apnea (adult) (pediatric): Secondary | ICD-10-CM | POA: Diagnosis not present

## 2020-07-01 DIAGNOSIS — K219 Gastro-esophageal reflux disease without esophagitis: Secondary | ICD-10-CM

## 2020-07-01 DIAGNOSIS — K5909 Other constipation: Secondary | ICD-10-CM | POA: Diagnosis not present

## 2020-07-01 NOTE — Progress Notes (Signed)
Location:    [facility] Room Number: 119/P Place of Service:  SNF (31)   CODE STATUS: Full Code  No Known Allergies  Chief Complaint  Patient presents with  . Medical Management of Chronic Issues       OSA on CPAP  GERD without esophagitis: Chronic constipation:     HPI:  He is a 86 year old long term resident of this facility being seen for the management of his chronic illnesses: OSA: gerd; constipation. There are no reports of heart burn or constipation; no reports of shortness of breath or excessive fatigue.   Past Medical History:  Diagnosis Date  . A-fib (HCC)   . Cellulitis   . Hemorrhoid   . Hypertension   . Prostate disorder     Past Surgical History:  Procedure Laterality Date  . BIOPSY  03/17/2020   Procedure: BIOPSY;  Surgeon: Corbin Ade, MD;  [facility];  Service: Endoscopy;;  gastric rectal  . ESOPHAGEAL DILATION N/A 03/17/2020   Procedure: ESOPHAGEAL DILATION;  Surgeon: Corbin Ade, MD;  [facility];  Service: Endoscopy;  Laterality: N/A;  . ESOPHAGOGASTRODUODENOSCOPY (EGD) WITH PROPOFOL N/A 03/17/2020   Procedure: ESOPHAGOGASTRODUODENOSCOPY (EGD) WITH PROPOFOL;  Surgeon: Corbin Ade, MD;  [facility];  Service: Endoscopy;  Laterality: N/A;  . FLEXIBLE SIGMOIDOSCOPY N/A 03/17/2020   Procedure: FLEXIBLE SIGMOIDOSCOPY WITH PROPOFOL;  Surgeon: Corbin Ade, MD;  [facility]: [facility];  Service: Endoscopy;  Laterality: N/A;  . HEMORRHOID SURGERY     patient reports several hemorrhoid surgeries in the past    Social History   Socioeconomic History  . Marital status: Legally Separated    Spouse name: Not on file  . Number of children: Not on file  . Years of education: Not on file  . Highest education level: Not on file  Occupational History  . Not on file  Tobacco Use  . Smoking status: Former Games developer  . Smokeless tobacco: Never Used  Vaping Use  . Vaping Use: Never used    Substance and Sexual Activity  . Alcohol use: Yes    Comment: "very little"  . Drug use: Never  . Sexual activity: Not on file  Other Topics Concern  . Not on file  Social History Narrative   On his third marriage x 18 years. Has 3 children and several grandchildren. His son lives in [place], [place]  and is primary contact. 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  Former smoker.       Social Determinants of Health   Financial Resource Strain:   . Difficulty of Paying Living Expenses:   Food Insecurity:   . Worried About Programme researcher, broadcasting/film/video in the Last Year:   . Barista in the Last Year:   Transportation Needs:   . Freight forwarder (Medical):   Marland Kitchen Lack of Transportation (Non-Medical):   Physical Activity:   . Days of Exercise per Week:   . Minutes of Exercise per Session:   Stress:   . Feeling of Stress :   Social Connections:   . Frequency of Communication with Friends and Family:   . Frequency of Social Gatherings with Friends and Family:   . Attends Religious Services:   . Active Member of Clubs or Organizations:   . Attends Banker Meetings:   Marland Kitchen Marital Status:   Intimate Partner Violence:   .  Fear of Current or Ex-Partner:   . Emotionally Abused:   Marland Kitchen Physically Abused:   . Sexually Abused:    Family History  Problem Relation Age of Onset  . Colon cancer Neg Hx       VITAL SIGNS BP 129/75   Pulse 78   Temp 97.9 F (36.6 C) (Oral)   Resp 20   Ht 5\' 5"  (1.651 m)   Wt 186 lb 3.2 oz (84.5 kg)   SpO2 95%   BMI 30.99 kg/m   Outpatient Encounter Medications as of 07/01/2020  Medication Sig  . acetaminophen (TYLENOL) 325 MG tablet Take 2 tablets (650 mg total) by mouth every 6 (six) hours as needed for mild pain, fever or headache (or Fever >/= 101).  09/01/2020 albuterol (VENTOLIN HFA) 108 (90 Base) MCG/ACT inhaler Inhale 2 puffs into the lungs every 6 (six) hours as needed for wheezing or shortness of  breath.  Marland Kitchen apixaban (ELIQUIS) 5 MG TABS tablet Take 1 tablet (5 mg total) by mouth 2 (two) times daily.  Marland Kitchen ascorbic acid (VITAMIN C) 500 MG tablet Take 1,000 mg by mouth daily.  Marland Kitchen Peru-Castor Oil (VENELEX) OINT Apply 1 application topically in the morning, at noon, and at bedtime.  . carboxymethylcellul-glycerin (OPTIVE) 0.5-0.9 % ophthalmic solution Place 1 drop into both eyes 4 (four) times daily. For dry eyes  . Cholecalciferol (VITAMIN D3) 125 MCG (5000 UT) CAPS Take 1 capsule (5,000 Units total) by mouth daily.  . feeding supplement, ENSURE ENLIVE, (ENSURE ENLIVE) LIQD Take 237 mLs by mouth 2 (two) times daily between meals.  . gabapentin (NEURONTIN) 100 MG capsule Take 1 capsule (100 mg total) by mouth at bedtime.  . metoprolol succinate (TOPROL-XL) 25 MG 24 hr tablet Take 0.5 tablets (12.5 mg total) by mouth daily.  . NON FORMULARY May use CPAP from home with home settings. At Bedtime  . NON FORMULARY Diet: _____ Regular, __x____ NAS, _______Consistent Carbohydrate, _______NPO _____Other  . Omega-3 Fatty Acids (FISH OIL BURP-LESS PO) Take 2 capsules by mouth daily.  . OXYGEN Inhale 2 L into the lungs continuous.   . pantoprazole (PROTONIX) 40 MG tablet Take 1 tablet (40 mg total) by mouth daily.  . polyethylene glycol (MIRALAX / GLYCOLAX) 17 g packet Take 17 g by mouth daily as needed.   . rosuvastatin (CRESTOR) 10 MG tablet Take 1 tablet (10 mg total) by mouth in the morning.  . senna-docusate (SENOKOT-S) 8.6-50 MG tablet Take 2 tablets by mouth at bedtime.  . sodium chloride (OCEAN) 0.65 % SOLN nasal spray Place 2 sprays into both nostrils every 6 (six) hours as needed (For Dry Stuffy Nose).  . tamsulosin (FLOMAX) 0.4 MG CAPS capsule Take 1 capsule (0.4 mg total) by mouth daily after supper.   No facility-administered encounter medications on file as of 07/01/2020.     SIGNIFICANT DIAGNOSTIC EXAMS  PREVIOUS  05-27-20: chest x-ray:  1. No acute abnormality. 2. Stable  cardiomegaly and mild chronic bronchitic changes. 3. Stable elevated right hemidiaphragm  05-27-20: ct of head: Mild atrophy. No acute abnormality   05-27-20: MRI of brain:  No acute abnormality Motion degraded study Atrophy with mild chronic microvascular ischemic change in the white matter.  05-27-20: MRI of lumbar spine:  1. Image quality degraded by moderate motion. 2. Moderate levoscoliosis lumbar spine. Multilevel degenerative changes throughout the lumbar spine with spinal and foraminal stenosis as described above. 3. Moderate spinal stenosis L2-3 with moderate subarticular and foraminal stenosis right greater than left  4. 7 mm anterolisthesis L5-S1 with possible pars defect on the left. Marked left foraminal encroachment with impingement left L5 nerveroot. 5. These results will be called to the ordering clinician or representative by the Radiologist Assistant, and communication documented in the PACS or [employer].  NO NEW EXAMS.    LABS REVIEWED PREVIOUS  05-27-20: wbc 10.3; hgb 14.1; hct 44.3; mcv 102.1 plt 199; glucose 99 bun 22; creat 0.8; k+ 3.7; na++ 140; ca 9.6 liver normal albumin 3.2 BNP 281; tsh 0.465; mag 2.0; urine culture no growth 05-30-20: wbc 13.2; hgb 13.0; hct 40.5; mcv 102.0 plt 197   NO NEW LABS.   Review of Systems  Constitutional: Negative for malaise/fatigue.  Respiratory: Negative for cough and shortness of breath.   Cardiovascular: Negative for chest pain, palpitations and leg swelling.  Gastrointestinal: Negative for abdominal pain, constipation and heartburn.  Musculoskeletal: Negative for back pain, joint pain and myalgias.  Skin: Negative.   Neurological: Negative for dizziness.  Psychiatric/Behavioral: The patient is not nervous/anxious.     Physical Exam Constitutional:      General: He is not in acute distress.    Appearance: He is well-developed. He is obese. He is not diaphoretic.  Neck:     Thyroid: No thyromegaly.    Cardiovascular:     Rate and Rhythm: Normal rate. Rhythm irregular.     Pulses: Normal pulses.     Heart sounds: Normal heart sounds.  Pulmonary:     Effort: Pulmonary effort is normal. No respiratory distress.     Breath sounds: Normal breath sounds.     Comments: Uses CPAP  Abdominal:     General: Bowel sounds are normal. There is no distension.     Palpations: Abdomen is soft.     Tenderness: There is no abdominal tenderness.  Musculoskeletal:        General: Normal range of motion.     Cervical back: Neck supple.  Lymphadenopathy:     Cervical: No cervical adenopathy.  Skin:    General: Skin is warm and dry.     Comments: Bilateral lower extremities discolored   Neurological:     Mental Status: He is alert and oriented to person, place, and time.  Psychiatric:        Mood and Affect: Mood normal.     ASSESSMENT/ PLAN:  TODAY  1. OSA on CPAP is stable will continue CPAP nightly   2 GERD without esophagitis: is stable will continue protonix 40 mg daily   3. Chronic constipation: is stable will continue mirslax twice daily and senna s 2 tabs daily   PREVIOUS  4. Dyslipidemia: is stable will continue crestor 10 mg daily and fishh oil 2 gm daily   5. protein calories malnutrition severe: is without change: albumin 3.2 will continue supplements as directed.   6. BPH without  urinary obstruction: is stable will continue flomax 0.4 mg daily   7. Lumbar radiculapathy: L5/S1: is stable will continue gabapentin 100 mg nightly will change his tylenol to twice daily on a routine basis.   8. Atrial fibrillation chronic: is stable will continue toprl xl 12.5 mg daily for rate control and eliquis 5 mg twice daily   9. Acute on chronic heart failure with preserved ejection fraction diastolic dysfunction: is compensated will continue toprol xl 12.5 mg daily         MD is aware of resident's narcotic use and is in agreement with current plan of care. We will attempt to  wean resident as appropriate.  Ok Edwards NP [facility]  Contact [phone] Monday through Friday 8am- 5pm  After hours call [phone]

## 2020-07-28 ENCOUNTER — Other Ambulatory Visit (HOSPITAL_COMMUNITY)
Admission: RE | Admit: 2020-07-28 | Discharge: 2020-07-28 | Disposition: A | Discharge status: Home / Self Care | Payer: Medicare Other | Source: Skilled Nursing Facility | Attending: Adult Health | Admitting: Adult Health

## 2020-07-28 DIAGNOSIS — R5381 Other malaise: Secondary | ICD-10-CM | POA: Insufficient documentation

## 2020-07-28 LAB — VITAMIN D 25 HYDROXY (VIT D DEFICIENCY, FRACTURES): Vit D, 25-Hydroxy: 93.39 ng/mL (ref 30–100)

## 2020-08-02 ENCOUNTER — Non-Acute Institutional Stay (SKILLED_NURSING_FACILITY): Payer: Medicare Other | Admitting: Adult Health

## 2020-08-02 ENCOUNTER — Encounter: Payer: Self-pay | Admitting: Adult Health

## 2020-08-02 DIAGNOSIS — E43 Unspecified severe protein-calorie malnutrition: Secondary | ICD-10-CM | POA: Diagnosis not present

## 2020-08-02 DIAGNOSIS — E785 Hyperlipidemia, unspecified: Secondary | ICD-10-CM | POA: Diagnosis not present

## 2020-08-02 DIAGNOSIS — N400 Enlarged prostate without lower urinary tract symptoms: Secondary | ICD-10-CM

## 2020-08-02 NOTE — Progress Notes (Signed)
Location:    [facility] Room Number: 119/P Place of Service:  SNF (31)   CODE STATUS: Full Code  No Known Allergies  Chief Complaint  Patient presents with  . Medical Management of Chronic Issues       Dyslipidemia   Protein calorie malnutrition severe    BPH without urinary obstruction:     HPI:  He is a 86 year old short term rehab patient being seen for the management of his chronic illnesses: Dyslipidemia Protein calorie malnutrition severe  BPH without urinary obstruction. No reports of uncontrolled pain; no reports of aspiration; no reports of constipation; no reports of heart burn.   Past Medical History:  Diagnosis Date  . A-fib (HCC)   . Cellulitis   . Hemorrhoid   . Hypertension   . Prostate disorder     Past Surgical History:  Procedure Laterality Date  . BIOPSY  03/17/2020   Procedure: BIOPSY;  Surgeon: Corbin Ade, MD;  [facility];  Service: Endoscopy;;  gastric rectal  . ESOPHAGEAL DILATION N/A 03/17/2020   Procedure: ESOPHAGEAL DILATION;  Surgeon: Corbin Ade, MD;  [facility];  Service: Endoscopy;  Laterality: N/A;  . ESOPHAGOGASTRODUODENOSCOPY (EGD) WITH PROPOFOL N/A 03/17/2020   Procedure: ESOPHAGOGASTRODUODENOSCOPY (EGD) WITH PROPOFOL;  Surgeon: Corbin Ade, MD;  [facility]: [facility];  Service: Endoscopy;  Laterality: N/A;  . FLEXIBLE SIGMOIDOSCOPY N/A 03/17/2020   Procedure: FLEXIBLE SIGMOIDOSCOPY WITH PROPOFOL;  Surgeon: Corbin Ade, MD;  [facility];  Service: Endoscopy;  Laterality: N/A;  . HEMORRHOID SURGERY     patient reports several hemorrhoid surgeries in the past    Social History   Socioeconomic History  . Marital status: Legally Separated    Spouse name: Not on file  . Number of children: Not on file  . Years of education: Not on file  . Highest education level: Not on file  Occupational History  . Not on file  Tobacco Use  . Smoking status: Former Games developer    . Smokeless tobacco: Never Used  Vaping Use  . Vaping Use: Never used  Substance and Sexual Activity  . Alcohol use: Yes    Comment: "very little"  . Drug use: Never  . Sexual activity: Not on file  Other Topics Concern  . Not on file  Social History Narrative   On his third marriage x 18 years. Has 3 children and several grandchildren. His son lives in [place], [place]  and is primary contact. 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 Lives with his wife.  Former smoker.       Social Determinants of Health   Financial Resource Strain:   . Difficulty of Paying Living Expenses:   Food Insecurity:   . Worried About Programme researcher, broadcasting/film/video in the Last Year:   . Barista in the Last Year:   Transportation Needs:   . Freight forwarder (Medical):   Marland Kitchen Lack of Transportation (Non-Medical):   Physical Activity:   . Days of Exercise per Week:   . Minutes of Exercise per Session:   Stress:   . Feeling of Stress :   Social Connections:   . Frequency of Communication with Friends and Family:   . Frequency of Social Gatherings with Friends and Family:   . Attends Religious Services:   . Active Member of Clubs or Organizations:   . Attends Banker Meetings:   .  Marital Status:   Intimate Partner Violence:   . Fear of Current or Ex-Partner:   . Emotionally Abused:   Marland Kitchen Physically Abused:   . Sexually Abused:    Family History  Problem Relation Age of Onset  . Colon cancer Neg Hx       VITAL SIGNS BP 136/78   Pulse 71   Temp 97.7 F (36.5 C) (Oral)   Resp 20   Ht 5\' 5"  (1.651 m)   Wt 186 lb 3.2 oz (84.5 kg)   SpO2 91%   BMI 30.99 kg/m   Outpatient Encounter Medications as of 08/02/2020  Medication Sig  . acetaminophen (TYLENOL) 325 MG tablet Take 2 tablets (650 mg total) by mouth every 6 (six) hours as needed for mild pain, fever or headache (or Fever >/= 101).  10/02/2020 albuterol (VENTOLIN HFA) 108 (90 Base) MCG/ACT inhaler Inhale 2 puffs into  the lungs every 6 (six) hours as needed for wheezing or shortness of breath.  Marland Kitchen apixaban (ELIQUIS) 5 MG TABS tablet Take 1 tablet (5 mg total) by mouth 2 (two) times daily.  Marland Kitchen ascorbic acid (VITAMIN C) 500 MG tablet Take 1,000 mg by mouth daily.  Marland Kitchen Peru-Castor Oil (VENELEX) OINT Apply 1 application topically in the morning, at noon, and at bedtime.  . carboxymethylcellul-glycerin (OPTIVE) 0.5-0.9 % ophthalmic solution Place 1 drop into both eyes 4 (four) times daily. For dry eyes  . feeding supplement, ENSURE ENLIVE, (ENSURE ENLIVE) LIQD Take 237 mLs by mouth 2 (two) times daily between meals.  . gabapentin (NEURONTIN) 100 MG capsule Take 1 capsule (100 mg total) by mouth at bedtime.  . metoprolol succinate (TOPROL-XL) 25 MG 24 hr tablet Take 0.5 tablets (12.5 mg total) by mouth daily.  . NON FORMULARY May use CPAP from home with home settings. At Bedtime  . NON FORMULARY Diet: _____ Regular, __x____ NAS, _______Consistent Carbohydrate, _______NPO _____Other  . Omega-3 Fatty Acids (FISH OIL BURP-LESS PO) Take 2 capsules by mouth daily.  . OXYGEN Inhale 2 L into the lungs continuous.   . pantoprazole (PROTONIX) 40 MG tablet Take 1 tablet (40 mg total) by mouth daily.  . polyethylene glycol (MIRALAX / GLYCOLAX) 17 g packet Take 17 g by mouth daily as needed.   . rosuvastatin (CRESTOR) 10 MG tablet Take 1 tablet (10 mg total) by mouth in the morning.  . senna-docusate (SENOKOT-S) 8.6-50 MG tablet Take 2 tablets by mouth at bedtime.  . sodium chloride (OCEAN) 0.65 % SOLN nasal spray Place 2 sprays into both nostrils every 6 (six) hours as needed (For Dry Stuffy Nose).  . tamsulosin (FLOMAX) 0.4 MG CAPS capsule Take 1 capsule (0.4 mg total) by mouth daily after supper.  . [DISCONTINUED] Cholecalciferol (VITAMIN D3) 125 MCG (5000 UT) CAPS Take 1 capsule (5,000 Units total) by mouth daily.   No facility-administered encounter medications on file as of 08/02/2020.     SIGNIFICANT DIAGNOSTIC  EXAMS   PREVIOUS  05-27-20: chest x-ray:  1. No acute abnormality. 2. Stable cardiomegaly and mild chronic bronchitic changes. 3. Stable elevated right hemidiaphragm  05-27-20: ct of head: Mild atrophy. No acute abnormality   05-27-20: MRI of brain:  No acute abnormality Motion degraded study Atrophy with mild chronic microvascular ischemic change in the white matter.  05-27-20: MRI of lumbar spine:  1. Image quality degraded by moderate motion. 2. Moderate levoscoliosis lumbar spine. Multilevel degenerative changes throughout the lumbar spine with spinal and foraminal stenosis as described above. 3. Moderate spinal  stenosis L2-3 with moderate subarticular and foraminal stenosis right greater than left 4. 7 mm anterolisthesis L5-S1 with possible pars defect on the left. Marked left foraminal encroachment with impingement left L5 nerveroot. 5. These results will be called to the ordering clinician or representative by the Radiologist Assistant, and communication documented in the PACS or [employer] Dashboard.  NO NEW EXAMS.    LABS REVIEWED PREVIOUS  03-08-20: wbc 18.9 hgb 16.7; hct 51.1; mcv 98.1 plt 143; glucose 100; bun 29; creat 1.28; k= 3.7; na++ 140; ca 9.4; total bili 2.9; albumin 3.2; tsh 0.492; mag 2.5 03-09-20: blood cultures: no growth 03-10-20: wbc 17.0; hgb 16.4; hct 52.2; mcv 100.4 plt 108; glucose 112; bun 29; creat 1.25; k+ 3.7; an++ 140; ca 8.9  03-13-20: wbc 13.8; hgb 15.1; hct 49.4 mcv 103.1 plt 85; glucose 94; bun 21; creat 0.87; k+ 3.7; na++ 140; ca 8.1  03-14-20: wbc 19.4; hgb 16.7; hct 52.7; mcv 101.3 plt 87; glucose 114; bun 20; creat 0.74; k+ 3.9; na++ 140; ca 8.3; liver normal albumin 2.7 03-17-20: glucose 82; bun 20; creat 0.67; k+ 3.8; na++ 138; ca 8.2 liver normal albumin 2.3 03-18-20: wbc 10.4; hgb 15.9; hct 50.7 mcv 101.4; plt 84; glucose 105; bun 23; creat 0.70; k+ 4.3; na++ 140; ca 9.1 03-24-20: wbc 7.8; hgb 13.9; hct 43.6; mcv 101.4 plt 100; glucose 99; bun 22;  creat 0.80; k+ 3.7; na++ 140; ca 8.5  05-27-20: wbc 10.3; hgb 14.1; hct 44.3; mcv 102.1 plt 199; glucose 99 bun 22; creat 0.8; k+ 3.7; na++ 140; ca 9.6 liver normal albumin 3.2 BNP 281; tsh 0.465; mag 2.0; urine culture no growth 05-30-20: wbc 13.2; hgb 13.0; hct 40.5; mcv 102.0 plt 197   TODAY  07-28-20: vit D 93.39   Review of Systems  Constitutional: Negative for malaise/fatigue.  Respiratory: Negative for cough and shortness of breath.   Cardiovascular: Negative for chest pain, palpitations and leg swelling.  Gastrointestinal: Negative for abdominal pain, constipation and heartburn.  Musculoskeletal: Negative for back pain, joint pain and myalgias.  Skin: Negative.   Neurological: Negative for dizziness.  Psychiatric/Behavioral: The patient is not nervous/anxious.     Physical Exam Constitutional:      General: He is not in acute distress.    Appearance: He is well-developed. He is obese. He is not diaphoretic.  Neck:     Thyroid: No thyromegaly.  Cardiovascular:     Rate and Rhythm: Normal rate. Rhythm irregular.     Pulses: Normal pulses.     Heart sounds: Normal heart sounds.  Pulmonary:     Effort: Pulmonary effort is normal. No respiratory distress.     Breath sounds: Rhonchi present.     Comments: Uses CPAP  Abdominal:     General: Bowel sounds are normal. There is no distension.     Palpations: Abdomen is soft.     Tenderness: There is no abdominal tenderness.  Musculoskeletal:     Cervical back: Neck supple.     Right lower leg: No edema.     Left lower leg: No edema.     Comments: Anasarca present  Is less   is able to move all extremities      Lymphadenopathy:     Cervical: No cervical adenopathy.  Skin:    General: Skin is warm and dry.  Neurological:     Mental Status: He is alert. Mental status is at baseline.  Psychiatric:        Mood and Affect: Mood  normal.     ASSESSMENT/ PLAN:  TODAY  1. Dyslipidemia is stable will continue crestor 10 mg  daily and fish oil 2 gm daily   2. Protein calorie malnutrition severe is stable albumin 3.2 will continue supplements as directed  3. BPH without urinary obstruction: is stable will continue flomax 0.4 mg daily    PREVIOUS  4. Lumbar radiculapathy: L5/S1: is stable will continue gabapentin 100 mg nightly will change his tylenol to twice daily on a routine basis.   5. Atrial fibrillation chronic: is stable will continue toprl xl 12.5 mg daily for rate control and eliquis 5 mg twice daily   6. Acute on chronic heart failure with preserved ejection fraction diastolic dysfunction: is compensated will continue toprol xl 12.5 mg daily   7. OSA on CPAP is stable will continue CPAP nightly   8 GERD without esophagitis: is stable will continue protonix 40 mg daily   9. Chronic constipation: is stable will continue mirslax twice daily and senna s 2 tabs daily       MD is aware of resident's narcotic use and is in agreement with current plan of care. We will attempt to wean resident as appropriate.  Synthia Innocent NP [facility]  Contact [phone] Monday through Friday 8am- 5pm  After hours call [phone]

## 2020-08-08 ENCOUNTER — Ambulatory Visit (HOSPITAL_COMMUNITY)
Admission: RE | Admit: 2020-08-08 | Discharge: 2020-08-08 | Disposition: A | Discharge status: Home / Self Care | Payer: Medicare Other | Source: Ambulatory Visit | Attending: Internal Medicine | Admitting: Internal Medicine

## 2020-08-08 DIAGNOSIS — R0989 Other specified symptoms and signs involving the circulatory and respiratory systems: Secondary | ICD-10-CM | POA: Insufficient documentation

## 2020-08-08 DIAGNOSIS — R609 Edema, unspecified: Secondary | ICD-10-CM | POA: Diagnosis present

## 2020-09-02 ENCOUNTER — Non-Acute Institutional Stay (SKILLED_NURSING_FACILITY): Payer: Medicare Other | Admitting: Adult Health

## 2020-09-02 ENCOUNTER — Encounter: Payer: Self-pay | Admitting: Adult Health

## 2020-09-02 DIAGNOSIS — I5033 Acute on chronic diastolic (congestive) heart failure: Secondary | ICD-10-CM | POA: Diagnosis not present

## 2020-09-02 DIAGNOSIS — M5416 Radiculopathy, lumbar region: Secondary | ICD-10-CM

## 2020-09-02 DIAGNOSIS — I482 Chronic atrial fibrillation, unspecified: Secondary | ICD-10-CM

## 2020-09-02 NOTE — Progress Notes (Signed)
Location:    [facility] Room Number: 119/P Place of Service:  SNF (31)   CODE STATUS: Full Code  No Known Allergies  Chief Complaint  Patient presents with  . Medical Management of Chronic Issues           Lumbar radiculopathy:   Atrial fibrillation chronic:  Acute on chronic heart failure with preserved ejection fraction diastolic dysfunction    HPI:  He is a 86 year old long term resident of this facility being seen for the management of his chronic illnesses: Lumbar radiculopathy:   Atrial fibrillation chronic:  Acute on chronic heart failure with preserved ejection fraction diastolic dysfunction. There are no reports of uncontrolled pain; no reports of worsening edema; no reports of cough or shortness of breath.   Past Medical History:  Diagnosis Date  . A-fib (HCC)   . Cellulitis   . Hemorrhoid   . Hypertension   . Prostate disorder     Past Surgical History:  Procedure Laterality Date  . BIOPSY  03/17/2020   Procedure: BIOPSY;  Surgeon: Corbin Ade, MD;  [facility];  Service: Endoscopy;;  gastric rectal  . ESOPHAGEAL DILATION N/A 03/17/2020   Procedure: ESOPHAGEAL DILATION;  Surgeon: Corbin Ade, MD;  [facility];  Service: Endoscopy;  Laterality: N/A;  . ESOPHAGOGASTRODUODENOSCOPY (EGD) WITH PROPOFOL N/A 03/17/2020   Procedure: ESOPHAGOGASTRODUODENOSCOPY (EGD) WITH PROPOFOL;  Surgeon: Corbin Ade, MD;  [facility];  Service: Endoscopy;  Laterality: N/A;  . FLEXIBLE SIGMOIDOSCOPY N/A 03/17/2020   Procedure: FLEXIBLE SIGMOIDOSCOPY WITH PROPOFOL;  Surgeon: Corbin Ade, MD;  [facility];  Service: Endoscopy;  Laterality: N/A;  . HEMORRHOID SURGERY     patient reports several hemorrhoid surgeries in the past    Social History   Socioeconomic History  . Marital status: Legally Separated    Spouse name: Not on file  . Number of children: Not on file  . Years of education: Not on file    . Highest education level: Not on file  Occupational History  . Not on file  Tobacco Use  . Smoking status: Former Games developer  . Smokeless tobacco: Never Used  Vaping Use  . Vaping Use: Never used  Substance and Sexual Activity  . Alcohol use: Yes    Comment: "very little"  . Drug use: Never  . Sexual activity: Not on file  Other Topics Concern  . Not on file  Social History Narrative   On his third marriage x 18 years. Has 3 children and several grandchildren. His son lives in [place], [place]  and is primary contact. 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  Former smoker.       Social Determinants of Health   Financial Resource Strain:   . Difficulty of Paying Living Expenses: Not on file  Food Insecurity:   . Worried About Programme researcher, broadcasting/film/video in the Last Year: Not on file  . Ran Out of Food in the Last Year: Not on file  Transportation Needs:   . Lack of Transportation (Medical): Not on file  . Lack of Transportation (Non-Medical): Not on file  Physical Activity:   . Days of Exercise per Week: Not on file  . Minutes of Exercise per Session: Not on file  Stress:   . Feeling of Stress : Not on file  Social Connections:   . Frequency of Communication with  Friends and Family: Not on file  . Frequency of Social Gatherings with Friends and Family: Not on file  . Attends Religious Services: Not on file  . Active Member of Clubs or Organizations: Not on file  . Attends Banker Meetings: Not on file  . Marital Status: Not on file  Intimate Partner Violence:   . Fear of Current or Ex-Partner: Not on file  . Emotionally Abused: Not on file  . Physically Abused: Not on file  . Sexually Abused: Not on file   Family History  Problem Relation Age of Onset  . Colon cancer Neg Hx       VITAL SIGNS BP 133/85   Pulse 74   Temp 98.2 F (36.8 C) (Oral)   Ht 5\' 5"  (1.651 m)   Wt 184 lb 9.6 oz (83.7 kg)   SpO2 93%   BMI 30.72  kg/m   Outpatient Encounter Medications as of 09/02/2020  Medication Sig  . acetaminophen (TYLENOL) 325 MG tablet Take 2 tablets (650 mg total) by mouth every 6 (six) hours as needed for mild pain, fever or headache (or Fever >/= 101).  11/02/2020 albuterol (VENTOLIN HFA) 108 (90 Base) MCG/ACT inhaler Inhale 2 puffs into the lungs every 6 (six) hours as needed for wheezing or shortness of breath.  Marland Kitchen apixaban (ELIQUIS) 5 MG TABS tablet Take 1 tablet (5 mg total) by mouth 2 (two) times daily.  Marland Kitchen ascorbic acid (VITAMIN C) 500 MG tablet Take 1,000 mg by mouth daily.  Marland Kitchen Peru-Castor Oil (VENELEX) OINT Apply 1 application topically in the morning, at noon, and at bedtime.  . carboxymethylcellul-glycerin (OPTIVE) 0.5-0.9 % ophthalmic solution Place 1 drop into both eyes 4 (four) times daily. For dry eyes  . gabapentin (NEURONTIN) 100 MG capsule Take 1 capsule (100 mg total) by mouth at bedtime.  . metoprolol succinate (TOPROL-XL) 25 MG 24 hr tablet Take 0.5 tablets (12.5 mg total) by mouth daily.  . NON FORMULARY BiPap at setting of 20/16 while sleeping. Twice A Day  . NON FORMULARY Diet: _____ Regular, __x____ NAS, _______Consistent Carbohydrate, _______NPO _____Other  . Omega-3 Fatty Acids (FISH OIL BURP-LESS PO) Take 2 capsules by mouth daily.  . OXYGEN Inhale 2 L into the lungs continuous.   . pantoprazole (PROTONIX) 40 MG tablet Take 1 tablet (40 mg total) by mouth daily.  . polyethylene glycol (MIRALAX / GLYCOLAX) 17 g packet Take 17 g by mouth daily as needed.   . rosuvastatin (CRESTOR) 10 MG tablet Take 1 tablet (10 mg total) by mouth in the morning.  . senna-docusate (SENOKOT-S) 8.6-50 MG tablet Take 2 tablets by mouth at bedtime.  . sodium chloride (OCEAN) 0.65 % SOLN nasal spray Place 2 sprays into both nostrils every 6 (six) hours as needed (For Dry Stuffy Nose).  . tamsulosin (FLOMAX) 0.4 MG CAPS capsule Take 1 capsule (0.4 mg total) by mouth daily after supper.  . [DISCONTINUED] feeding  supplement, ENSURE ENLIVE, (ENSURE ENLIVE) LIQD Take 237 mLs by mouth 2 (two) times daily between meals.   No facility-administered encounter medications on file as of 09/02/2020.     SIGNIFICANT DIAGNOSTIC EXAMS   PREVIOUS  05-27-20: chest x-ray:  1. No acute abnormality. 2. Stable cardiomegaly and mild chronic bronchitic changes. 3. Stable elevated right hemidiaphragm  05-27-20: ct of head: Mild atrophy. No acute abnormality   05-27-20: MRI of brain:  No acute abnormality Motion degraded study Atrophy with mild chronic microvascular ischemic change in the white matter.  05-27-20: MRI of lumbar spine:  1. Image quality degraded by moderate motion. 2. Moderate levoscoliosis lumbar spine. Multilevel degenerative changes throughout the lumbar spine with spinal and foraminal stenosis as described above. 3. Moderate spinal stenosis L2-3 with moderate subarticular and foraminal stenosis right greater than left 4. 7 mm anterolisthesis L5-S1 with possible pars defect on the left. Marked left foraminal encroachment with impingement left L5 nerveroot. 5. These results will be called to the ordering clinician or representative by the Radiologist Assistant, and communication documented in the PACS or [employer] Dashboard.  NO NEW EXAMS.    LABS REVIEWED PREVIOUS  03-08-20: wbc 18.9 hgb 16.7; hct 51.1; mcv 98.1 plt 143; glucose 100; bun 29; creat 1.28; k= 3.7; na++ 140; ca 9.4; total bili 2.9; albumin 3.2; tsh 0.492; mag 2.5 03-09-20: blood cultures: no growth 03-10-20: wbc 17.0; hgb 16.4; hct 52.2; mcv 100.4 plt 108; glucose 112; bun 29; creat 1.25; k+ 3.7; an++ 140; ca 8.9  03-13-20: wbc 13.8; hgb 15.1; hct 49.4 mcv 103.1 plt 85; glucose 94; bun 21; creat 0.87; k+ 3.7; na++ 140; ca 8.1  03-14-20: wbc 19.4; hgb 16.7; hct 52.7; mcv 101.3 plt 87; glucose 114; bun 20; creat 0.74; k+ 3.9; na++ 140; ca 8.3; liver normal albumin 2.7 03-17-20: glucose 82; bun 20; creat 0.67; k+ 3.8; na++ 138; ca 8.2 liver  normal albumin 2.3 03-18-20: wbc 10.4; hgb 15.9; hct 50.7 mcv 101.4; plt 84; glucose 105; bun 23; creat 0.70; k+ 4.3; na++ 140; ca 9.1 03-24-20: wbc 7.8; hgb 13.9; hct 43.6; mcv 101.4 plt 100; glucose 99; bun 22; creat 0.80; k+ 3.7; na++ 140; ca 8.5  05-27-20: wbc 10.3; hgb 14.1; hct 44.3; mcv 102.1 plt 199; glucose 99 bun 22; creat 0.8; k+ 3.7; na++ 140; ca 9.6 liver normal albumin 3.2 BNP 281; tsh 0.465; mag 2.0; urine culture no growth 05-30-20: wbc 13.2; hgb 13.0; hct 40.5; mcv 102.0 plt 197  07-28-20: vit D 93.39  NO NEW LABS.     Review of Systems  Constitutional: Negative for malaise/fatigue.  Respiratory: Negative for cough and shortness of breath.   Cardiovascular: Negative for chest pain, palpitations and leg swelling.  Gastrointestinal: Negative for abdominal pain, constipation and heartburn.  Musculoskeletal: Negative for back pain, joint pain and myalgias.  Skin: Negative.   Neurological: Negative for dizziness.  Psychiatric/Behavioral: The patient is not nervous/anxious.      Physical Exam Constitutional:      General: He is not in acute distress.    Appearance: He is well-developed. He is obese. He is not diaphoretic.  Neck:     Thyroid: No thyromegaly.  Cardiovascular:     Rate and Rhythm: Normal rate. Rhythm irregular.     Pulses: Normal pulses.     Heart sounds: Normal heart sounds.  Pulmonary:     Effort: Pulmonary effort is normal. No respiratory distress.     Breath sounds: Normal breath sounds.     Comments: Uses BIPAP  Abdominal:     General: Bowel sounds are normal. There is no distension.     Palpations: Abdomen is soft.     Tenderness: There is no abdominal tenderness.  Musculoskeletal:        General: Normal range of motion.     Cervical back: Neck supple.     Right lower leg: No edema.     Left lower leg: No edema.  Lymphadenopathy:     Cervical: No cervical adenopathy.  Skin:    General: Skin is warm  and dry.  Neurological:     Mental Status:  He is alert. Mental status is at baseline.  Psychiatric:        Mood and Affect: Mood normal.      ASSESSMENT/ PLAN:  TODAY  1. Lumbar radiculopathy: L/S1: is stable will continue gabapentin 100 mg nightly   2. Atrial fibrillation chronic: is stable will continue toproxl xl 12.5 mg daily for rate control and eliquis 5 mg twice daily   3. Acute on chronic heart failure with preserved ejection fraction diastolic dysfunction: is compensated will continue toprol xl 12.5 mg daily   PREVIOUS   4. OSA on CPAP is stable will continue CPAP nightly   58GERD without esophagitis: is stable will continue protonix 40 mg daily   6. Chronic constipation: is stable will continue mirslax twice daily and senna s 2 tabs daily   7. Dyslipidemia is stable will continue crestor 10 mg daily and fish oil 2 gm daily   8. Protein calorie malnutrition severe is stable albumin 3.2 will continue supplements as directed  9. BPH without urinary obstruction: is stable will continue flomax 0.4 mg daily            MD is aware of resident's narcotic use and is in agreement with current plan of care. We will attempt to wean resident as appropriate.  Synthia Innocent NP [facility]  Contact [phone] Monday through Friday 8am- 5pm  After hours call [phone]

## 2020-09-15 ENCOUNTER — Non-Acute Institutional Stay (SKILLED_NURSING_FACILITY): Payer: Medicare Other | Admitting: Adult Health

## 2020-09-15 ENCOUNTER — Encounter: Payer: Self-pay | Admitting: Adult Health

## 2020-09-15 DIAGNOSIS — E43 Unspecified severe protein-calorie malnutrition: Secondary | ICD-10-CM | POA: Diagnosis not present

## 2020-09-15 DIAGNOSIS — I872 Venous insufficiency (chronic) (peripheral): Secondary | ICD-10-CM | POA: Diagnosis not present

## 2020-09-15 DIAGNOSIS — I482 Chronic atrial fibrillation, unspecified: Secondary | ICD-10-CM

## 2020-09-15 NOTE — Progress Notes (Signed)
Location:    [facility] Room Number: 119/P Place of Service:  SNF (31)   CODE STATUS: Full Code  No Known Allergies  Chief Complaint  Patient presents with  . Acute Visit    Care Plan Meeting    HPI:  We have come together for his care plan meeting. BIMS 11/15 mood 0/30.BCAT 31/50 VPJ (verbal test of practical judgement) 9/18. His appetite is good. Weight is 177 pounds weight in June 190 pounds. There have been no recent falls. Frequently incontinent of bladder and bowel. Using BIPAP at night. He requires supervision to limited assist with adls he is being seen by ST. There are no reports of pain present. No reports of heart burn or insomnia. He continues to be followed for his chronic illnesses including: Chronic atrial fibrillation  Protein calorie malnutrition  Venous stasis dermatitis of both lower extremities  Past Medical History:  Diagnosis Date  . A-fib (HCC)   . Cellulitis   . Hemorrhoid   . Hypertension   . Prostate disorder     Past Surgical History:  Procedure Laterality Date  . BIOPSY  03/17/2020   Procedure: BIOPSY;  Surgeon: Corbin Ade, MD;  Location: [facility];  Service: Endoscopy;;  gastric rectal  . ESOPHAGEAL DILATION N/A 03/17/2020   Procedure: ESOPHAGEAL DILATION;  Surgeon: Corbin Ade, MD;  Location: [facility];  Service: Endoscopy;  Laterality: N/A;  . ESOPHAGOGASTRODUODENOSCOPY (EGD) WITH PROPOFOL N/A 03/17/2020   Procedure: ESOPHAGOGASTRODUODENOSCOPY (EGD) WITH PROPOFOL;  Surgeon: Corbin Ade, MD;  Location: [facility];  Service: Endoscopy;  Laterality: N/A;  . FLEXIBLE SIGMOIDOSCOPY N/A 03/17/2020   Procedure: FLEXIBLE SIGMOIDOSCOPY WITH PROPOFOL;  Surgeon: Corbin Ade, MD;  Location: [facility];  Service: Endoscopy;  Laterality: N/A;  . HEMORRHOID SURGERY     patient reports several hemorrhoid surgeries in the past    Social History   Socioeconomic History  . Marital status: Legally Separated     Spouse name: Not on file  . Number of children: Not on file  . Years of education: Not on file  . Highest education level: Not on file  Occupational History  . Not on file  Tobacco Use  . Smoking status: Former Games developer  . Smokeless tobacco: Never Used  Vaping Use  . Vaping Use: Never used  Substance and Sexual Activity  . Alcohol use: Yes    Comment: "very little"  . Drug use: Never  . Sexual activity: Not on file  Other Topics Concern  . Not on file  Social History Narrative   On his third marriage x 18 years. Has 3 children and several grandchildren. His son lives in [place], [place]  and is primary contact. 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 Lives with his wife.  Former smoker.       Social Determinants of Health   Financial Resource Strain:   . Difficulty of Paying Living Expenses: Not on file  Food Insecurity:   . Worried About Programme researcher, broadcasting/film/video in the Last Year: Not on file  . Ran Out of Food in the Last Year: Not on file  Transportation Needs:   . Lack of Transportation (Medical): Not on file  . Lack of Transportation (Non-Medical): Not on file  Physical Activity:   . Days of Exercise per Week: Not on file  . Minutes of Exercise per Session: Not on file  Stress:   . Feeling of  Stress : Not on file  Social Connections:   . Frequency of Communication with Friends and Family: Not on file  . Frequency of Social Gatherings with Friends and Family: Not on file  . Attends Religious Services: Not on file  . Active Member of Clubs or Organizations: Not on file  . Attends BankerClub or Organization Meetings: Not on file  . Marital Status: Not on file  Intimate Partner Violence:   . Fear of Current or Ex-Partner: Not on file  . Emotionally Abused: Not on file  . Physically Abused: Not on file  . Sexually Abused: Not on file   Family History  Problem Relation Age of Onset  . Colon cancer Neg Hx       VITAL SIGNS BP (!) 149/83   Pulse 81   Temp  98.8 F (37.1 C) (Oral)   Ht 5\' 5"  (1.651 m)   Wt 177 lb 9.6 oz (80.6 kg)   SpO2 (!) 89%   BMI 29.55 kg/m   Outpatient Encounter Medications as of 09/15/2020  Medication Sig  . acetaminophen (TYLENOL) 325 MG tablet Take 2 tablets (650 mg total) by mouth every 6 (six) hours as needed for mild pain, fever or headache (or Fever >/= 101).  Marland Kitchen. albuterol (VENTOLIN HFA) 108 (90 Base) MCG/ACT inhaler Inhale 2 puffs into the lungs every 6 (six) hours as needed for wheezing or shortness of breath.  Marland Kitchen. apixaban (ELIQUIS) 5 MG TABS tablet Take 1 tablet (5 mg total) by mouth 2 (two) times daily.  Marland Kitchen. ascorbic acid (VITAMIN C) 500 MG tablet Take 1,000 mg by mouth daily.  [name]. Balsam [place]-Castor Oil (VENELEX) OINT Apply 1 application topically in the morning, at noon, and at bedtime.  . carboxymethylcellul-glycerin (OPTIVE) 0.5-0.9 % ophthalmic solution Place 1 drop into both eyes 4 (four) times daily. For dry eyes  . gabapentin (NEURONTIN) 100 MG capsule Take 1 capsule (100 mg total) by mouth at bedtime.  . metoprolol succinate (TOPROL-XL) 25 MG 24 hr tablet Take 0.5 tablets (12.5 mg total) by mouth daily.  . NON FORMULARY BiPap at setting of 20/16 while sleeping. Twice A Day  . NON FORMULARY Diet: _____ Regular, __x____ NAS, _______Consistent Carbohydrate, _______NPO _____Other  . nutrition supplement, JUVEN, (JUVEN) PACK Take 1 packet by mouth 2 (two) times daily between meals.  . Omega-3 Fatty Acids (FISH OIL BURP-LESS PO) Take 2 capsules by mouth daily.  . OXYGEN Inhale 2 L into the lungs continuous.   . pantoprazole (PROTONIX) 40 MG tablet Take 1 tablet (40 mg total) by mouth daily.  . polyethylene glycol (MIRALAX / GLYCOLAX) 17 g packet Take 17 g by mouth daily as needed.   . rosuvastatin (CRESTOR) 10 MG tablet Take 1 tablet (10 mg total) by mouth in the morning.  . senna-docusate (SENOKOT-S) 8.6-50 MG tablet Take 2 tablets by mouth at bedtime.  . sodium chloride (OCEAN) 0.65 % SOLN nasal spray Place  2 sprays into both nostrils every 6 (six) hours as needed (For Dry Stuffy Nose).  . tamsulosin (FLOMAX) 0.4 MG CAPS capsule Take 1 capsule (0.4 mg total) by mouth daily after supper.   No facility-administered encounter medications on file as of 09/15/2020.     SIGNIFICANT DIAGNOSTIC EXAMS   PREVIOUS  05-27-20: chest x-ray:  1. No acute abnormality. 2. Stable cardiomegaly and mild chronic bronchitic changes. 3. Stable elevated right hemidiaphragm  05-27-20: ct of head: Mild atrophy. No acute abnormality   05-27-20: MRI of brain:  No acute abnormality  Motion degraded study Atrophy with mild chronic microvascular ischemic change in the white matter.  05-27-20: MRI of lumbar spine:  1. Image quality degraded by moderate motion. 2. Moderate levoscoliosis lumbar spine. Multilevel degenerative changes throughout the lumbar spine with spinal and foraminal stenosis as described above. 3. Moderate spinal stenosis L2-3 with moderate subarticular and foraminal stenosis right greater than left 4. 7 mm anterolisthesis L5-S1 with possible pars defect on the left. Marked left foraminal encroachment with impingement left L5 nerveroot. 5. These results will be called to the ordering clinician or representative by the Radiologist Assistant, and communication documented in the PACS or [employer].  NO NEW EXAMS.    LABS REVIEWED PREVIOUS  03-08-20: wbc 18.9 hgb 16.7; hct 51.1; mcv 98.1 plt 143; glucose 100; bun 29; creat 1.28; k= 3.7; na++ 140; ca 9.4; total bili 2.9; albumin 3.2; tsh 0.492; mag 2.5 03-09-20: blood cultures: no growth 03-10-20: wbc 17.0; hgb 16.4; hct 52.2; mcv 100.4 plt 108; glucose 112; bun 29; creat 1.25; k+ 3.7; an++ 140; ca 8.9  03-13-20: wbc 13.8; hgb 15.1; hct 49.4 mcv 103.1 plt 85; glucose 94; bun 21; creat 0.87; k+ 3.7; na++ 140; ca 8.1  03-14-20: wbc 19.4; hgb 16.7; hct 52.7; mcv 101.3 plt 87; glucose 114; bun 20; creat 0.74; k+ 3.9; na++ 140; ca 8.3; liver normal albumin  2.7 03-17-20: glucose 82; bun 20; creat 0.67; k+ 3.8; na++ 138; ca 8.2 liver normal albumin 2.3 03-18-20: wbc 10.4; hgb 15.9; hct 50.7 mcv 101.4; plt 84; glucose 105; bun 23; creat 0.70; k+ 4.3; na++ 140; ca 9.1 03-24-20: wbc 7.8; hgb 13.9; hct 43.6; mcv 101.4 plt 100; glucose 99; bun 22; creat 0.80; k+ 3.7; na++ 140; ca 8.5  05-27-20: wbc 10.3; hgb 14.1; hct 44.3; mcv 102.1 plt 199; glucose 99 bun 22; creat 0.8; k+ 3.7; na++ 140; ca 9.6 liver normal albumin 3.2 BNP 281; tsh 0.465; mag 2.0; urine culture no growth 05-30-20: wbc 13.2; hgb 13.0; hct 40.5; mcv 102.0 plt 197  07-28-20: vit D 93.39  NO NEW LABS.    Review of Systems  Constitutional: Negative for malaise/fatigue.  Respiratory: Negative for cough and shortness of breath.   Cardiovascular: Negative for chest pain, palpitations and leg swelling.  Gastrointestinal: Negative for abdominal pain, constipation and heartburn.  Musculoskeletal: Negative for back pain, joint pain and myalgias.  Skin: Negative.   Neurological: Negative for dizziness.  Psychiatric/Behavioral: The patient is not nervous/anxious.     Physical Exam Constitutional:      General: He is not in acute distress.    Appearance: He is well-developed. He is obese. He is not diaphoretic.  Neck:     Thyroid: No thyromegaly.  Cardiovascular:     Rate and Rhythm: Normal rate. Rhythm irregular.     Pulses: Normal pulses.     Heart sounds: Normal heart sounds.  Pulmonary:     Effort: Pulmonary effort is normal. No respiratory distress.     Breath sounds: Normal breath sounds.     Comments: Uses BIPAP  Abdominal:     General: Bowel sounds are normal. There is no distension.     Palpations: Abdomen is soft.     Tenderness: There is no abdominal tenderness.  Musculoskeletal:        General: Normal range of motion.     Cervical back: Neck supple.     Right lower leg: No edema.     Left lower leg: No edema.  Lymphadenopathy:  Cervical: No cervical adenopathy.   Skin:    General: Skin is warm and dry.     Findings: No lesion.     Comments: Right shin venous ulcer: 0.4 x 0.4 cm Left shin venous ulcer: 0.4 x 0.4 cm  Neurological:     Mental Status: He is alert. Mental status is at baseline.  Psychiatric:        Mood and Affect: Mood normal.       ASSESSMENT/ PLAN:  TODAY  1. Chronic atrial fibrillation 2. Protein calorie malnutrition 3. Venous stasis dermatitis of both lower extremities  Will stop juven Will begin ensure twice daily  Will continue current plan of care  Will continue to monitor his status.   MD is aware of resident's narcotic use and is in agreement with current plan of care. We will attempt to wean resident as appropriate.  Synthia Innocent NP [facility]  Contact [phone] Monday through Friday 8am- 5pm  After hours call [phone]

## 2020-09-21 ENCOUNTER — Other Ambulatory Visit: Payer: Self-pay

## 2020-09-21 DIAGNOSIS — I739 Peripheral vascular disease, unspecified: Secondary | ICD-10-CM

## 2020-10-03 ENCOUNTER — Ambulatory Visit (HOSPITAL_COMMUNITY)
Admission: RE | Admit: 2020-10-03 | Discharge: 2020-10-03 | Disposition: A | Discharge status: Home / Self Care | Payer: Medicare Other | Source: Ambulatory Visit | Attending: Surgery | Admitting: Surgery

## 2020-10-03 ENCOUNTER — Encounter: Payer: Self-pay | Admitting: Surgery

## 2020-10-03 ENCOUNTER — Ambulatory Visit (INDEPENDENT_AMBULATORY_CARE_PROVIDER_SITE_OTHER): Payer: Medicare Other | Admitting: Surgery

## 2020-10-03 VITALS — BP 124/75 | HR 79 | Temp 98.0°F | Resp 18 | Ht 65.0 in | Wt 176.0 lb

## 2020-10-03 DIAGNOSIS — I739 Peripheral vascular disease, unspecified: Secondary | ICD-10-CM | POA: Insufficient documentation

## 2020-10-03 DIAGNOSIS — L97929 Non-pressure chronic ulcer of unspecified part of left lower leg with unspecified severity: Secondary | ICD-10-CM

## 2020-10-03 DIAGNOSIS — I87313 Chronic venous hypertension (idiopathic) with ulcer of bilateral lower extremity: Secondary | ICD-10-CM

## 2020-10-03 DIAGNOSIS — L97919 Non-pressure chronic ulcer of unspecified part of right lower leg with unspecified severity: Secondary | ICD-10-CM

## 2020-10-03 NOTE — Progress Notes (Signed)
[facility]ist [facility]  Patient name: Jesus Fowler MRN: [id number] DOB: April 29, 1933 Sex: male   REQUESTING PROVIDER:    Dr. Alwyn Ren    REASON FOR CONSULT:    PAD  HISTORY OF PRESENT ILLNESS:   Jesus Fowler is a 86 y.o. male, with history of atrial fibrillation on Eliquis, hypertension, sleep apnea who has chronic lower extremity swelling.  There was concern for vascular insufficiency from an ultrasound that showed noncompressible waveforms.  He is referred for further evaluation.  He states that his legs will get very swollen.  He is currently in route to rehab center at [facility].  PAST MEDICAL HISTORY    Past Medical History:  Diagnosis Date  . A-fib (HCC)   . Cellulitis   . Hemorrhoid   . Hypertension   . Prostate disorder      FAMILY HISTORY   Family History  Problem Relation Age of Onset  . Colon cancer Neg Hx     SOCIAL HISTORY:   Social History   Socioeconomic History  . Marital status: Legally Separated    Spouse name: Not on file  . Number of children: Not on file  . Years of education: Not on file  . Highest education level: Not on file  Occupational History  . Not on file  Tobacco Use  . Smoking status: Former Games developer  . Smokeless tobacco: Never Used  Vaping Use  . Vaping Use: Never used  Substance and Sexual Activity  . Alcohol use: Yes    Comment: "very little"  . Drug use: Never  . Sexual activity: Not on file  Other Topics Concern  . Not on file  Social History Narrative   On his third marriage x 18 years. Has 3 children and several grandchildren. His son lives in [place], [place]  and is primary contact. 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  Former smoker.       Social Determinants of Health   Financial Resource Strain:   . Difficulty of Paying Living Expenses: Not on file  Food Insecurity:   . Worried About Programme researcher, broadcasting/film/video in the  Last Year: Not on file  . Ran Out of Food in the Last Year: Not on file  Transportation Needs:   . Lack of Transportation (Medical): Not on file  . Lack of Transportation (Non-Medical): Not on file  Physical Activity:   . Days of Exercise per Week: Not on file  . Minutes of Exercise per Session: Not on file  Stress:   . Feeling of Stress : Not on file  Social Connections:   . Frequency of Communication with Friends and Family: Not on file  . Frequency of Social Gatherings with Friends and Family: Not on file  . Attends Religious Services: Not on file  . Active Member of Clubs or Organizations: Not on file  . Attends Banker Meetings: Not on file  . Marital Status: Not on file  Intimate Partner Violence:   . Fear of Current or Ex-Partner: Not on file  . Emotionally Abused: Not on file  . Physically Abused: Not on file  . Sexually Abused: Not on file    ALLERGIES:    No Known Allergies  CURRENT MEDICATIONS:    No current outpatient medications on file.   No current facility-administered medications for this visit.    REVIEW OF SYSTEMS:   [X]  denotes positive finding, [ ]  denotes  negative finding Cardiac  Comments:  Chest pain or chest pressure:    Shortness of breath upon exertion:    Short of breath when lying flat:    Irregular heart rhythm:        Vascular    Pain in calf, thigh, or hip brought on by ambulation:    Pain in feet at night that wakes you up from your sleep:     Blood clot in your veins:    Leg swelling:  x       Pulmonary    Oxygen at home:    Productive cough:     Wheezing:         Neurologic    Sudden weakness in arms or legs:     Sudden numbness in arms or legs:     Sudden onset of difficulty speaking or slurred speech:    Temporary loss of vision in one eye:     Problems with dizziness:         Gastrointestinal    Blood in stool:      Vomited blood:         Genitourinary    Burning when urinating:     Blood in  urine:        Psychiatric    Major depression:         Hematologic    Bleeding problems:    Problems with blood clotting too easily:        Skin    Rashes or ulcers:        Constitutional    Fever or chills:     PHYSICAL EXAM:   Vitals:   10/03/20 1145  BP: 124/75  Pulse: 79  Resp: 18  Temp: 98 F (36.7 C)  TempSrc: Temporal  Weight: 176 lb (79.8 kg)  Height: 5\' 5"  (1.651 m)    GENERAL: The patient is a well-nourished male, in no acute distress. The vital signs are documented above. CARDIAC: There is a regular rate and rhythm.  VASCULAR: Palpable pedal pulses bilaterally. PULMONARY: Nonlabored respirations ABDOMEN: Soft and non-tender with normal pitched bowel sounds.  MUSCULOSKELETAL: There are no major deformities or cyanosis. NEUROLOGIC: No focal weakness or paresthesias are detected. SKIN: Chronic skin changes with superficial ulceration of the right knee PSYCHIATRIC: The patient has a normal affect.  STUDIES:   I have reviewed the following: Right toe pressure:  88 Left toe pressure:  78  Arteries are non-compressible, but wave forms are triphasic  ASSESSMENT and PLAN   Bilateral lower extremity wounds: The patient's ultrasound today along with physical exam did not point towards a vascular etiology for his wounds.  This appears to be secondary to chronic edema.  In addition the skin on his legs is very dry and has created significant scaling.  I think the best option at this time is to place him in Unna boots which will help with his edema as well as moisturizing the skin.  I have him scheduled for follow-up wound check in 6 weeks.   , MD, FACS [facility] [phone] Pager [phone]

## 2020-10-04 ENCOUNTER — Non-Acute Institutional Stay (SKILLED_NURSING_FACILITY): Payer: Medicare Other | Admitting: Adult Health

## 2020-10-04 ENCOUNTER — Encounter: Payer: Self-pay | Admitting: Adult Health

## 2020-10-04 DIAGNOSIS — G4733 Obstructive sleep apnea (adult) (pediatric): Secondary | ICD-10-CM

## 2020-10-04 DIAGNOSIS — Z9989 Dependence on other enabling machines and devices: Secondary | ICD-10-CM | POA: Diagnosis not present

## 2020-10-04 DIAGNOSIS — K219 Gastro-esophageal reflux disease without esophagitis: Secondary | ICD-10-CM

## 2020-10-04 DIAGNOSIS — K5909 Other constipation: Secondary | ICD-10-CM

## 2020-10-04 NOTE — Progress Notes (Signed)
Location:    [facility] Room Number: 119/P Place of Service:  SNF (31)   CODE STATUS: Full Code  No Known Allergies  Chief Complaint  Patient presents with  . Medical Management of Chronic Issues          OSA on BIPAP    GERD without esophagitis:  Chronic constipation    HPI:  He is a 86 year old long term resident of this facility being seen for the management of his chronic illnesses: OSA on BIPAP    GERD without esophagitis:  Chronic constipation. There are no reports of constipation; heart burn; or uncontrolled pain. His legs are presently wrapped in unna boots.   Past Medical History:  Diagnosis Date  . A-fib (HCC)   . Cellulitis   . Hemorrhoid   . Hypertension   . Prostate disorder     Past Surgical History:  Procedure Laterality Date  . BIOPSY  03/17/2020   Procedure: BIOPSY;  Surgeon: Corbin Ade, MD;  [facility];  Service: Endoscopy;;  gastric rectal  . ESOPHAGEAL DILATION N/A 03/17/2020   Procedure: ESOPHAGEAL DILATION;  Surgeon: Corbin Ade, MD;  [facility];  Service: Endoscopy;  Laterality: N/A;  . ESOPHAGOGASTRODUODENOSCOPY (EGD) WITH PROPOFOL N/A 03/17/2020   Procedure: ESOPHAGOGASTRODUODENOSCOPY (EGD) WITH PROPOFOL;  Surgeon: Corbin Ade, MD;  [facility];  Service: Endoscopy;  Laterality: N/A;  . FLEXIBLE SIGMOIDOSCOPY N/A 03/17/2020   Procedure: FLEXIBLE SIGMOIDOSCOPY WITH PROPOFOL;  Surgeon: Corbin Ade, MD;  [facility];  Service: Endoscopy;  Laterality: N/A;  . HEMORRHOID SURGERY     patient reports several hemorrhoid surgeries in the past    Social History   Socioeconomic History  . Marital status: Legally Separated    Spouse name: Not on file  . Number of children: Not on file  . Years of education: Not on file  . Highest education level: Not on file  Occupational History  . Not on file  Tobacco Use  . Smoking status: Former Games developer  . Smokeless tobacco:  Never Used  Vaping Use  . Vaping Use: Never used  Substance and Sexual Activity  . Alcohol use: Yes    Comment: "very little"  . Drug use: Never  . Sexual activity: Not on file  Other Topics Concern  . Not on file  Social History Narrative   On his third marriage x 18 years. Has 3 children and several grandchildren. His son lives in [place], [place]  and is primary contact. 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 Lives with his wife.  Former smoker.       Social Determinants of Health   Financial Resource Strain:   . Difficulty of Paying Living Expenses: Not on file  Food Insecurity:   . Worried About Programme researcher, broadcasting/film/video in the Last Year: Not on file  . Ran Out of Food in the Last Year: Not on file  Transportation Needs:   . Lack of Transportation (Medical): Not on file  . Lack of Transportation (Non-Medical): Not on file  Physical Activity:   . Days of Exercise per Week: Not on file  . Minutes of Exercise per Session: Not on file  Stress:   . Feeling of Stress : Not on file  Social Connections:   . Frequency of Communication with Friends and Family: Not on file  . Frequency of Social Gatherings with Friends and Family: Not  on file  . Attends Religious Services: Not on file  . Active Member of Clubs or Organizations: Not on file  . Attends Banker Meetings: Not on file  . Marital Status: Not on file  Intimate Partner Violence:   . Fear of Current or Ex-Partner: Not on file  . Emotionally Abused: Not on file  . Physically Abused: Not on file  . Sexually Abused: Not on file   Family History  Problem Relation Age of Onset  . Colon cancer Neg Hx       VITAL SIGNS BP (!) 142/82   Pulse 82   Temp 98.3 F (36.8 C)   Ht 5\' 5"  (1.651 m)   Wt 176 lb 3.2 oz (79.9 kg)   SpO2 93%   BMI 29.32 kg/m   Outpatient Encounter Medications as of 10/04/2020  Medication Sig  . acetaminophen (TYLENOL) 325 MG tablet Take 2 tablets (650 mg total) by mouth  every 6 (six) hours as needed for mild pain, fever or headache (or Fever >/= 101).  12/04/2020 albuterol (VENTOLIN HFA) 108 (90 Base) MCG/ACT inhaler Inhale 2 puffs into the lungs every 6 (six) hours as needed for wheezing or shortness of breath.  Marland Kitchen apixaban (ELIQUIS) 5 MG TABS tablet Take 1 tablet (5 mg total) by mouth 2 (two) times daily.  Marland Kitchen ascorbic acid (VITAMIN C) 500 MG tablet Take 1,000 mg by mouth daily.  Marland Kitchen Peru-Castor Oil (VENELEX) OINT Apply 1 application topically in the morning, at noon, and at bedtime.  . carboxymethylcellul-glycerin (OPTIVE) 0.5-0.9 % ophthalmic solution Place 1 drop into both eyes 4 (four) times daily. For dry eyes  . feeding supplement, ENSURE ENLIVE, (ENSURE ENLIVE) LIQD Take 237 mLs by mouth daily. To promote weight maintenance  . gabapentin (NEURONTIN) 100 MG capsule Take 1 capsule (100 mg total) by mouth at bedtime.  . metoprolol succinate (TOPROL-XL) 25 MG 24 hr tablet Take 0.5 tablets (12.5 mg total) by mouth daily.  . NON FORMULARY BiPap at setting of 20/16 while sleeping. Twice A Day  . NON FORMULARY Diet: _____ Regular, __x____ NAS, _______Consistent Carbohydrate, _______NPO _____Other  . nutrition supplement, JUVEN, (JUVEN) PACK Take 1 packet by mouth 2 (two) times daily between meals.  . Omega-3 Fatty Acids (FISH OIL BURP-LESS PO) Take 2 capsules by mouth daily.  . OXYGEN Inhale 2 L into the lungs continuous.   . pantoprazole (PROTONIX) 40 MG tablet Take 1 tablet (40 mg total) by mouth daily.  . polyethylene glycol (MIRALAX / GLYCOLAX) 17 g packet Take 17 g by mouth daily as needed.   . rosuvastatin (CRESTOR) 10 MG tablet Take 1 tablet (10 mg total) by mouth in the morning.  . senna-docusate (SENOKOT-S) 8.6-50 MG tablet Take 2 tablets by mouth at bedtime.  . sodium chloride (OCEAN) 0.65 % SOLN nasal spray Place 2 sprays into both nostrils every 6 (six) hours as needed (For Dry Stuffy Nose).  . tamsulosin (FLOMAX) 0.4 MG CAPS capsule Take 1 capsule  (0.4 mg total) by mouth daily after supper.   No facility-administered encounter medications on file as of 10/04/2020.     SIGNIFICANT DIAGNOSTIC EXAMS   PREVIOUS  05-27-20: chest x-ray:  1. No acute abnormality. 2. Stable cardiomegaly and mild chronic bronchitic changes. 3. Stable elevated right hemidiaphragm  05-27-20: ct of head: Mild atrophy. No acute abnormality   05-27-20: MRI of brain:  No acute abnormality Motion degraded study Atrophy with mild chronic microvascular ischemic change in the white matter.  05-27-20: MRI of lumbar spine:  1. Image quality degraded by moderate motion. 2. Moderate levoscoliosis lumbar spine. Multilevel degenerative changes throughout the lumbar spine with spinal and foraminal stenosis as described above. 3. Moderate spinal stenosis L2-3 with moderate subarticular and foraminal stenosis right greater than left 4. 7 mm anterolisthesis L5-S1 with possible pars defect on the left. Marked left foraminal encroachment with impingement left L5 nerveroot. 5. These results will be called to the ordering clinician or representative by the Radiologist Assistant, and communication documented in the PACS or [employer].  NO NEW EXAMS.    LABS REVIEWED PREVIOUS  03-08-20: wbc 18.9 hgb 16.7; hct 51.1; mcv 98.1 plt 143; glucose 100; bun 29; creat 1.28; k= 3.7; na++ 140; ca 9.4; total bili 2.9; albumin 3.2; tsh 0.492; mag 2.5 03-09-20: blood cultures: no growth 03-10-20: wbc 17.0; hgb 16.4; hct 52.2; mcv 100.4 plt 108; glucose 112; bun 29; creat 1.25; k+ 3.7; an++ 140; ca 8.9  03-13-20: wbc 13.8; hgb 15.1; hct 49.4 mcv 103.1 plt 85; glucose 94; bun 21; creat 0.87; k+ 3.7; na++ 140; ca 8.1  03-14-20: wbc 19.4; hgb 16.7; hct 52.7; mcv 101.3 plt 87; glucose 114; bun 20; creat 0.74; k+ 3.9; na++ 140; ca 8.3; liver normal albumin 2.7 03-17-20: glucose 82; bun 20; creat 0.67; k+ 3.8; na++ 138; ca 8.2 liver normal albumin 2.3 03-18-20: wbc 10.4; hgb 15.9; hct 50.7 mcv  101.4; plt 84; glucose 105; bun 23; creat 0.70; k+ 4.3; na++ 140; ca 9.1 03-24-20: wbc 7.8; hgb 13.9; hct 43.6; mcv 101.4 plt 100; glucose 99; bun 22; creat 0.80; k+ 3.7; na++ 140; ca 8.5  05-27-20: wbc 10.3; hgb 14.1; hct 44.3; mcv 102.1 plt 199; glucose 99 bun 22; creat 0.8; k+ 3.7; na++ 140; ca 9.6 liver normal albumin 3.2 BNP 281; tsh 0.465; mag 2.0; urine culture no growth 05-30-20: wbc 13.2; hgb 13.0; hct 40.5; mcv 102.0 plt 197  07-28-20: vit D 93.39  NO NEW LABS.     Review of Systems  Constitutional: Negative for malaise/fatigue.  Respiratory: Negative for cough and shortness of breath.   Cardiovascular: Negative for chest pain, palpitations and leg swelling.  Gastrointestinal: Negative for abdominal pain, constipation and heartburn.  Musculoskeletal: Negative for back pain, joint pain and myalgias.  Skin: Negative.   Neurological: Negative for dizziness.  Psychiatric/Behavioral: The patient is not nervous/anxious.      Physical Exam Constitutional:      General: He is not in acute distress.    Appearance: He is well-developed. He is not diaphoretic.  Neck:     Thyroid: No thyromegaly.  Cardiovascular:     Rate and Rhythm: Normal rate. Rhythm irregular.     Pulses: Normal pulses.     Heart sounds: Normal heart sounds.  Pulmonary:     Effort: Pulmonary effort is normal. No respiratory distress.     Breath sounds: Normal breath sounds.     Comments: Uses BIPAP Abdominal:     General: Bowel sounds are normal. There is no distension.     Palpations: Abdomen is soft.     Tenderness: There is no abdominal tenderness.  Musculoskeletal:        General: Normal range of motion.     Cervical back: Neck supple.     Right lower leg: No edema.     Left lower leg: No edema.  Lymphadenopathy:     Cervical: No cervical adenopathy.  Skin:    General: Skin is warm and dry.  Comments: Unna boots in place bilaterally   Neurological:     Mental Status: He is alert. Mental status  is at baseline.  Psychiatric:        Mood and Affect: Mood normal.      ASSESSMENT/ PLAN:  TODAY  1. OSA on BIPAP is stable will continue BIPAP nightly   2. GERD without esophagitis: is stable will continue protonix 40 mg daily   3. Chronic constipation: is stable will continue miralax twice daily and senna s 2 tabs daily   PREVIOUS  4. Dyslipidemia is stable will continue crestor 10 mg daily and fish oil 2 gm daily   5. Protein calorie malnutrition severe is stable albumin 3.2 will continue supplements as directed  6. BPH without urinary obstruction: is stable will continue flomax 0.4 mg daily   7. Lumbar radiculopathy: L5/S1: is stable will continue gabapentin 100 mg nightly   8. Atrial fibrillation chronic: is stable will continue toproxl xl 12.5 mg daily for rate control and eliquis 5 mg twice daily   9. Acute on chronic heart failure with preserved ejection fraction diastolic dysfunction: is compensated will continue toprol xl 12.5 mg daily         MD is aware of resident's narcotic use and is in agreement with current plan of care. We will attempt to wean resident as appropriate.  Synthia Innocenteborah Kelly Eisler NP [facility]  Contact [phone] Monday through Friday 8am- 5pm  After hours call [phone]

## 2020-10-06 ENCOUNTER — Ambulatory Visit (INDEPENDENT_AMBULATORY_CARE_PROVIDER_SITE_OTHER): Payer: Medicare Other | Admitting: Physician Assistant

## 2020-10-06 VITALS — BP 109/59 | HR 83 | Temp 98.5°F | Resp 20

## 2020-10-06 DIAGNOSIS — L97929 Non-pressure chronic ulcer of unspecified part of left lower leg with unspecified severity: Secondary | ICD-10-CM

## 2020-10-06 DIAGNOSIS — I872 Venous insufficiency (chronic) (peripheral): Secondary | ICD-10-CM | POA: Diagnosis not present

## 2020-10-06 DIAGNOSIS — I87313 Chronic venous hypertension (idiopathic) with ulcer of bilateral lower extremity: Secondary | ICD-10-CM | POA: Diagnosis not present

## 2020-10-06 DIAGNOSIS — L97919 Non-pressure chronic ulcer of unspecified part of right lower leg with unspecified severity: Secondary | ICD-10-CM | POA: Diagnosis not present

## 2020-10-06 NOTE — Progress Notes (Signed)
  POST OPERATIVE OFFICE NOTE    CC:  F/u for Unna boot  HPI:  This is a 86 y.o. male who was evaluated by Dr. Myra Gianotti on Monday.  He appeared to have chronic lower extremity edema with associated skin changes.  Unna boots were placed.  The patient states he feels improvement in his lower extremities.  He ambulates via wheelchair.  He is a resident at [facility] nursing [facility].  No Known Allergies  No current outpatient medications on file.   No current facility-administered medications for this visit.     ROS:  See HPI  BP (!) 109/59 (BP Location: Right Arm, Patient Position: Sitting, Cuff Size: Normal)   Pulse 83   Temp 98.5 F (36.9 C) (Temporal)   Resp 20   SpO2 93%   Physical Exam:  General appearance: Chronically ill-appearing male in no apparent distress Cardiac: Irregular rhythm Respiratory: Nonlabored Extremities: Bilateral lower extremities with no appreciable edema.  Chronic, thickened, scaling skin of the lower extremities.  Small superficial skin breakdown of the right proximal anterior shin area.  Skin breakdown of approximately less than 1/2 cm of left lateral malleolus. 2+ left DP pulse. Unable to palpate right pedal pulses. Neuro: Flat affect  LEFT    RIGHT    LEFT    Assessment/Plan:  This is a 86 y.o. male chronic lower extremity edema and associated skin changes.  No indication of acute arterial insufficiency.  Continue Unna boot dressing changes weekly.  Wendi Maya, PA-C [facility] [phone]  Clinic MD:  Darrick Penna

## 2020-10-10 ENCOUNTER — Other Ambulatory Visit: Payer: Self-pay | Admitting: *Deleted

## 2020-10-10 DIAGNOSIS — I87313 Chronic venous hypertension (idiopathic) with ulcer of bilateral lower extremity: Secondary | ICD-10-CM

## 2020-10-10 DIAGNOSIS — L97919 Non-pressure chronic ulcer of unspecified part of right lower leg with unspecified severity: Secondary | ICD-10-CM

## 2020-10-13 ENCOUNTER — Ambulatory Visit (INDEPENDENT_AMBULATORY_CARE_PROVIDER_SITE_OTHER): Payer: Medicare Other | Admitting: Physician Assistant

## 2020-10-13 ENCOUNTER — Encounter: Payer: Self-pay | Admitting: Adult Health

## 2020-10-13 VITALS — BP 111/60 | HR 78 | Temp 97.9°F | Resp 18 | Wt 176.0 lb

## 2020-10-13 DIAGNOSIS — L97919 Non-pressure chronic ulcer of unspecified part of right lower leg with unspecified severity: Secondary | ICD-10-CM

## 2020-10-13 DIAGNOSIS — L97929 Non-pressure chronic ulcer of unspecified part of left lower leg with unspecified severity: Secondary | ICD-10-CM | POA: Diagnosis not present

## 2020-10-13 DIAGNOSIS — I87313 Chronic venous hypertension (idiopathic) with ulcer of bilateral lower extremity: Secondary | ICD-10-CM | POA: Diagnosis not present

## 2020-10-13 DIAGNOSIS — R4189 Other symptoms and signs involving cognitive functions and awareness: Secondary | ICD-10-CM | POA: Insufficient documentation

## 2020-10-13 NOTE — Progress Notes (Signed)
   Patient ID: Jesus Fowler, male   DOB: 1933-11-03, 86 y.o.   MRN: [id number]  Reason for Consult: Dressing Change   Referred by [name], MD  Subjective:     HPI:  Jesus Fowler is a 86 y.o. male who presents from [place] resident home for [employer] change.  He denies lower extremity pain today.  Past Medical History:  Diagnosis Date  . A-fib (HCC)   . Cellulitis   . Hemorrhoid   . Hypertension   . Prostate disorder    Family History  Problem Relation Age of Onset  . Colon cancer Neg Hx    Past Surgical History:  Procedure Laterality Date  . BIOPSY  03/17/2020   Procedure: BIOPSY;  Surgeon: Corbin Ade, MD;  [facility];  Service: Endoscopy;;  gastric rectal  . ESOPHAGEAL DILATION N/A 03/17/2020   Procedure: ESOPHAGEAL DILATION;  Surgeon: Corbin Ade, MD;  [facility];  Service: Endoscopy;  Laterality: N/A;  . ESOPHAGOGASTRODUODENOSCOPY (EGD) WITH PROPOFOL N/A 03/17/2020   Procedure: ESOPHAGOGASTRODUODENOSCOPY (EGD) WITH PROPOFOL;  Surgeon: Corbin Ade, MD;  [facility];  Service: Endoscopy;  Laterality: N/A;  . FLEXIBLE SIGMOIDOSCOPY N/A 03/17/2020   Procedure: FLEXIBLE SIGMOIDOSCOPY WITH PROPOFOL;  Surgeon: Corbin Ade, MD;  [facility];  Service: Endoscopy;  Laterality: N/A;  . HEMORRHOID SURGERY     patient reports several hemorrhoid surgeries in the past    Short Social History:  Social History   Tobacco Use  . Smoking status: Former Games developer  . Smokeless tobacco: Never Used  Substance Use Topics  . Alcohol use: Yes    Comment: "very little"    No Known Allergies  No current outpatient medications on file.   No current facility-administered medications for this visit.    REVIEW OF SYSTEMS      Objective:  Objective   Vitals:   10/13/20 1510  BP: 111/60  Pulse: 78  Resp: 18  Temp: 97.9 F (36.6 C)  SpO2: 95%  Weight: 176 lb (79.8 kg)   Body mass index is 29.29  kg/m.  Physical Exam   Bilateral lower extremities with no edema.  Chronic, dry thickened scaly skin of lower extremities.  No ulceration.  Feet are warm.  Active range of motion and intact sensation.      Assessment/Plan:     Bilateral lower extremity stasis edema with chronic skin changes.  No ulcerations.  No edema.  [facility] resident home to do bilateral lower extremity Unna boot dressings once weekly.  He has a follow-up appoint with Dr. Myra Gianotti arranged.    Clininc: Dr. Darletta Moll Pa  [facility]

## 2020-10-28 ENCOUNTER — Encounter: Payer: Self-pay | Admitting: Adult Health

## 2020-10-28 DIAGNOSIS — Z0000 Encounter for general adult medical examination without abnormal findings: Secondary | ICD-10-CM | POA: Insufficient documentation

## 2020-10-28 NOTE — Progress Notes (Signed)
Subjective:   Jesus Fowler is a 86 y.o. male who presents for Medicare Annual/Subsequent preventive examination.  Review of Systems    Review of Systems  Constitutional: Negative for malaise/fatigue.  Respiratory: Negative for cough and shortness of breath.   Cardiovascular: Negative for chest pain, palpitations and leg swelling.  Gastrointestinal: Negative for abdominal pain, constipation and heartburn.  Musculoskeletal: Negative for back pain, joint pain and myalgias.  Skin: Negative.   Neurological: Negative for dizziness.  Psychiatric/Behavioral: The patient is not nervous/anxious.    Cardiac Risk Factors include: advanced age (>37men, >20 women);sedentary lifestyle     Objective:    Today's Vitals   10/28/20 1508 10/28/20 1518  BP: 138/78   Pulse: 80   Temp: 97.8 F (36.6 C)   Weight: 176 lb (79.8 kg)   Height: 5\' 5"  (1.651 m)   PainSc:  0-No pain   Body mass index is 29.29 kg/m.  Advanced Directives 10/04/2020 09/15/2020 09/02/2020 08/02/2020 07/01/2020 06/23/2020 06/16/2020  Does Patient Have a Medical Advance Directive? Yes Yes - Yes Yes Yes Yes  Type of Advance Directive - (No Data) (No Data) (No Data) (No Data) (No Data) (No Data)  Does patient want to make changes to medical advance directive? No - Patient declined No - Patient declined No - Patient declined No - Patient declined No - Patient declined No - Patient declined No - Patient declined  Copy of Healthcare Power of Attorney in Chart? - - - - - - -  Would patient like information on creating a medical advance directive? - - - - - - -    Current Medications (verified) Outpatient Encounter Medications as of 10/28/2020  Medication Sig  . NON FORMULARY Diet: _____ Regular, __x____ NAS, _______Consistent Carbohydrate, _______NPO _____Other  . OXYGEN Inhale 2 L into the lungs continuous.   10/30/2020 acetaminophen (TYLENOL) 325 MG tablet Take 2 tablets (650 mg total) by mouth every 6 (six) hours as needed for mild  pain, fever or headache (or Fever >/= 101).  Marland Kitchen albuterol (VENTOLIN HFA) 108 (90 Base) MCG/ACT inhaler Inhale 2 puffs into the lungs every 6 (six) hours as needed for wheezing or shortness of breath.  Marland Kitchen apixaban (ELIQUIS) 5 MG TABS tablet Take 1 tablet (5 mg total) by mouth 2 (two) times daily.  Marland Kitchen ascorbic acid (VITAMIN C) 500 MG tablet Take 1,000 mg by mouth daily.  Marland Kitchen Peru-Castor Oil (VENELEX) OINT Apply 1 application topically in the morning, at noon, and at bedtime.  . carboxymethylcellul-glycerin (OPTIVE) 0.5-0.9 % ophthalmic solution Place 1 drop into both eyes 4 (four) times daily. For dry eyes  . feeding supplement, ENSURE ENLIVE, (ENSURE ENLIVE) LIQD Take 237 mLs by mouth daily. To promote weight maintenance  . gabapentin (NEURONTIN) 100 MG capsule Take 1 capsule (100 mg total) by mouth at bedtime.  . metoprolol succinate (TOPROL-XL) 25 MG 24 hr tablet Take 0.5 tablets (12.5 mg total) by mouth daily.  . NON FORMULARY BiPap at setting of 20/16 while sleeping. Twice A Day  . nutrition supplement, JUVEN, (JUVEN) PACK Take 1 packet by mouth 2 (two) times daily between meals.  . Omega-3 Fatty Acids (FISH OIL BURP-LESS PO) Take 2 capsules by mouth daily.  . pantoprazole (PROTONIX) 40 MG tablet Take 1 tablet (40 mg total) by mouth daily.  . polyethylene glycol (MIRALAX / GLYCOLAX) 17 g packet Take 17 g by mouth daily as needed.   . rosuvastatin (CRESTOR) 10 MG tablet Take 1 tablet (10 mg total) by  mouth in the morning.  . senna-docusate (SENOKOT-S) 8.6-50 MG tablet Take 2 tablets by mouth at bedtime.  . sodium chloride (OCEAN) 0.65 % SOLN nasal spray Place 2 sprays into both nostrils every 6 (six) hours as needed (For Dry Stuffy Nose).  . tamsulosin (FLOMAX) 0.4 MG CAPS capsule Take 1 capsule (0.4 mg total) by mouth daily after supper.   No facility-administered encounter medications on file as of 10/28/2020.    Allergies (verified) Patient has no known allergies.   History: Past  Medical History:  Diagnosis Date  . A-fib (HCC)   . Cellulitis   . CHF (congestive heart failure) (HCC)   . Hemorrhoid   . Hypertension   . Prostate disorder    Past Surgical History:  Procedure Laterality Date  . BIOPSY  03/17/2020   Procedure: BIOPSY;  Surgeon: Corbin Ade, MD;  Location: [facility];  Service: Endoscopy;;  gastric rectal  . ESOPHAGEAL DILATION N/A 03/17/2020   Procedure: ESOPHAGEAL DILATION;  Surgeon: Corbin Ade, MD;  Location: [facility];  Service: Endoscopy;  Laterality: N/A;  . ESOPHAGOGASTRODUODENOSCOPY (EGD) WITH PROPOFOL N/A 03/17/2020   Procedure: ESOPHAGOGASTRODUODENOSCOPY (EGD) WITH PROPOFOL;  Surgeon: Corbin Ade, MD;  Location: [facility];  Service: Endoscopy;  Laterality: N/A;  . FLEXIBLE SIGMOIDOSCOPY N/A 03/17/2020   Procedure: FLEXIBLE SIGMOIDOSCOPY WITH PROPOFOL;  Surgeon: Corbin Ade, MD;  Location: [facility];  Service: Endoscopy;  Laterality: N/A;  . HEMORRHOID SURGERY     patient reports several hemorrhoid surgeries in the past   Family History  Problem Relation Age of Onset  . Heart disease Mother   . Colon cancer Neg Hx    Social History   Socioeconomic History  . Marital status: Legally Separated    Spouse name: Not on file  . Number of children: Not on file  . Years of education: Not on file  . Highest education level: Not on file  Occupational History  . Occupation: retired   Tobacco Use  . Smoking status: Former Games developer  . Smokeless tobacco: Never Used  Vaping Use  . Vaping Use: Never used  Substance and Sexual Activity  . Alcohol use: Yes    Comment: "very little"  . Drug use: Never  . Sexual activity: Not Currently  Other Topics Concern  . Not on file  Social History Narrative   On his third marriage x 18 years. Has 3 children and several grandchildren. His son lives in [place], [place]  and is primary contact. 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 .  Former smoker.    Long term  resident of SNF       Social Determinants of Health   Financial Resource Strain:   . Difficulty of Paying Living Expenses: Not on file  Food Insecurity:   . Worried About Programme researcher, broadcasting/film/video in the Last Year: Not on file  . Ran Out of Food in the Last Year: Not on file  Transportation Needs:   . Lack of Transportation (Medical): Not on file  . Lack of Transportation (Non-Medical): Not on file  Physical Activity:   . Days of Exercise per Week: Not on file  . Minutes of Exercise per Session: Not on file  Stress:   . Feeling of Stress : Not on file  Social Connections:   . Frequency of Communication with Friends and Family: Not on file  . Frequency of Social Gatherings with Friends and Family: Not on  file  . Attends Religious Services: Not on file  . Active Member of Clubs or Organizations: Not on file  . Attends BankerClub or Organization Meetings: Not on file  . Marital Status: Not on file    Tobacco Counseling Counseling given: Not Answered   Clinical Intake:  Pre-visit preparation completed: Yes  Pain : No/denies pain Pain Score: 0-No pain     BMI - recorded: 29.29 Nutritional Status: BMI 25 -29 Overweight Diabetes: No  How often do you need to have someone help you when you read instructions, pamphlets, or other written materials from your doctor or pharmacy?: 4 - Often  Diabetic?no  Interpreter Needed?: No      Activities of Daily Living In your present state of health, do you have any difficulty performing the following activities: 10/28/2020 05/28/2020  Hearing? N -  Vision? N -  Difficulty concentrating or making decisions? Y -  Walking or climbing stairs? Y Y  Dressing or bathing? Y -  Doing errands, shopping? Y N  Preparing Food and eating ? Y -  Using the Toilet? Y -  In the past six months, have you accidently leaked urine? Y -  Do you have problems with loss of bowel control? Y -  Managing your Medications? Y -  Managing your Finances? Y -  Some  recent data might be hidden    Patient Care Team: Sharee HolsterGreen, Lawarence Meek S, NP as PCP - [facility]  Indicate any recent Medical Services you may have received from other than [facility] providers in the past year (date may be approximate).     Assessment:   This is a routine wellness examination for Everlean AlstromMaurice.  Hearing/Vision screen No exam data present  Dietary issues and exercise activities discussed: Current Exercise Habits: The patient does not participate in regular exercise at present  Goals    . Follow up with Primary Care Provider    . General - Client will not be readmitted within 30 days ([facility])      Depression Screen PHQ 2/9 Scores 10/28/2020  PHQ - 2 Score 0    Fall Risk Fall Risk  10/28/2020  Falls in the past year? 1  Number falls in past yr: 0  Injury with Fall? 0  Risk for fall due to : History of fall(s);Impaired balance/gait;Impaired mobility    Any stairs in or around the home? no If so, are there any without handrails? n/a Home free of loose throw rugs in walkways, pet beds, electrical cords, etc? yes Adequate lighting in your home to reduce risk of falls? yes  ASSISTIVE DEVICES UTILIZED TO PREVENT FALLS:  Life alert? n/a Use of a cane, walker or w/c?yes Grab bars in the bathroom? yes Shower chair or bench in shower?yes Elevated toilet seat or a handicapped toilet? yes  TIMED UP AND GO:  Was the test performed? no  Length of time to ambulate does not ambulate   Cognitive Function:     6CIT Screen 10/28/2020  What Year? 0 points  What month? 0 points  What time? 3 points  Count back from 20 2 points  Months in reverse 2 points  Repeat phrase 2 points  Total Score 9    Immunizations Immunization History  Administered Date(s) Administered  . Moderna SARS-COVID-2 Vaccination 03/31/2020, 05/06/2020     Qualifies for Shingles Vaccine? Yes  Screening Tests Health Maintenance    Topic Date Due  . TETANUS/TDAP  Never done  .  PNA vac Low Risk Adult (1 of 2 - PCV13) Never done  . INFLUENZA VACCINE  03/30/2021 (Originally 07/31/2020)  . COVID-19 Vaccine  Completed    Health Maintenance  Health Maintenance Due  Topic Date Due  . TETANUS/TDAP  Never done  . PNA vac Low Risk Adult (1 of 2 - PCV13) Never done    Lung Cancer Screening: (Low Dose CT Chest recommended if Age 7-80 years, 30 pack-year currently smoking OR have quit w/in 15years.)does not qualify.   Lung Cancer Screening Referral: n/a   Additional Screening:  Hepatitis C Screening: n/a   Vision Screening: Recommended annual ophthalmology exams for early detection of glaucoma and other disorders of the eye. Is the patient up to date with their annual eye exam?  yes Who is the provider or what is the name of the office in which the patient attends annual eye exams?at facility  If pt is not established with a provider, would they like to be referred to a provider to establish care?   Dental Screening: Recommended annual dental exams for proper oral hygiene  Community Resource Referral / Chronic Care Management: CRR required this visit?  no   CCM required this visit? Yes      Plan:     I have personally reviewed and noted the following in the patient's chart:   . Medical and social history . Use of alcohol, tobacco or illicit drugs  . Current medications and supplements . Functional ability and status . Nutritional status . Physical activity . Advanced directives . List of other physicians . Hospitalizations, surgeries, and ER visits in previous 12 months . Vitals . Screenings to include cognitive, depression, and falls . Referrals and appointments  In addition, I have reviewed and discussed with patient certain preventive protocols, quality metrics, and best practice recommendations. A written personalized care plan for preventive services as well as general preventive health  recommendations were provided to patient.     Sharee Holster, NP   10/28/2020      This encounter was created in error - please disregard.

## 2020-10-28 NOTE — Patient Instructions (Signed)
  Mr. Jesus Fowler , Thank you for taking time to come for your Medicare Wellness Visit. I appreciate your ongoing commitment to your health goals. Please review the following plan we discussed and let me know if I can assist you in the future.   These are the goals we discussed: Goals    . Follow up with Primary Care Provider    . General - Client will not be readmitted within 30 days ([facility])       This is a list of the screening recommended for you and due dates:  Health Maintenance  Topic Date Due  . Tetanus Vaccine  Never done  . Pneumonia vaccines (1 of 2 - PCV13) Never done  . Flu Shot  03/30/2021*  . COVID-19 Vaccine  Completed  *Topic was postponed. The date shown is not the original due date.    

## 2020-11-01 ENCOUNTER — Encounter: Payer: Self-pay | Admitting: Adult Health

## 2020-11-01 ENCOUNTER — Non-Acute Institutional Stay (INDEPENDENT_AMBULATORY_CARE_PROVIDER_SITE_OTHER): Payer: Medicare Other | Admitting: Adult Health

## 2020-11-01 DIAGNOSIS — Z0000 Encounter for general adult medical examination without abnormal findings: Secondary | ICD-10-CM | POA: Diagnosis not present

## 2020-11-01 NOTE — Progress Notes (Signed)
Subjective:   Jesus Fowler is a 86 y.o. male who presents for Medicare Annual/Subsequent preventive examination.  Review of Systems     Review of Systems  Constitutional: Negative for malaise/fatigue.  Respiratory: Negative for cough and shortness of breath.   Cardiovascular: Negative for chest pain, palpitations and leg swelling.  Gastrointestinal: Negative for abdominal pain, constipation and heartburn.  Musculoskeletal: Negative for back pain, joint pain and myalgias.  Skin: Negative.   Neurological: Negative for dizziness.  Psychiatric/Behavioral: The patient is not nervous/anxious.     Cardiac Risk Factors include: advanced age (>84men, >78 women);sedentary lifestyle     Objective:    Today's Vitals   11/01/20 1121 11/01/20 1134  BP: 131/71   Pulse: 82   Temp: 98 F (36.7 C)   SpO2: 93%   Height: 5\' 5"  (1.651 m)   PainSc:  0-No pain   Body mass index is 29.29 kg/m.  Advanced Directives 11/01/2020 10/28/2020 10/04/2020 09/15/2020 09/02/2020 08/02/2020 07/01/2020  Does Patient Have a Medical Advance Directive? Yes Yes Yes Yes - Yes Yes  Type of Advance Directive Healthcare Power of [place];Living will Healthcare Power of Attorney - (No Data) (No Data) (No Data) (No Data)  Does patient want to make changes to medical advance directive? No - Patient declined No - Patient declined No - Patient declined No - Patient declined No - Patient declined No - Patient declined No - Patient declined  Copy of Healthcare Power of Attorney in Chart? Yes - validated most recent copy scanned in chart (See row information) Yes - validated most recent copy scanned in chart (See row information) - - - - -  Would patient like information on creating a medical advance directive? - - - - - - -    Current Medications (verified) Outpatient Encounter Medications as of 11/01/2020  Medication Sig  . acetaminophen (TYLENOL) 325 MG tablet Take 2 tablets (650 mg total) by mouth every 6 (six) hours as  needed for mild pain, fever or headache (or Fever >/= 101).  13/02/2020 albuterol (VENTOLIN HFA) 108 (90 Base) MCG/ACT inhaler Inhale 2 puffs into the lungs every 6 (six) hours as needed for wheezing or shortness of breath.  Marland Kitchen apixaban (ELIQUIS) 5 MG TABS tablet Take 1 tablet (5 mg total) by mouth 2 (two) times daily.  Marland Kitchen ascorbic acid (VITAMIN C) 500 MG tablet Take 1,000 mg by mouth daily.  Marland Kitchen Peru-Castor Oil (VENELEX) OINT Apply 1 application topically in the morning, at noon, and at bedtime.  . carboxymethylcellul-glycerin (OPTIVE) 0.5-0.9 % ophthalmic solution Place 1 drop into both eyes 4 (four) times daily. For dry eyes  . feeding supplement, ENSURE ENLIVE, (ENSURE ENLIVE) LIQD Take 237 mLs by mouth daily. To promote weight maintenance  . gabapentin (NEURONTIN) 100 MG capsule Take 1 capsule (100 mg total) by mouth at bedtime.  . metoprolol succinate (TOPROL-XL) 25 MG 24 hr tablet Take 0.5 tablets (12.5 mg total) by mouth daily.  . NON FORMULARY BiPap at setting of 20/16 while sleeping. Twice A Day  . NON FORMULARY Diet: _____ Regular, __x____ NAS, _______Consistent Carbohydrate, _______NPO _____Other  . Omega-3 Fatty Acids (FISH OIL BURP-LESS PO) Take 2 capsules by mouth daily. For a total of 2000 mg daily  . OXYGEN Inhale 2 L into the lungs continuous.   . pantoprazole (PROTONIX) 40 MG tablet Take 1 tablet (40 mg total) by mouth daily.  . polyethylene glycol (MIRALAX / GLYCOLAX) 17 g packet Take 17 g by mouth daily as needed.   [name]  rosuvastatin (CRESTOR) 10 MG tablet Take 1 tablet (10 mg total) by mouth in the morning.  . senna-docusate (SENOKOT-S) 8.6-50 MG tablet Take 2 tablets by mouth at bedtime.  . sodium chloride (OCEAN) 0.65 % SOLN nasal spray Place 2 sprays into both nostrils every 6 (six) hours as needed (For Dry Stuffy Nose).  . tamsulosin (FLOMAX) 0.4 MG CAPS capsule Take 1 capsule (0.4 mg total) by mouth daily after supper.   No facility-administered encounter medications on  file as of 11/01/2020.    Allergies (verified) Patient has no known allergies.   History: Past Medical History:  Diagnosis Date  . A-fib (HCC)   . Cellulitis   . CHF (congestive heart failure) (HCC)   . Hemorrhoid   . Hypertension   . Prostate disorder    Past Surgical History:  Procedure Laterality Date  . BIOPSY  03/17/2020   Procedure: BIOPSY;  Surgeon: Corbin Ade, MD;  [facility];  Service: Endoscopy;;  gastric rectal  . ESOPHAGEAL DILATION N/A 03/17/2020   Procedure: ESOPHAGEAL DILATION;  Surgeon: Corbin Ade, MD;  [facility];  Service: Endoscopy;  Laterality: N/A;  . ESOPHAGOGASTRODUODENOSCOPY (EGD) WITH PROPOFOL N/A 03/17/2020   Procedure: ESOPHAGOGASTRODUODENOSCOPY (EGD) WITH PROPOFOL;  Surgeon: Corbin Ade, MD;  [facility];  Service: Endoscopy;  Laterality: N/A;  . FLEXIBLE SIGMOIDOSCOPY N/A 03/17/2020   Procedure: FLEXIBLE SIGMOIDOSCOPY WITH PROPOFOL;  Surgeon: Corbin Ade, MD;  [facility];  Service: Endoscopy;  Laterality: N/A;  . HEMORRHOID SURGERY     patient reports several hemorrhoid surgeries in the past   Family History  Problem Relation Age of Onset  . Heart disease Mother   . Colon cancer Neg Hx    Social History   Socioeconomic History  . Marital status: Legally Separated    Spouse name: Not on file  . Number of children: Not on file  . Years of education: Not on file  . Highest education level: Not on file  Occupational History  . Occupation: retired   Tobacco Use  . Smoking status: Former Games developer  . Smokeless tobacco: Never Used  Vaping Use  . Vaping Use: Never used  Substance and Sexual Activity  . Alcohol use: Yes    Comment: "very little"  . Drug use: Never  . Sexual activity: Not Currently  Other Topics Concern  . Not on file  Social History Narrative   On his third marriage x 18 years. Has 3 children and several grandchildren. His son lives in [place], [place]  and is primary  contact. 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 .  Former smoker.    Long term resident of SNF       Social Determinants of Health   Financial Resource Strain:   . Difficulty of Paying Living Expenses: Not on file  Food Insecurity:   . Worried About Programme researcher, broadcasting/film/video in the Last Year: Not on file  . Ran Out of Food in the Last Year: Not on file  Transportation Needs:   . Lack of Transportation (Medical): Not on file  . Lack of Transportation (Non-Medical): Not on file  Physical Activity:   . Days of Exercise per Week: Not on file  . Minutes of Exercise per Session: Not on file  Stress:   . Feeling of Stress : Not on file  Social Connections:   . Frequency of Communication with Friends and Family: Not on file  .  Frequency of Social Gatherings with Friends and Family: Not on file  . Attends Religious Services: Not on file  . Active Member of Clubs or Organizations: Not on file  . Attends BankerClub or Organization Meetings: Not on file  . Marital Status: Not on file    Tobacco Counseling Counseling given: Not Answered   Clinical Intake:  Pre-visit preparation completed: Yes  Pain : No/denies pain Pain Score: 0-No pain     BMI - recorded: 29.29 Nutritional Status: BMI 25 -29 Overweight Nutritional Risks: None Diabetes: No  How often do you need to have someone help you when you read instructions, pamphlets, or other written materials from your doctor or pharmacy?: 5 - Always  Diabetic?no  Interpreter Needed?: No      Activities of Daily Living In your present state of health, do you have any difficulty performing the following activities: 11/01/2020 10/28/2020  Hearing? N N  Vision? N N  Difficulty concentrating or making decisions? Malvin JohnsY Y  Walking or climbing stairs? Y Y  Dressing or bathing? Y Y  Doing errands, shopping? Malvin JohnsY Y  Preparing Food and eating ? Y Y  Using the Toilet? Y Y  In the past six months, have you accidently leaked urine? Y Y    Do you have problems with loss of bowel control? Y Y  Managing your Medications? Y Y  Managing your Finances? Malvin JohnsY Y  Housekeeping or managing your Housekeeping? Y -  Some recent data might be hidden    Patient Care Team: Sharee HolsterGreen, Seydina Holliman S, NP as PCP - General (Geriatric Medicine[facility]  Indicate any recent Medical Services you may have received from other than [facility] providers in the past year (date may be approximate).     Assessment:   This is a routine wellness examination for Everlean AlstromMaurice.  Hearing/Vision screen No exam data present  Dietary issues and exercise activities discussed: Current Exercise Habits: The patient does not participate in regular exercise at present  Goals    . Follow up with Primary Care Provider    . General - Client will not be readmitted within 30 days ([facility])      Depression Screen PHQ 2/9 Scores 11/01/2020 10/28/2020  PHQ - 2 Score 0 0    Fall Risk Fall Risk  11/01/2020 10/28/2020  Falls in the past year? 1 1  Number falls in past yr: 0 0  Injury with Fall? 0 0  Risk for fall due to : Impaired balance/gait;History of fall(s) History of fall(s);Impaired balance/gait;Impaired mobility    Any stairs in or around the home? No  If so, are there any without handrails? n/a Home free of loose throw rugs in walkways, pet beds, electrical cords, etc? yes  Adequate lighting in your home to reduce risk of falls? Yes   ASSISTIVE DEVICES UTILIZED TO PREVENT FALLS:  Life alert? N/a  Use of a cane, walker or w/c?w/c Grab bars in the bathroom? Yes  Shower chair or bench in shower? Yes  Elevated toilet seat or a handicapped toilet? Yes  TIMED UP AND GO:  Was the test performed? no .  Nonambulatory   Cognitive Function:     6CIT Screen 10/28/2020  What Year? 0 points  What month? 0 points  What time? 3 points  Count back from 20 2 points  Months in reverse 2 points  Repeat phrase 2 points  Total Score 9     Immunizations Immunization History  Administered Date(s) Administered  .  Moderna SARS-COVID-2 Vaccination 03/31/2020, 05/06/2020     Qualifies for Shingles Vaccine? Yes  Zostavax complete per facility   Screening Tests Health Maintenance  Topic Date Due  . TETANUS/TDAP  Never done  . PNA vac Low Risk Adult (1 of 2 - PCV13) Never done  . INFLUENZA VACCINE  03/30/2021 (Originally 07/31/2020)  . COVID-19 Vaccine  Completed    Health Maintenance  Health Maintenance Due  Topic Date Due  . TETANUS/TDAP  Never done  . PNA vac Low Risk Adult (1 of 2 - PCV13) Never done    Lung Cancer Screening: (Low Dose CT Chest recommended if Age 21-80 years, 30 pack-year currently smoking OR have quit w/in 15years.)does not qualify.   Lung Cancer Screening Referral:n/a  Additional Screening:  Hepatitis C Screening: per facility  Vision Screening: Recommended annual ophthalmology exams for early detection of glaucoma and other disorders of the eye. Is the patient up to date with their annual eye exam?  yes  Who is the provider or what is the name of the office in which the patient attends annual eye exams? Per facility  If pt is not established with a provider, would they like to be referred to a provider to establish care?  Dental Screening: Recommended annual dental exams for proper oral hygiene  Community Resource Referral / Chronic Care Management: CRR required this visit?  no CCM required this visit?  Yes      Plan:     I have personally reviewed and noted the following in the patient's chart:   . Medical and social history . Use of alcohol, tobacco or illicit drugs  . Current medications and supplements . Functional ability and status . Nutritional status . Physical activity . Advanced directives . List of other physicians . Hospitalizations, surgeries, and ER visits in previous 12 months . Vitals . Screenings to include cognitive, depression, and falls . Referrals  and appointments  In addition, I have reviewed and discussed with patient certain preventive protocols, quality metrics, and best practice recommendations. A written personalized care plan for preventive services as well as general preventive health recommendations were provided to patient.     Sharee Holster, NP   11/01/2020

## 2020-11-01 NOTE — Patient Instructions (Signed)
  Jesus Fowler , Thank you for taking time to come for your Medicare Wellness Visit. I appreciate your ongoing commitment to your health goals. Please review the following plan we discussed and let me know if I can assist you in the future.   These are the goals we discussed: Goals    . Follow up with Primary Care Provider    . General - Client will not be readmitted within 30 days ([facility])       This is a list of the screening recommended for you and due dates:  Health Maintenance  Topic Date Due  . Tetanus Vaccine  Never done  . Pneumonia vaccines (1 of 2 - PCV13) Never done  . Flu Shot  03/30/2021*  . COVID-19 Vaccine  Completed  *Topic was postponed. The date shown is not the original due date.

## 2020-11-07 ENCOUNTER — Non-Acute Institutional Stay (SKILLED_NURSING_FACILITY): Payer: Medicare Other | Admitting: Adult Health

## 2020-11-07 DIAGNOSIS — E785 Hyperlipidemia, unspecified: Secondary | ICD-10-CM | POA: Diagnosis not present

## 2020-11-07 DIAGNOSIS — N400 Enlarged prostate without lower urinary tract symptoms: Secondary | ICD-10-CM | POA: Diagnosis not present

## 2020-11-07 DIAGNOSIS — E43 Unspecified severe protein-calorie malnutrition: Secondary | ICD-10-CM

## 2020-11-08 ENCOUNTER — Encounter: Payer: Self-pay | Admitting: Adult Health

## 2020-11-08 NOTE — Progress Notes (Signed)
Location:   [facility] Room Number: 222 Place of Service:  SNF ([id number])   CODE STATUS: full code   No Known Allergies  Chief Complaint  Patient presents with  . Medical Management of Chronic Issues         Dyslipidemia    Protein calorie malnutrition: severe:   BPH without urinary obstruction    HPI:  He is a 86 year old long term resident of this facility being seen for the management of his chronic illnesses:  Dyslipidemia    Protein calorie malnutrition: severe:   BPH without urinary obstruction. There are no reports of uncontrolled pain. His weight is stable has good appetite; no reports of anxiety or agitation.   Past Medical History:  Diagnosis Date  . A-fib (HCC)   . Cellulitis   . CHF (congestive heart failure) (HCC)   . Hemorrhoid   . Hypertension   . Prostate disorder     Past Surgical History:  Procedure Laterality Date  . BIOPSY  03/17/2020   Procedure: BIOPSY;  Surgeon: Corbin Ade, MD;  [facility];  Service: Endoscopy;;  gastric rectal  . ESOPHAGEAL DILATION N/A 03/17/2020   Procedure: ESOPHAGEAL DILATION;  Surgeon: Corbin Ade, MD;  [facility];  Service: Endoscopy;  Laterality: N/A;  . ESOPHAGOGASTRODUODENOSCOPY (EGD) WITH PROPOFOL N/A 03/17/2020   Procedure: ESOPHAGOGASTRODUODENOSCOPY (EGD) WITH PROPOFOL;  Surgeon: Corbin Ade, MD;  [facility];  Service: Endoscopy;  Laterality: N/A;  . FLEXIBLE SIGMOIDOSCOPY N/A 03/17/2020   Procedure: FLEXIBLE SIGMOIDOSCOPY WITH PROPOFOL;  Surgeon: Corbin Ade, MD;  [facility];  Service: Endoscopy;  Laterality: N/A;  . HEMORRHOID SURGERY     patient reports several hemorrhoid surgeries in the past    Social History   Socioeconomic History  . Marital status: Legally Separated    Spouse name: Not on file  . Number of children: Not on file  . Years of education: Not on file  . Highest education level: Not on file  Occupational History  .  Occupation: retired   Tobacco Use  . Smoking status: Former Games developer  . Smokeless tobacco: Never Used  Vaping Use  . Vaping Use: Never used  Substance and Sexual Activity  . Alcohol use: Yes    Comment: "very little"  . Drug use: Never  . Sexual activity: Not Currently  Other Topics Concern  . Not on file  Social History Narrative   On his third marriage x 18 years. Has 3 children and several grandchildren. His son lives in [place], [place]  and is primary contact. 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 .  Former smoker.    Long term resident of SNF       Social Determinants of Health   Financial Resource Strain:   . Difficulty of Paying Living Expenses: Not on file  Food Insecurity:   . Worried About Programme researcher, broadcasting/film/video in the Last Year: Not on file  . Ran Out of Food in the Last Year: Not on file  Transportation Needs:   . Lack of Transportation (Medical): Not on file  . Lack of Transportation (Non-Medical): Not on file  Physical Activity:   . Days of Exercise per Week: Not on file  . Minutes of Exercise per Session: Not on file  Stress:   . Feeling of Stress : Not on file  Social Connections:   . Frequency of Communication  with Friends and Family: Not on file  . Frequency of Social Gatherings with Friends and Family: Not on file  . Attends Religious Services: Not on file  . Active Member of Clubs or Organizations: Not on file  . Attends Banker Meetings: Not on file  . Marital Status: Not on file  Intimate Partner Violence:   . Fear of Current or Ex-Partner: Not on file  . Emotionally Abused: Not on file  . Physically Abused: Not on file  . Sexually Abused: Not on file   Family History  Problem Relation Age of Onset  . Heart disease Mother   . Colon cancer Neg Hx       VITAL SIGNS BP 127/71   Pulse 78   Temp 98.2 F (36.8 C)   Resp 16   Ht 5\' 5"  (1.651 m)   Wt 182 lb (82.6 kg)   SpO2 92%   BMI 30.29 kg/m   Outpatient  Encounter Medications as of 11/07/2020  Medication Sig  . acetaminophen (TYLENOL) 325 MG tablet Take 2 tablets (650 mg total) by mouth every 6 (six) hours as needed for mild pain, fever or headache (or Fever >/= 101).  13/07/2020 albuterol (VENTOLIN HFA) 108 (90 Base) MCG/ACT inhaler Inhale 2 puffs into the lungs every 6 (six) hours as needed for wheezing or shortness of breath.  Marland Kitchen apixaban (ELIQUIS) 5 MG TABS tablet Take 1 tablet (5 mg total) by mouth 2 (two) times daily.  Marland Kitchen ascorbic acid (VITAMIN C) 500 MG tablet Take 1,000 mg by mouth daily.  Marland Kitchen Peru-Castor Oil (VENELEX) OINT Apply 1 application topically in the morning, at noon, and at bedtime.  . carboxymethylcellul-glycerin (OPTIVE) 0.5-0.9 % ophthalmic solution Place 1 drop into both eyes 4 (four) times daily. For dry eyes  . feeding supplement, ENSURE ENLIVE, (ENSURE ENLIVE) LIQD Take 237 mLs by mouth daily. To promote weight maintenance  . gabapentin (NEURONTIN) 100 MG capsule Take 1 capsule (100 mg total) by mouth at bedtime.  . metoprolol succinate (TOPROL-XL) 25 MG 24 hr tablet Take 0.5 tablets (12.5 mg total) by mouth daily.  . NON FORMULARY BiPap at setting of 20/16 while sleeping. Twice A Day  . NON FORMULARY Diet: _____ Regular, __x____ NAS, _______Consistent Carbohydrate, _______NPO _____Other  . Omega-3 Fatty Acids (FISH OIL BURP-LESS PO) Take 2 capsules by mouth daily. For a total of 2000 mg daily  . OXYGEN Inhale 2 L into the lungs continuous.   . pantoprazole (PROTONIX) 40 MG tablet Take 1 tablet (40 mg total) by mouth daily.  . polyethylene glycol (MIRALAX / GLYCOLAX) 17 g packet Take 17 g by mouth daily as needed.   . rosuvastatin (CRESTOR) 10 MG tablet Take 1 tablet (10 mg total) by mouth in the morning.  . senna-docusate (SENOKOT-S) 8.6-50 MG tablet Take 2 tablets by mouth at bedtime.  . sodium chloride (OCEAN) 0.65 % SOLN nasal spray Place 2 sprays into both nostrils every 6 (six) hours as needed (For Dry Stuffy Nose).    . tamsulosin (FLOMAX) 0.4 MG CAPS capsule Take 1 capsule (0.4 mg total) by mouth daily after supper.   No facility-administered encounter medications on file as of 11/07/2020.     SIGNIFICANT DIAGNOSTIC EXAMS   PREVIOUS  05-27-20: chest x-ray:  1. No acute abnormality. 2. Stable cardiomegaly and mild chronic bronchitic changes. 3. Stable elevated right hemidiaphragm  05-27-20: ct of head: Mild atrophy. No acute abnormality   05-27-20: MRI of brain:  No acute abnormality  Motion degraded study Atrophy with mild chronic microvascular ischemic change in the white matter.  05-27-20: MRI of lumbar spine:  1. Image quality degraded by moderate motion. 2. Moderate levoscoliosis lumbar spine. Multilevel degenerative changes throughout the lumbar spine with spinal and foraminal stenosis as described above. 3. Moderate spinal stenosis L2-3 with moderate subarticular and foraminal stenosis right greater than left 4. 7 mm anterolisthesis L5-S1 with possible pars defect on the left. Marked left foraminal encroachment with impingement left L5 nerveroot. 5. These results will be called to the ordering clinician or representative by the Radiologist Assistant, and communication documented in the PACS or [employer].  NO NEW EXAMS.    LABS REVIEWED PREVIOUS  03-08-20: wbc 18.9 hgb 16.7; hct 51.1; mcv 98.1 plt 143; glucose 100; bun 29; creat 1.28; k= 3.7; na++ 140; ca 9.4; total bili 2.9; albumin 3.2; tsh 0.492; mag 2.5 03-09-20: blood cultures: no growth 03-10-20: wbc 17.0; hgb 16.4; hct 52.2; mcv 100.4 plt 108; glucose 112; bun 29; creat 1.25; k+ 3.7; an++ 140; ca 8.9  03-13-20: wbc 13.8; hgb 15.1; hct 49.4 mcv 103.1 plt 85; glucose 94; bun 21; creat 0.87; k+ 3.7; na++ 140; ca 8.1  03-14-20: wbc 19.4; hgb 16.7; hct 52.7; mcv 101.3 plt 87; glucose 114; bun 20; creat 0.74; k+ 3.9; na++ 140; ca 8.3; liver normal albumin 2.7 03-17-20: glucose 82; bun 20; creat 0.67; k+ 3.8; na++ 138; ca 8.2 liver  normal albumin 2.3 03-18-20: wbc 10.4; hgb 15.9; hct 50.7 mcv 101.4; plt 84; glucose 105; bun 23; creat 0.70; k+ 4.3; na++ 140; ca 9.1 03-24-20: wbc 7.8; hgb 13.9; hct 43.6; mcv 101.4 plt 100; glucose 99; bun 22; creat 0.80; k+ 3.7; na++ 140; ca 8.5  05-27-20: wbc 10.3; hgb 14.1; hct 44.3; mcv 102.1 plt 199; glucose 99 bun 22; creat 0.8; k+ 3.7; na++ 140; ca 9.6 liver normal albumin 3.2 BNP 281; tsh 0.465; mag 2.0; urine culture no growth 05-30-20: wbc 13.2; hgb 13.0; hct 40.5; mcv 102.0 plt 197  07-28-20: vit D 93.39  NO NEW LABS.     Review of Systems  Constitutional: Negative for malaise/fatigue.  Respiratory: Negative for cough and shortness of breath.   Cardiovascular: Negative for chest pain, palpitations and leg swelling.  Gastrointestinal: Negative for abdominal pain, constipation and heartburn.  Musculoskeletal: Negative for back pain, joint pain and myalgias.  Skin: Negative.   Neurological: Negative for dizziness.  Psychiatric/Behavioral: The patient is not nervous/anxious.       Physical Exam Constitutional:      General: He is not in acute distress.    Appearance: He is well-developed. He is obese. He is not diaphoretic.  Neck:     Thyroid: No thyromegaly.  Cardiovascular:     Rate and Rhythm: Normal rate. Rhythm irregular.     Pulses: Normal pulses.     Heart sounds: Normal heart sounds.  Pulmonary:     Effort: Pulmonary effort is normal. No respiratory distress.     Breath sounds: Normal breath sounds.  Abdominal:     General: Bowel sounds are normal. There is no distension.     Palpations: Abdomen is soft.     Tenderness: There is no abdominal tenderness.  Musculoskeletal:        General: Normal range of motion.     Cervical back: Neck supple.     Right lower leg: No edema.     Left lower leg: No edema.  Lymphadenopathy:     Cervical: No cervical adenopathy.  Skin:    General: Skin is warm and dry.     Comments: Wraps in place on bilateral lower extremities     Neurological:     Mental Status: He is alert. Mental status is at baseline.  Psychiatric:        Mood and Affect: Mood normal.      ASSESSMENT/ PLAN:  TODAY  1. Dyslipidemia is stable will continue crestor 10 mg daily and fish oil 2 gm daily  2. Protein calorie malnutrition: severe: albumin 3.2 will continue supplements as directed  3. BPH without urinary obstruction: is stable will continue flomax 0.4 mg daily    PREVIOUS  4. Lumbar radiculopathy: L5/S1: is stable will continue gabapentin 100 mg nightly   5. Atrial fibrillation chronic: is stable will continue toproxl xl 12.5 mg daily for rate control and eliquis 5 mg twice daily   6. Acute on chronic heart failure with preserved ejection fraction diastolic dysfunction: is compensated will continue toprol xl 12.5 mg daily   7. OSA on BIPAP is stable will continue BIPAP nightly   8. GERD without esophagitis: is stable will continue protonix 40 mg daily   9. Chronic constipation: is stable will continue miralax twice daily and senna s 2 tabs daily   10. Chronic venous insufficiency: is without change wraps intact    Will check cbc; cmp vit d tsh lipids mag   MD is aware of resident's narcotic use and is in agreement with current plan of care. We will attempt to wean resident as appropriate.  Synthia Innocenteborah Daylah Sayavong NP [facility]  Contact [phone] Monday through Friday 8am- 5pm  After hours call [phone]

## 2020-11-10 ENCOUNTER — Other Ambulatory Visit (HOSPITAL_COMMUNITY)
Admission: RE | Admit: 2020-11-10 | Discharge: 2020-11-10 | Disposition: A | Discharge status: Home / Self Care | Payer: Medicare Other | Source: Skilled Nursing Facility | Attending: Adult Health | Admitting: Adult Health

## 2020-11-10 DIAGNOSIS — I5032 Chronic diastolic (congestive) heart failure: Secondary | ICD-10-CM | POA: Diagnosis present

## 2020-11-10 LAB — CBC
HCT: 44.4 % (ref 39.0–52.0)
Hemoglobin: 13.7 g/dL (ref 13.0–17.0)
MCH: 31.1 pg (ref 26.0–34.0)
MCHC: 30.9 g/dL (ref 30.0–36.0)
MCV: 100.9 fL — ABNORMAL HIGH (ref 80.0–100.0)
Platelets: 156 10*3/uL (ref 150–400)
RBC: 4.4 MIL/uL (ref 4.22–5.81)
RDW: 15.3 % (ref 11.5–15.5)
WBC: 7.3 10*3/uL (ref 4.0–10.5)
nRBC: 0 % (ref 0.0–0.2)

## 2020-11-10 LAB — TSH: TSH: 1.02 u[IU]/mL (ref 0.350–4.500)

## 2020-11-10 LAB — LIPID PANEL
Cholesterol: 83 mg/dL (ref 0–200)
HDL: 35 mg/dL — ABNORMAL LOW (ref >40)
LDL Cholesterol: 40 mg/dL (ref 0–99)
Total CHOL/HDL Ratio: 2.4 RATIO
Triglycerides: 38 mg/dL (ref <150)
VLDL: 8 mg/dL (ref 0–40)

## 2020-11-10 LAB — COMPREHENSIVE METABOLIC PANEL
ALT: 30 U/L (ref 0–44)
AST: 29 U/L (ref 15–41)
Albumin: 3.1 g/dL — ABNORMAL LOW (ref 3.5–5.0)
Alkaline Phosphatase: 77 U/L (ref 38–126)
Anion gap: 8 (ref 5–15)
BUN: 13 mg/dL (ref 8–23)
CO2: 33 mmol/L — ABNORMAL HIGH (ref 22–32)
Calcium: 9.2 mg/dL (ref 8.9–10.3)
Chloride: 102 mmol/L (ref 98–111)
Creatinine, Ser: 0.73 mg/dL (ref 0.61–1.24)
GFR, Estimated: >60 mL/min (ref >60)
Glucose, Bld: 75 mg/dL (ref 70–99)
Potassium: 3.8 mmol/L (ref 3.5–5.1)
Sodium: 143 mmol/L (ref 135–145)
Total Bilirubin: 1.2 mg/dL (ref 0.3–1.2)
Total Protein: 6.2 g/dL — ABNORMAL LOW (ref 6.5–8.1)

## 2020-11-10 LAB — MAGNESIUM: Magnesium: 1.9 mg/dL (ref 1.7–2.4)

## 2020-11-10 LAB — VITAMIN D 25 HYDROXY (VIT D DEFICIENCY, FRACTURES): Vit D, 25-Hydroxy: 52.71 ng/mL (ref 30–100)

## 2020-11-14 ENCOUNTER — Ambulatory Visit: Payer: Medicare Other | Admitting: Surgery

## 2020-12-05 ENCOUNTER — Non-Acute Institutional Stay (SKILLED_NURSING_FACILITY): Payer: Medicare Other | Admitting: Adult Health

## 2020-12-05 ENCOUNTER — Encounter: Payer: Self-pay | Admitting: Adult Health

## 2020-12-05 DIAGNOSIS — E785 Hyperlipidemia, unspecified: Secondary | ICD-10-CM

## 2020-12-05 DIAGNOSIS — I482 Chronic atrial fibrillation, unspecified: Secondary | ICD-10-CM

## 2020-12-05 DIAGNOSIS — M5416 Radiculopathy, lumbar region: Secondary | ICD-10-CM | POA: Diagnosis not present

## 2020-12-05 NOTE — Progress Notes (Signed)
Location:    [facility] Room Number: 119P Place of Service:  SNF (31)   CODE STATUS: Full Code  No Known Allergies  Chief Complaint  Patient presents with  . Medical Management of Chronic Issues           Dyslipidemia:   Lumbar radiculopathy L5/S1  Atrial fibrillation chronic    HPI:  He is a 86 year old long term resident of this facility being seen for the management of his chronic illnesses:  Dyslipidemia:   Lumbar radiculopathy L5/S1  Atrial fibrillation chronic. There are no reports of uncontrolled pain; no reports of agitation; does ambulate with a rollator. No reports of changes in appetite   Past Medical History:  Diagnosis Date  . A-fib (HCC)   . Cellulitis   . CHF (congestive heart failure) (HCC)   . Hemorrhoid   . Hypertension   . Prostate disorder     Past Surgical History:  Procedure Laterality Date  . BIOPSY  03/17/2020   Procedure: BIOPSY;  Surgeon: Corbin Ade, MD;  [facility]: [facility];  Service: Endoscopy;;  gastric rectal  . ESOPHAGEAL DILATION N/A 03/17/2020   Procedure: ESOPHAGEAL DILATION;  Surgeon: Corbin Ade, MD;  [facility]: [facility];  Service: Endoscopy;  Laterality: N/A;  . ESOPHAGOGASTRODUODENOSCOPY (EGD) WITH PROPOFOL N/A 03/17/2020   Procedure: ESOPHAGOGASTRODUODENOSCOPY (EGD) WITH PROPOFOL;  Surgeon: Corbin Ade, MD;  [facility]: [facility];  Service: Endoscopy;  Laterality: N/A;  . FLEXIBLE SIGMOIDOSCOPY N/A 03/17/2020   Procedure: FLEXIBLE SIGMOIDOSCOPY WITH PROPOFOL;  Surgeon: Corbin Ade, MD;  [facility]: [facility];  Service: Endoscopy;  Laterality: N/A;  . HEMORRHOID SURGERY     patient reports several hemorrhoid surgeries in the past    Social History   Socioeconomic History  . Marital status: Legally Separated    Spouse name: Not on file  . Number of children: Not on file  . Years of education: Not on file  . Highest education level: Not on file  Occupational History  . Occupation:  retired   Tobacco Use  . Smoking status: Former Games developer  . Smokeless tobacco: Never Used  Vaping Use  . Vaping Use: Never used  Substance and Sexual Activity  . Alcohol use: Yes    Comment: "very little"  . Drug use: Never  . Sexual activity: Not Currently  Other Topics Concern  . Not on file  Social History Narrative   On his third marriage x 18 years. Has 3 children and several grandchildren. His son lives in [place], [place]  and is primary contact. 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 .  Former smoker.    Long term resident of SNF       Social Determinants of Health   Financial Resource Strain:   . Difficulty of Paying Living Expenses: Not on file  Food Insecurity:   . Worried About Programme researcher, broadcasting/film/video in the Last Year: Not on file  . Ran Out of Food in the Last Year: Not on file  Transportation Needs:   . Lack of Transportation (Medical): Not on file  . Lack of Transportation (Non-Medical): Not on file  Physical Activity:   . Days of Exercise per Week: Not on file  . Minutes of Exercise per Session: Not on file  Stress:   . Feeling of Stress : Not on file  Social Connections:   . Frequency of Communication with Friends and Family: Not  on file  . Frequency of Social Gatherings with Friends and Family: Not on file  . Attends Religious Services: Not on file  . Active Member of Clubs or Organizations: Not on file  . Attends Banker Meetings: Not on file  . Marital Status: Not on file  Intimate Partner Violence:   . Fear of Current or Ex-Partner: Not on file  . Emotionally Abused: Not on file  . Physically Abused: Not on file  . Sexually Abused: Not on file   Family History  Problem Relation Age of Onset  . Heart disease Mother   . Colon cancer Neg Hx       VITAL SIGNS BP (!) 159/74   Pulse 81   Temp 98.1 F (36.7 C)   Ht 5\' 5"  (1.651 m)   Wt 184 lb 6.4 oz (83.6 kg)   SpO2 95%   BMI 30.69 kg/m   Outpatient Encounter  Medications as of 12/05/2020  Medication Sig  . acetaminophen (TYLENOL) 325 MG tablet Take 2 tablets (650 mg total) by mouth every 6 (six) hours as needed for mild pain, fever or headache (or Fever >/= 101).  14/05/2020 albuterol (VENTOLIN HFA) 108 (90 Base) MCG/ACT inhaler Inhale 2 puffs into the lungs every 6 (six) hours as needed for wheezing or shortness of breath.  Marland Kitchen apixaban (ELIQUIS) 5 MG TABS tablet Take 1 tablet (5 mg total) by mouth 2 (two) times daily.  Marland Kitchen ascorbic acid (VITAMIN C) 500 MG tablet Take 1,000 mg by mouth daily.  Marland Kitchen Peru-Castor Oil (VENELEX) OINT Apply 1 application topically in the morning, at noon, and at bedtime.  . carboxymethylcellul-glycerin (OPTIVE) 0.5-0.9 % ophthalmic solution Place 1 drop into both eyes 4 (four) times daily. For dry eyes  . feeding supplement, ENSURE ENLIVE, (ENSURE ENLIVE) LIQD Take 237 mLs by mouth daily. To promote weight maintenance  . gabapentin (NEURONTIN) 100 MG capsule Take 1 capsule (100 mg total) by mouth at bedtime.  . metoprolol succinate (TOPROL-XL) 25 MG 24 hr tablet Take 0.5 tablets (12.5 mg total) by mouth daily.  . NON FORMULARY BiPap at setting of 20/16 while sleeping. Twice A Day  . NON FORMULARY Diet: _____ Regular, __x____ NAS, _______Consistent Carbohydrate, _______NPO _____Other  . Omega-3 Fatty Acids (FISH OIL BURP-LESS PO) Take 2 capsules by mouth daily. For a total of 2000 mg daily  . OXYGEN Inhale 2 L into the lungs continuous.   . pantoprazole (PROTONIX) 40 MG tablet Take 1 tablet (40 mg total) by mouth daily.  . polyethylene glycol (MIRALAX / GLYCOLAX) 17 g packet Take 17 g by mouth daily as needed.   . rosuvastatin (CRESTOR) 10 MG tablet Take 1 tablet (10 mg total) by mouth in the morning.  . sodium chloride (OCEAN) 0.65 % SOLN nasal spray Place 2 sprays into both nostrils every 6 (six) hours as needed (For Dry Stuffy Nose).  . tamsulosin (FLOMAX) 0.4 MG CAPS capsule Take 1 capsule (0.4 mg total) by mouth daily after  supper.  . [DISCONTINUED] senna-docusate (SENOKOT-S) 8.6-50 MG tablet Take 2 tablets by mouth at bedtime.   No facility-administered encounter medications on file as of 12/05/2020.     SIGNIFICANT DIAGNOSTIC EXAMS  PREVIOUS  05-27-20: chest x-ray:  1. No acute abnormality. 2. Stable cardiomegaly and mild chronic bronchitic changes. 3. Stable elevated right hemidiaphragm  05-27-20: ct of head: Mild atrophy. No acute abnormality   05-27-20: MRI of brain:  No acute abnormality Motion degraded study Atrophy with mild chronic  microvascular ischemic change in the white matter.  05-27-20: MRI of lumbar spine:  1. Image quality degraded by moderate motion. 2. Moderate levoscoliosis lumbar spine. Multilevel degenerative changes throughout the lumbar spine with spinal and foraminal stenosis as described above. 3. Moderate spinal stenosis L2-3 with moderate subarticular and foraminal stenosis right greater than left 4. 7 mm anterolisthesis L5-S1 with possible pars defect on the left. Marked left foraminal encroachment with impingement left L5 nerveroot. 5. These results will be called to the ordering clinician or representative by the Radiologist Assistant, and communication documented in the PACS or [employer].  NO NEW EXAMS.    LABS REVIEWED PREVIOUS  03-08-20: wbc 18.9 hgb 16.7; hct 51.1; mcv 98.1 plt 143; glucose 100; bun 29; creat 1.28; k= 3.7; na++ 140; ca 9.4; total bili 2.9; albumin 3.2; tsh 0.492; mag 2.5 03-09-20: blood cultures: no growth 03-10-20: wbc 17.0; hgb 16.4; hct 52.2; mcv 100.4 plt 108; glucose 112; bun 29; creat 1.25; k+ 3.7; an++ 140; ca 8.9  03-13-20: wbc 13.8; hgb 15.1; hct 49.4 mcv 103.1 plt 85; glucose 94; bun 21; creat 0.87; k+ 3.7; na++ 140; ca 8.1  03-14-20: wbc 19.4; hgb 16.7; hct 52.7; mcv 101.3 plt 87; glucose 114; bun 20; creat 0.74; k+ 3.9; na++ 140; ca 8.3; liver normal albumin 2.7 03-17-20: glucose 82; bun 20; creat 0.67; k+ 3.8; na++ 138; ca 8.2 liver  normal albumin 2.3 03-18-20: wbc 10.4; hgb 15.9; hct 50.7 mcv 101.4; plt 84; glucose 105; bun 23; creat 0.70; k+ 4.3; na++ 140; ca 9.1 03-24-20: wbc 7.8; hgb 13.9; hct 43.6; mcv 101.4 plt 100; glucose 99; bun 22; creat 0.80; k+ 3.7; na++ 140; ca 8.5  05-27-20: wbc 10.3; hgb 14.1; hct 44.3; mcv 102.1 plt 199; glucose 99 bun 22; creat 0.8; k+ 3.7; na++ 140; ca 9.6 liver normal albumin 3.2 BNP 281; tsh 0.465; mag 2.0; urine culture no growth 05-30-20: wbc 13.2; hgb 13.0; hct 40.5; mcv 102.0 plt 197  07-28-20: vit D 93.39  TODAY  11-10-20: wbc 7.3; hgb 13.7; hct 44.4; mcv 100.9 plt 156; glucose 75; bun 13; creat 0.73; k+ 3.8; na++ 149; ca 9.2 liver normal albumin 3.1 chol 83; ldl 40; trig 38; hdl 35; tsh 1.020 mag 1.9; vit D 52.71   Review of Systems  Constitutional: Negative for malaise/fatigue.  Respiratory: Negative for cough and shortness of breath.   Cardiovascular: Negative for chest pain, palpitations and leg swelling.  Gastrointestinal: Negative for abdominal pain, constipation and heartburn.  Musculoskeletal: Negative for back pain, joint pain and myalgias.  Skin: Negative.   Neurological: Negative for dizziness.  Psychiatric/Behavioral: The patient is not nervous/anxious.       Physical Exam Constitutional:      General: He is not in acute distress.    Appearance: He is well-developed. He is obese. He is not diaphoretic.  Neck:     Thyroid: No thyromegaly.  Cardiovascular:     Rate and Rhythm: Normal rate. Rhythm irregular.     Pulses: Normal pulses.     Heart sounds: Normal heart sounds.  Pulmonary:     Effort: Pulmonary effort is normal. No respiratory distress.     Breath sounds: Normal breath sounds.  Abdominal:     General: Bowel sounds are normal. There is no distension.     Palpations: Abdomen is soft.     Tenderness: There is no abdominal tenderness.  Musculoskeletal:        General: Normal range of motion.  Cervical back: Neck supple.     Right lower leg: No  edema.     Left lower leg: No edema.  Lymphadenopathy:     Cervical: No cervical adenopathy.  Skin:    General: Skin is warm and dry.     Comments: Wraps in place on bilateral lower extremities    Neurological:     Mental Status: He is alert. Mental status is at baseline.  Psychiatric:        Mood and Affect: Mood normal.        ASSESSMENT/ PLAN:  TODAY  1. Dyslipidemia: is stable LDL 40 trig 38  will stop crestor and fish oil will monitor his status.   2. Lumbar radiculopathy L5/S1 is stable will continue gabapentin 100 mg nightly   3. Atrial fibrillation chronic: is stable will continue toprol xl 12.5 mg daily for rate control and eliquis 5 mg twice daily   PREVIOUS  4. Acute on chronic heart failure with preserved ejection fraction diastolic dysfunction: is compensated will continue toprol xl 12.5 mg daily   5. OSA on BIPAP is stable will continue BIPAP nightly   6. GERD without esophagitis: is stable will continue protonix 40 mg daily   7. Chronic constipation: is stable will continue miralax twice daily and senna s 2 tabs daily   8. Chronic venous insufficiency: is without change wraps intact   9. Protein calorie malnutrition: severe: albumin 3.1 will continue supplements as directed  10. BPH without urinary obstruction: is stable will continue flomax 0.4 mg daily         MD is aware of resident's narcotic use and is in agreement with current plan of care. We will attempt to wean resident as appropriate.  Synthia Innocenteborah Candida Vetter NP [facility]  Contact [phone] Monday through Friday 8am- 5pm  After hours call [phone]

## 2020-12-15 ENCOUNTER — Encounter: Payer: Self-pay | Admitting: Adult Health

## 2020-12-15 ENCOUNTER — Non-Acute Institutional Stay (SKILLED_NURSING_FACILITY): Payer: Medicare Other | Admitting: Adult Health

## 2020-12-15 DIAGNOSIS — E669 Obesity, unspecified: Secondary | ICD-10-CM

## 2020-12-15 DIAGNOSIS — G4733 Obstructive sleep apnea (adult) (pediatric): Secondary | ICD-10-CM | POA: Diagnosis not present

## 2020-12-15 DIAGNOSIS — M5416 Radiculopathy, lumbar region: Secondary | ICD-10-CM | POA: Diagnosis not present

## 2020-12-15 DIAGNOSIS — Z9989 Dependence on other enabling machines and devices: Secondary | ICD-10-CM

## 2020-12-15 DIAGNOSIS — I482 Chronic atrial fibrillation, unspecified: Secondary | ICD-10-CM | POA: Diagnosis not present

## 2020-12-15 NOTE — Progress Notes (Signed)
Location:  [facility] Room Number: 119/P Place of Service:  SNF (31)   CODE STATUS: Full Code  No Known Allergies  Chief Complaint  Patient presents with  . Acute Visit    Care Plan Meeting     HPI:  We have come together for his care plan meeting. BIMS 14/15 mood 4/30. No reports of falls. He is independent to supervision with his adls. He is able to feed himself;. He is frequently incontinent of bladder and bowel. His weight is stable at 184.4 pounds he has a good appetite. There are no reports of uncontrolled pain. No reports of constipation; no reports of insomnia. He continues to be followed for his chronic illnesses including:  Obesity (BMI 30.0-39.9) Lumbar radiculopathy OSA on CPAP Atrial fibrillation chronic  Past Medical History:  Diagnosis Date  . A-fib (HCC)   . Cellulitis   . CHF (congestive heart failure) (HCC)   . Hemorrhoid   . Hypertension   . Prostate disorder     Past Surgical History:  Procedure Laterality Date  . BIOPSY  03/17/2020   Procedure: BIOPSY;  Surgeon: Corbin Ade, MD;  [facility];  Service: Endoscopy;;  gastric rectal  . ESOPHAGEAL DILATION N/A 03/17/2020   Procedure: ESOPHAGEAL DILATION;  Surgeon: Corbin Ade, MD;  [facility]: [facility];  Service: Endoscopy;  Laterality: N/A;  . ESOPHAGOGASTRODUODENOSCOPY (EGD) WITH PROPOFOL N/A 03/17/2020   Procedure: ESOPHAGOGASTRODUODENOSCOPY (EGD) WITH PROPOFOL;  Surgeon: Corbin Ade, MD;  [facility];  Service: Endoscopy;  Laterality: N/A;  . FLEXIBLE SIGMOIDOSCOPY N/A 03/17/2020   Procedure: FLEXIBLE SIGMOIDOSCOPY WITH PROPOFOL;  Surgeon: Corbin Ade, MD;  [facility]: [facility];  Service: Endoscopy;  Laterality: N/A;  . HEMORRHOID SURGERY     patient reports several hemorrhoid surgeries in the past    Social History   Socioeconomic History  . Marital status: Legally Separated    Spouse name: Not on file  . Number of children: Not  on file  . Years of education: Not on file  . Highest education level: Not on file  Occupational History  . Occupation: retired   Tobacco Use  . Smoking status: Former Games developer  . Smokeless tobacco: Never Used  Vaping Use  . Vaping Use: Never used  Substance and Sexual Activity  . Alcohol use: Yes    Comment: "very little"  . Drug use: Never  . Sexual activity: Not Currently  Other Topics Concern  . Not on file  Social History Narrative   On his third marriage x 18 years. Has 3 children and several grandchildren. His son lives in [place], [place]  and is primary contact. 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 .  Former smoker.    Long term resident of SNF       Social Determinants of Health   Financial Resource Strain: Not on file  Food Insecurity: Not on file  Transportation Needs: Not on file  Physical Activity: Not on file  Stress: Not on file  Social Connections: Not on file  Intimate Partner Violence: Not on file   Family History  Problem Relation Age of Onset  . Heart disease Mother   . Colon cancer Neg Hx       VITAL SIGNS BP 124/63   Pulse 84   Temp 98.7 F (37.1 C)   Ht 5\' 5"  (1.651 m)   Wt 184 lb 6.4 oz (83.6 kg)  SpO2 92%   BMI 30.69 kg/m   Outpatient Encounter Medications as of 12/15/2020  Medication Sig  . acetaminophen (TYLENOL) 325 MG tablet Take 2 tablets (650 mg total) by mouth every 6 (six) hours as needed for mild pain, fever or headache (or Fever >/= 101).  Marland Kitchen albuterol (VENTOLIN HFA) 108 (90 Base) MCG/ACT inhaler Inhale 2 puffs into the lungs every 6 (six) hours as needed for wheezing or shortness of breath.  Marland Kitchen apixaban (ELIQUIS) 5 MG TABS tablet Take 1 tablet (5 mg total) by mouth 2 (two) times daily.  Marland Kitchen ascorbic acid (VITAMIN C) 500 MG tablet Take 1,000 mg by mouth daily.  [name] [place]-Castor Oil (VENELEX) OINT Apply 1 application topically in the morning, at noon, and at bedtime.  . carboxymethylcellul-glycerin (OPTIVE)  0.5-0.9 % ophthalmic solution Place 1 drop into both eyes 4 (four) times daily. For dry eyes  . feeding supplement, ENSURE ENLIVE, (ENSURE ENLIVE) LIQD Take 237 mLs by mouth daily. To promote weight maintenance  . gabapentin (NEURONTIN) 100 MG capsule Take 1 capsule (100 mg total) by mouth at bedtime.  . metoprolol succinate (TOPROL-XL) 25 MG 24 hr tablet Take 0.5 tablets (12.5 mg total) by mouth daily.  . NON FORMULARY BiPap at setting of 20/16 while sleeping. Twice A Day  . NON FORMULARY Diet: _____ Regular, __x____ NAS, _______Consistent Carbohydrate, _______NPO _____Other  . OXYGEN Inhale 2 L into the lungs continuous.   . pantoprazole (PROTONIX) 40 MG tablet Take 1 tablet (40 mg total) by mouth daily.  . polyethylene glycol (MIRALAX / GLYCOLAX) 17 g packet Take 17 g by mouth daily as needed.   . sodium chloride (OCEAN) 0.65 % SOLN nasal spray Place 2 sprays into both nostrils every 6 (six) hours as needed (For Dry Stuffy Nose).  . tamsulosin (FLOMAX) 0.4 MG CAPS capsule Take 1 capsule (0.4 mg total) by mouth daily after supper.  . [DISCONTINUED] Omega-3 Fatty Acids (FISH OIL BURP-LESS PO) Take 2 capsules by mouth daily. For a total of 2000 mg daily  . [DISCONTINUED] rosuvastatin (CRESTOR) 10 MG tablet Take 1 tablet (10 mg total) by mouth in the morning.   No facility-administered encounter medications on file as of 12/15/2020.     SIGNIFICANT DIAGNOSTIC EXAMS  PREVIOUS  05-27-20: chest x-ray:  1. No acute abnormality. 2. Stable cardiomegaly and mild chronic bronchitic changes. 3. Stable elevated right hemidiaphragm  05-27-20: ct of head: Mild atrophy. No acute abnormality   05-27-20: MRI of brain:  No acute abnormality Motion degraded study Atrophy with mild chronic microvascular ischemic change in the white matter.  05-27-20: MRI of lumbar spine:  1. Image quality degraded by moderate motion. 2. Moderate levoscoliosis lumbar spine. Multilevel degenerative changes throughout  the lumbar spine with spinal and foraminal stenosis as described above. 3. Moderate spinal stenosis L2-3 with moderate subarticular and foraminal stenosis right greater than left 4. 7 mm anterolisthesis L5-S1 with possible pars defect on the left. Marked left foraminal encroachment with impingement left L5 nerveroot. 5. These results will be called to the ordering clinician or representative by the Radiologist Assistant, and communication documented in the PACS or [employer].  NO NEW EXAMS.    LABS REVIEWED PREVIOUS  03-08-20: wbc 18.9 hgb 16.7; hct 51.1; mcv 98.1 plt 143; glucose 100; bun 29; creat 1.28; k= 3.7; na++ 140; ca 9.4; total bili 2.9; albumin 3.2; tsh 0.492; mag 2.5 03-09-20: blood cultures: no growth 03-10-20: wbc 17.0; hgb 16.4; hct 52.2; mcv 100.4 plt 108; glucose  112; bun 29; creat 1.25; k+ 3.7; an++ 140; ca 8.9  03-13-20: wbc 13.8; hgb 15.1; hct 49.4 mcv 103.1 plt 85; glucose 94; bun 21; creat 0.87; k+ 3.7; na++ 140; ca 8.1  03-14-20: wbc 19.4; hgb 16.7; hct 52.7; mcv 101.3 plt 87; glucose 114; bun 20; creat 0.74; k+ 3.9; na++ 140; ca 8.3; liver normal albumin 2.7 03-17-20: glucose 82; bun 20; creat 0.67; k+ 3.8; na++ 138; ca 8.2 liver normal albumin 2.3 03-18-20: wbc 10.4; hgb 15.9; hct 50.7 mcv 101.4; plt 84; glucose 105; bun 23; creat 0.70; k+ 4.3; na++ 140; ca 9.1 03-24-20: wbc 7.8; hgb 13.9; hct 43.6; mcv 101.4 plt 100; glucose 99; bun 22; creat 0.80; k+ 3.7; na++ 140; ca 8.5  05-27-20: wbc 10.3; hgb 14.1; hct 44.3; mcv 102.1 plt 199; glucose 99 bun 22; creat 0.8; k+ 3.7; na++ 140; ca 9.6 liver normal albumin 3.2 BNP 281; tsh 0.465; mag 2.0; urine culture no growth 05-30-20: wbc 13.2; hgb 13.0; hct 40.5; mcv 102.0 plt 197  07-28-20: vit D 93.39 11-10-20: wbc 7.3; hgb 13.7; hct 44.4; mcv 100.9 plt 156; glucose 75; bun 13; creat 0.73; k+ 3.8; na++ 149; ca 9.2 liver normal albumin 3.1 chol 83; ldl 40; trig 38; hdl 35; tsh 1.020 mag 1.9; vit D 52.71  NO NEW LABS.    Review of  Systems  Constitutional: Negative for malaise/fatigue.  Respiratory: Negative for cough and shortness of breath.   Cardiovascular: Negative for chest pain, palpitations and leg swelling.  Gastrointestinal: Negative for abdominal pain, constipation and heartburn.  Musculoskeletal: Negative for back pain, joint pain and myalgias.  Skin: Negative.   Neurological: Negative for dizziness.  Psychiatric/Behavioral: The patient is not nervous/anxious.     Physical Exam Constitutional:      General: He is not in acute distress.    Appearance: He is well-developed and well-nourished. He is obese. He is not diaphoretic.  Neck:     Thyroid: No thyromegaly.  Cardiovascular:     Rate and Rhythm: Normal rate. Rhythm irregular.     Pulses: Normal pulses and intact distal pulses.     Heart sounds: Normal heart sounds.  Pulmonary:     Effort: Pulmonary effort is normal. No respiratory distress.     Breath sounds: Normal breath sounds.  Abdominal:     General: Bowel sounds are normal. There is no distension.     Palpations: Abdomen is soft.     Tenderness: There is no abdominal tenderness.  Musculoskeletal:        General: No edema. Normal range of motion.     Cervical back: Neck supple.     Right lower leg: No edema.     Left lower leg: No edema.  Lymphadenopathy:     Cervical: No cervical adenopathy.  Skin:    General: Skin is warm and dry.     Comments: Wraps in place on bilateral lower extremities     Neurological:     Mental Status: He is alert. Mental status is at baseline.  Psychiatric:        Mood and Affect: Mood and affect and mood normal.       ASSESSMENT/ PLAN:  TODAY  1. Obesity (BMI 30.0-39.9) 2. Lumbar radiculopathy 3. OSA on CPAP 4. Atrial fibrillation chronic  Will continue current medications Will continue current plan of care He is a long term resident Will continue to monitor his status.   MD is aware of resident's narcotic use and is in agreement  with  current plan of care. We will attempt to wean resident as appropriate.  Synthia Innocent NP [facility]  Contact [phone] Monday through Friday 8am- 5pm  After hours call [phone]

## 2021-01-02 ENCOUNTER — Ambulatory Visit: Payer: Medicare Other | Admitting: Surgery

## 2021-01-06 ENCOUNTER — Non-Acute Institutional Stay (SKILLED_NURSING_FACILITY): Payer: Medicare Other | Admitting: Adult Health

## 2021-01-06 ENCOUNTER — Encounter: Payer: Self-pay | Admitting: Adult Health

## 2021-01-06 DIAGNOSIS — K219 Gastro-esophageal reflux disease without esophagitis: Secondary | ICD-10-CM

## 2021-01-06 DIAGNOSIS — I5033 Acute on chronic diastolic (congestive) heart failure: Secondary | ICD-10-CM

## 2021-01-06 DIAGNOSIS — G4733 Obstructive sleep apnea (adult) (pediatric): Secondary | ICD-10-CM

## 2021-01-06 DIAGNOSIS — Z9989 Dependence on other enabling machines and devices: Secondary | ICD-10-CM

## 2021-01-06 NOTE — Progress Notes (Signed)
Location:  [facility] Room Number: 119/P Place of Service:  SNF (31)   CODE STATUS: Full Code  No Known Allergies  Chief Complaint  Patient presents with  . Medical Management of Chronic Issues    Routine Visit of Medical Management   .           Acute on chronic heart failure with preserved ejection fraction   OSA is on BIPAP  GERD without esophagitis:    HPI:  He is a 87 year old long term resident of this facility being seen for the management of his chronic illnesses:Acute on chronic heart failure with preserved ejection fraction   OSA is on BIPAP  GERD without esophagitis:. There are no reports of heart burn; no cough or shortness of breath. No uncontrolled pain.    Past Medical History:  Diagnosis Date  . A-fib (HCC)   . Cellulitis   . CHF (congestive heart failure) (HCC)   . Hemorrhoid   . Hypertension   . Prostate disorder     Past Surgical History:  Procedure Laterality Date  . BIOPSY  03/17/2020   Procedure: BIOPSY;  Surgeon: Corbin Ade, MD;  [facility];  Service: Endoscopy;;  gastric rectal  . ESOPHAGEAL DILATION N/A 03/17/2020   Procedure: ESOPHAGEAL DILATION;  Surgeon: Corbin Ade, MD;  [facility]: [facility];  Service: Endoscopy;  Laterality: N/A;  . ESOPHAGOGASTRODUODENOSCOPY (EGD) WITH PROPOFOL N/A 03/17/2020   Procedure: ESOPHAGOGASTRODUODENOSCOPY (EGD) WITH PROPOFOL;  Surgeon: Corbin Ade, MD;  [facility];  Service: Endoscopy;  Laterality: N/A;  . FLEXIBLE SIGMOIDOSCOPY N/A 03/17/2020   Procedure: FLEXIBLE SIGMOIDOSCOPY WITH PROPOFOL;  Surgeon: Corbin Ade, MD;  [facility]: [facility];  Service: Endoscopy;  Laterality: N/A;  . HEMORRHOID SURGERY     patient reports several hemorrhoid surgeries in the past    Social History   Socioeconomic History  . Marital status: Legally Separated    Spouse name: Not on file  . Number of children: Not on file  . Years of education: Not on file  .  Highest education level: Not on file  Occupational History  . Occupation: retired   Tobacco Use  . Smoking status: Former Games developer  . Smokeless tobacco: Never Used  Vaping Use  . Vaping Use: Never used  Substance and Sexual Activity  . Alcohol use: Yes    Comment: "very little"  . Drug use: Never  . Sexual activity: Not Currently  Other Topics Concern  . Not on file  Social History Narrative   On his third marriage x 18 years. Has 3 children and several grandchildren. His son lives in [place], [place]  and is primary contact. 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 .  Former smoker.    Long term resident of SNF       Social Determinants of Health   Financial Resource Strain: Not on file  Food Insecurity: Not on file  Transportation Needs: Not on file  Physical Activity: Not on file  Stress: Not on file  Social Connections: Not on file  Intimate Partner Violence: Not on file   Family History  Problem Relation Age of Onset  . Heart disease Mother   . Colon cancer Neg Hx       VITAL SIGNS BP 119/70   Pulse 80   Temp 98.1 F (36.7 C)   Ht 5\' 5"  (1.651 m)   Wt 178 lb 3.2  oz (80.8 kg)   SpO2 92%   BMI 29.65 kg/m   Outpatient Encounter Medications as of 01/06/2021  Medication Sig  . acetaminophen (TYLENOL) 325 MG tablet Take 2 tablets (650 mg total) by mouth every 6 (six) hours as needed for mild pain, fever or headache (or Fever >/= 101).  Marland Kitchen albuterol (VENTOLIN HFA) 108 (90 Base) MCG/ACT inhaler Inhale 2 puffs into the lungs every 6 (six) hours as needed for wheezing or shortness of breath.  Marland Kitchen apixaban (ELIQUIS) 5 MG TABS tablet Take 1 tablet (5 mg total) by mouth 2 (two) times daily.  Marland Kitchen ascorbic acid (VITAMIN C) 500 MG tablet Take 1,000 mg by mouth daily.  Roseanne Kaufman Peru-Castor Oil (VENELEX) OINT Apply 1 application topically in the morning, at noon, and at bedtime.  . carboxymethylcellul-glycerin (OPTIVE) 0.5-0.9 % ophthalmic solution Place 1 drop into both  eyes 4 (four) times daily. For dry eyes  . feeding supplement, ENSURE ENLIVE, (ENSURE ENLIVE) LIQD Take 237 mLs by mouth daily. To promote weight maintenance  . gabapentin (NEURONTIN) 100 MG capsule Take 1 capsule (100 mg total) by mouth at bedtime.  . metoprolol succinate (TOPROL-XL) 25 MG 24 hr tablet Take 0.5 tablets (12.5 mg total) by mouth daily.  . NON FORMULARY BiPap at setting of 20/16 while sleeping. Twice A Day  . NON FORMULARY Diet: _____ Regular, __x____ NAS, _______Consistent Carbohydrate, _______NPO _____Other  . OXYGEN Inhale 2 L into the lungs continuous.   . pantoprazole (PROTONIX) 40 MG tablet Take 1 tablet (40 mg total) by mouth daily.  . polyethylene glycol (MIRALAX / GLYCOLAX) 17 g packet Take 17 g by mouth daily as needed.   . sodium chloride (OCEAN) 0.65 % SOLN nasal spray Place 2 sprays into both nostrils every 6 (six) hours as needed (For Dry Stuffy Nose).  . tamsulosin (FLOMAX) 0.4 MG CAPS capsule Take 1 capsule (0.4 mg total) by mouth daily after supper.   No facility-administered encounter medications on file as of 01/06/2021.     SIGNIFICANT DIAGNOSTIC EXAMS   PREVIOUS  05-27-20: chest x-ray:  1. No acute abnormality. 2. Stable cardiomegaly and mild chronic bronchitic changes. 3. Stable elevated right hemidiaphragm  05-27-20: ct of head: Mild atrophy. No acute abnormality   05-27-20: MRI of brain:  No acute abnormality Motion degraded study Atrophy with mild chronic microvascular ischemic change in the white matter.  05-27-20: MRI of lumbar spine:  1. Image quality degraded by moderate motion. 2. Moderate levoscoliosis lumbar spine. Multilevel degenerative changes throughout the lumbar spine with spinal and foraminal stenosis as described above. 3. Moderate spinal stenosis L2-3 with moderate subarticular and foraminal stenosis right greater than left 4. 7 mm anterolisthesis L5-S1 with possible pars defect on the left. Marked left foraminal  encroachment with impingement left L5 nerveroot. 5. These results will be called to the ordering clinician or representative by the Radiologist Assistant, and communication documented in the PACS or [employer].  NO NEW EXAMS.    LABS REVIEWED PREVIOUS  03-08-20: wbc 18.9 hgb 16.7; hct 51.1; mcv 98.1 plt 143; glucose 100; bun 29; creat 1.28; k= 3.7; na++ 140; ca 9.4; total bili 2.9; albumin 3.2; tsh 0.492; mag 2.5 03-09-20: blood cultures: no growth 03-10-20: wbc 17.0; hgb 16.4; hct 52.2; mcv 100.4 plt 108; glucose 112; bun 29; creat 1.25; k+ 3.7; an++ 140; ca 8.9  03-13-20: wbc 13.8; hgb 15.1; hct 49.4 mcv 103.1 plt 85; glucose 94; bun 21; creat 0.87; k+ 3.7; na++ 140; ca 8.1  03-14-20: wbc 19.4; hgb 16.7; hct 52.7; mcv 101.3 plt 87; glucose 114; bun 20; creat 0.74; k+ 3.9; na++ 140; ca 8.3; liver normal albumin 2.7 03-17-20: glucose 82; bun 20; creat 0.67; k+ 3.8; na++ 138; ca 8.2 liver normal albumin 2.3 03-18-20: wbc 10.4; hgb 15.9; hct 50.7 mcv 101.4; plt 84; glucose 105; bun 23; creat 0.70; k+ 4.3; na++ 140; ca 9.1 03-24-20: wbc 7.8; hgb 13.9; hct 43.6; mcv 101.4 plt 100; glucose 99; bun 22; creat 0.80; k+ 3.7; na++ 140; ca 8.5  05-27-20: wbc 10.3; hgb 14.1; hct 44.3; mcv 102.1 plt 199; glucose 99 bun 22; creat 0.8; k+ 3.7; na++ 140; ca 9.6 liver normal albumin 3.2 BNP 281; tsh 0.465; mag 2.0; urine culture no growth 05-30-20: wbc 13.2; hgb 13.0; hct 40.5; mcv 102.0 plt 197  07-28-20: vit D 93.39 11-10-20: wbc 7.3; hgb 13.7; hct 44.4; mcv 100.9 plt 156; glucose 75; bun 13; creat 0.73; k+ 3.8; na++ 149; ca 9.2 liver normal albumin 3.1 chol 83; ldl 40; trig 38; hdl 35; tsh 1.020 mag 1.9; vit D 52.71  NO NEW LABS.    Review of Systems  Constitutional: Negative for malaise/fatigue.  Respiratory: Negative for cough and shortness of breath.   Cardiovascular: Negative for chest pain, palpitations and leg swelling.  Gastrointestinal: Negative for abdominal pain, constipation and heartburn.   Musculoskeletal: Negative for back pain, joint pain and myalgias.  Skin: Negative.   Neurological: Negative for dizziness.  Psychiatric/Behavioral: The patient is not nervous/anxious.     Physical Exam Constitutional:      General: He is not in acute distress.    Appearance: He is well-developed and well-nourished. He is obese. He is not diaphoretic.  Neck:     Thyroid: No thyromegaly.  Cardiovascular:     Rate and Rhythm: Normal rate. Rhythm irregular.     Pulses: Normal pulses and intact distal pulses.     Heart sounds: Normal heart sounds.  Pulmonary:     Effort: Pulmonary effort is normal. No respiratory distress.     Breath sounds: Normal breath sounds.  Abdominal:     General: Bowel sounds are normal. There is no distension.     Palpations: Abdomen is soft.     Tenderness: There is no abdominal tenderness.  Musculoskeletal:        General: No edema. Normal range of motion.     Cervical back: Neck supple.     Right lower leg: No edema.     Left lower leg: No edema.  Lymphadenopathy:     Cervical: No cervical adenopathy.  Skin:    General: Skin is warm and dry.     Comments:  Wraps in place on bilateral lower extremities      Neurological:     Mental Status: He is alert. Mental status is at baseline.  Psychiatric:        Mood and Affect: Mood and affect and mood normal.           ASSESSMENT/ PLAN:  TODAY  1. Acute on chronic heart failure with preserved ejection fraction diastolic dysfunction is compensated will continue toprol xl 12.5 mg daily  2. OSA is on BIPAP is stable will continue bipap nightly   3. GERD without esophagitis: is stable will continue protonix 40 mg daily    PREVIOUS  4. Chronic constipation: is stable will continue miralax twice daily and senna s 2 tabs daily   5. Chronic venous insufficiency: is without change wraps intact  6. Protein calorie malnutrition: severe: albumin 3.1 will continue supplements as directed  7. BPH  without urinary obstruction: is stable will continue flomax 0.4 mg daily   8. Dyslipidemia: is stable LDL 40 trig 38  Is off statin and fish oil  will monitor his status.   9. Lumbar radiculopathy L5/S1 is stable will continue gabapentin 100 mg nightly   10. Atrial fibrillation chronic: is stable will continue toprol xl 12.5 mg daily for rate control and eliquis 5 mg twice daily         MD is aware of resident's narcotic use and is in agreement with current plan of care. We will attempt to wean resident as appropriate.  Synthia Innocent NP [facility]  Contact [phone] Monday through Friday 8am- 5pm  After hours call [phone]

## 2021-01-13 DIAGNOSIS — I700 Atherosclerosis of aorta: Secondary | ICD-10-CM | POA: Insufficient documentation

## 2021-01-16 ENCOUNTER — Ambulatory Visit: Payer: Medicare Other | Admitting: Surgery

## 2021-01-24 ENCOUNTER — Other Ambulatory Visit (HOSPITAL_COMMUNITY)
Admission: RE | Admit: 2021-01-24 | Discharge: 2021-01-24 | Disposition: A | Discharge status: Home / Self Care | Payer: Medicare Other | Source: Skilled Nursing Facility | Attending: Adult Health | Admitting: Adult Health

## 2021-01-24 DIAGNOSIS — I110 Hypertensive heart disease with heart failure: Secondary | ICD-10-CM | POA: Diagnosis present

## 2021-01-24 LAB — BASIC METABOLIC PANEL
Anion gap: 8 (ref 5–15)
BUN: 12 mg/dL (ref 8–23)
CO2: 30 mmol/L (ref 22–32)
Calcium: 9.4 mg/dL (ref 8.9–10.3)
Chloride: 104 mmol/L (ref 98–111)
Creatinine, Ser: 0.64 mg/dL (ref 0.61–1.24)
GFR, Estimated: >60 mL/min (ref >60)
Glucose, Bld: 91 mg/dL (ref 70–99)
Potassium: 4 mmol/L (ref 3.5–5.1)
Sodium: 142 mmol/L (ref 135–145)

## 2021-01-24 LAB — CBC WITH DIFFERENTIAL/PLATELET
Abs Immature Granulocytes: 0.01 10*3/uL (ref 0.00–0.07)
Basophils Absolute: 0.1 10*3/uL (ref 0.0–0.1)
Basophils Relative: 1 %
Eosinophils Absolute: 0.3 10*3/uL (ref 0.0–0.5)
Eosinophils Relative: 3 %
HCT: 48.8 % (ref 39.0–52.0)
Hemoglobin: 15.4 g/dL (ref 13.0–17.0)
Immature Granulocytes: 0 %
Lymphocytes Relative: 28 %
Lymphs Abs: 2.3 10*3/uL (ref 0.7–4.0)
MCH: 31.1 pg (ref 26.0–34.0)
MCHC: 31.6 g/dL (ref 30.0–36.0)
MCV: 98.6 fL (ref 80.0–100.0)
Monocytes Absolute: 0.6 10*3/uL (ref 0.1–1.0)
Monocytes Relative: 8 %
Neutro Abs: 5.1 10*3/uL (ref 1.7–7.7)
Neutrophils Relative %: 60 %
Platelets: 174 10*3/uL (ref 150–400)
RBC: 4.95 MIL/uL (ref 4.22–5.81)
RDW: 15.8 % — ABNORMAL HIGH (ref 11.5–15.5)
WBC: 8.4 10*3/uL (ref 4.0–10.5)
nRBC: 0 % (ref 0.0–0.2)

## 2021-01-25 ENCOUNTER — Encounter: Payer: Self-pay | Admitting: Adult Health

## 2021-01-25 ENCOUNTER — Non-Acute Institutional Stay (SKILLED_NURSING_FACILITY): Payer: Medicare Other | Admitting: Adult Health

## 2021-01-25 DIAGNOSIS — J189 Pneumonia, unspecified organism: Secondary | ICD-10-CM

## 2021-01-25 NOTE — Progress Notes (Signed)
Location:  [facility] Room Number: 119/P Place of Service:  SNF (31)   CODE STATUS: Full Codehe  No Known Allergies  Chief Complaint  Patient presents with  . Follow-up    Follow Up Chest X-ray    HPI:  He has a cough with low 02 sats at night. He is wearing his BIPAP at night. His BIPAP is a nose mask; however he has been found mouth breathing.  He denies any shortness of breath; he denies any sputum production. There are no reports of fevers present.   Past Medical History:  Diagnosis Date  . A-fib (HCC)   . Cellulitis   . CHF (congestive heart failure) (HCC)   . Hemorrhoid   . Hypertension   . Prostate disorder     Past Surgical History:  Procedure Laterality Date  . BIOPSY  03/17/2020   Procedure: BIOPSY;  Surgeon: Corbin Ade, MD;  [facility];  Service: Endoscopy;;  gastric rectal  . ESOPHAGEAL DILATION N/A 03/17/2020   Procedure: ESOPHAGEAL DILATION;  Surgeon: Corbin Ade, MD;  [facility];  Service: Endoscopy;  Laterality: N/A;  . ESOPHAGOGASTRODUODENOSCOPY (EGD) WITH PROPOFOL N/A 03/17/2020   Procedure: ESOPHAGOGASTRODUODENOSCOPY (EGD) WITH PROPOFOL;  Surgeon: Corbin Ade, MD;  [facility];  Service: Endoscopy;  Laterality: N/A;  . FLEXIBLE SIGMOIDOSCOPY N/A 03/17/2020   Procedure: FLEXIBLE SIGMOIDOSCOPY WITH PROPOFOL;  Surgeon: Corbin Ade, MD;  [facility];  Service: Endoscopy;  Laterality: N/A;  . HEMORRHOID SURGERY     patient reports several hemorrhoid surgeries in the past    Social History   Socioeconomic History  . Marital status: Legally Separated    Spouse name: Not on file  . Number of children: Not on file  . Years of education: Not on file  . Highest education level: Not on file  Occupational History  . Occupation: retired   Tobacco Use  . Smoking status: Former Games developer  . Smokeless tobacco: Never Used  Vaping Use  . Vaping Use: Never used  Substance and  Sexual Activity  . Alcohol use: Yes    Comment: "very little"  . Drug use: Never  . Sexual activity: Not Currently  Other Topics Concern  . Not on file  Social History Narrative   On his third marriage x 18 years. Has 3 children and several grandchildren. His son lives in [place], [place]  and is primary contact. 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 .  Former smoker.    Long term resident of SNF       Social Determinants of Health   Financial Resource Strain: Not on file  Food Insecurity: Not on file  Transportation Needs: Not on file  Physical Activity: Not on file  Stress: Not on file  Social Connections: Not on file  Intimate Partner Violence: Not on file   Family History  Problem Relation Age of Onset  . Heart disease Mother   . Colon cancer Neg Hx       VITAL SIGNS BP 123/68   Pulse 74   Temp (!) 97.4 F (36.3 C)   Ht 5\' 5"  (1.651 m)   Wt 178 lb 3.2 oz (80.8 kg)   SpO2 90%   BMI 29.65 kg/m   Outpatient Encounter Medications as of 01/25/2021  Medication Sig  . acetaminophen (TYLENOL) 325 MG tablet Take 2 tablets (650 mg total) by mouth every 6 (six) hours as  needed for mild pain, fever or headache (or Fever >/= 101).  Marland Kitchen albuterol (VENTOLIN HFA) 108 (90 Base) MCG/ACT inhaler Inhale 2 puffs into the lungs every 6 (six) hours as needed for wheezing or shortness of breath.  Marland Kitchen apixaban (ELIQUIS) 5 MG TABS tablet Take 1 tablet (5 mg total) by mouth 2 (two) times daily.  Marland Kitchen ascorbic acid (VITAMIN C) 500 MG tablet Take 1,000 mg by mouth daily.  [name] [place]-Castor Oil (VENELEX) OINT Apply 1 application topically in the morning, at noon, and at bedtime.  . carboxymethylcellul-glycerin (OPTIVE) 0.5-0.9 % ophthalmic solution Place 1 drop into both eyes 4 (four) times daily. For dry eyes  . feeding supplement, ENSURE ENLIVE, (ENSURE ENLIVE) LIQD Take 237 mLs by mouth daily. To promote weight maintenance  . gabapentin (NEURONTIN) 100 MG capsule Take 1 capsule  (100 mg total) by mouth at bedtime.  . metoprolol succinate (TOPROL-XL) 25 MG 24 hr tablet Take 0.5 tablets (12.5 mg total) by mouth daily.  . NON FORMULARY BiPap at setting of 20/16 while sleeping. Twice A Day  . NON FORMULARY Diet: _____ Regular, __x____ NAS, _______Consistent Carbohydrate, _______NPO _____Other  . OXYGEN Inhale 2 L into the lungs continuous.   . pantoprazole (PROTONIX) 40 MG tablet Take 1 tablet (40 mg total) by mouth daily.  . polyethylene glycol (MIRALAX / GLYCOLAX) 17 g packet Take 17 g by mouth daily as needed.   . sodium chloride (OCEAN) 0.65 % SOLN nasal spray Place 2 sprays into both nostrils every 6 (six) hours as needed (For Dry Stuffy Nose).  . tamsulosin (FLOMAX) 0.4 MG CAPS capsule Take 1 capsule (0.4 mg total) by mouth daily after supper.   No facility-administered encounter medications on file as of 01/25/2021.     SIGNIFICANT DIAGNOSTIC EXAMS   PREVIOUS  05-27-20: chest x-ray:  1. No acute abnormality. 2. Stable cardiomegaly and mild chronic bronchitic changes. 3. Stable elevated right hemidiaphragm  05-27-20: ct of head: Mild atrophy. No acute abnormality   05-27-20: MRI of brain:  No acute abnormality Motion degraded study Atrophy with mild chronic microvascular ischemic change in the white matter.  05-27-20: MRI of lumbar spine:  1. Image quality degraded by moderate motion. 2. Moderate levoscoliosis lumbar spine. Multilevel degenerative changes throughout the lumbar spine with spinal and foraminal stenosis as described above. 3. Moderate spinal stenosis L2-3 with moderate subarticular and foraminal stenosis right greater than left 4. 7 mm anterolisthesis L5-S1 with possible pars defect on the left. Marked left foraminal encroachment with impingement left L5 nerveroot. 5. These results will be called to the ordering clinician or representative by the Radiologist Assistant, and communication documented in the PACS or [employer].  TODAY  01-24-21: chest x-ray: may reflect CHF; multifocal pneumonia or both conditions    LABS REVIEWED PREVIOUS  03-08-20: wbc 18.9 hgb 16.7; hct 51.1; mcv 98.1 plt 143; glucose 100; bun 29; creat 1.28; k= 3.7; na++ 140; ca 9.4; total bili 2.9; albumin 3.2; tsh 0.492; mag 2.5 03-09-20: blood cultures: no growth 03-10-20: wbc 17.0; hgb 16.4; hct 52.2; mcv 100.4 plt 108; glucose 112; bun 29; creat 1.25; k+ 3.7; an++ 140; ca 8.9  03-13-20: wbc 13.8; hgb 15.1; hct 49.4 mcv 103.1 plt 85; glucose 94; bun 21; creat 0.87; k+ 3.7; na++ 140; ca 8.1  03-14-20: wbc 19.4; hgb 16.7; hct 52.7; mcv 101.3 plt 87; glucose 114; bun 20; creat 0.74; k+ 3.9; na++ 140; ca 8.3; liver normal albumin 2.7 03-17-20: glucose 82; bun 20; creat  0.67; k+ 3.8; na++ 138; ca 8.2 liver normal albumin 2.3 03-18-20: wbc 10.4; hgb 15.9; hct 50.7 mcv 101.4; plt 84; glucose 105; bun 23; creat 0.70; k+ 4.3; na++ 140; ca 9.1 03-24-20: wbc 7.8; hgb 13.9; hct 43.6; mcv 101.4 plt 100; glucose 99; bun 22; creat 0.80; k+ 3.7; na++ 140; ca 8.5  05-27-20: wbc 10.3; hgb 14.1; hct 44.3; mcv 102.1 plt 199; glucose 99 bun 22; creat 0.8; k+ 3.7; na++ 140; ca 9.6 liver normal albumin 3.2 BNP 281; tsh 0.465; mag 2.0; urine culture no growth 05-30-20: wbc 13.2; hgb 13.0; hct 40.5; mcv 102.0 plt 197  07-28-20: vit D 93.39 11-10-20: wbc 7.3; hgb 13.7; hct 44.4; mcv 100.9 plt 156; glucose 75; bun 13; creat 0.73; k+ 3.8; na++ 149; ca 9.2 liver normal albumin 3.1 chol 83; ldl 40; trig 38; hdl 35; tsh 1.020 mag 1.9; vit D 52.71  TODAY  01-24-21: wbc 8.4; hgb 15.4; hct 48.8; mcv 98.6 plt 174; glucose 91; bun 12; creat 0.64; k+ 4.0; na++ 142; ca 9.4 GFR >60  Review of Systems  Constitutional: Negative for malaise/fatigue.  Respiratory: Positive for cough. Negative for shortness of breath.   Cardiovascular: Negative for chest pain, palpitations and leg swelling.  Gastrointestinal: Negative for abdominal pain, constipation and heartburn.   Musculoskeletal: Negative for back pain, joint pain and myalgias.  Skin: Negative.   Neurological: Negative for dizziness.  Psychiatric/Behavioral: The patient is not nervous/anxious.    Physical Exam Constitutional:      General: He is not in acute distress.    Appearance: He is well-developed and well-nourished. He is obese. He is not diaphoretic.  HENT:     Nose: Congestion present.  Neck:     Thyroid: No thyromegaly.  Cardiovascular:     Rate and Rhythm: Normal rate. Rhythm irregular.     Pulses: Intact distal pulses.     Heart sounds: Normal heart sounds.  Pulmonary:     Effort: Pulmonary effort is normal. No respiratory distress.     Breath sounds: Wheezing, rhonchi and rales present.     Comments: On left  Abdominal:     General: Bowel sounds are normal. There is no distension.     Palpations: Abdomen is soft.     Tenderness: There is no abdominal tenderness.  Musculoskeletal:        General: No edema. Normal range of motion.     Right lower leg: No edema.     Left lower leg: No edema.  Lymphadenopathy:     Cervical: No cervical adenopathy.  Skin:    General: Skin is warm and dry.     Comments: Wraps in place on bilateral lower extremities       Neurological:     Mental Status: He is alert and oriented to person, place, and time.  Psychiatric:        Mood and Affect: Mood and affect normal.      ASSESSMENT/ PLAN:  TODAY  1. HCAP; will begin a zpck and will monitor his status Will have him refitted for a different mack which covers nose and mouth  Will monitor his status.    MD is aware of resident's narcotic use and is in agreement with current plan of care. We will attempt to wean resident as appropriate.  Synthia Innocent NP [facility]  Contact [phone] Monday through Friday 8am- 5pm  After hours call [phone]

## 2021-01-27 ENCOUNTER — Encounter: Payer: Self-pay | Admitting: Adult Health

## 2021-01-27 NOTE — Progress Notes (Signed)
Location:  [facility] Room Number: 119/P Place of Service:  SNF (31)   CODE STATUS: Full Code  No Known Allergies  Chief Complaint  Patient presents with  . Acute Visit    Right Eye Redness     HPI:    Past Medical History:  Diagnosis Date  . A-fib (HCC)   . Cellulitis   . CHF (congestive heart failure) (HCC)   . Hemorrhoid   . Hypertension   . Prostate disorder     Past Surgical History:  Procedure Laterality Date  . BIOPSY  03/17/2020   Procedure: BIOPSY;  Surgeon: Corbin Ade, MD;  [facility];  Service: Endoscopy;;  gastric rectal  . ESOPHAGEAL DILATION N/A 03/17/2020   Procedure: ESOPHAGEAL DILATION;  Surgeon: Corbin Ade, MD;  [facility];  Service: Endoscopy;  Laterality: N/A;  . ESOPHAGOGASTRODUODENOSCOPY (EGD) WITH PROPOFOL N/A 03/17/2020   Procedure: ESOPHAGOGASTRODUODENOSCOPY (EGD) WITH PROPOFOL;  Surgeon: Corbin Ade, MD;  [facility];  Service: Endoscopy;  Laterality: N/A;  . FLEXIBLE SIGMOIDOSCOPY N/A 03/17/2020   Procedure: FLEXIBLE SIGMOIDOSCOPY WITH PROPOFOL;  Surgeon: Corbin Ade, MD;  [facility];  Service: Endoscopy;  Laterality: N/A;  . HEMORRHOID SURGERY     patient reports several hemorrhoid surgeries in the past    Social History   Socioeconomic History  . Marital status: Legally Separated    Spouse name: Not on file  . Number of children: Not on file  . Years of education: Not on file  . Highest education level: Not on file  Occupational History  . Occupation: retired   Tobacco Use  . Smoking status: Former Games developer  . Smokeless tobacco: Never Used  Vaping Use  . Vaping Use: Never used  Substance and Sexual Activity  . Alcohol use: Yes    Comment: "very little"  . Drug use: Never  . Sexual activity: Not Currently  Other Topics Concern  . Not on file  Social History Narrative   On his third marriage x 18 years. Has 3 children and several  grandchildren. His son lives in [place], [place]  and is primary contact. 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 .  Former smoker.    Long term resident of SNF       Social Determinants of Health   Financial Resource Strain: Not on file  Food Insecurity: Not on file  Transportation Needs: Not on file  Physical Activity: Not on file  Stress: Not on file  Social Connections: Not on file  Intimate Partner Violence: Not on file   Family History  Problem Relation Age of Onset  . Heart disease Mother   . Colon cancer Neg Hx       VITAL SIGNS BP 123/68   Pulse 74   Temp 98.2 F (36.8 C)   Ht 5\' 5"  (1.651 m)   Wt 178 lb 3.2 oz (80.8 kg)   SpO2 92%   BMI 29.65 kg/m   Outpatient Encounter Medications as of 01/27/2021  Medication Sig  . acetaminophen (TYLENOL) 325 MG tablet Take 2 tablets (650 mg total) by mouth every 6 (six) hours as needed for mild pain, fever or headache (or Fever >/= 101).  01/29/2021 albuterol (VENTOLIN HFA) 108 (90 Base) MCG/ACT inhaler Inhale 2 puffs into the lungs every 6 (six) hours as needed for wheezing or shortness of breath.  Marland Kitchen apixaban (ELIQUIS) 5 MG TABS tablet Take 1  tablet (5 mg total) by mouth 2 (two) times daily.  Marland Kitchen ascorbic acid (VITAMIN C) 500 MG tablet Take 1,000 mg by mouth daily.  Marland Kitchen azithromycin (ZITHROMAX) 250 MG tablet Take 250 mg by mouth daily.  [name] [place]-Castor Oil (VENELEX) OINT Apply 1 application topically in the morning, at noon, and at bedtime.  . carboxymethylcellul-glycerin (OPTIVE) 0.5-0.9 % ophthalmic solution Place 1 drop into both eyes 4 (four) times daily. For dry eyes  . feeding supplement, ENSURE ENLIVE, (ENSURE ENLIVE) LIQD Take 237 mLs by mouth daily. To promote weight maintenance  . gabapentin (NEURONTIN) 100 MG capsule Take 1 capsule (100 mg total) by mouth at bedtime.  . metoprolol succinate (TOPROL-XL) 25 MG 24 hr tablet Take 0.5 tablets (12.5 mg total) by mouth daily.  . NON FORMULARY BiPap at setting of  20/16 while sleeping. Twice A Day  . NON FORMULARY Diet: _____ Regular, __x____ NAS, _______Consistent Carbohydrate, _______NPO _____Other  . OXYGEN Inhale 2 L into the lungs continuous.   . pantoprazole (PROTONIX) 40 MG tablet Take 1 tablet (40 mg total) by mouth daily.  . polyethylene glycol (MIRALAX / GLYCOLAX) 17 g packet Take 17 g by mouth daily as needed.   . sodium chloride (OCEAN) 0.65 % SOLN nasal spray Place 2 sprays into both nostrils every 6 (six) hours as needed (For Dry Stuffy Nose).  . tamsulosin (FLOMAX) 0.4 MG CAPS capsule Take 1 capsule (0.4 mg total) by mouth daily after supper.   No facility-administered encounter medications on file as of 01/27/2021.     SIGNIFICANT DIAGNOSTIC EXAMS       ASSESSMENT/ PLAN:    MD is aware of resident's narcotic use and is in agreement with current plan of care. We will attempt to wean resident as appropriate.  Synthia Innocent NP [facility]  Contact [phone] Monday through Friday 8am- 5pm  After hours call [phone]

## 2021-01-28 DIAGNOSIS — J189 Pneumonia, unspecified organism: Secondary | ICD-10-CM | POA: Insufficient documentation

## 2021-01-30 NOTE — Progress Notes (Signed)
This encounter was created in error - please disregard.

## 2021-02-01 DIAGNOSIS — M5416 Radiculopathy, lumbar region: Secondary | ICD-10-CM | POA: Diagnosis not present

## 2021-02-01 DIAGNOSIS — Z1159 Encounter for screening for other viral diseases: Secondary | ICD-10-CM | POA: Diagnosis not present

## 2021-02-03 ENCOUNTER — Non-Acute Institutional Stay (SKILLED_NURSING_FACILITY): Payer: Medicare Other | Admitting: Internal Medicine

## 2021-02-03 ENCOUNTER — Encounter: Payer: Self-pay | Admitting: Internal Medicine

## 2021-02-03 DIAGNOSIS — G4733 Obstructive sleep apnea (adult) (pediatric): Secondary | ICD-10-CM | POA: Diagnosis not present

## 2021-02-03 DIAGNOSIS — Z9989 Dependence on other enabling machines and devices: Secondary | ICD-10-CM

## 2021-02-03 DIAGNOSIS — Z1159 Encounter for screening for other viral diseases: Secondary | ICD-10-CM | POA: Diagnosis not present

## 2021-02-03 DIAGNOSIS — I872 Venous insufficiency (chronic) (peripheral): Secondary | ICD-10-CM | POA: Diagnosis not present

## 2021-02-03 DIAGNOSIS — M5416 Radiculopathy, lumbar region: Secondary | ICD-10-CM | POA: Diagnosis not present

## 2021-02-03 DIAGNOSIS — I482 Chronic atrial fibrillation, unspecified: Secondary | ICD-10-CM

## 2021-02-03 DIAGNOSIS — E43 Unspecified severe protein-calorie malnutrition: Secondary | ICD-10-CM | POA: Diagnosis not present

## 2021-02-03 NOTE — Assessment & Plan Note (Addendum)
.  Rate controlled; continue novel anticoagulant lifelong. Monitor for any GI bleed due to past history of GERD with esophageal stricture

## 2021-02-03 NOTE — Patient Instructions (Signed)
See assessment and plan under each diagnosis in the problem list and acutely for this visit 

## 2021-02-03 NOTE — Progress Notes (Signed)
NURSING HOME LOCATION:  [facility] ROOM NUMBER: 119/P   CODE STATUS:  Full Code  PCP: Sharee Holster, NP   This is a nursing facility follow up of chronic medical diagnoses & to document compliance with Regulation 483.30 (c) in The Long Term Care Survey Manual Phase 2 which mandates caregiver visit ( visits can alternate among physician, PA or NP as per statutes) within 10 days of 30 days / 60 days/ 90 days post admission to SNF date    Interim medical record and care since last [facility] visit was updated with review of diagnostic studies and change in clinical status since last visit were documented.  HPI: He is a permanent resident of facility with medical diagnoses of essential hypertension, A. fib, GERD with history of esophageal stricture, peripheral neuropathy, protein/caloric malnutrition, prostatism, OSA, and congestive heart failure. Procedures include esophageal dilation. He is on a novel anticoagulant for the A. fib. He is a former smoker; alcohol intake was minimal.  Review of systems: He says that his shortness of breath is stable.  He attributes dyspnea to "I only have 1&1/2 lungs".  He is compliant with his CPAP for his OSA.  He denies any active symptoms of CHF.  He states that he had does have occasional dysphagia but no other GI symptoms. He has tingling in the extremities under the "gauze" he also has tingling in his groin related to yeast infection.  He does describe nocturia.  Constitutional: No fever, significant weight change, fatigue  Eyes: No redness, discharge, pain, vision change ENT/mouth: No nasal congestion,  purulent discharge, earache, change in hearing, sore throat  Cardiovascular: No chest pain, palpitations, paroxysmal nocturnal dyspnea, claudication, edema  Respiratory: No cough, sputum production, hemoptysis   Gastrointestinal: No heartburn, abdominal pain, nausea /vomiting, rectal bleeding, melena, change in  bowels Genitourinary: No dysuria, hematuria, pyuria, incontinence Musculoskeletal: No joint stiffness, joint swelling, weakness, pain Dermatologic: No rash, pruritus, change in appearance of skin Neurologic: No dizziness, headache, syncope, seizures Psychiatric: No significant anxiety, depression, insomnia, anorexia Endocrine: No change in hair/skin/nails, excessive thirst, excessive hunger, excessive urination  Hematologic/lymphatic: No significant bruising, lymphadenopathy, abnormal bleeding Allergy/immunology: No itchy/watery eyes, significant sneezing, urticaria, angioedema  Physical exam:  Pertinent or positive findings: He sits in the wheelchair with his neck flexed and his head tilted to the right.  Hair is well groomed.  Affect tends to be flat.  There is proptosis of the globes suggested.  There is some irregular hyperpigmentation of the lower lids.  Eyebrows are decreased laterally. Second heart sound is increased.  Heart rhythm is slightly irregular. Breath sounds decreased. Abdomen is protuberant.  His legs are wrapped and there is no visible edema.  Pedal pulses cannot be palpated because of the wrapping.  The upper extremities are weaker than the lower extremities to opposition.  General appearance: Adequately nourished; no acute distress, increased work of breathing is present.   Lymphatic: No lymphadenopathy about the head, neck, axilla. Eyes: No conjunctival inflammation or lid edema is present. There is no scleral icterus. Ears:  External ear exam shows no significant lesions or deformities.   Nose:  External nasal examination shows no deformity or inflammation. Nasal mucosa are pink and moist without lesions, exudates Oral exam:  Lips and gums are healthy appearing. There is no oropharyngeal erythema or exudate. Neck:  No thyromegaly, masses, tenderness noted.    Heart:  No gallop, murmur, click, rub .  Lungs: without wheezes, rhonchi, rales, rubs.  Abdomen: Bowel sounds are  normal. Abdomen is soft and nontender with no organomegaly, hernias, masses. GU: Deferred  Extremities:  No cyanosis, clubbing, edema  Neurologic exam :Balance, Rhomberg, finger to nose testing could not be completed due to clinical state Skin: Warm & dry w/o tenting. No significant visable lesions or rash, but legs not unwrapped for exam.  See summary under each active problem in the Problem List with associated updated therapeutic plan

## 2021-02-03 NOTE — Assessment & Plan Note (Addendum)
Wound Care Nurse reports that ulcers have healed. Profore wraps are being continued to prevent recurrence due to peripheral edema.

## 2021-02-04 NOTE — Assessment & Plan Note (Signed)
Compliant with CPAP 

## 2021-02-04 NOTE — Assessment & Plan Note (Signed)
11/10/2020 albumin 3.2 & TP 6.1. Compliance with Ensure Alive as therapeutic intervention for peripheral edema & wound healing discussed.

## 2021-02-06 ENCOUNTER — Ambulatory Visit: Payer: Medicare Other | Admitting: Surgery

## 2021-02-06 DIAGNOSIS — Z1159 Encounter for screening for other viral diseases: Secondary | ICD-10-CM | POA: Diagnosis not present

## 2021-02-06 DIAGNOSIS — M5416 Radiculopathy, lumbar region: Secondary | ICD-10-CM | POA: Diagnosis not present

## 2021-02-08 DIAGNOSIS — Z1159 Encounter for screening for other viral diseases: Secondary | ICD-10-CM | POA: Diagnosis not present

## 2021-02-08 DIAGNOSIS — M5416 Radiculopathy, lumbar region: Secondary | ICD-10-CM | POA: Diagnosis not present

## 2021-02-10 DIAGNOSIS — M5416 Radiculopathy, lumbar region: Secondary | ICD-10-CM | POA: Diagnosis not present

## 2021-02-10 DIAGNOSIS — Z1159 Encounter for screening for other viral diseases: Secondary | ICD-10-CM | POA: Diagnosis not present

## 2021-02-13 DIAGNOSIS — I739 Peripheral vascular disease, unspecified: Secondary | ICD-10-CM | POA: Diagnosis not present

## 2021-02-13 DIAGNOSIS — L602 Onychogryphosis: Secondary | ICD-10-CM | POA: Diagnosis not present

## 2021-02-13 DIAGNOSIS — Z1159 Encounter for screening for other viral diseases: Secondary | ICD-10-CM | POA: Diagnosis not present

## 2021-02-13 DIAGNOSIS — M5416 Radiculopathy, lumbar region: Secondary | ICD-10-CM | POA: Diagnosis not present

## 2021-02-14 ENCOUNTER — Non-Acute Institutional Stay (SKILLED_NURSING_FACILITY): Payer: Medicare Other | Admitting: Adult Health

## 2021-02-14 ENCOUNTER — Encounter: Payer: Self-pay | Admitting: Adult Health

## 2021-02-14 DIAGNOSIS — N492 Inflammatory disorders of scrotum: Secondary | ICD-10-CM

## 2021-02-14 NOTE — Progress Notes (Signed)
Location:  [facility] Room Number: 119-P Place of Service:  SNF (31)   CODE STATUS: Full Code   No Known Allergies  Chief Complaint  Patient presents with  . Acute Visit    Open area on scrotum     HPI:  Staff report today that he has an open area on his scrotum. There are reports of fevers; no complaints of pain. The area looks like a burst cyst. The area is red; hard and swollen with bloody drainage present. He denies picking at the area.    Past Medical History:  Diagnosis Date  . A-fib (HCC)   . Cellulitis   . CHF (congestive heart failure) (HCC)   . Hemorrhoid   . Hypertension   . Prostate disorder     Past Surgical History:  Procedure Laterality Date  . BIOPSY  03/17/2020   Procedure: BIOPSY;  Surgeon: Corbin Ade, MD;  [facility];  Service: Endoscopy;;  gastric rectal  . ESOPHAGEAL DILATION N/A 03/17/2020   Procedure: ESOPHAGEAL DILATION;  Surgeon: Corbin Ade, MD;  [facility];  Service: Endoscopy;  Laterality: N/A;  . ESOPHAGOGASTRODUODENOSCOPY (EGD) WITH PROPOFOL N/A 03/17/2020   Procedure: ESOPHAGOGASTRODUODENOSCOPY (EGD) WITH PROPOFOL;  Surgeon: Corbin Ade, MD;  [facility];  Service: Endoscopy;  Laterality: N/A;  . FLEXIBLE SIGMOIDOSCOPY N/A 03/17/2020   Procedure: FLEXIBLE SIGMOIDOSCOPY WITH PROPOFOL;  Surgeon: Corbin Ade, MD;  [facility];  Service: Endoscopy;  Laterality: N/A;  . HEMORRHOID SURGERY     patient reports several hemorrhoid surgeries in the past    Social History   Socioeconomic History  . Marital status: Legally Separated    Spouse name: Not on file  . Number of children: Not on file  . Years of education: Not on file  . Highest education level: Not on file  Occupational History  . Occupation: retired   Tobacco Use  . Smoking status: Former Games developer  . Smokeless tobacco: Never Used  Vaping Use  . Vaping Use: Never used  Substance and Sexual  Activity  . Alcohol use: Yes    Comment: "very little"  . Drug use: Never  . Sexual activity: Not Currently  Other Topics Concern  . Not on file  Social History Narrative   On his third marriage x 18 years. Has 3 children and several grandchildren. His son lives in [place], [place]  and is primary contact. 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 .  Former smoker.    Long term resident of SNF       Social Determinants of Health   Financial Resource Strain: Not on file  Food Insecurity: Not on file  Transportation Needs: Not on file  Physical Activity: Not on file  Stress: Not on file  Social Connections: Not on file  Intimate Partner Violence: Not on file   Family History  Problem Relation Age of Onset  . Heart disease Mother   . Colon cancer Neg Hx       VITAL SIGNS BP 111/67   Temp 98.2 F (36.8 C)   Resp 20   Ht 5\' 5"  (1.651 m)   Wt 174 lb 12.8 oz (79.3 kg)   SpO2 92%   BMI 29.09 kg/m   Outpatient Encounter Medications as of 02/14/2021  Medication Sig  . acetaminophen (TYLENOL) 325 MG tablet Take 2 tablets (650 mg total) by mouth every 6 (six) hours as needed  for mild pain, fever or headache (or Fever >/= 101).  Marland Kitchen albuterol (VENTOLIN HFA) 108 (90 Base) MCG/ACT inhaler Inhale 2 puffs into the lungs every 6 (six) hours as needed for wheezing or shortness of breath.  Marland Kitchen apixaban (ELIQUIS) 5 MG TABS tablet Take 1 tablet (5 mg total) by mouth 2 (two) times daily.  . Ascorbic Acid (VITAMIN C) 1000 MG tablet Take 1,000 mg by mouth daily.  [name] [place]-Castor Oil (VENELEX) OINT Apply 1 application topically in the morning, at noon, and at bedtime.  . carboxymethylcellul-glycerin (OPTIVE) 0.5-0.9 % ophthalmic solution Place 1 drop into both eyes 4 (four) times daily. For dry eyes  . gabapentin (NEURONTIN) 100 MG capsule Take 1 capsule (100 mg total) by mouth at bedtime.  . metoprolol succinate (TOPROL-XL) 25 MG 24 hr tablet Take 0.5 tablets (12.5 mg total) by  mouth daily.  . NON FORMULARY BiPap at setting of 20/16 while sleeping. Twice A Day  . NON FORMULARY Diet: _____ Regular, __x____ NAS, _______Consistent Carbohydrate, _______NPO _____Other  . OXYGEN Inhale 2 L into the lungs continuous.   . pantoprazole (PROTONIX) 40 MG tablet Take 1 tablet (40 mg total) by mouth daily.  . polyethylene glycol (MIRALAX / GLYCOLAX) 17 g packet Take 17 g by mouth daily as needed.   . sodium chloride (OCEAN) 0.65 % SOLN nasal spray Place 2 sprays into both nostrils every 6 (six) hours as needed (For Dry Stuffy Nose).  . tamsulosin (FLOMAX) 0.4 MG CAPS capsule Take 1 capsule (0.4 mg total) by mouth daily after supper.  . [DISCONTINUED] ascorbic acid (VITAMIN C) 500 MG tablet Take 1,000 mg by mouth daily.  . [DISCONTINUED] feeding supplement, ENSURE ENLIVE, (ENSURE ENLIVE) LIQD Take 237 mLs by mouth daily. To promote weight maintenance   No facility-administered encounter medications on file as of 02/14/2021.     SIGNIFICANT DIAGNOSTIC EXAMS  PREVIOUS  05-27-20: chest x-ray:  1. No acute abnormality. 2. Stable cardiomegaly and mild chronic bronchitic changes. 3. Stable elevated right hemidiaphragm  05-27-20: ct of head: Mild atrophy. No acute abnormality   05-27-20: MRI of brain:  No acute abnormality Motion degraded study Atrophy with mild chronic microvascular ischemic change in the white matter.  05-27-20: MRI of lumbar spine:  1. Image quality degraded by moderate motion. 2. Moderate levoscoliosis lumbar spine. Multilevel degenerative changes throughout the lumbar spine with spinal and foraminal stenosis as described above. 3. Moderate spinal stenosis L2-3 with moderate subarticular and foraminal stenosis right greater than left 4. 7 mm anterolisthesis L5-S1 with possible pars defect on the left. Marked left foraminal encroachment with impingement left L5 nerveroot. 5. These results will be called to the ordering clinician or representative by the  Radiologist Assistant, and communication documented in the PACS or [employer].  01-24-21: chest x-ray: may reflect CHF; multifocal pneumonia or both conditions   NO NEW EXAMS.    LABS REVIEWED PREVIOUS  03-08-20: wbc 18.9 hgb 16.7; hct 51.1; mcv 98.1 plt 143; glucose 100; bun 29; creat 1.28; k= 3.7; na++ 140; ca 9.4; total bili 2.9; albumin 3.2; tsh 0.492; mag 2.5 03-09-20: blood cultures: no growth 03-10-20: wbc 17.0; hgb 16.4; hct 52.2; mcv 100.4 plt 108; glucose 112; bun 29; creat 1.25; k+ 3.7; an++ 140; ca 8.9  03-13-20: wbc 13.8; hgb 15.1; hct 49.4 mcv 103.1 plt 85; glucose 94; bun 21; creat 0.87; k+ 3.7; na++ 140; ca 8.1  03-14-20: wbc 19.4; hgb 16.7; hct 52.7; mcv 101.3 plt 87; glucose 114; bun 20;  creat 0.74; k+ 3.9; na++ 140; ca 8.3; liver normal albumin 2.7 03-17-20: glucose 82; bun 20; creat 0.67; k+ 3.8; na++ 138; ca 8.2 liver normal albumin 2.3 03-18-20: wbc 10.4; hgb 15.9; hct 50.7 mcv 101.4; plt 84; glucose 105; bun 23; creat 0.70; k+ 4.3; na++ 140; ca 9.1 03-24-20: wbc 7.8; hgb 13.9; hct 43.6; mcv 101.4 plt 100; glucose 99; bun 22; creat 0.80; k+ 3.7; na++ 140; ca 8.5  05-27-20: wbc 10.3; hgb 14.1; hct 44.3; mcv 102.1 plt 199; glucose 99 bun 22; creat 0.8; k+ 3.7; na++ 140; ca 9.6 liver normal albumin 3.2 BNP 281; tsh 0.465; mag 2.0; urine culture no growth 05-30-20: wbc 13.2; hgb 13.0; hct 40.5; mcv 102.0 plt 197  07-28-20: vit D 93.39 11-10-20: wbc 7.3; hgb 13.7; hct 44.4; mcv 100.9 plt 156; glucose 75; bun 13; creat 0.73; k+ 3.8; na++ 149; ca 9.2 liver normal albumin 3.1 chol 83; ldl 40; trig 38; hdl 35; tsh 1.020 mag 1.9; vit D 52.71 01-24-21: wbc 8.4; hgb 15.4; hct 48.8; mcv 98.6 plt 174; glucose 91; bun 12; creat 0.64; k+ 4.0; na++ 142; ca 9.4 GFR >60  NO NEW LABS.   Review of Systems  Constitutional: Negative for malaise/fatigue.  Respiratory: Negative for cough and shortness of breath.   Cardiovascular: Negative for chest pain, palpitations and leg swelling.   Gastrointestinal: Negative for abdominal pain, constipation and heartburn.  Musculoskeletal: Negative for back pain, joint pain and myalgias.  Skin:       Sore on scrotum   Neurological: Negative for dizziness.  Psychiatric/Behavioral: The patient is not nervous/anxious.     Physical Exam Constitutional:      General: He is not in acute distress.    Appearance: He is well-developed and well-nourished. He is obese. He is not diaphoretic.  Neck:     Thyroid: No thyromegaly.  Cardiovascular:     Rate and Rhythm: Normal rate and regular rhythm.     Pulses: Normal pulses and intact distal pulses.     Heart sounds: Normal heart sounds.  Pulmonary:     Effort: Pulmonary effort is normal. No respiratory distress.     Breath sounds: Normal breath sounds.  Abdominal:     General: Bowel sounds are normal. There is no distension.     Palpations: Abdomen is soft.     Tenderness: There is no abdominal tenderness.  Musculoskeletal:        General: No edema. Normal range of motion.     Cervical back: Neck supple.     Right lower leg: No edema.     Left lower leg: No edema.  Lymphadenopathy:     Cervical: No cervical adenopathy.  Skin:    General: Skin is warm and dry.     Comments: Bilateral legs in wraps  Scrotum: area is red swollen and hard with bloody drainage present. No pain present.   Neurological:     Mental Status: He is alert and oriented to person, place, and time.  Psychiatric:        Mood and Affect: Mood and affect normal.        ASSESSMENT/ PLAN:  TODAY  1. Scrotal abscess: is worse: will begin keflex 500 mg twice daily through 02-21-21 will monitor his status     MD is aware of resident's narcotic use and is in agreement with current plan of care. We will attempt to wean resident as appropriate.  Synthia Innocent NP [facility]  Contact [phone] Monday through  Friday 8am- 5pm  After hours call [phone]

## 2021-02-15 DIAGNOSIS — N492 Inflammatory disorders of scrotum: Secondary | ICD-10-CM | POA: Insufficient documentation

## 2021-02-15 DIAGNOSIS — Z1159 Encounter for screening for other viral diseases: Secondary | ICD-10-CM | POA: Diagnosis not present

## 2021-02-15 DIAGNOSIS — M5416 Radiculopathy, lumbar region: Secondary | ICD-10-CM | POA: Diagnosis not present

## 2021-03-03 ENCOUNTER — Encounter: Payer: Self-pay | Admitting: Adult Health

## 2021-03-03 ENCOUNTER — Non-Acute Institutional Stay (SKILLED_NURSING_FACILITY): Payer: Medicare Other | Admitting: Adult Health

## 2021-03-03 DIAGNOSIS — E43 Unspecified severe protein-calorie malnutrition: Secondary | ICD-10-CM | POA: Diagnosis not present

## 2021-03-03 DIAGNOSIS — K5909 Other constipation: Secondary | ICD-10-CM

## 2021-03-03 DIAGNOSIS — I872 Venous insufficiency (chronic) (peripheral): Secondary | ICD-10-CM

## 2021-03-03 NOTE — Progress Notes (Signed)
Location:  [facility] Room Number: 222 Place of Service:  SNF (31)   CODE STATUS: full   No Known Allergies  Chief Complaint  Patient presents with  . Medical Management of Chronic Issues           Chronic constipation:   Chronic venous insuffiencey: Protein calorie malnutrition; severe;    HPI:  He is a 87 year old long term resident of this facility being seen for the management of his chronic illnesses: Chronic constipation:   Chronic venous insuffiencey: Protein calorie malnutrition; severe. There are no reports of uncontrolled pain; no changes in appetite.   Past Medical History:  Diagnosis Date  . A-fib (HCC)   . Cellulitis   . CHF (congestive heart failure) (HCC)   . Hemorrhoid   . Hypertension   . Prostate disorder     Past Surgical History:  Procedure Laterality Date  . BIOPSY  03/17/2020   Procedure: BIOPSY;  Surgeon: Corbin Ade, MD;  [facility]: [facility];  Service: Endoscopy;;  gastric rectal  . ESOPHAGEAL DILATION N/A 03/17/2020   Procedure: ESOPHAGEAL DILATION;  Surgeon: Corbin Ade, MD;  [facility]: [facility];  Service: Endoscopy;  Laterality: N/A;  . ESOPHAGOGASTRODUODENOSCOPY (EGD) WITH PROPOFOL N/A 03/17/2020   Procedure: ESOPHAGOGASTRODUODENOSCOPY (EGD) WITH PROPOFOL;  Surgeon: Corbin Ade, MD;  [facility]: [facility];  Service: Endoscopy;  Laterality: N/A;  . FLEXIBLE SIGMOIDOSCOPY N/A 03/17/2020   Procedure: FLEXIBLE SIGMOIDOSCOPY WITH PROPOFOL;  Surgeon: Corbin Ade, MD;  [facility]: [facility];  Service: Endoscopy;  Laterality: N/A;  . HEMORRHOID SURGERY     patient reports several hemorrhoid surgeries in the past    Social History   Socioeconomic History  . Marital status: Legally Separated    Spouse name: Not on file  . Number of children: Not on file  . Years of education: Not on file  . Highest education level: Not on file  Occupational History  . Occupation: retired   Tobacco Use  . Smoking  status: Former Games developer  . Smokeless tobacco: Never Used  Vaping Use  . Vaping Use: Never used  Substance and Sexual Activity  . Alcohol use: Yes    Comment: "very little"  . Drug use: Never  . Sexual activity: Not Currently  Other Topics Concern  . Not on file  Social History Narrative   On his third marriage x 18 years. Has 3 children and several grandchildren. His son lives in [place], [place]  and is primary contact. 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 .  Former smoker.    Long term resident of SNF       Social Determinants of Health   Financial Resource Strain: Not on file  Food Insecurity: Not on file  Transportation Needs: Not on file  Physical Activity: Not on file  Stress: Not on file  Social Connections: Not on file  Intimate Partner Violence: Not on file   Family History  Problem Relation Age of Onset  . Heart disease Mother   . Colon cancer Neg Hx       VITAL SIGNS BP 133/81   Pulse 76   Temp 98.1 F (36.7 C)   Resp 20   Ht 5\' 5"  (1.651 m)   Wt 171 lb 6.4 oz (77.7 kg)   SpO2 95%   BMI 28.52 kg/m   Outpatient Encounter Medications as of 03/03/2021  Medication Sig  . acetaminophen (TYLENOL)  325 MG tablet Take 2 tablets (650 mg total) by mouth every 6 (six) hours as needed for mild pain, fever or headache (or Fever >/= 101).  Marland Kitchen albuterol (VENTOLIN HFA) 108 (90 Base) MCG/ACT inhaler Inhale 2 puffs into the lungs every 6 (six) hours as needed for wheezing or shortness of breath.  Marland Kitchen apixaban (ELIQUIS) 5 MG TABS tablet Take 1 tablet (5 mg total) by mouth 2 (two) times daily.  . Ascorbic Acid (VITAMIN C) 1000 MG tablet Take 1,000 mg by mouth daily.  [name] [place]-Castor Oil (VENELEX) OINT Apply 1 application topically in the morning, at noon, and at bedtime.  . carboxymethylcellul-glycerin (OPTIVE) 0.5-0.9 % ophthalmic solution Place 1 drop into both eyes 4 (four) times daily. For dry eyes  . gabapentin (NEURONTIN) 100 MG capsule Take 1 capsule  (100 mg total) by mouth at bedtime.  . metoprolol succinate (TOPROL-XL) 25 MG 24 hr tablet Take 0.5 tablets (12.5 mg total) by mouth daily.  . NON FORMULARY BiPap at setting of 20/16 while sleeping. Twice A Day  . NON FORMULARY Diet: _____ Regular, __x____ NAS, _______Consistent Carbohydrate, _______NPO _____Other  . OXYGEN Inhale 2 L into the lungs continuous.   . pantoprazole (PROTONIX) 40 MG tablet Take 1 tablet (40 mg total) by mouth daily.  . polyethylene glycol (MIRALAX / GLYCOLAX) 17 g packet Take 17 g by mouth daily as needed.   . sodium chloride (OCEAN) 0.65 % SOLN nasal spray Place 2 sprays into both nostrils every 6 (six) hours as needed (For Dry Stuffy Nose).  . tamsulosin (FLOMAX) 0.4 MG CAPS capsule Take 1 capsule (0.4 mg total) by mouth daily after supper.   No facility-administered encounter medications on file as of 03/03/2021.     SIGNIFICANT DIAGNOSTIC EXAMS   PREVIOUS  05-27-20: chest x-ray:  1. No acute abnormality. 2. Stable cardiomegaly and mild chronic bronchitic changes. 3. Stable elevated right hemidiaphragm  05-27-20: ct of head: Mild atrophy. No acute abnormality   05-27-20: MRI of brain:  No acute abnormality Motion degraded study Atrophy with mild chronic microvascular ischemic change in the white matter.  05-27-20: MRI of lumbar spine:  1. Image quality degraded by moderate motion. 2. Moderate levoscoliosis lumbar spine. Multilevel degenerative changes throughout the lumbar spine with spinal and foraminal stenosis as described above. 3. Moderate spinal stenosis L2-3 with moderate subarticular and foraminal stenosis right greater than left 4. 7 mm anterolisthesis L5-S1 with possible pars defect on the left. Marked left foraminal encroachment with impingement left L5 nerveroot. 5. These results will be called to the ordering clinician or representative by the Radiologist Assistant, and communication documented in the PACS or [employer].  NO  NEW EXAMS.    LABS REVIEWED PREVIOUS  03-08-20: wbc 18.9 hgb 16.7; hct 51.1; mcv 98.1 plt 143; glucose 100; bun 29; creat 1.28; k= 3.7; na++ 140; ca 9.4; total bili 2.9; albumin 3.2; tsh 0.492; mag 2.5 03-09-20: blood cultures: no growth 03-10-20: wbc 17.0; hgb 16.4; hct 52.2; mcv 100.4 plt 108; glucose 112; bun 29; creat 1.25; k+ 3.7; an++ 140; ca 8.9  03-13-20: wbc 13.8; hgb 15.1; hct 49.4 mcv 103.1 plt 85; glucose 94; bun 21; creat 0.87; k+ 3.7; na++ 140; ca 8.1  03-14-20: wbc 19.4; hgb 16.7; hct 52.7; mcv 101.3 plt 87; glucose 114; bun 20; creat 0.74; k+ 3.9; na++ 140; ca 8.3; liver normal albumin 2.7 03-17-20: glucose 82; bun 20; creat 0.67; k+ 3.8; na++ 138; ca 8.2 liver normal albumin 2.3 03-18-20: wbc  10.4; hgb 15.9; hct 50.7 mcv 101.4; plt 84; glucose 105; bun 23; creat 0.70; k+ 4.3; na++ 140; ca 9.1 03-24-20: wbc 7.8; hgb 13.9; hct 43.6; mcv 101.4 plt 100; glucose 99; bun 22; creat 0.80; k+ 3.7; na++ 140; ca 8.5  05-27-20: wbc 10.3; hgb 14.1; hct 44.3; mcv 102.1 plt 199; glucose 99 bun 22; creat 0.8; k+ 3.7; na++ 140; ca 9.6 liver normal albumin 3.2 BNP 281; tsh 0.465; mag 2.0; urine culture no growth 05-30-20: wbc 13.2; hgb 13.0; hct 40.5; mcv 102.0 plt 197  07-28-20: vit D 93.39 11-10-20: wbc 7.3; hgb 13.7; hct 44.4; mcv 100.9 plt 156; glucose 75; bun 13; creat 0.73; k+ 3.8; na++ 149; ca 9.2 liver normal albumin 3.1 chol 83; ldl 40; trig 38; hdl 35; tsh 1.020 mag 1.9; vit D 52.71  TODAY  01-24-21: wbc 8.4; hgb 15.4; hct 48.8; mcv 98.6 plt 174; glucose 91; bun 12; creat 0.64; k+ 4.0; na++ 142; ca 9.4 GFR>60   Review of Systems  Constitutional: Negative for malaise/fatigue.  Respiratory: Negative for cough and shortness of breath.   Cardiovascular: Negative for chest pain, palpitations and leg swelling.  Gastrointestinal: Negative for abdominal pain, constipation and heartburn.  Musculoskeletal: Negative for back pain, joint pain and myalgias.  Skin: Negative.   Neurological: Negative for  dizziness.  Psychiatric/Behavioral: The patient is not nervous/anxious.      Physical Exam Constitutional:      General: He is not in acute distress.    Appearance: He is well-developed and well-nourished. He is obese. He is not diaphoretic.  Neck:     Thyroid: No thyromegaly.  Cardiovascular:     Rate and Rhythm: Normal rate and regular rhythm.     Pulses: Normal pulses and intact distal pulses.     Heart sounds: Normal heart sounds.  Pulmonary:     Effort: Pulmonary effort is normal. No respiratory distress.     Breath sounds: Normal breath sounds.  Abdominal:     General: Bowel sounds are normal. There is no distension.     Palpations: Abdomen is soft.     Tenderness: There is no abdominal tenderness.  Musculoskeletal:        General: No edema. Normal range of motion.     Cervical back: Neck supple.     Right lower leg: No edema.     Left lower leg: No edema.  Lymphadenopathy:     Cervical: No cervical adenopathy.  Skin:    General: Skin is warm and dry.     Comments: Bilateral lower extremities in wraps   Neurological:     Mental Status: He is alert and oriented to person, place, and time.  Psychiatric:        Mood and Affect: Mood and affect and mood normal.       ASSESSMENT/ PLAN:  TODAY  1. Chronic constipation: is stable will continue miralax twice daily and senna s 2 tabs daily   2. Chronic venous insuffiencey: is without change; bilateral legs in wraps  3. Protein calorie malnutrition; severe; albumin 3.1 will continue supplements as directed.    PREVIOUS  4. BPH without urinary obstruction: is stable will continue flomax 0.4 mg daily   5. Dyslipidemia: is stable LDL 40 trig 38  Is off statin and fish oil  will monitor his status.   6. Lumbar radiculopathy L5/S1 is stable will continue gabapentin 100 mg nightly   7. Atrial fibrillation chronic: is stable will continue toprol xl 12.5 mg daily  for rate control and eliquis 5 mg twice daily   8. Acute  on chronic heart failure with preserved ejection fraction diastolic dysfunction is compensated will continue toprol xl 12.5 mg daily  9. OSA is on BIPAP is stable will continue bipap nightly   10. GERD without esophagitis: is stable will continue protonix 40 mg daily    Synthia Innocenteborah Charvez Voorhies NP [facility]  Contact [phone] Monday through Friday 8am- 5pm  After hours call [phone]

## 2021-03-05 DIAGNOSIS — M5416 Radiculopathy, lumbar region: Secondary | ICD-10-CM | POA: Diagnosis not present

## 2021-03-05 DIAGNOSIS — M6281 Muscle weakness (generalized): Secondary | ICD-10-CM | POA: Diagnosis not present

## 2021-03-06 DIAGNOSIS — M6281 Muscle weakness (generalized): Secondary | ICD-10-CM | POA: Diagnosis not present

## 2021-03-06 DIAGNOSIS — M5416 Radiculopathy, lumbar region: Secondary | ICD-10-CM | POA: Diagnosis not present

## 2021-03-07 ENCOUNTER — Other Ambulatory Visit (HOSPITAL_COMMUNITY)
Admission: RE | Admit: 2021-03-07 | Discharge: 2021-03-07 | Disposition: A | Discharge status: Home / Self Care | Payer: Medicare Other | Source: Skilled Nursing Facility | Attending: Adult Health | Admitting: Adult Health

## 2021-03-07 DIAGNOSIS — M6281 Muscle weakness (generalized): Secondary | ICD-10-CM | POA: Insufficient documentation

## 2021-03-07 DIAGNOSIS — R5381 Other malaise: Secondary | ICD-10-CM | POA: Diagnosis not present

## 2021-03-07 LAB — BASIC METABOLIC PANEL
Anion gap: 9 (ref 5–15)
BUN: 15 mg/dL (ref 8–23)
CO2: 31 mmol/L (ref 22–32)
Calcium: 9.4 mg/dL (ref 8.9–10.3)
Chloride: 104 mmol/L (ref 98–111)
Creatinine, Ser: 0.75 mg/dL (ref 0.61–1.24)
GFR, Estimated: >60 mL/min (ref >60)
Glucose, Bld: 80 mg/dL (ref 70–99)
Potassium: 3.7 mmol/L (ref 3.5–5.1)
Sodium: 144 mmol/L (ref 135–145)

## 2021-03-07 LAB — CBC WITH DIFFERENTIAL/PLATELET
Abs Immature Granulocytes: 0.04 10*3/uL (ref 0.00–0.07)
Basophils Absolute: 0.1 10*3/uL (ref 0.0–0.1)
Basophils Relative: 1 %
Eosinophils Absolute: 0.2 10*3/uL (ref 0.0–0.5)
Eosinophils Relative: 2 %
HCT: 51.8 % (ref 39.0–52.0)
Hemoglobin: 16.3 g/dL (ref 13.0–17.0)
Immature Granulocytes: 0 %
Lymphocytes Relative: 24 %
Lymphs Abs: 2.3 10*3/uL (ref 0.7–4.0)
MCH: 31.2 pg (ref 26.0–34.0)
MCHC: 31.5 g/dL (ref 30.0–36.0)
MCV: 99.2 fL (ref 80.0–100.0)
Monocytes Absolute: 0.8 10*3/uL (ref 0.1–1.0)
Monocytes Relative: 9 %
Neutro Abs: 6.1 10*3/uL (ref 1.7–7.7)
Neutrophils Relative %: 64 %
Platelets: 179 10*3/uL (ref 150–400)
RBC: 5.22 MIL/uL (ref 4.22–5.81)
RDW: 16.4 % — ABNORMAL HIGH (ref 11.5–15.5)
WBC: 9.5 10*3/uL (ref 4.0–10.5)
nRBC: 0 % (ref 0.0–0.2)

## 2021-03-08 ENCOUNTER — Non-Acute Institutional Stay (SKILLED_NURSING_FACILITY): Payer: Medicare Other | Admitting: Adult Health

## 2021-03-08 ENCOUNTER — Encounter: Payer: Self-pay | Admitting: Adult Health

## 2021-03-08 DIAGNOSIS — I509 Heart failure, unspecified: Secondary | ICD-10-CM | POA: Diagnosis not present

## 2021-03-08 DIAGNOSIS — M5416 Radiculopathy, lumbar region: Secondary | ICD-10-CM | POA: Diagnosis not present

## 2021-03-08 DIAGNOSIS — I517 Cardiomegaly: Secondary | ICD-10-CM | POA: Diagnosis not present

## 2021-03-08 DIAGNOSIS — R0989 Other specified symptoms and signs involving the circulatory and respiratory systems: Secondary | ICD-10-CM | POA: Diagnosis not present

## 2021-03-08 DIAGNOSIS — N3000 Acute cystitis without hematuria: Secondary | ICD-10-CM | POA: Diagnosis not present

## 2021-03-08 DIAGNOSIS — M6281 Muscle weakness (generalized): Secondary | ICD-10-CM | POA: Diagnosis not present

## 2021-03-08 NOTE — Progress Notes (Signed)
Location:  [facility] Room Number: 119/P Place of Service:  SNF (31)   CODE STATUS: Full Code  No Known Allergies  Chief Complaint  Patient presents with  . Acute Visit    Status Change    HPI:  Staff is concerned about his overall status. He is more lethargic; has increased weakness present. He has increased lethargy he is in the bed; which is very unusual for him. He states that he has increased frequency and dysuria present. There are no reports of fevers present. His po intake is poor. His chest x-ray is stable.   Past Medical History:  Diagnosis Date  . A-fib (HCC)   . Cellulitis   . CHF (congestive heart failure) (HCC)   . Hemorrhoid   . Hypertension   . Prostate disorder     Past Surgical History:  Procedure Laterality Date  . BIOPSY  03/17/2020   Procedure: BIOPSY;  Surgeon: Corbin Ade, MD;  [facility];  Service: Endoscopy;;  gastric rectal  . ESOPHAGEAL DILATION N/A 03/17/2020   Procedure: ESOPHAGEAL DILATION;  Surgeon: Corbin Ade, MD;  [facility]: [facility];  Service: Endoscopy;  Laterality: N/A;  . ESOPHAGOGASTRODUODENOSCOPY (EGD) WITH PROPOFOL N/A 03/17/2020   Procedure: ESOPHAGOGASTRODUODENOSCOPY (EGD) WITH PROPOFOL;  Surgeon: Corbin Ade, MD;  [facility];  Service: Endoscopy;  Laterality: N/A;  . FLEXIBLE SIGMOIDOSCOPY N/A 03/17/2020   Procedure: FLEXIBLE SIGMOIDOSCOPY WITH PROPOFOL;  Surgeon: Corbin Ade, MD;  [facility]: [facility];  Service: Endoscopy;  Laterality: N/A;  . HEMORRHOID SURGERY     patient reports several hemorrhoid surgeries in the past    Social History   Socioeconomic History  . Marital status: Legally Separated    Spouse name: Not on file  . Number of children: Not on file  . Years of education: Not on file  . Highest education level: Not on file  Occupational History  . Occupation: retired   Tobacco Use  . Smoking status: Former Games developer  . Smokeless tobacco:  Never Used  Vaping Use  . Vaping Use: Never used  Substance and Sexual Activity  . Alcohol use: Yes    Comment: "very little"  . Drug use: Never  . Sexual activity: Not Currently  Other Topics Concern  . Not on file  Social History Narrative   On his third marriage x 18 years. Has 3 children and several grandchildren. His son lives in [place], [place]  and is primary contact. 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 .  Former smoker.    Long term resident of SNF       Social Determinants of Health   Financial Resource Strain: Not on file  Food Insecurity: Not on file  Transportation Needs: Not on file  Physical Activity: Not on file  Stress: Not on file  Social Connections: Not on file  Intimate Partner Violence: Not on file   Family History  Problem Relation Age of Onset  . Heart disease Mother   . Colon cancer Neg Hx       VITAL SIGNS BP (!) 147/84   Pulse (!) 56   Temp 98.2 F (36.8 C)   Ht 5\' 5"  (1.651 m)   Wt 171 lb 6.4 oz (77.7 kg)   SpO2 92%   BMI 28.52 kg/m   Outpatient Encounter Medications as of 03/08/2021  Medication Sig  . acetaminophen (TYLENOL) 325 MG tablet Take 2 tablets (650 mg  total) by mouth every 6 (six) hours as needed for mild pain, fever or headache (or Fever >/= 101).  Marland Kitchen albuterol (VENTOLIN HFA) 108 (90 Base) MCG/ACT inhaler Inhale 2 puffs into the lungs every 6 (six) hours as needed for wheezing or shortness of breath.  Marland Kitchen apixaban (ELIQUIS) 5 MG TABS tablet Take 1 tablet (5 mg total) by mouth 2 (two) times daily.  . Ascorbic Acid (VITAMIN C) 1000 MG tablet Take 1,000 mg by mouth daily.  [name] [place]-Castor Oil (VENELEX) OINT Apply 1 application topically in the morning, at noon, and at bedtime.  . carboxymethylcellul-glycerin (OPTIVE) 0.5-0.9 % ophthalmic solution Place 1 drop into both eyes 4 (four) times daily. For dry eyes  . gabapentin (NEURONTIN) 100 MG capsule Take 1 capsule (100 mg total) by mouth at bedtime.  .  metoprolol succinate (TOPROL-XL) 25 MG 24 hr tablet Take 0.5 tablets (12.5 mg total) by mouth daily.  . NON FORMULARY BiPap at setting of 20/16 while sleeping. Twice A Day  . NON FORMULARY Diet: _____ Regular, __x____ NAS, _______Consistent Carbohydrate, _______NPO _____Other  . OXYGEN Inhale 2 L into the lungs continuous.   . pantoprazole (PROTONIX) 40 MG tablet Take 1 tablet (40 mg total) by mouth daily.  . polyethylene glycol (MIRALAX / GLYCOLAX) 17 g packet Take 17 g by mouth daily as needed.   . sodium chloride (OCEAN) 0.65 % SOLN nasal spray Place 2 sprays into both nostrils every 6 (six) hours as needed (For Dry Stuffy Nose).  . tamsulosin (FLOMAX) 0.4 MG CAPS capsule Take 1 capsule (0.4 mg total) by mouth daily after supper.   No facility-administered encounter medications on file as of 03/08/2021.     SIGNIFICANT DIAGNOSTIC EXAMS   PREVIOUS  05-27-20: MRI of brain:  No acute abnormality Motion degraded study Atrophy with mild chronic microvascular ischemic change in the white matter.  05-27-20: MRI of lumbar spine:  1. Image quality degraded by moderate motion. 2. Moderate levoscoliosis lumbar spine. Multilevel degenerative changes throughout the lumbar spine with spinal and foraminal stenosis as described above. 3. Moderate spinal stenosis L2-3 with moderate subarticular and foraminal stenosis right greater than left 4. 7 mm anterolisthesis L5-S1 with possible pars defect on the left. Marked left foraminal encroachment with impingement left L5 nerveroot. 5. These results will be called to the ordering clinician or representative by the Radiologist Assistant, and communication documented in the PACS or [employer].  TODAY  03-08-21: chest x-ray: No interval change in mild CHF is noted. Follow-up chest x-ray is needed.   LABS REVIEWED PREVIOUS  03-08-20: wbc 18.9 hgb 16.7; hct 51.1; mcv 98.1 plt 143; glucose 100; bun 29; creat 1.28; k= 3.7; na++ 140; ca 9.4; total  bili 2.9; albumin 3.2; tsh 0.492; mag 2.5 03-09-20: blood cultures: no growth 03-10-20: wbc 17.0; hgb 16.4; hct 52.2; mcv 100.4 plt 108; glucose 112; bun 29; creat 1.25; k+ 3.7; an++ 140; ca 8.9  03-13-20: wbc 13.8; hgb 15.1; hct 49.4 mcv 103.1 plt 85; glucose 94; bun 21; creat 0.87; k+ 3.7; na++ 140; ca 8.1  03-14-20: wbc 19.4; hgb 16.7; hct 52.7; mcv 101.3 plt 87; glucose 114; bun 20; creat 0.74; k+ 3.9; na++ 140; ca 8.3; liver normal albumin 2.7 03-17-20: glucose 82; bun 20; creat 0.67; k+ 3.8; na++ 138; ca 8.2 liver normal albumin 2.3 03-18-20: wbc 10.4; hgb 15.9; hct 50.7 mcv 101.4; plt 84; glucose 105; bun 23; creat 0.70; k+ 4.3; na++ 140; ca 9.1 03-24-20: wbc 7.8; hgb 13.9; hct  43.6; mcv 101.4 plt 100; glucose 99; bun 22; creat 0.80; k+ 3.7; na++ 140; ca 8.5  05-27-20: wbc 10.3; hgb 14.1; hct 44.3; mcv 102.1 plt 199; glucose 99 bun 22; creat 0.8; k+ 3.7; na++ 140; ca 9.6 liver normal albumin 3.2 BNP 281; tsh 0.465; mag 2.0; urine culture no growth 05-30-20: wbc 13.2; hgb 13.0; hct 40.5; mcv 102.0 plt 197  07-28-20: vit D 93.39 11-10-20: wbc 7.3; hgb 13.7; hct 44.4; mcv 100.9 plt 156; glucose 75; bun 13; creat 0.73; k+ 3.8; na++ 149; ca 9.2 liver normal albumin 3.1 chol 83; ldl 40; trig 38; hdl 35; tsh 1.020 mag 1.9; vit D 52.71  TODAY  01-24-21: wbc 8.4; hgb 15.4; hct 48.8; mcv 98.6 plt 174; glucose 91; bun 12; creat 0.64; k+ 4.0; na++ 142; ca 9.4 GFR>60  03-07-21: wbc 9.5; hgb 16.3; hct 51.8; mcv 99.2 plt 179; glucose 80; bun 15; creat 0.75; k+ 3.7; na++ 144 ca 9.4 GFR >60  Review of Systems  Constitutional: Positive for malaise/fatigue.  Respiratory: Negative for cough and shortness of breath.   Cardiovascular: Negative for chest pain, palpitations and leg swelling.  Gastrointestinal: Negative for abdominal pain, constipation and heartburn.  Genitourinary: Positive for dysuria and frequency.  Musculoskeletal: Negative for back pain, joint pain and myalgias.  Skin: Negative.   Neurological:  Negative for dizziness.  Psychiatric/Behavioral: The patient is not nervous/anxious.     Physical Exam Constitutional:      General: He is not in acute distress.    Appearance: He is well-developed. He is not diaphoretic.  Neck:     Thyroid: No thyromegaly.  Cardiovascular:     Rate and Rhythm: Normal rate and regular rhythm.     Pulses: Normal pulses.     Heart sounds: Normal heart sounds.  Pulmonary:     Effort: Pulmonary effort is normal. No respiratory distress.     Breath sounds: Normal breath sounds.  Abdominal:     General: Bowel sounds are normal. There is no distension.     Palpations: Abdomen is soft.     Tenderness: There is no abdominal tenderness.  Musculoskeletal:        General: Normal range of motion.     Cervical back: Neck supple.     Right lower leg: No edema.     Left lower leg: No edema.  Lymphadenopathy:     Cervical: No cervical adenopathy.  Skin:    General: Skin is warm and dry.     Comments: Bilateral lower extremities wrapped   Neurological:     Mental Status: He is alert. Mental status is at baseline.  Psychiatric:        Mood and Affect: Mood normal.       ASSESSMENT/ PLAN:  TODAY  1. UTI 2. Weakness and fatigue  Will get ua/c&s.  Will continue to monitor his status.    Synthia Innocent NP [facility]  Contact [phone] Monday through Friday 8am- 5pm  After hours call [phone]

## 2021-03-09 ENCOUNTER — Other Ambulatory Visit (HOSPITAL_COMMUNITY)
Admission: RE | Admit: 2021-03-09 | Discharge: 2021-03-09 | Disposition: A | Discharge status: Home / Self Care | Payer: Medicare Other | Source: Skilled Nursing Facility | Attending: Adult Health | Admitting: Adult Health

## 2021-03-09 DIAGNOSIS — R531 Weakness: Secondary | ICD-10-CM | POA: Insufficient documentation

## 2021-03-09 DIAGNOSIS — R300 Dysuria: Secondary | ICD-10-CM | POA: Insufficient documentation

## 2021-03-09 DIAGNOSIS — M5416 Radiculopathy, lumbar region: Secondary | ICD-10-CM | POA: Diagnosis not present

## 2021-03-09 DIAGNOSIS — M4000 Postural kyphosis, site unspecified: Secondary | ICD-10-CM | POA: Insufficient documentation

## 2021-03-09 DIAGNOSIS — R350 Frequency of micturition: Secondary | ICD-10-CM | POA: Insufficient documentation

## 2021-03-09 DIAGNOSIS — M6281 Muscle weakness (generalized): Secondary | ICD-10-CM | POA: Diagnosis not present

## 2021-03-09 LAB — URINALYSIS, ROUTINE W REFLEX MICROSCOPIC
Bacteria, UA: NONE SEEN
Bilirubin Urine: NEGATIVE
Glucose, UA: NEGATIVE mg/dL
Ketones, ur: NEGATIVE mg/dL
Leukocytes,Ua: NEGATIVE
Nitrite: NEGATIVE
Protein, ur: NEGATIVE mg/dL
RBC / HPF: >50 RBC/hpf — ABNORMAL HIGH (ref 0–5)
Specific Gravity, Urine: 1.018 (ref 1.005–1.030)
pH: 6 (ref 5.0–8.0)

## 2021-03-10 DIAGNOSIS — M6281 Muscle weakness (generalized): Secondary | ICD-10-CM | POA: Diagnosis not present

## 2021-03-10 DIAGNOSIS — M5416 Radiculopathy, lumbar region: Secondary | ICD-10-CM | POA: Diagnosis not present

## 2021-03-11 LAB — URINE CULTURE: Culture: NO GROWTH

## 2021-03-13 ENCOUNTER — Encounter: Payer: Self-pay | Admitting: Adult Health

## 2021-03-13 ENCOUNTER — Non-Acute Institutional Stay (SKILLED_NURSING_FACILITY): Payer: Medicare Other | Admitting: Adult Health

## 2021-03-13 DIAGNOSIS — F321 Major depressive disorder, single episode, moderate: Secondary | ICD-10-CM

## 2021-03-13 NOTE — Progress Notes (Signed)
Location:  [facility] Room Number: 119/P Place of Service:  SNF (31)   CODE STATUS: Full Code  No Known Allergies  Chief Complaint  Patient presents with  . Acute Visit    Depression     HPI:  The nursing staff is concerned about his mood state. He is not going to be able to go home like he was trying to achieve. He complain of feeling sad; insomnia; useless. There are no reports of him being agitated. He is spending more time in his room.   Past Medical History:  Diagnosis Date  . A-fib (HCC)   . Cellulitis   . CHF (congestive heart failure) (HCC)   . Hemorrhoid   . Hypertension   . Prostate disorder     Past Surgical History:  Procedure Laterality Date  . BIOPSY  03/17/2020   Procedure: BIOPSY;  Surgeon: Corbin Ade, MD;  [facility];  Service: Endoscopy;;  gastric rectal  . ESOPHAGEAL DILATION N/A 03/17/2020   Procedure: ESOPHAGEAL DILATION;  Surgeon: Corbin Ade, MD;  [facility];  Service: Endoscopy;  Laterality: N/A;  . ESOPHAGOGASTRODUODENOSCOPY (EGD) WITH PROPOFOL N/A 03/17/2020   Procedure: ESOPHAGOGASTRODUODENOSCOPY (EGD) WITH PROPOFOL;  Surgeon: Corbin Ade, MD;  [facility];  Service: Endoscopy;  Laterality: N/A;  . FLEXIBLE SIGMOIDOSCOPY N/A 03/17/2020   Procedure: FLEXIBLE SIGMOIDOSCOPY WITH PROPOFOL;  Surgeon: Corbin Ade, MD;  [facility];  Service: Endoscopy;  Laterality: N/A;  . HEMORRHOID SURGERY     patient reports several hemorrhoid surgeries in the past    Social History   Socioeconomic History  . Marital status: Legally Separated    Spouse name: Not on file  . Number of children: Not on file  . Years of education: Not on file  . Highest education level: Not on file  Occupational History  . Occupation: retired   Tobacco Use  . Smoking status: Former Games developer  . Smokeless tobacco: Never Used  Vaping Use  . Vaping Use: Never used  Substance and Sexual  Activity  . Alcohol use: Yes    Comment: "very little"  . Drug use: Never  . Sexual activity: Not Currently  Other Topics Concern  . Not on file  Social History Narrative   On his third marriage x 18 years. Has 3 children and several grandchildren. His son lives in [place], [place]  and is primary contact. 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 .  Former smoker.    Long term resident of SNF       Social Determinants of Health   Financial Resource Strain: Not on file  Food Insecurity: Not on file  Transportation Needs: Not on file  Physical Activity: Not on file  Stress: Not on file  Social Connections: Not on file  Intimate Partner Violence: Not on file   Family History  Problem Relation Age of Onset  . Heart disease Mother   . Colon cancer Neg Hx       VITAL SIGNS BP 118/77   Pulse 74   Temp 98 F (36.7 C)   Ht 5\' 5"  (1.651 m)   Wt 171 lb 6.4 oz (77.7 kg)   SpO2 93%   BMI 28.52 kg/m   Outpatient Encounter Medications as of 03/13/2021  Medication Sig  . acetaminophen (TYLENOL) 325 MG tablet Take 2 tablets (650 mg total) by mouth every 6 (six) hours as needed for mild  pain, fever or headache (or Fever >/= 101).  Marland Kitchen albuterol (VENTOLIN HFA) 108 (90 Base) MCG/ACT inhaler Inhale 2 puffs into the lungs every 6 (six) hours as needed for wheezing or shortness of breath.  Marland Kitchen apixaban (ELIQUIS) 5 MG TABS tablet Take 1 tablet (5 mg total) by mouth 2 (two) times daily.  . Ascorbic Acid (VITAMIN C) 1000 MG tablet Take 1,000 mg by mouth daily.  [name] [place]-Castor Oil (VENELEX) OINT Apply 1 application topically in the morning, at noon, and at bedtime.  . carboxymethylcellul-glycerin (OPTIVE) 0.5-0.9 % ophthalmic solution Place 1 drop into both eyes 4 (four) times daily. For dry eyes  . gabapentin (NEURONTIN) 100 MG capsule Take 1 capsule (100 mg total) by mouth at bedtime.  . metoprolol succinate (TOPROL-XL) 25 MG 24 hr tablet Take 0.5 tablets (12.5 mg total) by  mouth daily.  . NON FORMULARY BiPap at setting of 20/16 while sleeping. Twice A Day  . NON FORMULARY Diet: _____ Regular, __x____ NAS, _______Consistent Carbohydrate, _______NPO _____Other  . OXYGEN Inhale 2 L into the lungs continuous.   . pantoprazole (PROTONIX) 40 MG tablet Take 1 tablet (40 mg total) by mouth daily.  . polyethylene glycol (MIRALAX / GLYCOLAX) 17 g packet Take 17 g by mouth daily as needed.   . sodium chloride (OCEAN) 0.65 % SOLN nasal spray Place 2 sprays into both nostrils every 6 (six) hours as needed (For Dry Stuffy Nose).  . tamsulosin (FLOMAX) 0.4 MG CAPS capsule Take 1 capsule (0.4 mg total) by mouth daily after supper.   No facility-administered encounter medications on file as of 03/13/2021.     SIGNIFICANT DIAGNOSTIC EXAMS  PREVIOUS  05-27-20: MRI of brain:  No acute abnormality Motion degraded study Atrophy with mild chronic microvascular ischemic change in the white matter.  05-27-20: MRI of lumbar spine:  1. Image quality degraded by moderate motion. 2. Moderate levoscoliosis lumbar spine. Multilevel degenerative changes throughout the lumbar spine with spinal and foraminal stenosis as described above. 3. Moderate spinal stenosis L2-3 with moderate subarticular and foraminal stenosis right greater than left 4. 7 mm anterolisthesis L5-S1 with possible pars defect on the left. Marked left foraminal encroachment with impingement left L5 nerveroot. 5. These results will be called to the ordering clinician or representative by the Radiologist Assistant, and communication documented in the PACS or [employer].  TODAY  03-08-21: chest x-ray: No interval change in mild CHF is noted. Follow-up chest x-ray is needed.   LABS REVIEWED PREVIOUS  03-08-20: wbc 18.9 hgb 16.7; hct 51.1; mcv 98.1 plt 143; glucose 100; bun 29; creat 1.28; k= 3.7; na++ 140; ca 9.4; total bili 2.9; albumin 3.2; tsh 0.492; mag 2.5 03-09-20: blood cultures: no growth 03-10-20: wbc  17.0; hgb 16.4; hct 52.2; mcv 100.4 plt 108; glucose 112; bun 29; creat 1.25; k+ 3.7; an++ 140; ca 8.9  03-13-20: wbc 13.8; hgb 15.1; hct 49.4 mcv 103.1 plt 85; glucose 94; bun 21; creat 0.87; k+ 3.7; na++ 140; ca 8.1  03-14-20: wbc 19.4; hgb 16.7; hct 52.7; mcv 101.3 plt 87; glucose 114; bun 20; creat 0.74; k+ 3.9; na++ 140; ca 8.3; liver normal albumin 2.7 03-17-20: glucose 82; bun 20; creat 0.67; k+ 3.8; na++ 138; ca 8.2 liver normal albumin 2.3 03-18-20: wbc 10.4; hgb 15.9; hct 50.7 mcv 101.4; plt 84; glucose 105; bun 23; creat 0.70; k+ 4.3; na++ 140; ca 9.1 03-24-20: wbc 7.8; hgb 13.9; hct 43.6; mcv 101.4 plt 100; glucose 99; bun 22; creat 0.80; k+  3.7; na++ 140; ca 8.5  05-27-20: wbc 10.3; hgb 14.1; hct 44.3; mcv 102.1 plt 199; glucose 99 bun 22; creat 0.8; k+ 3.7; na++ 140; ca 9.6 liver normal albumin 3.2 BNP 281; tsh 0.465; mag 2.0; urine culture no growth 05-30-20: wbc 13.2; hgb 13.0; hct 40.5; mcv 102.0 plt 197  07-28-20: vit D 93.39 11-10-20: wbc 7.3; hgb 13.7; hct 44.4; mcv 100.9 plt 156; glucose 75; bun 13; creat 0.73; k+ 3.8; na++ 149; ca 9.2 liver normal albumin 3.1 chol 83; ldl 40; trig 38; hdl 35; tsh 1.020 mag 1.9; vit D 52.71 01-24-21: wbc 8.4; hgb 15.4; hct 48.8; mcv 98.6 plt 174; glucose 91; bun 12; creat 0.64; k+ 4.0; na++ 142; ca 9.4 GFR>60  03-07-21: wbc 9.5; hgb 16.3; hct 51.8; mcv 99.2 plt 179; glucose 80; bun 15; creat 0.75; k+ 3.7; na++ 144 ca 9.4 GFR >60  NO NEW LABS.   Review of Systems  Constitutional: Positive for malaise/fatigue.  Respiratory: Negative for cough and shortness of breath.   Cardiovascular: Negative for chest pain, palpitations and leg swelling.  Gastrointestinal: Negative for abdominal pain, constipation and heartburn.  Musculoskeletal: Negative for back pain, joint pain and myalgias.  Skin: Negative.   Neurological: Negative for dizziness.  Psychiatric/Behavioral: Positive for depression. Negative for suicidal ideas. The patient is not nervous/anxious and  does not have insomnia.    Physical Exam Constitutional:      General: He is not in acute distress.    Appearance: He is well-developed. He is not diaphoretic.  Neck:     Thyroid: No thyromegaly.  Cardiovascular:     Rate and Rhythm: Normal rate and regular rhythm.     Pulses: Normal pulses.     Heart sounds: Normal heart sounds.  Pulmonary:     Effort: Pulmonary effort is normal. No respiratory distress.     Breath sounds: Normal breath sounds.  Abdominal:     General: Bowel sounds are normal. There is no distension.     Palpations: Abdomen is soft.     Tenderness: There is no abdominal tenderness.  Musculoskeletal:        General: Normal range of motion.     Cervical back: Neck supple.     Right lower leg: No edema.     Left lower leg: No edema.  Lymphadenopathy:     Cervical: No cervical adenopathy.  Skin:    General: Skin is warm and dry.     Comments: Bilateral lower extremities wrapped   Neurological:     Mental Status: He is alert. Mental status is at baseline.  Psychiatric:        Mood and Affect: Mood normal.       ASSESSMENT/ PLAN:  TODAY  1. Depression major single episode moderate:  Worse: will begin zoloft 50 mg daily will monitor his status.    Synthia Innocent NP [facility]  Contact [phone] Monday through Friday 8am- 5pm  After hours call [phone]

## 2021-03-14 DIAGNOSIS — M6281 Muscle weakness (generalized): Secondary | ICD-10-CM | POA: Diagnosis not present

## 2021-03-14 DIAGNOSIS — M5416 Radiculopathy, lumbar region: Secondary | ICD-10-CM | POA: Diagnosis not present

## 2021-03-15 ENCOUNTER — Encounter: Payer: Self-pay | Admitting: Adult Health

## 2021-03-15 ENCOUNTER — Non-Acute Institutional Stay (SKILLED_NURSING_FACILITY): Payer: Medicare Other | Admitting: Adult Health

## 2021-03-15 DIAGNOSIS — M5416 Radiculopathy, lumbar region: Secondary | ICD-10-CM | POA: Diagnosis not present

## 2021-03-15 DIAGNOSIS — F321 Major depressive disorder, single episode, moderate: Secondary | ICD-10-CM | POA: Diagnosis not present

## 2021-03-15 DIAGNOSIS — M6281 Muscle weakness (generalized): Secondary | ICD-10-CM | POA: Diagnosis not present

## 2021-03-15 DIAGNOSIS — I482 Chronic atrial fibrillation, unspecified: Secondary | ICD-10-CM

## 2021-03-15 DIAGNOSIS — I700 Atherosclerosis of aorta: Secondary | ICD-10-CM

## 2021-03-15 NOTE — Progress Notes (Signed)
Location:  [facility] Room Number: 222 Place of Service:  SNF (31)    CODE STATUS: full code   No Known Allergies  Chief Complaint  Patient presents with  . Acute Visit    Care plan meeting     HPI:  We have come together for his care plan meetings. BIMS 7/15 MOOD 9/30 he was started on zoloft on 03-14-21. Marland Kitchen He has a decreased appetite. He continues to BiPAP with 02 feed. There have been no falls. Continues with profore wrap to both lower extremities. He requires limited to extensive assist with his adls. He does feed himself. He is nonambulatory; is frequently incontinent of bladder and bowel.  Weight 171 pounds has lost 13 pounds this quarter and has added supplements.   Significant decline in ADLs max assist with toileting and increased assist with adls. Is being seen by pt/ot   . He continues to be followed for his chronic illnesses including: Atrial fibrillation chronic Aortic atherosclerosis  Major depression single episode moderate  Past Medical History:  Diagnosis Date  . A-fib (HCC)   . Cellulitis   . CHF (congestive heart failure) (HCC)   . Hemorrhoid   . Hypertension   . Prostate disorder     Past Surgical History:  Procedure Laterality Date  . BIOPSY  03/17/2020   Procedure: BIOPSY;  Surgeon: Jesus Ade, MD;  [facility];  Service: Endoscopy;;  gastric rectal  . ESOPHAGEAL DILATION N/A 03/17/2020   Procedure: ESOPHAGEAL DILATION;  Surgeon: Jesus Ade, MD;  [facility];  Service: Endoscopy;  Laterality: N/A;  . ESOPHAGOGASTRODUODENOSCOPY (EGD) WITH PROPOFOL N/A 03/17/2020   Procedure: ESOPHAGOGASTRODUODENOSCOPY (EGD) WITH PROPOFOL;  Surgeon: Jesus Ade, MD;  [facility];  Service: Endoscopy;  Laterality: N/A;  . FLEXIBLE SIGMOIDOSCOPY N/A 03/17/2020   Procedure: FLEXIBLE SIGMOIDOSCOPY WITH PROPOFOL;  Surgeon: Jesus Ade, MD;  [facility];  Service: Endoscopy;  Laterality: N/A;   . HEMORRHOID SURGERY     patient reports several hemorrhoid surgeries in the past    Social History   Socioeconomic History  . Marital status: Legally Separated    Spouse name: Not on file  . Number of children: Not on file  . Years of education: Not on file  . Highest education level: Not on file  Occupational History  . Occupation: retired   Tobacco Use  . Smoking status: Former Games developer  . Smokeless tobacco: Never Used  Vaping Use  . Vaping Use: Never used  Substance and Sexual Activity  . Alcohol use: Yes    Comment: "very little"  . Drug use: Never  . Sexual activity: Not Currently  Other Topics Concern  . Not on file  Social History Narrative   On his third marriage x 18 years. Has 3 children and several grandchildren. His son lives in [place], [place]  and is primary contact. 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 .  Former smoker.    Long term resident of SNF       Social Determinants of Health   Financial Resource Strain: Not on file  Food Insecurity: Not on file  Transportation Needs: Not on file  Physical Activity: Not on file  Stress: Not on file  Social Connections: Not on file  Intimate Partner Violence: Not on file   Family History  Problem Relation Age of Onset  . Heart disease Mother   . Colon  cancer Neg Hx       VITAL SIGNS BP 118/77   Pulse 73   Temp (!) 97.1 F (36.2 C)   Resp 18   Ht 5\' 5"  (1.651 m)   Wt 171 lb 6.4 oz (77.7 kg)   SpO2 92%   BMI 28.52 kg/m   Outpatient Encounter Medications as of 03/15/2021  Medication Sig  . acetaminophen (TYLENOL) 325 MG tablet Take 2 tablets (650 mg total) by mouth every 6 (six) hours as needed for mild pain, fever or headache (or Fever >/= 101).  Marland Kitchen. albuterol (VENTOLIN HFA) 108 (90 Base) MCG/ACT inhaler Inhale 2 puffs into the lungs every 6 (six) hours as needed for wheezing or shortness of breath.  Marland Kitchen. apixaban (ELIQUIS) 5 MG TABS tablet Take 1 tablet (5 mg total) by mouth 2 (two)  times daily.  . Ascorbic Acid (VITAMIN C) 1000 MG tablet Take 1,000 mg by mouth daily.  Jesus Fowler. Balsam [place]-Castor Oil (VENELEX) OINT Apply 1 application topically in the morning, at noon, and at bedtime.  . carboxymethylcellul-glycerin (OPTIVE) 0.5-0.9 % ophthalmic solution Place 1 drop into both eyes 4 (four) times daily. For dry eyes  . gabapentin (NEURONTIN) 100 MG capsule Take 1 capsule (100 mg total) by mouth at bedtime.  . metoprolol succinate (TOPROL-XL) 25 MG 24 hr tablet Take 0.5 tablets (12.5 mg total) by mouth daily.  . NON FORMULARY BiPap at setting of 20/16 while sleeping. Twice A Day  . NON FORMULARY Diet: _____ Regular, __x____ NAS, _______Consistent Carbohydrate, _______NPO _____Other  . OXYGEN Inhale 2 L into the lungs continuous.   . pantoprazole (PROTONIX) 40 MG tablet Take 1 tablet (40 mg total) by mouth daily.  . polyethylene glycol (MIRALAX / GLYCOLAX) 17 g packet Take 17 g by mouth daily as needed.   . sodium chloride (OCEAN) 0.65 % SOLN nasal spray Place 2 sprays into both nostrils every 6 (six) hours as needed (For Dry Stuffy Nose).  . tamsulosin (FLOMAX) 0.4 MG CAPS capsule Take 1 capsule (0.4 mg total) by mouth daily after supper.   No facility-administered encounter medications on file as of 03/15/2021.     SIGNIFICANT DIAGNOSTIC EXAMS   PREVIOUS  05-27-20: MRI of brain:  No acute abnormality Motion degraded study Atrophy with mild chronic microvascular ischemic change in the white matter.  05-27-20: MRI of lumbar spine:  1. Image quality degraded by moderate motion. 2. Moderate levoscoliosis lumbar spine. Multilevel degenerative changes throughout the lumbar spine with spinal and foraminal stenosis as described above. 3. Moderate spinal stenosis L2-3 with moderate subarticular and foraminal stenosis right greater than left 4. 7 mm anterolisthesis L5-S1 with possible pars defect on the left. Marked left foraminal encroachment with impingement left L5  nerveroot. 5. These results will be called to the ordering clinician or representative by the Radiologist Assistant, and communication documented in the PACS or [employer].  03-08-21: chest x-ray: No interval change in mild CHF is noted. Follow-up chest x-ray is needed.  NO NEW LABS.    LABS REVIEWED PREVIOUS  03-08-20: wbc 18.9 hgb 16.7; hct 51.1; mcv 98.1 plt 143; glucose 100; bun 29; creat 1.28; k= 3.7; na++ 140; ca 9.4; total bili 2.9; albumin 3.2; tsh 0.492; mag 2.5 03-09-20: blood cultures: no growth 03-10-20: wbc 17.0; hgb 16.4; hct 52.2; mcv 100.4 plt 108; glucose 112; bun 29; creat 1.25; k+ 3.7; an++ 140; ca 8.9  03-13-20: wbc 13.8; hgb 15.1; hct 49.4 mcv 103.1 plt 85; glucose 94; bun 21; creat 0.87;  k+ 3.7; na++ 140; ca 8.1  03-14-20: wbc 19.4; hgb 16.7; hct 52.7; mcv 101.3 plt 87; glucose 114; bun 20; creat 0.74; k+ 3.9; na++ 140; ca 8.3; liver normal albumin 2.7 03-17-20: glucose 82; bun 20; creat 0.67; k+ 3.8; na++ 138; ca 8.2 liver normal albumin 2.3 03-18-20: wbc 10.4; hgb 15.9; hct 50.7 mcv 101.4; plt 84; glucose 105; bun 23; creat 0.70; k+ 4.3; na++ 140; ca 9.1 03-24-20: wbc 7.8; hgb 13.9; hct 43.6; mcv 101.4 plt 100; glucose 99; bun 22; creat 0.80; k+ 3.7; na++ 140; ca 8.5  05-27-20: wbc 10.3; hgb 14.1; hct 44.3; mcv 102.1 plt 199; glucose 99 bun 22; creat 0.8; k+ 3.7; na++ 140; ca 9.6 liver normal albumin 3.2 BNP 281; tsh 0.465; mag 2.0; urine culture no growth 05-30-20: wbc 13.2; hgb 13.0; hct 40.5; mcv 102.0 plt 197  07-28-20: vit D 93.39 11-10-20: wbc 7.3; hgb 13.7; hct 44.4; mcv 100.9 plt 156; glucose 75; bun 13; creat 0.73; k+ 3.8; na++ 149; ca 9.2 liver normal albumin 3.1 chol 83; ldl 40; trig 38; hdl 35; tsh 1.020 mag 1.9; vit D 52.71 01-24-21: wbc 8.4; hgb 15.4; hct 48.8; mcv 98.6 plt 174; glucose 91; bun 12; creat 0.64; k+ 4.0; na++ 142; ca 9.4 GFR>60  03-07-21: wbc 9.5; hgb 16.3; hct 51.8; mcv 99.2 plt 179; glucose 80; bun 15; creat 0.75; k+ 3.7; na++ 144 ca 9.4 GFR  >60  NO NEW LABS.   Review of Systems  Constitutional: Positive for malaise/fatigue.  Respiratory: Negative for cough and shortness of breath.   Cardiovascular: Negative for chest pain, palpitations and leg swelling.  Gastrointestinal: Negative for abdominal pain, constipation and heartburn.  Musculoskeletal: Negative for back pain, joint pain and myalgias.  Skin:       Ulcerations on legs   Neurological: Negative for dizziness.  Psychiatric/Behavioral: The patient is not nervous/anxious.     Physical Exam Constitutional:      General: He is not in acute distress.    Appearance: He is well-developed. He is not diaphoretic.  Neck:     Thyroid: No thyromegaly.  Cardiovascular:     Rate and Rhythm: Normal rate and regular rhythm.     Pulses: Normal pulses.     Heart sounds: Normal heart sounds.  Pulmonary:     Effort: Pulmonary effort is normal. No respiratory distress.     Breath sounds: Normal breath sounds.  Abdominal:     General: Bowel sounds are normal. There is no distension.     Palpations: Abdomen is soft.     Tenderness: There is no abdominal tenderness.  Musculoskeletal:        General: Normal range of motion.     Cervical back: Neck supple.     Right lower leg: No edema.     Left lower leg: No edema.  Lymphadenopathy:     Cervical: No cervical adenopathy.  Skin:    General: Skin is warm and dry.     Comments: Bilateral legs wrapped   Neurological:     Mental Status: He is alert. Mental status is at baseline.  Psychiatric:        Mood and Affect: Mood normal.      ASSESSMENT/ PLAN:  TODAY  1. Atrial fibrillation chronic 2. Aortic atherosclerosis 3. Major depression single episode moderate  Will continue current medications Will continue current plan of care Will continue to monitor his status. Will continue therapy as directed.   Time spent with patient: 35 minutes >50%  of time: coordination of care and counseling. To include wound care; therapy  goals and prognosis.    Synthia Innocent NP [facility]  Contact [phone] Monday through Friday 8am- 5pm  After hours call [phone]

## 2021-03-16 DIAGNOSIS — M5416 Radiculopathy, lumbar region: Secondary | ICD-10-CM | POA: Diagnosis not present

## 2021-03-16 DIAGNOSIS — M6281 Muscle weakness (generalized): Secondary | ICD-10-CM | POA: Diagnosis not present

## 2021-03-17 DIAGNOSIS — M5416 Radiculopathy, lumbar region: Secondary | ICD-10-CM | POA: Diagnosis not present

## 2021-03-17 DIAGNOSIS — M6281 Muscle weakness (generalized): Secondary | ICD-10-CM | POA: Diagnosis not present

## 2021-03-20 DIAGNOSIS — M6281 Muscle weakness (generalized): Secondary | ICD-10-CM | POA: Diagnosis not present

## 2021-03-20 DIAGNOSIS — M5416 Radiculopathy, lumbar region: Secondary | ICD-10-CM | POA: Diagnosis not present

## 2021-03-21 DIAGNOSIS — M6281 Muscle weakness (generalized): Secondary | ICD-10-CM | POA: Diagnosis not present

## 2021-03-21 DIAGNOSIS — M5416 Radiculopathy, lumbar region: Secondary | ICD-10-CM | POA: Diagnosis not present

## 2021-03-22 DIAGNOSIS — M5416 Radiculopathy, lumbar region: Secondary | ICD-10-CM | POA: Diagnosis not present

## 2021-03-22 DIAGNOSIS — M6281 Muscle weakness (generalized): Secondary | ICD-10-CM | POA: Diagnosis not present

## 2021-03-23 DIAGNOSIS — M6281 Muscle weakness (generalized): Secondary | ICD-10-CM | POA: Diagnosis not present

## 2021-03-23 DIAGNOSIS — M5416 Radiculopathy, lumbar region: Secondary | ICD-10-CM | POA: Diagnosis not present

## 2021-03-24 DIAGNOSIS — M5416 Radiculopathy, lumbar region: Secondary | ICD-10-CM | POA: Diagnosis not present

## 2021-03-24 DIAGNOSIS — M6281 Muscle weakness (generalized): Secondary | ICD-10-CM | POA: Diagnosis not present

## 2021-03-27 ENCOUNTER — Ambulatory Visit: Payer: Medicare Other | Admitting: Surgery

## 2021-03-27 ENCOUNTER — Encounter: Payer: Self-pay | Admitting: Surgery

## 2021-03-27 VITALS — BP 136/83 | HR 64 | Temp 98.5°F | Resp 20 | Ht 65.0 in | Wt 171.0 lb

## 2021-03-27 DIAGNOSIS — I87313 Chronic venous hypertension (idiopathic) with ulcer of bilateral lower extremity: Secondary | ICD-10-CM

## 2021-03-27 DIAGNOSIS — M6281 Muscle weakness (generalized): Secondary | ICD-10-CM | POA: Diagnosis not present

## 2021-03-27 DIAGNOSIS — L97919 Non-pressure chronic ulcer of unspecified part of right lower leg with unspecified severity: Secondary | ICD-10-CM | POA: Diagnosis not present

## 2021-03-27 DIAGNOSIS — M5416 Radiculopathy, lumbar region: Secondary | ICD-10-CM | POA: Diagnosis not present

## 2021-03-27 DIAGNOSIS — L97929 Non-pressure chronic ulcer of unspecified part of left lower leg with unspecified severity: Secondary | ICD-10-CM | POA: Diagnosis not present

## 2021-03-27 NOTE — Progress Notes (Signed)
[facility]  Patient name: Jesus Fowler MRN: [id number] DOB: 11-Feb-1933 Sex: male   REASON FOR VISIT:    Follow up  [place] ILLNESS:    Jesus Fowler is a 87 y.o. male that I met in October 2021 for bilateral lower extremity wounds.  He had arteries that were noncompressible on arterial duplex however the waveforms were triphasic at the ankle.  The right toe pressure was 88 and the left toe pressure was 78.  I felt that his wounds were more likely the result of chronic edema.  He was placed in [employer].  We last saw him on 10/13/2020.  At that time there was no ulceration just chronically dry thickened scaly skin.  He has been getting dressing changes at the [facility]:   Past Medical History:  Diagnosis Date  . A-fib ([place])   . Cellulitis   . CHF (congestive heart failure) ([place])   . Hemorrhoid   . Hypertension   . Prostate disorder      FAMILY HISTORY:   Family History  Problem Relation Age of Onset  . Heart disease Mother   . Colon cancer Neg Hx     SOCIAL HISTORY:   Social History   Tobacco Use  . Smoking status: Former Research scientist (life sciences)  . Smokeless tobacco: Never Used  Substance Use Topics  . Alcohol use: Yes    Comment: "very little"     ALLERGIES:   No Known Allergies   CURRENT MEDICATIONS:   No current outpatient medications on file.   No current facility-administered medications for this visit.    REVIEW OF SYSTEMS:   '[X]'  denotes positive finding, '[ ]'  denotes negative finding Cardiac  Comments:  Chest pain or chest pressure:    Shortness of breath upon exertion:    Short of breath when lying flat:    Irregular heart rhythm:        Vascular    Pain in calf, thigh, or hip brought on by ambulation:    Pain in feet at night that wakes you up from your sleep:     Blood clot in your veins:    Leg swelling:  x       Pulmonary    Oxygen at  home:    Productive cough:     Wheezing:         Neurologic    Sudden weakness in arms or legs:     Sudden numbness in arms or legs:     Sudden onset of difficulty speaking or slurred speech:    Temporary loss of vision in one eye:     Problems with dizziness:         Gastrointestinal    Blood in stool:     Vomited blood:         Genitourinary    Burning when urinating:     Blood in urine:        Psychiatric    Major depression:         Hematologic    Bleeding problems:    Problems with blood clotting too easily:        Skin    Rashes or ulcers: x       Constitutional    Fever or chills:      PHYSICAL EXAM:   There were no vitals filed for this visit.  GENERAL: The patient is a well-nourished male, in no acute distress.  The vital signs are documented above. CARDIAC: There is a regular rate and rhythm.  VASCULAR: Bilateral lower extremity edema with desquamation and cracking of the skin PULMONARY: Non-labored respirations MUSCULOSKELETAL: There are no major deformities or cyanosis. NEUROLOGIC: No focal weakness or paresthesias are detected. SKIN: Small bilateral open wounds on the back of the right leg and on the anterior lateral left.  See photo below PSYCHIATRIC: The patient has a normal affect.    STUDIES:   None  MEDICAL ISSUES:   Bilateral lower extremity wounds: Based off ABIs and vascular testing, he should have adequate blood supply to his legs.  My biggest concern is the scaling, peeling skin bilaterally, and now with bilateral ulcers.  I have recommended doing his dressing changes 3 times a week, preferably with a Unna boot or an equivalent.  He will need to have his legs washed with soap and water around his dressing changes.  I am also making a referral to the wound center to see if there are additional topical agents that can be utilized to help with his skin condition.  I have not scheduled him a follow-up appointment but he knows to contact me  should surgical intervention be required    Annamarie Major, IV, MD, FACS [facility] [phone] Pager [phone]

## 2021-03-29 DIAGNOSIS — M5416 Radiculopathy, lumbar region: Secondary | ICD-10-CM | POA: Diagnosis not present

## 2021-03-29 DIAGNOSIS — M6281 Muscle weakness (generalized): Secondary | ICD-10-CM | POA: Diagnosis not present

## 2021-03-30 DIAGNOSIS — M6281 Muscle weakness (generalized): Secondary | ICD-10-CM | POA: Diagnosis not present

## 2021-03-30 DIAGNOSIS — M5416 Radiculopathy, lumbar region: Secondary | ICD-10-CM | POA: Diagnosis not present

## 2021-03-31 DIAGNOSIS — M5416 Radiculopathy, lumbar region: Secondary | ICD-10-CM | POA: Diagnosis not present

## 2021-03-31 DIAGNOSIS — M6281 Muscle weakness (generalized): Secondary | ICD-10-CM | POA: Diagnosis not present

## 2021-04-03 ENCOUNTER — Non-Acute Institutional Stay (SKILLED_NURSING_FACILITY): Payer: Medicare Other | Admitting: Adult Health

## 2021-04-03 ENCOUNTER — Encounter: Payer: Self-pay | Admitting: Adult Health

## 2021-04-03 DIAGNOSIS — N400 Enlarged prostate without lower urinary tract symptoms: Secondary | ICD-10-CM

## 2021-04-03 DIAGNOSIS — M25551 Pain in right hip: Secondary | ICD-10-CM | POA: Diagnosis not present

## 2021-04-03 DIAGNOSIS — E785 Hyperlipidemia, unspecified: Secondary | ICD-10-CM | POA: Diagnosis not present

## 2021-04-03 DIAGNOSIS — W19XXXA Unspecified fall, initial encounter: Secondary | ICD-10-CM | POA: Diagnosis not present

## 2021-04-03 DIAGNOSIS — M5416 Radiculopathy, lumbar region: Secondary | ICD-10-CM

## 2021-04-03 DIAGNOSIS — M6281 Muscle weakness (generalized): Secondary | ICD-10-CM | POA: Diagnosis not present

## 2021-04-03 NOTE — Progress Notes (Signed)
Location:  [facility] Room Number: 119/P Place of Service:  SNF (31)   CODE STATUS: Full Code  No Known Allergies  Chief Complaint  Patient presents with  . Medical Management of Chronic Issues            BPH without urinary obstruction:  Dyslipidemia:   Lumbar radiculopathy L5/S1    HPI:  He is a 87 year old long term resident of this facility being seen for the management of his chronic illnesses: BPH without urinary obstruction:  Dyslipidemia:   Lumbar radiculopathy L5/S1. There are no reports of uncontrolled pain; no changes in his appetite; no reports of anxiety or depressive thoughts.   Past Medical History:  Diagnosis Date  . A-fib (HCC)   . Cellulitis   . CHF (congestive heart failure) (HCC)   . Hemorrhoid   . Hypertension   . Prostate disorder     Past Surgical History:  Procedure Laterality Date  . BIOPSY  03/17/2020   Procedure: BIOPSY;  Surgeon: Corbin Ade, MD;  [facility]: [facility];  Service: Endoscopy;;  gastric rectal  . ESOPHAGEAL DILATION N/A 03/17/2020   Procedure: ESOPHAGEAL DILATION;  Surgeon: Corbin Ade, MD;  [facility]: [facility];  Service: Endoscopy;  Laterality: N/A;  . ESOPHAGOGASTRODUODENOSCOPY (EGD) WITH PROPOFOL N/A 03/17/2020   Procedure: ESOPHAGOGASTRODUODENOSCOPY (EGD) WITH PROPOFOL;  Surgeon: Corbin Ade, MD;  [facility]: [facility];  Service: Endoscopy;  Laterality: N/A;  . FLEXIBLE SIGMOIDOSCOPY N/A 03/17/2020   Procedure: FLEXIBLE SIGMOIDOSCOPY WITH PROPOFOL;  Surgeon: Corbin Ade, MD;  [facility]: [facility];  Service: Endoscopy;  Laterality: N/A;  . HEMORRHOID SURGERY     patient reports several hemorrhoid surgeries in the past    Social History   Socioeconomic History  . Marital status: Legally Separated    Spouse name: Not on file  . Number of children: Not on file  . Years of education: Not on file  . Highest education level: Not on file  Occupational History  . Occupation:  retired   Tobacco Use  . Smoking status: Former Games developer  . Smokeless tobacco: Never Used  Vaping Use  . Vaping Use: Never used  Substance and Sexual Activity  . Alcohol use: Yes    Comment: "very little"  . Drug use: Never  . Sexual activity: Not Currently  Other Topics Concern  . Not on file  Social History Narrative   On his third marriage x 18 years. Has 3 children and several grandchildren. His son lives in [place], [place]  and is primary contact. 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 .  Former smoker.    Long term resident of SNF       Social Determinants of Health   Financial Resource Strain: Not on file  Food Insecurity: Not on file  Transportation Needs: Not on file  Physical Activity: Not on file  Stress: Not on file  Social Connections: Not on file  Intimate Partner Violence: Not on file   Family History  Problem Relation Age of Onset  . Heart disease Mother   . Colon cancer Neg Hx       VITAL SIGNS BP 120/65   Pulse 75   Temp (!) 97.1 F (36.2 C)   Resp 16   Ht 5\' 5"  (1.651 m)   Wt 171 lb 6.4 oz (77.7 kg)   SpO2 94%   BMI 28.52 kg/m   Outpatient Encounter Medications as  of 04/03/2021  Medication Sig  . acetaminophen (TYLENOL) 325 MG tablet Take 2 tablets (650 mg total) by mouth every 6 (six) hours as needed for mild pain, fever or headache (or Fever >/= 101).  Marland Kitchen albuterol (VENTOLIN HFA) 108 (90 Base) MCG/ACT inhaler Inhale 2 puffs into the lungs every 6 (six) hours as needed for wheezing or shortness of breath.  Marland Kitchen apixaban (ELIQUIS) 5 MG TABS tablet Take 1 tablet (5 mg total) by mouth 2 (two) times daily.  . Ascorbic Acid (VITAMIN C) 1000 MG tablet Take 1,000 mg by mouth daily.  [name] [place]-Castor Oil (VENELEX) OINT Apply 1 application topically in the morning, at noon, and at bedtime.  . carboxymethylcellul-glycerin (OPTIVE) 0.5-0.9 % ophthalmic solution Place 1 drop into both eyes 4 (four) times daily. For dry eyes  . gabapentin  (NEURONTIN) 100 MG capsule Take 1 capsule (100 mg total) by mouth at bedtime.  . metoprolol succinate (TOPROL-XL) 25 MG 24 hr tablet Take 0.5 tablets (12.5 mg total) by mouth daily.  . NON FORMULARY BiPap at setting of 20/16 while sleeping. Twice A Day  . NON FORMULARY Diet: _____ Regular, __x____ NAS, _______Consistent Carbohydrate, _______NPO _____Other  . OXYGEN Inhale 2 L into the lungs continuous.   . pantoprazole (PROTONIX) 40 MG tablet Take 1 tablet (40 mg total) by mouth daily.  . polyethylene glycol (MIRALAX / GLYCOLAX) 17 g packet Take 17 g by mouth daily.  . sertraline (ZOLOFT) 50 MG tablet Take 50 mg by mouth daily.  . Skin Protectants, Misc. (EUCERIN) cream Apply 1 application topically. Once a week on Wednesday. Special Instructions: Bilateral lower extremities- Clean legs with soap and water. Pat dry. Apply Eucerin cream to bilateral legs, apply kerlix, then apply coban wrap from toes to knees  . sodium chloride (OCEAN) 0.65 % SOLN nasal spray Place 2 sprays into both nostrils every 6 (six) hours as needed (For Dry Stuffy Nose).  . tamsulosin (FLOMAX) 0.4 MG CAPS capsule Take 1 capsule (0.4 mg total) by mouth daily after supper.   No facility-administered encounter medications on file as of 04/03/2021.     SIGNIFICANT DIAGNOSTIC EXAMS   PREVIOUS  05-27-20: chest x-ray:  1. No acute abnormality. 2. Stable cardiomegaly and mild chronic bronchitic changes. 3. Stable elevated right hemidiaphragm  05-27-20: ct of head: Mild atrophy. No acute abnormality   05-27-20: MRI of brain:  No acute abnormality Motion degraded study Atrophy with mild chronic microvascular ischemic change in the white matter.  05-27-20: MRI of lumbar spine:  1. Image quality degraded by moderate motion. 2. Moderate levoscoliosis lumbar spine. Multilevel degenerative changes throughout the lumbar spine with spinal and foraminal stenosis as described above. 3. Moderate spinal stenosis L2-3 with  moderate subarticular and foraminal stenosis right greater than left 4. 7 mm anterolisthesis L5-S1 with possible pars defect on the left. Marked left foraminal encroachment with impingement left L5 nerveroot. 5. These results will be called to the ordering clinician or representative by the Radiologist Assistant, and communication documented in the PACS or [employer].  NO NEW EXAMS.    LABS REVIEWED PREVIOUS  05-27-20: wbc 10.3; hgb 14.1; hct 44.3; mcv 102.1 plt 199; glucose 99 bun 22; creat 0.8; k+ 3.7; na++ 140; ca 9.6 liver normal albumin 3.2 BNP 281; tsh 0.465; mag 2.0; urine culture no growth 05-30-20: wbc 13.2; hgb 13.0; hct 40.5; mcv 102.0 plt 197  07-28-20: vit D 93.39 11-10-20: wbc 7.3; hgb 13.7; hct 44.4; mcv 100.9 plt 156; glucose 75; bun  13; creat 0.73; k+ 3.8; na++ 149; ca 9.2 liver normal albumin 3.1 chol 83; ldl 40; trig 38; hdl 35; tsh 1.020 mag 1.9; vit D 52.71 01-24-21: wbc 8.4; hgb 15.4; hct 48.8; mcv 98.6 plt 174; glucose 91; bun 12; creat 0.64; k+ 4.0; na++ 142; ca 9.4 GFR>60   TODAY  03-07-21: wbc 9.5; hgb 16.3; hct 51.8; mcv 99.2 plt 179 ;glucose 80; bun 15; creat 0.75; k+ 3.7; na++ 144; ca 9.4; GFR>60 03-09-21: urine culture no growth  Review of Systems  Constitutional: Negative for malaise/fatigue.  Respiratory: Negative for cough and shortness of breath.   Cardiovascular: Negative for chest pain, palpitations and leg swelling.  Gastrointestinal: Negative for abdominal pain, constipation and heartburn.  Musculoskeletal: Negative for back pain, joint pain and myalgias.  Skin: Negative.   Neurological: Negative for dizziness.  Psychiatric/Behavioral: The patient is not nervous/anxious.       Physical Exam Constitutional:      General: He is not in acute distress.    Appearance: He is well-developed. He is obese. He is not diaphoretic.  Neck:     Thyroid: No thyromegaly.  Cardiovascular:     Rate and Rhythm: Normal rate and regular rhythm.     Pulses:  Normal pulses.     Heart sounds: Normal heart sounds.  Pulmonary:     Effort: Pulmonary effort is normal. No respiratory distress.     Breath sounds: Normal breath sounds.  Abdominal:     General: Bowel sounds are normal. There is no distension.     Palpations: Abdomen is soft.     Tenderness: There is no abdominal tenderness.  Musculoskeletal:        General: Normal range of motion.     Cervical back: Neck supple.     Right lower leg: No edema.     Left lower leg: No edema.  Lymphadenopathy:     Cervical: No cervical adenopathy.  Skin:    General: Skin is warm and dry.     Comments: Bilateral lower extremities wrapped   Neurological:     Mental Status: He is alert and oriented to person, place, and time.  Psychiatric:        Mood and Affect: Mood normal.       ASSESSMENT/ PLAN:  TODAY  1. BPH without urinary obstruction: is stable will continue flomax 0.4 mg daily   2. Dyslipidemia: is stable LDL 40; trig 38; is off statin and fish oil will monitor  3. Lumbar radiculopathy L5/S1; is stable will continue gabapentin 100 mg nightly     PREVIOUS  4. Atrial fibrillation chronic: is stable will continue toprol xl 12.5 mg daily for rate control and eliquis 5 mg twice daily   5. Acute on chronic heart failure with preserved ejection fraction diastolic dysfunction is compensated will continue toprol xl 12.5 mg daily  6. OSA is on BIPAP is stable will continue bipap nightly   7. GERD without esophagitis: is stable will continue protonix 40 mg daily   8. Chronic constipation: is stable will continue miralax twice daily and senna s 2 tabs daily   9. Chronic venous insuffiencey: is without change; bilateral legs in wraps  10. Protein calorie malnutrition; severe; albumin 3.1 will continue supplements as directed.     Synthia Innocent NP [facility]  Contact [phone] Monday through Friday 8am- 5pm  After hours call [phone]

## 2021-04-05 ENCOUNTER — Other Ambulatory Visit (HOSPITAL_COMMUNITY)
Admission: RE | Admit: 2021-04-05 | Discharge: 2021-04-05 | Disposition: A | Discharge status: Home / Self Care | Payer: Medicare Other | Source: Skilled Nursing Facility | Attending: Adult Health | Admitting: Adult Health

## 2021-04-05 DIAGNOSIS — Z7901 Long term (current) use of anticoagulants: Secondary | ICD-10-CM | POA: Diagnosis not present

## 2021-04-05 DIAGNOSIS — M5416 Radiculopathy, lumbar region: Secondary | ICD-10-CM | POA: Diagnosis not present

## 2021-04-05 DIAGNOSIS — M6281 Muscle weakness (generalized): Secondary | ICD-10-CM | POA: Diagnosis not present

## 2021-04-05 LAB — HEMOGLOBIN AND HEMATOCRIT, BLOOD
HCT: 40.2 % (ref 39.0–52.0)
Hemoglobin: 12.4 g/dL — ABNORMAL LOW (ref 13.0–17.0)

## 2021-04-06 DIAGNOSIS — M5416 Radiculopathy, lumbar region: Secondary | ICD-10-CM | POA: Diagnosis not present

## 2021-04-06 DIAGNOSIS — M6281 Muscle weakness (generalized): Secondary | ICD-10-CM | POA: Diagnosis not present

## 2021-04-07 DIAGNOSIS — M5416 Radiculopathy, lumbar region: Secondary | ICD-10-CM | POA: Diagnosis not present

## 2021-04-07 DIAGNOSIS — M6281 Muscle weakness (generalized): Secondary | ICD-10-CM | POA: Diagnosis not present

## 2021-04-10 DIAGNOSIS — M6281 Muscle weakness (generalized): Secondary | ICD-10-CM | POA: Diagnosis not present

## 2021-04-10 DIAGNOSIS — M5416 Radiculopathy, lumbar region: Secondary | ICD-10-CM | POA: Diagnosis not present

## 2021-04-12 ENCOUNTER — Other Ambulatory Visit (HOSPITAL_COMMUNITY)
Admission: RE | Admit: 2021-04-12 | Discharge: 2021-04-12 | Disposition: A | Discharge status: Home / Self Care | Payer: Medicare Other | Source: Skilled Nursing Facility | Attending: Adult Health | Admitting: Adult Health

## 2021-04-12 ENCOUNTER — Encounter: Payer: Self-pay | Admitting: Adult Health

## 2021-04-12 ENCOUNTER — Non-Acute Institutional Stay (SKILLED_NURSING_FACILITY): Payer: Medicare Other | Admitting: Adult Health

## 2021-04-12 DIAGNOSIS — S7001XA Contusion of right hip, initial encounter: Secondary | ICD-10-CM | POA: Diagnosis not present

## 2021-04-12 DIAGNOSIS — L03115 Cellulitis of right lower limb: Secondary | ICD-10-CM

## 2021-04-12 DIAGNOSIS — I5032 Chronic diastolic (congestive) heart failure: Secondary | ICD-10-CM | POA: Diagnosis not present

## 2021-04-12 DIAGNOSIS — I872 Venous insufficiency (chronic) (peripheral): Secondary | ICD-10-CM | POA: Diagnosis not present

## 2021-04-12 DIAGNOSIS — Z23 Encounter for immunization: Secondary | ICD-10-CM | POA: Diagnosis not present

## 2021-04-12 DIAGNOSIS — Z515 Encounter for palliative care: Secondary | ICD-10-CM | POA: Diagnosis not present

## 2021-04-12 LAB — HEMOGLOBIN AND HEMATOCRIT, BLOOD
HCT: 40.5 % (ref 39.0–52.0)
Hemoglobin: 12.7 g/dL — ABNORMAL LOW (ref 13.0–17.0)

## 2021-04-12 NOTE — Progress Notes (Signed)
Location:  [facility] Room Number: 222 Place of Service:  SNF (31)   CODE STATUS: full code   No Known Allergies  Chief Complaint  Patient presents with  . Acute Visit    Hematoma     HPI:  He has a significant hematoma on his right hip. He had had a fall on 04-01-21 and he was complaining of right hip pain. He had an x-ray of the hip done without fracture present. I was told by staff today; that his hematoma is quite large. He does have pain to touch; the area is hard to touch and is hot to touch. There are no reports of fevers present.   Past Medical History:  Diagnosis Date  . A-fib (HCC)   . Cellulitis   . CHF (congestive heart failure) (HCC)   . Hemorrhoid   . Hypertension   . Prostate disorder     Past Surgical History:  Procedure Laterality Date  . BIOPSY  03/17/2020   Procedure: BIOPSY;  Surgeon: Corbin Ade, MD;  [facility]: [facility];  Service: Endoscopy;;  gastric rectal  . ESOPHAGEAL DILATION N/A 03/17/2020   Procedure: ESOPHAGEAL DILATION;  Surgeon: Corbin Ade, MD;  [facility];  Service: Endoscopy;  Laterality: N/A;  . ESOPHAGOGASTRODUODENOSCOPY (EGD) WITH PROPOFOL N/A 03/17/2020   Procedure: ESOPHAGOGASTRODUODENOSCOPY (EGD) WITH PROPOFOL;  Surgeon: Corbin Ade, MD;  [facility];  Service: Endoscopy;  Laterality: N/A;  . FLEXIBLE SIGMOIDOSCOPY N/A 03/17/2020   Procedure: FLEXIBLE SIGMOIDOSCOPY WITH PROPOFOL;  Surgeon: Corbin Ade, MD;  [facility];  Service: Endoscopy;  Laterality: N/A;  . HEMORRHOID SURGERY     patient reports several hemorrhoid surgeries in the past    Social History   Socioeconomic History  . Marital status: Legally Separated    Spouse name: Not on file  . Number of children: Not on file  . Years of education: Not on file  . Highest education level: Not on file  Occupational History  . Occupation: retired   Tobacco Use  . Smoking status: Former Games developer  .  Smokeless tobacco: Never Used  Vaping Use  . Vaping Use: Never used  Substance and Sexual Activity  . Alcohol use: Yes    Comment: "very little"  . Drug use: Never  . Sexual activity: Not Currently  Other Topics Concern  . Not on file  Social History Narrative   On his third marriage x 18 years. Has 3 children and several grandchildren. His son lives in [place], [place]  and is primary contact. 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 .  Former smoker.    Long term resident of SNF       Social Determinants of Health   Financial Resource Strain: Not on file  Food Insecurity: Not on file  Transportation Needs: Not on file  Physical Activity: Not on file  Stress: Not on file  Social Connections: Not on file  Intimate Partner Violence: Not on file   Family History  Problem Relation Age of Onset  . Heart disease Mother   . Colon cancer Neg Hx       VITAL SIGNS BP 136/76   Pulse 78   Temp 98.3 F (36.8 C)   Resp 20   Ht 5\' 5"  (1.651 m)   Wt 171 lb 6.4 oz (77.7 kg)   BMI 28.52 kg/m   Outpatient Encounter Medications as of 04/12/2021  Medication Sig  . acetaminophen (TYLENOL) 325 MG tablet Take 2 tablets (650 mg total) by mouth every 6 (six) hours as needed for mild pain, fever or headache (or Fever >/= 101).  Marland Kitchen albuterol (VENTOLIN HFA) 108 (90 Base) MCG/ACT inhaler Inhale 2 puffs into the lungs every 6 (six) hours as needed for wheezing or shortness of breath.  Marland Kitchen apixaban (ELIQUIS) 5 MG TABS tablet Take 1 tablet (5 mg total) by mouth 2 (two) times daily.  . Ascorbic Acid (VITAMIN C) 1000 MG tablet Take 1,000 mg by mouth daily.  [name] [place]-Castor Oil (VENELEX) OINT Apply 1 application topically in the morning, at noon, and at bedtime.  . carboxymethylcellul-glycerin (OPTIVE) 0.5-0.9 % ophthalmic solution Place 1 drop into both eyes 4 (four) times daily. For dry eyes  . gabapentin (NEURONTIN) 100 MG capsule Take 1 capsule (100 mg total) by mouth at bedtime.   . metoprolol succinate (TOPROL-XL) 25 MG 24 hr tablet Take 0.5 tablets (12.5 mg total) by mouth daily.  . NON FORMULARY BiPap at setting of 20/16 while sleeping. Twice A Day  . NON FORMULARY Diet: _____ Regular, __x____ NAS, _______Consistent Carbohydrate, _______NPO _____Other  . OXYGEN Inhale 2 L into the lungs continuous.   . pantoprazole (PROTONIX) 40 MG tablet Take 1 tablet (40 mg total) by mouth daily.  . polyethylene glycol (MIRALAX / GLYCOLAX) 17 g packet Take 17 g by mouth daily.  . sertraline (ZOLOFT) 50 MG tablet Take 50 mg by mouth daily.  . Skin Protectants, Misc. (EUCERIN) cream Apply 1 application topically. Once a week on Wednesday. Special Instructions: Bilateral lower extremities- Clean legs with soap and water. Pat dry. Apply Eucerin cream to bilateral legs, apply kerlix, then apply coban wrap from toes to knees  . sodium chloride (OCEAN) 0.65 % SOLN nasal spray Place 2 sprays into both nostrils every 6 (six) hours as needed (For Dry Stuffy Nose).  . tamsulosin (FLOMAX) 0.4 MG CAPS capsule Take 1 capsule (0.4 mg total) by mouth daily after supper.   No facility-administered encounter medications on file as of 04/12/2021.     SIGNIFICANT DIAGNOSTIC EXAMS   PREVIOUS  05-27-20: chest x-ray:  1. No acute abnormality. 2. Stable cardiomegaly and mild chronic bronchitic changes. 3. Stable elevated right hemidiaphragm  05-27-20: ct of head: Mild atrophy. No acute abnormality   05-27-20: MRI of brain:  No acute abnormality Motion degraded study Atrophy with mild chronic microvascular ischemic change in the white matter.  05-27-20: MRI of lumbar spine:  1. Image quality degraded by moderate motion. 2. Moderate levoscoliosis lumbar spine. Multilevel degenerative changes throughout the lumbar spine with spinal and foraminal stenosis as described above. 3. Moderate spinal stenosis L2-3 with moderate subarticular and foraminal stenosis right greater than left 4. 7 mm  anterolisthesis L5-S1 with possible pars defect on the left. Marked left foraminal encroachment with impingement left L5 nerveroot. 5. These results will be called to the ordering clinician or representative by the Radiologist Assistant, and communication documented in the PACS or [employer].  TODAY   04-03-21: right hip x-ray: No acute bone abnormality. A follow-up study is recommended if patient's pain persists.   LABS REVIEWED PREVIOUS  05-27-20: wbc 10.3; hgb 14.1; hct 44.3; mcv 102.1 plt 199; glucose 99 bun 22; creat 0.8; k+ 3.7; na++ 140; ca 9.6 liver normal albumin 3.2 BNP 281; tsh 0.465; mag 2.0; urine culture no growth 05-30-20: wbc 13.2; hgb 13.0; hct 40.5; mcv 102.0 plt 197  07-28-20: vit D 93.39 11-10-20:  wbc 7.3; hgb 13.7; hct 44.4; mcv 100.9 plt 156; glucose 75; bun 13; creat 0.73; k+ 3.8; na++ 149; ca 9.2 liver normal albumin 3.1 chol 83; ldl 40; trig 38; hdl 35; tsh 1.020 mag 1.9; vit D 52.71 01-24-21: wbc 8.4; hgb 15.4; hct 48.8; mcv 98.6 plt 174; glucose 91; bun 12; creat 0.64; k+ 4.0; na++ 142; ca 9.4 GFR>60  03-07-21: wbc 9.5; hgb 16.3; hct 51.8; mcv 99.2 plt 179 ;glucose 80; bun 15; creat 0.75; k+ 3.7; na++ 144; ca 9.4; GFR>60 03-09-21: urine culture no growth  Review of Systems  Constitutional: Negative for malaise/fatigue.  Respiratory: Negative for cough and shortness of breath.   Cardiovascular: Negative for chest pain, palpitations and leg swelling.  Gastrointestinal: Negative for abdominal pain, constipation and heartburn.  Musculoskeletal: Positive for joint pain. Negative for back pain and myalgias.       Right hip pain   Skin:       Has hematoma right hip   Neurological: Negative for dizziness.  Psychiatric/Behavioral: The patient is not nervous/anxious.    Physical Exam Constitutional:      General: He is not in acute distress.    Appearance: He is well-developed. He is not diaphoretic.  Neck:     Thyroid: No thyromegaly.  Cardiovascular:     Rate and  Rhythm: Normal rate and regular rhythm.     Pulses: Normal pulses.     Heart sounds: Normal heart sounds.  Pulmonary:     Effort: Pulmonary effort is normal. No respiratory distress.     Breath sounds: Normal breath sounds.  Abdominal:     General: Bowel sounds are normal. There is no distension.     Palpations: Abdomen is soft.     Tenderness: There is no abdominal tenderness.  Musculoskeletal:        General: Normal range of motion.     Cervical back: Neck supple.     Right lower leg: No edema.     Left lower leg: No edema.  Lymphadenopathy:     Cervical: No cervical adenopathy.  Skin:    General: Skin is warm and dry.     Comments: Bilateral lower extremities discolored Significant hematoma from right hip to knee. Is red; hot; tender hard   Neurological:     Mental Status: He is alert and oriented to person, place, and time.  Psychiatric:        Mood and Affect: Mood normal.       ASSESSMENT/ PLAN:  TODAY  1. Cellulitis right hip  2. Hematoma right hip  Will begin doxycycline 100 mg twice daily through 04-21-21 Will hold eliquis for 3 days Will monitor his status.    Synthia Innocent NP [facility]  Contact [phone] Monday through Friday 8am- 5pm  After hours call [phone]

## 2021-04-13 DIAGNOSIS — M6281 Muscle weakness (generalized): Secondary | ICD-10-CM | POA: Diagnosis not present

## 2021-04-13 DIAGNOSIS — M5416 Radiculopathy, lumbar region: Secondary | ICD-10-CM | POA: Diagnosis not present

## 2021-04-25 DIAGNOSIS — I739 Peripheral vascular disease, unspecified: Secondary | ICD-10-CM | POA: Diagnosis not present

## 2021-04-25 DIAGNOSIS — B351 Tinea unguium: Secondary | ICD-10-CM | POA: Diagnosis not present

## 2021-04-26 DIAGNOSIS — I5032 Chronic diastolic (congestive) heart failure: Secondary | ICD-10-CM | POA: Diagnosis not present

## 2021-04-26 DIAGNOSIS — Z515 Encounter for palliative care: Secondary | ICD-10-CM | POA: Diagnosis not present

## 2021-04-26 DIAGNOSIS — I872 Venous insufficiency (chronic) (peripheral): Secondary | ICD-10-CM | POA: Diagnosis not present

## 2021-04-27 ENCOUNTER — Encounter (HOSPITAL_BASED_OUTPATIENT_CLINIC_OR_DEPARTMENT_OTHER): Payer: Medicare Other | Attending: Internal Medicine | Admitting: Internal Medicine

## 2021-04-27 DIAGNOSIS — S81801A Unspecified open wound, right lower leg, initial encounter: Secondary | ICD-10-CM | POA: Insufficient documentation

## 2021-04-27 DIAGNOSIS — Z87891 Personal history of nicotine dependence: Secondary | ICD-10-CM | POA: Insufficient documentation

## 2021-04-27 DIAGNOSIS — X58XXXA Exposure to other specified factors, initial encounter: Secondary | ICD-10-CM | POA: Diagnosis not present

## 2021-04-27 DIAGNOSIS — L97819 Non-pressure chronic ulcer of other part of right lower leg with unspecified severity: Secondary | ICD-10-CM | POA: Diagnosis not present

## 2021-04-27 DIAGNOSIS — I110 Hypertensive heart disease with heart failure: Secondary | ICD-10-CM | POA: Insufficient documentation

## 2021-04-27 DIAGNOSIS — Z8249 Family history of ischemic heart disease and other diseases of the circulatory system: Secondary | ICD-10-CM | POA: Diagnosis not present

## 2021-04-27 DIAGNOSIS — I482 Chronic atrial fibrillation, unspecified: Secondary | ICD-10-CM | POA: Diagnosis not present

## 2021-04-27 DIAGNOSIS — I509 Heart failure, unspecified: Secondary | ICD-10-CM | POA: Insufficient documentation

## 2021-04-27 DIAGNOSIS — I872 Venous insufficiency (chronic) (peripheral): Secondary | ICD-10-CM

## 2021-04-27 NOTE — Progress Notes (Signed)
Jesus Fowler, Jesus Fowler ([id number]) Visit Report for 04/27/2021 Abuse/Suicide Risk Screen Details Patient Name: Date of Service: Jesus Fowler, Jesus Fowler 04/27/2021 1:15 PM Medical Record Number: [id number] Patient Account Number: [id number] Date of Birth/Sex: Treating RN: 16-Oct-1933 (87 y.o. Male) Antonieta Iba Primary Care Lamin Chandley: Raymon Mutton RA H Other Clinician: Referring Keymani Glynn: Treating Rahm Minix/Extender: Truddie Hidden, DEBO RA H Weeks in Treatment: 0 Abuse/Suicide Risk Screen Items Answer ABUSE RISK SCREEN: Has anyone close to you tried to hurt or harm you recentlyo No Do you feel uncomfortable with anyone in your familyo No Has anyone forced you do things that you didnt want to doo No Notes Is at [facility] Electronic Signature(s) Signed: 04/27/2021 5:13:18 PM By: Antonieta Iba Entered By: Antonieta Iba on 04/27/2021 13:34:55 -------------------------------------------------------------------------------- Activities of Daily Living Details Patient Name: Date of Service: Jesus Fowler, Jesus Fowler 04/27/2021 1:15 PM Medical Record Number: [id number] Patient Account Number: [id number] Date of Birth/Sex: Treating RN: Jun 13, 1933 (87 y.o. Male) Antonieta Iba Primary Care Broc Caspers: Raymon Mutton RA H Other Clinician: Referring Sanaa Zilberman: Treating Phelix Fudala/Extender: Truddie Hidden, DEBO RA H Weeks in Treatment: 0 Activities of Daily Living Items Answer Activities of Daily Living (Please select one for each item) Drive Automobile Not Able T Medications ake Need Assistance Use T elephone Need Assistance Care for Appearance Need Assistance Use T oilet Need Assistance Bath / Shower Need Assistance Dress Self Need Assistance Feed Self Completely Able Walk Need Assistance Get In / Out Bed Need Assistance Housework Not Able Prepare Meals Need Assistance Handle Money Need Assistance Shop for Self Not Able Electronic Signature(s) Signed: 04/27/2021 5:13:18 PM By: Antonieta Iba Signed: 04/27/2021 5:13:18 PM By: Antonieta Iba Entered By: Antonieta Iba on 04/27/2021 13:35:45 -------------------------------------------------------------------------------- Education Screening Details Patient Name: Date of Service: Jesus Fowler, Jesus Fowler 04/27/2021 1:15 PM Medical Record Number: [id number] Patient Account Number: [id number] Date of Birth/Sex: Treating RN: Feb 19, 1933 (87 y.o. Male) Antonieta Iba Primary Care Derrik Mceachern: Raymon Mutton RA H Other Clinician: Referring Tishara Pizano: Treating Rissa Turley/Extender: Truddie Hidden, DEBO RA H Weeks in Treatment: 0 Primary Learner Assessed: Patient Learning Preferences/Education Level/Primary Language Learning Preference: Explanation, Demonstration, Printed Material Preferred Language: English Cognitive Barrier Language Barrier: No Translator Needed: No Memory Deficit: No Emotional Barrier: No Cultural/Religious Beliefs Affecting Medical Care: No Physical Barrier Impaired Vision: Yes Macular Degeneration Impaired Hearing: No Decreased Hand dexterity: No Knowledge/Comprehension Knowledge Level: Medium Comprehension Level: High Ability to understand written instructions: Medium Ability to understand verbal instructions: High Motivation Anxiety Level: Calm Cooperation: Cooperative Education Importance: Acknowledges Need Interest in Health Problems: Asks Questions Perception: Coherent Willingness to Engage in Self-Management High Activities: Readiness to Engage in Self-Management High Activities: Electronic Signature(s) Signed: 04/27/2021 5:13:18 PM By: Antonieta Iba Entered By: Antonieta Iba on 04/27/2021 13:37:37 -------------------------------------------------------------------------------- Fall Risk Assessment Details Patient Name: Date of Service: Jesus Fowler, Jesus Fowler 04/27/2021 1:15 PM Medical Record Number: [id number] Patient Account Number: [id number] Date of Birth/Sex: Treating RN: July 28, 1933 (87  y.o. Male) Antonieta Iba Primary Care Salayah Meares: Raymon Mutton RA H Other Clinician: Referring Marvelyn Bouchillon: Treating Arman Loy/Extender: Truddie Hidden, DEBO RA H Weeks in Treatment: 0 Fall Risk Assessment Items Have you had 2 or more falls in the last 12 monthso 0 Yes Have you had any fall that resulted in injury in the last 12 monthso 0 No FALLS RISK SCREEN History of falling - immediate or within 3 months 25 Yes Secondary diagnosis (Do you have 2 or more medical diagnoseso) 0 No Ambulatory aid None/bed rest/wheelchair/nurse 0 Yes Crutches/cane/walker 0 No Furniture 0 No Intravenous therapy  Access/Saline/Heparin Lock 0 No Gait/Transferring Normal/ bed rest/ wheelchair 0 Yes Weak (short steps with or without shuffle, stooped but able to lift head while walking, may seek 0 No support from furniture) Impaired (short steps with shuffle, may have difficulty arising from chair, head down, impaired 0 No balance) Mental Status Oriented to own ability 0 Yes Electronic Signature(s) Signed: 04/27/2021 5:13:18 PM By: Antonieta Iba Entered By: Antonieta Iba on 04/27/2021 13:38:01 -------------------------------------------------------------------------------- Foot Assessment Details Patient Name: Date of Service: Jesus Fowler 04/27/2021 1:15 PM Medical Record Number: [id number] Patient Account Number: [id number] Date of Birth/Sex: Treating RN: Feb 18, 1933 (87 y.o. Male) Antonieta Iba Primary Care Tarrin Menn: Raymon Mutton RA H Other Clinician: Referring Jehan Ranganathan: Treating Analiese Krupka/Extender: Truddie Hidden, DEBO RA H Weeks in Treatment: 0 Foot Assessment Items Site Locations + = Sensation present, - = Sensation absent, C = Callus, U = Ulcer R = Redness, W = Warmth, M = Maceration, PU = Pre-ulcerative lesion F = Fissure, S = Swelling, D = Dryness Assessment Right: Left: Other Deformity: No No Prior Foot Ulcer: No No Prior Amputation: No No Charcot Joint: No  No Ambulatory Status: Ambulatory With Help Assistance Device: Wheelchair Gait: Steady Electronic Signature(s) Signed: 04/27/2021 5:13:18 PM By: Antonieta Iba Entered By: Antonieta Iba on 04/27/2021 14:01:06 -------------------------------------------------------------------------------- Nutrition Risk Screening Details Patient Name: Date of Service: Jesus Fowler, Jesus Fowler 04/27/2021 1:15 PM Medical Record Number: [id number] Patient Account Number: [id number] Date of Birth/Sex: Treating RN: October 03, 1933 (87 y.o. Male) Antonieta Iba Primary Care Jacquese Cassarino: Raymon Mutton RA H Other Clinician: Referring Kiylah Loyer: Treating Claretta Kendra/Extender: Truddie Hidden, DEBO RA H Weeks in Treatment: 0 Height (in): Weight (lbs): 168 Body Mass Index (BMI): Nutrition Risk Screening Items Score Screening NUTRITION RISK SCREEN: I have an illness or condition that made me change the kind and/or amount of food I eat 0 No I eat fewer than two meals per day 0 No I eat few fruits and vegetables, or milk products 0 No I have three or more drinks of beer, liquor or wine almost every day 0 No I have tooth or mouth problems that make it hard for me to eat 0 No I don't always have enough money to buy the food I need 0 No I eat alone most of the time 0 No I take three or more different prescribed or over-the-counter drugs a day 1 Yes Without wanting to, I have lost or gained 10 pounds in the last six months 0 No I am not always physically able to shop, cook and/or feed myself 0 No Nutrition Protocols Good Risk Protocol 0 No interventions needed Moderate Risk Protocol High Risk Proctocol Risk Level: Good Risk Score: 1 Electronic Signature(s) Signed: 04/27/2021 5:13:18 PM By: Antonieta Iba Entered By: Antonieta Iba on 04/27/2021 13:45:49

## 2021-04-27 NOTE — Progress Notes (Signed)
Georgiana ShoreSCRUGGS, [place]landa ([id number]) Visit Report for 04/27/2021 Chief Complaint Document Details Patient Name: Date of Service: [place] Gain[place]CRUGGS[place] URICE 04/27/2021 1:15 PM Medical Record Number: [id number] Patient Account Number: [id number] Date of Birth/Sex: Treating RN: 13-Dec-1933 (87 y.o. Male) Shawn Stalleaton, Bobbi Primary Care Provider: Raymon MuttonGREEN, DEBO RA H Other Clinician: Referring Provider: Treating Provider/Extender: Truddie HiddenHoffman, Iasia Forcier GREEN, DEBO RA H Weeks in Treatment: 0 Information Obtained from: Patient Chief Complaint Right lower extremity wound Electronic Signature(s) Signed: 04/27/2021 6:22:56 PM By: Geralyn CorwinHoffman, Cashmere Harmes DO Entered By: Geralyn CorwinHoffman, Essica Kiker on 04/27/2021 18:14:15 -------------------------------------------------------------------------------- HPI Details Patient Name: Date of Service: [place] URICE 04/27/2021 1:15 PM Medical Record Number: [id number] Patient Account Number: [id number] Date of Birth/Sex: Treating RN: 13-Dec-1933 (87 y.o. Male) Shawn Stalleaton, Bobbi Primary Care Provider: Raymon MuttonGREEN, DEBO RA H Other Clinician: Referring Provider: Treating Provider/Extender: Truddie HiddenHoffman, Dotsie Gillette GREEN, DEBO RA H Weeks in Treatment: 0 History of Present Illness HPI Description: Admission 4/28 Mr. Laurance FlattenScruggs is an 87 year old male with a past medical history of chronic A. fib, obstructive sleep apnea on CPAP, chronic venous insufficiency that presents today for evaluation of his lower extremity swelling and right lower extremity wound. He was referred to our clinic by vein and vascular. He saw them on 03/27/2021 and was noted to have noncompressible arteries on arterial duplex. He had TBI's that showed 0.61 on the right and 0.54 on the left. He was thought to have adequate blood supply to his legs for wound healing. He presents today for evaluation. He has been wrapped in Kerlix and Coban at his facility. He denies any pain. Electronic Signature(s) Signed: 04/27/2021 6:22:56 PM By: Geralyn CorwinHoffman, Zooey Schreurs DO Entered  By: Geralyn CorwinHoffman, Lynna Zamorano on 04/27/2021 18:19:10 -------------------------------------------------------------------------------- Physical Exam Details Patient Name: Date of Service: Mil[place] Gain[place]CRUGG[place] URICE 04/27/2021 1:15 PM Medical Record Number: [id number] Patient Account Number: [id number] Date of Birth/Sex: Treating RN: 13-Dec-1933 (87 y.o. Male) Shawn Stalleaton, Bobbi Primary Care Provider: Raymon MuttonGREEN, DEBO RA H Other Clinician: Referring Provider: Treating Provider/Extender: Truddie HiddenHoffman, Tamir Wallman GREEN, DEBO RA H Weeks in Treatment: 0 Constitutional respirations regular, non-labored and within target range for patient.. Cardiovascular 2+ dorsalis pedis/posterior tibialis pulses. Psychiatric pleasant and cooperative. Notes Right posterior ankle wound limited to skin breakdown. There are no signs of infection. He has severely dry skin to his legs bilaterally that is flaking. He has venous stasis changes bilaterally. Electronic Signature(s) Signed: 04/27/2021 6:22:56 PM By: Geralyn CorwinHoffman, Trentyn Boisclair DO Entered By: Geralyn CorwinHoffman, Shastina Rua on 04/27/2021 18:19:23 -------------------------------------------------------------------------------- Physician Orders Details Patient Name: Date of Service: [place] G[place]UGG[place] URICE 04/27/2021 1:15 PM Medical Record Number: [id number] Patient Account Number: [id number] Date of Birth/Sex: Treating RN: 13-Dec-1933 [age] y.o. Male) Shawn Stalleaton, Bobbi Primary Care Provider: Raymon MuttonGREEN, DEBO RA H Other Clinician: Referring Provider: Treating Provider/Extender: Truddie HiddenHoffman, Evora Schechter GREEN, DEBO RA H Weeks in Treatment: 0 Verbal / Phone Orders: No Diagnosis Coding ICD-10 Coding Code Description L97.819 Non-pressure chronic ulcer of other part of right lower leg with unspecified severity I87.2 Venous insufficiency (chronic) (peripheral) I48.20 Chronic atrial fibrillation, unspecified Follow-up Appointments ppointment in 1 week. - Dr. Mikey BussingHoffman Return A Bathing/ Shower/ Hygiene Do not shower or bathe in tub. -  cannot get compression wraps wet. Edema Control - Lymphedema / SCD / Other Elevate legs to the level of the heart or above for 30 minutes daily and/or when sitting, a frequency of: - throughout the day. Avoid standing for long periods of time. Exercise regularly Compression stocking or Garment 10-20 mm/Hg pressure to: - Daughter to order and purchase compression stockings. Please bring on next appointment time. Additional Orders / Instructions Other: -  Nursing Facility to leave compression wraps in place. Do not get wet. Non Wound Condition Left Lower Extremity Other Non Wound Condition Orders/Instructions: - Wound Center to apply lotion, kerlix and coban to left leg. Wound Treatment Wound #1 - Ankle Wound Laterality: Right, Posterior Cleanser: Soap and Water 1 x Per Week Discharge Instructions: May shower and wash wound with dial antibacterial soap and water prior to dressing change. Cleanser: Wound Cleanser 1 x Per Week Discharge Instructions: Cleanse the wound with wound cleanser prior to applying a clean dressing using gauze sponges, not tissue or cotton balls. Peri-Wound Care: Sween Lotion (Moisturizing lotion) 1 x Per Week Discharge Instructions: Apply moisturizing lotion to right leg as directed Prim Dressing: PolyMem Silver Non-Adhesive Dressing, 4.25x4.25 in 1 x Per Week ary Discharge Instructions: Apply to wound bed as instructed Secondary Dressing: Woven Gauze Sponge, Non-Sterile 4x4 in 1 x Per Week Discharge Instructions: Apply over primary dressing as directed. Compression Wrap: Kerlix Roll 4.5x3.1 (in/yd) 1 x Per Week Discharge Instructions: **Apply Kerlix and Coban compression as directed. Compression Wrap: Coban Self-Adherent Wrap 4x5 (in/yd) 1 x Per Week Discharge Instructions: Apply over Kerlix as directed. Patient Medications llergies: No Known Allergies A Notifications Medication Indication Start End lidocaine DOSE topical 4 % gel - gel topical applied only in  wound clinic. Electronic Signature(s) Signed: 04/27/2021 6:22:56 PM By: Geralyn Corwin DO Entered By: Geralyn Corwin on 04/27/2021 18:19:38 Prescription 04/27/2021 -------------------------------------------------------------------------------- Wallene Huh DO Patient Name: Provider: 09/01/1933 [id number] Date of Birth: NPI#: Male [id number] Sex: DEA #: [phone] Phone #: License #: Eligha Bridegroom [facility] Patient Address: [address] [place], [place] [address] [address] [place], [place] [address] [phone] Allergies No Known Allergies Medication Medication: Route: Strength: Form: lidocaine 4 % topical gel topical 4% gel Class: TOPICAL LOCAL ANESTHETICS Dose: Frequency / Time: Indication: gel topical applied only in wound clinic. Number of Refills: Number of Units: 0 Generic Substitution: Start Date: End Date: One Time Use: Substitution Permitted No Note to Pharmacy: Hand Signature: Date(s): Electronic Signature(s) Signed: 04/27/2021 6:22:56 PM By: Geralyn Corwin DO Entered By: Geralyn Corwin on 04/27/2021 18:19:38 -------------------------------------------------------------------------------- Problem List Details Patient Name: Date of Service: DONYEA, BEVERLIN 04/27/2021 1:15 PM Medical Record Number: [id number] Patient Account Number: [id number] Date of Birth/Sex: Treating RN: 1933/01/15 (87 y.o. Male) Shawn Stall Primary Care Provider: Raymon Mutton RA H Other Clinician: Referring Provider: Treating Provider/Extender: Truddie Hidden, DEBO RA H Weeks in Treatment: 0 Active Problems ICD-10 Encounter Code Description Active Date MDM Diagnosis L97.819 Non-pressure chronic ulcer of other part of right lower leg with unspecified 04/27/2021 No Yes severity I87.2 Venous insufficiency (chronic) (peripheral) 04/27/2021 No Yes I48.20 Chronic atrial fibrillation,  unspecified 04/27/2021 No Yes Inactive Problems Resolved Problems Electronic Signature(s) Signed: 04/27/2021 6:22:56 PM By: Geralyn Corwin DO Entered By: Geralyn Corwin on 04/27/2021 18:13:46 -------------------------------------------------------------------------------- Progress Note Details Patient Name: Date of Service: [place], [place] URICE 04/27/2021 1:15 PM Medical Record Number: [id number] Patient Account Number: [id number] Date of Birth/Sex: Treating RN: November 28, 1933 (87 y.o. Male) Shawn Stall Primary Care Provider: Raymon Mutton RA H Other Clinician: Referring Provider: Treating Provider/Extender: Truddie Hidden, DEBO RA H Weeks in Treatment: 0 Subjective Chief Complaint Information obtained from Patient Right lower extremity wound History of Present Illness (HPI) Admission 4/28 Mr. Gondek is an 87 year old male with a past medical history of chronic A. fib, obstructive sleep apnea on CPAP, chronic venous insufficiency that presents today for evaluation of his lower extremity  swelling and right lower extremity wound. He was referred to our clinic by vein and vascular. He saw them on 03/27/2021 and was noted to have noncompressible arteries on arterial duplex. He had TBI's that showed 0.61 on the right and 0.54 on the left. He was thought to have adequate blood supply to his legs for wound healing. He presents today for evaluation. He has been wrapped in Kerlix and Coban at his facility. He denies any pain. Patient History Information obtained from Patient. Allergies No Known Allergies Family History Cancer - Mother, Heart Disease - Mother, Hypertension - Mother, No family history of Diabetes, Hereditary Spherocytosis, Kidney Disease, Lung Disease, Seizures, Stroke, Thyroid Problems, Tuberculosis. Social History Former smoker, Marital Status - Separated, Alcohol Use - Rarely, Drug Use - No History, Caffeine Use - Daily - Tea. 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 Ear/Nose/Mouth/Throat Complains or has symptoms of Chronic sinus problems or rhinitis. Respiratory Complains or has symptoms of Chronic or frequent coughs - 02 prn. Gastrointestinal Denies complaints or symptoms of Frequent diarrhea, Nausea, Vomiting. Endocrine Denies complaints or symptoms of Heat/cold intolerance. Integumentary (Skin) Complains or has symptoms of Wounds. Neurologic Denies complaints or symptoms of Numbness/parasthesias. Psychiatric Denies complaints or symptoms of Claustrophobia, Suicidal. Objective Constitutional respirations regular, non-labored and within target range for patient.. Vitals Time Taken: 1:30 PM, Weight: 168 lbs, Source: Stated, Temperature: 98 F, Pulse: 78 bpm, Respiratory Rate: 20 breaths/min, Blood Pressure: 154/75 mmHg, Pulse Oximetry: 92 %. Cardiovascular 2+ dorsalis pedis/posterior tibialis pulses. Psychiatric pleasant and cooperative. General Notes: Right posterior ankle wound limited to skin breakdown. There are no signs of infection. He has severely dry skin to his legs bilaterally that is flaking. He has venous stasis changes bilaterally. Integumentary (Hair, Skin) Wound #1 status is Open. Original cause of wound was Blister. The date acquired was: 03/22/2021. The wound is located on the Right,Posterior Ankle. The wound measures 1.5cm length x 2cm width x 0.1cm depth; 2.356cm^2 area and 0.236cm^3 volume. There is Fat Layer (Subcutaneous  Tissue) exposed. There is no tunneling or undermining noted. There is a medium amount of serosanguineous drainage noted. The wound margin is distinct with the outline attached to the wound base. There is large (67-100%) red granulation within the wound bed. There is no necrotic tissue within the wound bed. Assessment Active Problems ICD-10 Non-pressure chronic ulcer of other part of right lower leg with unspecified severity Venous insufficiency (chronic) (peripheral) Chronic atrial fibrillation, unspecified Patient's legs do not present with very much swelling. In fact they are very thin however he does have venous insufficiency based on his skin changes. He would benefit from continued compression therapy. The right lower extremity wound is limited to skin breakdown. We will use PolyMem silver to this. We will lotion and put triamcinolone cream to his legs bilaterally to help with the dryness. We will see him at follow-up in 1 week. Recommended getting compression stockings in the meantime and to bring them to his next appointment. Plan Follow-up Appointments: Return Appointment in 1 week. - Dr. Mikey Bussing Bathing/ Shower/ Hygiene: Do not shower or bathe in tub. - cannot get compression wraps wet.  Edema Control - Lymphedema / SCD / Other: Elevate legs to the level of the heart or above for 30 minutes daily and/or when sitting, a frequency of: - throughout the day. Avoid standing for long periods of time. Exercise regularly Compression stocking or Garment 10-20 mm/Hg pressure to: - Daughter to order and purchase compression stockings. Please bring on next appointment time. Additional Orders / Instructions: Other: - Nursing Facility to leave compression wraps in place. Do not get wet. Non Wound Condition: Other Non Wound Condition Orders/Instructions: - Wound Center to apply lotion, kerlix and coban to left leg. The following medication(s) was prescribed: lidocaine topical 4 % gel gel topical  applied only in wound clinic. was prescribed at facility WOUND #1: - Ankle Wound Laterality: Right, Posterior Cleanser: Soap and Water 1 x Per Week/ Discharge Instructions: May shower and wash wound with dial antibacterial soap and water prior to dressing change. Cleanser: Wound Cleanser 1 x Per Week/ Discharge Instructions: Cleanse the wound with wound cleanser prior to applying a clean dressing using gauze sponges, not tissue or cotton balls. Peri-Wound Care: Sween Lotion (Moisturizing lotion) 1 x Per Week/ Discharge Instructions: Apply moisturizing lotion to right leg as directed Prim Dressing: PolyMem Silver Non-Adhesive Dressing, 4.25x4.25 in 1 x Per Week/ ary Discharge Instructions: Apply to wound bed as instructed Secondary Dressing: Woven Gauze Sponge, Non-Sterile 4x4 in 1 x Per Week/ Discharge Instructions: Apply over primary dressing as directed. Com pression Wrap: Kerlix Roll 4.5x3.1 (in/yd) 1 x Per Week/ Discharge Instructions: **Apply Kerlix and Coban compression as directed. Com pression Wrap: Coban Self-Adherent Wrap 4x5 (in/yd) 1 x Per Week/ Discharge Instructions: Apply over Kerlix as directed. 1. PolyMem silver underneath Kerlix/Coban 2. Obtain compression stockings 3. Follow-up in 1 week Electronic Signature(s) Signed: 04/27/2021 6:22:56 PM By: Geralyn Corwin DO Entered By: Geralyn Corwin on 04/27/2021 18:21:40 -------------------------------------------------------------------------------- HxROS Details Patient Name: Date of Service: [place], [place] URICE 04/27/2021 1:15 PM Medical Record Number: [id number] Patient Account Number: [id number] Date of Birth/Sex: Treating RN: 1933/06/08 (87 y.o. Male) Antonieta Iba Primary Care Provider: Raymon Mutton RA H Other Clinician: Referring Provider: Treating Provider/Extender: Truddie Hidden, DEBO RA H Weeks in Treatment: 0 Information Obtained From Patient Eyes Complaints and Symptoms: Positive for: Vision  Changes - Macular Degeneration Medical History: Positive for: Cataracts Ear/Nose/Mouth/Throat Complaints and Symptoms: Positive for: Chronic sinus problems or rhinitis Medical History: Positive for: Chronic sinus problems/congestion Respiratory Complaints and Symptoms: Positive for: Chronic or frequent coughs - 02 prn Medical History: Positive for: Sleep Apnea Past Medical History Notes: 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 Gastrointestinal Complaints and Symptoms: Negative for: Frequent diarrhea; Nausea; Vomiting Endocrine Complaints and Symptoms: Negative for: Heat/cold intolerance Integumentary (Skin) Complaints and Symptoms: Positive for: Wounds Neurologic Complaints and Symptoms: Negative for: Numbness/parasthesias Psychiatric Complaints and Symptoms: Negative for: Claustrophobia; Suicidal Hematologic/Lymphatic Cardiovascular Medical History: Positive for: Arrhythmia - A-Fib; Congestive Heart Failure; Hypertension Genitourinary Medical History: Past Medical History Notes: Prostate Disorder Immunological Musculoskeletal Medical History: Positive for: Osteoarthritis Oncologic HBO Extended History Items Ear/Nose/Mouth/Throat: Eyes: Chronic sinus Cataracts problems/congestion Immunizations Pneumococcal Vaccine: Received Pneumococcal Vaccination: Yes Implantable Devices None Family and Social History Cancer: Yes - Mother; Diabetes: No; Heart Disease: Yes - Mother; Hereditary Spherocytosis: No; Hypertension: Yes - Mother; Kidney Disease: No; Lung Disease: No; Seizures: No; Stroke: No; Thyroid Problems: No; Tuberculosis: No; Former smoker; Marital Status - Separated; Alcohol Use: Rarely; Drug Use: No History; Caffeine Use: Daily - T Financial Concerns: No; Food, Clothing or Shelter Needs: No; Support System Lacking: No; Transportation Concerns: ea;  No Electronic Signature(s) Signed: 04/27/2021 5:13:18 PM By: Antonieta Iba Signed: 04/27/2021 6:22:56 PM By: Geralyn Corwin  DO Entered By: Antonieta Iba on 04/27/2021 13:45:19 -------------------------------------------------------------------------------- SuperBill Details Patient Name: Date of Service: DENT, PLANTZ 04/27/2021 Medical Record Number: [id number] Patient Account Number: [id number] Date of Birth/Sex: Treating RN: March 26, 1933 (87 y.o. Male) Shawn Stall Primary Care Provider: Raymon Mutton RA H Other Clinician: Referring Provider: Treating Provider/Extender: Truddie Hidden, DEBO RA H Weeks in Treatment: 0 Diagnosis Coding ICD-10 Codes Code Description L97.819 Non-pressure chronic ulcer of other part of right lower leg with unspecified severity I87.2 Venous insufficiency (chronic) (peripheral) I48.20 Chronic atrial fibrillation, unspecified Facility Procedures CPT4 Code: [id number] Description: 99213 - WOUND CARE VISIT-LEV 3 EST PT Modifier: Quantity: 1 Electronic Signature(s) Signed: 04/27/2021 6:22:56 PM By: Geralyn Corwin DO Entered By: Geralyn Corwin on 04/27/2021 18:22:21

## 2021-04-28 NOTE — Progress Notes (Signed)
Jesus, Fowler ([id number]) Visit Report for 04/27/2021 Allergy List Details Patient Name: Date of Service: Jesus, Fowler 04/27/2021 1:15 PM Medical Record Number: [id number] Patient Account Number: [id number] Date of Birth/Sex: Treating RN: [birth year] (88 y.o. Male) Antonieta Iba Primary Care Tuere Nwosu: Raymon Mutton RA H Other Clinician: Referring Hadlee Burback: Treating Star Resler/Extender: Truddie Hidden, DEBO RA H Weeks in Treatment: 0 Allergies Active Allergies No Known Allergies Allergy Notes Electronic Signature(s) Signed: 04/27/2021 5:13:18 PM By: Antonieta Iba Entered By: Antonieta Iba on 04/27/2021 13:20:43 -------------------------------------------------------------------------------- Arrival Information Details Patient Name: Date of Service: Jesus, Fowler 04/27/2021 1:15 PM Medical Record Number: [id number] Patient Account Number: [id number] Date of Birth/Sex: Treating RN: 1933/05/13 (87 y.o. Male) Antonieta Iba Primary Care Vonne Mcdanel: Raymon Mutton RA H Other Clinician: Referring Lashika Erker: Treating Grabiel Schmutz/Extender: Truddie Hidden, DEBO RA H Weeks in Treatment: 0 Visit Information Patient Arrived: Wheel Chair Arrival Time: 13:09 Accompanied By: Daughter/POA Transfer Assistance: Manual Patient Identification Verified: Yes Secondary Verification Process Completed: Yes Patient Requires Transmission-Based Precautions: No Patient Has Alerts: Yes Patient Alerts: Patient on Blood Thinner ABI: R=1.77 L=1.77 TBI: R=0.61 L=0.54 Electronic Signature(s) Signed: 04/27/2021 5:13:18 PM By: Antonieta Iba Entered By: Antonieta Iba on 04/27/2021 13:34:14 -------------------------------------------------------------------------------- Clinic Level of Care Assessment Details Patient Name: Date of Service: Jesus, Fowler 04/27/2021 1:15 PM Medical Record Number: [id number] Patient Account Number: [id number] Date of Birth/Sex: Treating RN: 1933/04/21 (87  y.o. Male) Shawn Stall Primary Care Arohi Salvatierra: Raymon Mutton RA H Other Clinician: Referring Barton Want: Treating Enio Hornback/Extender: Truddie Hidden, DEBO RA H Weeks in Treatment: 0 Clinic Level of Care Assessment Items TOOL 1 Quantity Score X- 1 0 Use when EandM and Procedure is performed on INITIAL visit ASSESSMENTS - Nursing Assessment / Reassessment X- 1 20 General Physical Exam (combine w/ comprehensive assessment (listed just below) when performed on new pt. evals) X- 1 25 Comprehensive Assessment (HX, ROS, Risk Assessments, Wounds Hx, etc.) ASSESSMENTS - Wound and Skin Assessment / Reassessment X- 1 10 Dermatologic / Skin Assessment (not related to wound area) ASSESSMENTS - Ostomy and/or Continence Assessment and Care []  - 0 Incontinence Assessment and Management []  - 0 Ostomy Care Assessment and Management (repouching, etc.) PROCESS - Coordination of Care X - Simple Patient / Family Education for ongoing care 1 15 []  - 0 Complex (extensive) Patient / Family Education for ongoing care X- 1 10 Staff obtains , Records, T Results / Process Orders est X- 1 10 Staff telephones HHA, Nursing Homes / Clarify orders / etc []  - 0 Routine Transfer to another Facility (non-emergent condition) []  - 0 Routine Hospital Admission (non-emergent condition) X- 1 15 New Admissions / / Ordering NPWT Apligraf, etc. , []  - 0 Emergency Hospital Admission (emergent condition) PROCESS - Special Needs []  - 0 Pediatric / Minor Patient Management []  - 0 Isolation Patient Management []  - 0 Hearing / Language / Visual special needs []  - 0 Assessment of Community assistance (transportation, D/C planning, etc.) []  - 0 Additional assistance / Altered mentation []  - 0 Support Surface(s) Assessment (bed, cushion, seat, etc.) INTERVENTIONS - Miscellaneous []  - 0 External ear exam []  - 0 Patient Transfer (multiple staff / / Similar  devices) []  - 0 Simple Staple / Suture removal (25 or less) []  - 0 Complex Staple / Suture removal (26 or more) []  - 0 Hypo/Hyperglycemic Management (do not check if billed separately) []  - 0 Ankle / Brachial Index (ABI) - do not check if billed separately Has the patient been seen at  the hospital within the last three years: Yes Total Score: 105 Level Of Care: New/Established - Level 3 Electronic Signature(s) Signed: 04/27/2021 6:31:57 PM By: Shawn Stall Entered By: Shawn Stall on 04/27/2021 15:36:00 -------------------------------------------------------------------------------- Encounter Discharge Information Details Patient Name: Date of Service: Jesus, Fowler 04/27/2021 1:15 PM Medical Record Number: [id number] Patient Account Number: [id number] Date of Birth/Sex: Treating RN: [birth year] (87 y.o. Male) Zenaida Deed Primary Care Johanny Segers: Raymon Mutton RA H Other Clinician: Referring Akia Desroches: Treating Ares Tegtmeyer/Extender: Truddie Hidden, DEBO RA H Weeks in Treatment: 0 Encounter Discharge Information Items Discharge Condition: Stable Ambulatory Status: Wheelchair Discharge Destination: Skilled Nursing Facility Telephoned: No Orders Sent: Yes Transportation: Other Accompanied By: daughter Schedule Follow-up Appointment: Yes Clinical Summary of Care: Patient Declined Notes facility transportation Electronic Signature(s) Signed: 04/27/2021 5:26:54 PM By: Zenaida Deed RN, BSN Entered By: Zenaida Deed on 04/27/2021 15:44:11 -------------------------------------------------------------------------------- Lower Extremity Assessment Details Patient Name: Date of Service: Jesus Fowler 04/27/2021 1:15 PM Medical Record Number: [id number] Patient Account Number: [id number] Date of Birth/Sex: Treating RN: 13-Sep-1933 (87 y.o. Male) Antonieta Iba Primary Care Rendy Lazard: Raymon Mutton RA H Other Clinician: Referring Daniyla Pfahler: Treating Teddy Rebstock/Extender:  Truddie Hidden, DEBO RA H Weeks in Treatment: 0 Edema Assessment Assessed: [Left: Yes] [Right: Yes] Edema: [Left: No] [Right: No] Calf Left: Right: Point of Measurement: 32 cm From Medial Instep 26.6 cm 26 cm Ankle Left: Right: Point of Measurement: 9 cm From Medial Instep 17 cm 18 cm Knee To Floor Left: Right: From Medial Instep 39 cm 39 cm Vascular Assessment Pulses: Dorsalis Pedis Palpable: [Left:Yes] [Right:Yes] Notes ABI's from Vascular R=1.77 L=1.77 TBI's from Vascular R=0.61 L=0.54 Electronic Signature(s) Signed: 04/27/2021 5:13:18 PM By: Antonieta Iba Entered By: Antonieta Iba on 04/27/2021 13:55:03 -------------------------------------------------------------------------------- Multi Wound Chart Details Patient Name: Date of Service: Jesus Fowler 04/27/2021 1:15 PM Medical Record Number: [id number] Patient Account Number: [id number] Date of Birth/Sex: Treating RN: 19-Feb-1933 (87 y.o. Male) Shawn Stall Primary Care Akiyah Eppolito: Raymon Mutton RA H Other Clinician: Referring Narciso Stoutenburg: Treating Elan Brainerd/Extender: Truddie Hidden, DEBO RA H Weeks in Treatment: 0 Vital Signs Height(in): Pulse(bpm): 78 Weight(lbs): 168 Blood Pressure(mmHg): 154/75 Body Mass Index(BMI): Temperature(F): 98 Respiratory Rate(breaths/min): 20 Photos: [N/A:N/A] Right, Posterior Ankle N/A N/A Wound Location: Blister N/A N/A Wounding Event: Venous Leg Ulcer N/A N/A Primary Etiology: Cataracts, Chronic sinus N/A N/A Comorbid History: problems/congestion, Sleep Apnea, Arrhythmia, Congestive Heart Failure, Hypertension, Osteoarthritis 03/22/2021 N/A N/A Date Acquired: 0 N/A N/A Weeks of Treatment: Open N/A N/A Wound Status: 1.5x2x0.1 N/A N/A Measurements L x W x D (cm) 2.356 N/A N/A A (cm) : rea 0.236 N/A N/A Volume (cm) : 0.00% N/A N/A % Reduction in Area: 0.00% N/A N/A % Reduction in Volume: Full Thickness Without Exposed N/A  N/A Classification: Support Structures Medium N/A N/A Exudate Amount: Serosanguineous N/A N/A Exudate Type: red, brown N/A N/A Exudate Color: Distinct, outline attached N/A N/A Wound Margin: Large (67-100%) N/A N/A Granulation Amount: Red N/A N/A Granulation Quality: None Present (0%) N/A N/A Necrotic Amount: Fat Layer (Subcutaneous Tissue): Yes N/A N/A Exposed Structures: Fascia: No Tendon: No Muscle: No Joint: No Bone: No None N/A N/A Epithelialization: Treatment Notes Wound #1 (Ankle) Wound Laterality: Right, Posterior Cleanser Soap and Water Discharge Instruction: May shower and wash wound with dial antibacterial soap and water prior to dressing change. Wound Cleanser Discharge Instruction: Cleanse the wound with wound cleanser prior to applying a clean dressing using gauze sponges, not tissue or cotton balls. Peri-Wound Care Sween Lotion (Moisturizing lotion) Discharge Instruction: Apply moisturizing  lotion to right leg as directed Topical Primary Dressing PolyMem Silver Non-Adhesive Dressing, 4.25x4.25 in Discharge Instruction: Apply to wound bed as instructed Secondary Dressing Woven Gauze Sponge, Non-Sterile 4x4 in Discharge Instruction: Apply over primary dressing as directed. Secured With Compression Wrap Kerlix Roll 4.5x3.1 (in/yd) Discharge Instruction: **Apply Kerlix and Coban compression as directed. Coban Self-Adherent Wrap 4x5 (in/yd) Discharge Instruction: Apply over Kerlix as directed. Compression Stockings Add-Ons Electronic Signature(s) Signed: 04/27/2021 6:22:56 PM By: Geralyn Corwin DO Signed: 04/27/2021 6:31:57 PM By: Shawn Stall Entered By: Geralyn Corwin on 04/27/2021 18:13:52 -------------------------------------------------------------------------------- Multi-Disciplinary Care Plan Details Patient Name: Date of Service: Jesus Fowler 04/27/2021 1:15 PM Medical Record Number: [id number] Patient Account Number:  [id number] Date of Birth/Sex: Treating RN: Jul 06, 1933 (87 y.o. Male) Shawn Stall Primary Care Umeka Wrench: Raymon Mutton RA H Other Clinician: Referring Makoto Sellitto: Treating Casimira Sutphin/Extender: Truddie Hidden, DEBO RA H Weeks in Treatment: 0 Active Inactive Abuse / Safety / Falls / Self Care Management Nursing Diagnoses: Potential for falls Potential for injury related to falls Goals: Patient will remain injury free related to falls Date Initiated: 04/27/2021 Target Resolution Date: 06/02/2021 Goal Status: Active Interventions: Provide education on fall prevention Notes: Orientation to the Wound Care Program Nursing Diagnoses: Knowledge deficit related to the wound healing center program Goals: Patient/caregiver will verbalize understanding of the Wound Healing Center Program Date Initiated: 04/27/2021 Target Resolution Date: 05/05/2021 Goal Status: Active Interventions: Provide education on orientation to the wound center Notes: Venous Leg Ulcer Nursing Diagnoses: Knowledge deficit related to disease process and management Potential for venous Insuffiency (use before diagnosis confirmed) Goals: Non-invasive venous studies are completed as ordered Date Initiated: 04/27/2021 Target Resolution Date: 04/27/2021 Goal Status: Active Interventions: Provide education on venous insufficiency Treatment Activities: Non-invasive vascular studies : 04/27/2021 T ordered outside of clinic : 04/27/2021 est Notes: Electronic Signature(s) Signed: 04/27/2021 6:31:57 PM By: Shawn Stall Entered By: Shawn Stall on 04/27/2021 14:42:30 -------------------------------------------------------------------------------- Pain Assessment Details Patient Name: Date of Service: Jesus, Fowler 04/27/2021 1:15 PM Medical Record Number: [id number] Patient Account Number: [id number] Date of Birth/Sex: Treating RN: May 31, 1933 (87 y.o. Male) Antonieta Iba Primary Care Taurean Ju: Raymon Mutton RA  H Other Clinician: Referring Catina Nuss: Treating Xianna Siverling/Extender: Truddie Hidden, DEBO RA H Weeks in Treatment: 0 Active Problems Location of Pain Severity and Description of Pain Patient Has Paino Yes Site Locations Pain Location: Pain Location: Pain in Ulcers With Dressing Change: Yes Duration of the Pain. Constant / Intermittento Intermittent Rate the pain. Current Pain Level: 4 Character of Pain Describe the Pain: Aching, Tender, Throbbing Pain Management and Medication Current Pain Management: Medication: Yes Cold Application: No Rest: Yes Massage: No Activity: No T.E.N.S.: No Heat Application: No Leg drop or elevation: No Is the Current Pain Management Adequate: Inadequate How does your wound impact your activities of daily livingo Sleep: Yes Bathing: No Appetite: No Relationship With Others: No Bladder Continence: No Emotions: No Bowel Continence: No Work: No Toileting: No Drive: No Dressing: No Hobbies: No Electronic Signature(s) Signed: 04/27/2021 5:13:18 PM By: Antonieta Iba Entered By: Antonieta Iba on 04/27/2021 14:01:49 -------------------------------------------------------------------------------- Patient/Caregiver Education Details Patient Name: Date of Service: Jesus Fowler 4/28/2022andnbsp1:15 PM Medical Record Number: [id number] Patient Account Number: [id number] Date of Birth/Gender: Treating RN: 1933-08-12 (87 y.o. Male) Shawn Stall Primary Care Physician: Raymon Mutton RA H Other Clinician: Referring Physician: Treating Physician/Extender: Truddie Hidden, DEBO RA H Weeks in Treatment: 0 Education Assessment Education Provided To: Patient and Caregiver daughter Education Topics Provided Venous: Handouts: Controlling Swelling with Compression Stockings  Methods: Explain/Verbal, Printed Responses: Reinforcements needed Welcome [facility]: o Handouts: [facility] o Methods: Explain/Verbal, Printed Responses: Reinforcements needed Electronic Signature(s) Signed: 04/27/2021 6:31:57 PM By: Shawn Stalleaton, Bobbi Entered By: Shawn Stalleaton, Bobbi on 04/27/2021 14:42:58 -------------------------------------------------------------------------------- Wound Assessment Details Patient Name: Date of Service: Jesus GainSCRUGGS, Jesus Fowler 04/27/2021 1:15 PM Medical Record Number: [id number] Patient Account Number: [id number] Date of Birth/Sex: Treating RN: 1933-02-21 (87 y.o. Male) Antonieta IbaBarnhart, Jodi Primary Care Corban Kistler: Raymon MuttonGREEN, DEBO RA H Other Clinician: Referring Firas Guardado: Treating Kyrsten Deleeuw/Extender: Truddie HiddenHoffman, Jessica GREEN, DEBO RA H Weeks in Treatment: 0 Wound Status Wound Number: 1 Primary Venous Leg Ulcer Etiology: Wound Location: Right, Posterior Ankle Wound Open Wounding Event: Blister Status: Date Acquired: 03/22/2021 Comorbid Cataracts, Chronic sinus problems/congestion, Sleep Apnea, Weeks Of Treatment: 0 History: Arrhythmia, Congestive Heart Failure, Hypertension, Osteoarthritis Clustered Wound: No Photos Wound Measurements Length: (cm) 1.5 Width: (cm) 2 Depth: (cm) 0.1 Area: (cm) 2.356 Volume: (cm) 0.236 % Reduction in Area: 0% % Reduction in Volume: 0% Epithelialization: None Tunneling: No Undermining: No Wound Description Classification: Full Thickness Without Exposed Support Structures Wound Margin: Distinct, outline attached Exudate Amount: Medium Exudate Type: Serosanguineous Exudate Color: red, brown Foul Odor After Cleansing: No Slough/Fibrino No Wound Bed Granulation Amount: Large (67-100%) Exposed Structure Granulation Quality: Red Fascia Exposed: No Necrotic Amount: None Present (0%) Fat Layer (Subcutaneous Tissue) Exposed: Yes Tendon Exposed: No Muscle Exposed: No Joint Exposed: No Bone Exposed: No Treatment Notes Wound #1 (Ankle) Wound Laterality: Right, Posterior Cleanser Soap and Water Discharge Instruction: May shower  and wash wound with dial antibacterial soap and water prior to dressing change. Wound Cleanser Discharge Instruction: Cleanse the wound with wound cleanser prior to applying a clean dressing using gauze sponges, not tissue or cotton balls. Peri-Wound Care Sween Lotion (Moisturizing lotion) Discharge Instruction: Apply moisturizing lotion to right leg as directed Topical Primary Dressing PolyMem Silver Non-Adhesive Dressing, 4.25x4.25 in Discharge Instruction: Apply to wound bed as instructed Secondary Dressing Woven Gauze Sponge, Non-Sterile 4x4 in Discharge Instruction: Apply over primary dressing as directed. Secured With Compression Wrap Kerlix Roll 4.5x3.1 (in/yd) Discharge Instruction: **Apply Kerlix and Coban compression as directed. Coban Self-Adherent Wrap 4x5 (in/yd) Discharge Instruction: Apply over Kerlix as directed. Compression Stockings Add-Ons Electronic Signature(s) Signed: 04/27/2021 5:13:18 PM By: Antonieta IbaBarnhart, Jodi Signed: 04/28/2021 4:35:52 PM By: Karl Itoawkins, Destiny Entered By: Karl Itoawkins, Destiny on 04/27/2021 16:49:34 -------------------------------------------------------------------------------- Vitals Details Patient Name: Date of Service: Jesus Fowler, Jesus Fowler 04/27/2021 1:15 PM Medical Record Number: [id number] Patient Account Number: [id number] Date of Birth/Sex: Treating RN: 1933-02-21 (87 y.o. Male) Antonieta IbaBarnhart, Jodi Primary Care Lowanda Cashaw: Raymon MuttonGREEN, DEBO RA H Other Clinician: Referring Jamayah Myszka: Treating Olman Yono/Extender: Truddie HiddenHoffman, Jessica GREEN, DEBO RA H Weeks in Treatment: 0 Vital Signs Time Taken: 13:30 Temperature (F): 98 Weight (lbs): 168 Pulse (bpm): 78 Source: Stated Respiratory Rate (breaths/min): 20 Blood Pressure (mmHg): 154/75 Reference Range: 80 - 120 mg / dl Airway Pulse Oximetry (%): 92 Electronic Signature(s) Signed: 04/27/2021 5:13:18 PM By: Antonieta IbaBarnhart, Jodi Entered By: Antonieta IbaBarnhart, Jodi on 04/27/2021 13:33:51

## 2021-05-04 ENCOUNTER — Encounter (HOSPITAL_BASED_OUTPATIENT_CLINIC_OR_DEPARTMENT_OTHER): Payer: Medicare Other | Attending: Internal Medicine | Admitting: Internal Medicine

## 2021-05-04 DIAGNOSIS — Z8249 Family history of ischemic heart disease and other diseases of the circulatory system: Secondary | ICD-10-CM | POA: Diagnosis not present

## 2021-05-04 DIAGNOSIS — I890 Lymphedema, not elsewhere classified: Secondary | ICD-10-CM | POA: Diagnosis not present

## 2021-05-04 DIAGNOSIS — I872 Venous insufficiency (chronic) (peripheral): Secondary | ICD-10-CM | POA: Insufficient documentation

## 2021-05-04 DIAGNOSIS — L97819 Non-pressure chronic ulcer of other part of right lower leg with unspecified severity: Secondary | ICD-10-CM | POA: Diagnosis not present

## 2021-05-04 DIAGNOSIS — I482 Chronic atrial fibrillation, unspecified: Secondary | ICD-10-CM | POA: Diagnosis not present

## 2021-05-04 DIAGNOSIS — I110 Hypertensive heart disease with heart failure: Secondary | ICD-10-CM | POA: Insufficient documentation

## 2021-05-04 DIAGNOSIS — I509 Heart failure, unspecified: Secondary | ICD-10-CM | POA: Diagnosis not present

## 2021-05-04 NOTE — Progress Notes (Signed)
[place], SHELLHAMMER ([id number]) Visit Report for 05/04/2021 Chief Complaint Document Details Patient Name: Date of Service: Jesus, Fowler 05/04/2021 2:30 PM Medical Record Number: [id number] Patient Account Number: [id number] Date of Birth/Sex: Treating RN: [birth year] (87 y.o. Tammy Sours Primary Care Provider: Raymon Mutton RA H Other Clinician: Referring Provider: Treating Provider/Extender: Truddie Hidden, DEBO RA H Weeks in Treatment: 1 Information Obtained from: Patient Chief Complaint Right lower extremity wound Electronic Signature(s) Signed: 05/04/2021 3:52:04 PM By: Geralyn Corwin DO Entered By: Geralyn Corwin on 05/04/2021 15:47:05 -------------------------------------------------------------------------------- Debridement Details Patient Name: Date of Service: Jesus Fowler 05/04/2021 2:30 PM Medical Record Number: [id number] Patient Account Number: [id number] Date of Birth/Sex: Treating RN: Aug 27, 1933 (87 y.o. Tammy Sours Primary Care Provider: Raymon Mutton RA H Other Clinician: Referring Provider: Treating Provider/Extender: Truddie Hidden, DEBO RA H Weeks in Treatment: 1 Debridement Performed for Assessment: Wound #1 Right,Posterior Ankle Performed By: Physician Geralyn Corwin, DO Debridement Type: Debridement Severity of Tissue Pre Debridement: Fat layer exposed Level of Consciousness (Pre-procedure): Awake and Alert Pre-procedure Verification/Time Out Yes - 15:12 Taken: Start Time: 15:13 Pain Control: Lidocaine 4% T opical Solution T Area Debrided (L x W): otal 1 (cm) x 1.4 (cm) = 1.4 (cm) Tissue and other material debrided: Viable, Non-Viable, Callus, Subcutaneous, Skin: Dermis , Skin: Epidermis, Fibrin/Exudate Level: Skin/Subcutaneous Tissue Debridement Description: Excisional Instrument: Curette Bleeding: Minimum Hemostasis Achieved: Pressure End Time: 15:17 Procedural Pain: 0 Post Procedural Pain: 2 Response to  Treatment: Procedure was tolerated well Level of Consciousness (Post- Awake and Alert procedure): Post Debridement Measurements of Total Wound Length: (cm) 1 Width: (cm) 1.4 Depth: (cm) 0.1 Volume: (cm) 0.11 Character of Wound/Ulcer Post Debridement: Improved Severity of Tissue Post Debridement: Fat layer exposed Post Procedure Diagnosis Same as Pre-procedure Electronic Signature(s) Signed: 05/04/2021 3:52:04 PM By: Geralyn Corwin DO Signed: 05/04/2021 6:21:36 PM By: Shawn Stall Entered By: Shawn Stall on 05/04/2021 15:17:28 -------------------------------------------------------------------------------- HPI Details Patient Name: Date of Service: Jesus Fowler, Jesus Fowler 05/04/2021 2:30 PM Medical Record Number: [id number] Patient Account Number: [id number] Date of Birth/Sex: Treating RN: 12-Mar-1933 (87 y.o. Tammy Sours Primary Care Provider: Raymon Mutton RA H Other Clinician: Referring Provider: Treating Provider/Extender: Truddie Hidden, DEBO RA H Weeks in Treatment: 1 History of Present Illness HPI Description: Admission 4/28 Mr. Nienhuis is an 87 year old male with a past medical history of chronic A. fib, obstructive sleep apnea on CPAP, chronic venous insufficiency that presents today for evaluation of his lower extremity swelling and right lower extremity wound. He was referred to our clinic by vein and vascular. He saw them on 03/27/2021 and was noted to have noncompressible arteries on arterial duplex. He had TBI's that showed 0.61 on the right and 0.54 on the left. He was thought to have adequate blood supply to his legs for wound healing. He presents today for evaluation. He has been wrapped in Kerlix and Coban at his facility. He denies any pain. 5/5; patient presents for 1 week follow-up. We have been using PolyMem silver under Kerlix Coban wrap for the past week. He brought his compression stockings today to be placed on the left leg. He has no issues or  complaints today. He denies signs of infection. Electronic Signature(s) Signed: 05/04/2021 3:52:04 PM By: Geralyn Corwin DO Entered By: Geralyn Corwin on 05/04/2021 15:47:44 -------------------------------------------------------------------------------- Physical Exam Details Patient Name: Date of Service: Jesus, Fowler 05/04/2021 2:30 PM Medical Record Number: [id number] Patient Account Number: [id number] Date of Birth/Sex: Treating RN: 07-09-1933 (87 y.o. M) Elesa Hacker, Yvonne Kendall  Primary Care Provider: Raymon Mutton RA H Other Clinician: Referring Provider: Treating Provider/Extender: Truddie Hidden, DEBO RA H Weeks in Treatment: 1 Constitutional respirations regular, non-labored and within target range for patient.. Cardiovascular 2+ dorsalis pedis/posterior tibialis pulses. Psychiatric pleasant and cooperative. Notes The right posterior ankle wound is limited to skin breakdown. There was some nonviable tissue present and this was debrided. There is excellent healthy granulation tissue present. There are no signs of infection. He has venous stasis changes bilaterally. Electronic Signature(s) Signed: 05/04/2021 3:52:04 PM By: Geralyn Corwin DO Entered By: Geralyn Corwin on 05/04/2021 15:48:31 -------------------------------------------------------------------------------- Physician Orders Details Patient Name: Date of Service: Jesus Fowler, Jesus Fowler 05/04/2021 2:30 PM Medical Record Number: [id number] Patient Account Number: [id number] Date of Birth/Sex: Treating RN: 1933/09/25 (87 y.o. Tammy Sours Primary Care Provider: Raymon Mutton RA H Other Clinician: Referring Provider: Treating Provider/Extender: Truddie Hidden, DEBO RA H Weeks in Treatment: 1 Verbal / Phone Orders: No Diagnosis Coding ICD-10 Coding Code Description L97.819 Non-pressure chronic ulcer of other part of right lower leg with unspecified severity I87.2 Venous insufficiency (chronic)  (peripheral) I48.20 Chronic atrial fibrillation, unspecified Follow-up Appointments ppointment in 1 week. - Dr. Mikey Bussing Return A Bathing/ Shower/ Hygiene Do not shower or bathe in tub. - cannot get compression wrap wet. Edema Control - Lymphedema / SCD / Other Elevate legs to the level of the heart or above for 30 minutes daily and/or when sitting, a frequency of: - throughout the day. Avoid standing for long periods of time. Exercise regularly Moisturize legs daily. - Facility to lotion left leg every night before bed. Compression stocking or Garment 10-20 mm/Hg pressure to: - facility to apply to left leg in the morning and remove at night. Additional Orders / Instructions Other: - Nursing Facility to leave compression wrap in place. Do not get wet. Wound Treatment Wound #1 - Ankle Wound Laterality: Right, Posterior Cleanser: Soap and Water 1 x Per Week Discharge Instructions: May shower and wash wound with dial antibacterial soap and water prior to dressing change. Cleanser: Wound Cleanser 1 x Per Week Discharge Instructions: Cleanse the wound with wound cleanser prior to applying a clean dressing using gauze sponges, not tissue or cotton balls. Peri-Wound Care: Triamcinolone 15 (g) 1 x Per Week Discharge Instructions: Use triamcinolone 15 (g) as directed Peri-Wound Care: Sween Lotion (Moisturizing lotion) 1 x Per Week Discharge Instructions: Apply moisturizing lotion to right leg as directed Prim Dressing: PolyMem Silver Non-Adhesive Dressing, 4.25x4.25 in 1 x Per Week ary Discharge Instructions: Apply to wound bed as instructed Secondary Dressing: Woven Gauze Sponge, Non-Sterile 4x4 in 1 x Per Week Discharge Instructions: Apply over primary dressing as directed. Compression Wrap: Kerlix Roll 4.5x3.1 (in/yd) 1 x Per Week Discharge Instructions: **Apply Kerlix and Coban compression as directed. Compression Wrap: Coban Self-Adherent Wrap 4x5 (in/yd) 1 x Per Week Discharge  Instructions: Apply over Kerlix as directed. Electronic Signature(s) Signed: 05/04/2021 3:52:04 PM By: Geralyn Corwin DO Entered By: Geralyn Corwin on 05/04/2021 15:48:49 -------------------------------------------------------------------------------- Problem List Details Patient Name: Date of Service: Jesus Fowler, Jesus Fowler 05/04/2021 2:30 PM Medical Record Number: [id number] Patient Account Number: [id number] Date of Birth/Sex: Treating RN: 1933/12/27 (87 y.o. Tammy Sours Primary Care Provider: Raymon Mutton RA H Other Clinician: Referring Provider: Treating Provider/Extender: Truddie Hidden, DEBO RA H Weeks in Treatment: 1 Active Problems ICD-10 Encounter Code Description Active Date MDM Diagnosis L97.819 Non-pressure chronic ulcer of other part of right lower leg with unspecified 04/27/2021 No Yes severity I87.2 Venous insufficiency (chronic) (peripheral)  04/27/2021 No Yes I48.20 Chronic atrial fibrillation, unspecified 04/27/2021 No Yes Inactive Problems Resolved Problems Electronic Signature(s) Signed: 05/04/2021 3:52:04 PM By: Geralyn CorwinHoffman, Rayetta Veith DO Entered By: Geralyn CorwinHoffman, Karlie Aung on 05/04/2021 15:46:36 -------------------------------------------------------------------------------- Progress Note Details Patient Name: Date of Service: Jesus FlattenSCRUGGS, Jesus Fowler 05/04/2021 2:30 PM Medical Record Number: [id number] Patient Account Number: [id number] Date of Birth/Sex: Treating RN: 1933-07-26 (87 y.o. Tammy SoursM) Deaton, Bobbi Primary Care Provider: Raymon MuttonGREEN, DEBO RA H Other Clinician: Referring Provider: Treating Provider/Extender: Truddie HiddenHoffman, Shaira Sova GREEN, DEBO RA H Weeks in Treatment: 1 Subjective Chief Complaint Information obtained from Patient Right lower extremity wound History of Present Illness (HPI) Admission 4/28 Mr. Jesus Fowler is an 87 year old male with a past medical history of chronic A. fib, obstructive sleep apnea on CPAP, chronic venous insufficiency that presents today for  evaluation of his lower extremity swelling and right lower extremity wound. He was referred to our clinic by vein and vascular. He saw them on 03/27/2021 and was noted to have noncompressible arteries on arterial duplex. He had TBI's that showed 0.61 on the right and 0.54 on the left. He was thought to have adequate blood supply to his legs for wound healing. He presents today for evaluation. He has been wrapped in Kerlix and Coban at his facility. He denies any pain. 5/5; patient presents for 1 week follow-up. We have been using PolyMem silver under Kerlix Coban wrap for the past week. He brought his compression stockings today to be placed on the left leg. He has no issues or complaints today. He denies signs of infection. Patient History Information obtained from Patient. Family History Cancer - Mother, Heart Disease - Mother, Hypertension - Mother, No family history of Diabetes, Hereditary Spherocytosis, Kidney Disease, Lung Disease, Seizures, Stroke, Thyroid Problems, Tuberculosis. Social History Former smoker, Marital Status - Separated, Alcohol Use - Rarely, Drug Use - No History, Caffeine Use - Daily - Tea. 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 Vitals Time Taken: 3:00 PM, Weight: 168 lbs, Temperature: 98.4 F, Pulse: 73 bpm, Respiratory Rate: 20 breaths/min, Blood Pressure: 138/85 mmHg. Cardiovascular 2+ dorsalis pedis/posterior tibialis pulses. Psychiatric pleasant and  cooperative. General Notes: The right posterior ankle wound is limited to skin breakdown. There was some nonviable tissue present and this was debrided. There is excellent healthy granulation tissue present. There are no signs of infection. He has venous stasis changes bilaterally. Integumentary (Hair, Skin) Wound #1 status is Open. Original cause of wound was Blister. The date acquired was: 03/22/2021. The wound has been in treatment 1 weeks. The wound is located on the Right,Posterior Ankle. The wound measures 1cm length x 1.4cm width x 0.1cm depth; 1.1cm^2 area and 0.11cm^3 volume. There is Fat Layer (Subcutaneous Tissue) exposed. There is no tunneling or undermining noted. There is a medium amount of serosanguineous drainage noted. The wound margin is distinct with the outline attached to the wound base. There is large (67-100%) red granulation within the wound bed. There is no necrotic tissue within the wound bed. Assessment Active Problems ICD-10 Non-pressure chronic ulcer of other part of right lower leg with unspecified severity Venous insufficiency (chronic) (peripheral) Chronic atrial fibrillation, unspecified Patient presents with improvement to his wound  size and appearance. No signs of infection. We will continue with PolyMem silver and Kerlix/Coban. I am hoping that this will be closed in the next 2 weeks. We will place compression stockings to the left leg. Instructions were given to the patient on how to use these. Procedures Wound #1 Pre-procedure diagnosis of Wound #1 is a Venous Leg Ulcer located on the Right,Posterior Ankle .Severity of Tissue Pre Debridement is: Fat layer exposed. There was a Excisional Skin/Subcutaneous Tissue Debridement with a total area of 1.4 sq cm performed by Geralyn Corwin, DO. With the following instrument(s): Curette to remove Viable and Non-Viable tissue/material. Material removed includes Callus, Subcutaneous Tissue, Skin: Dermis,  Skin: Epidermis, and Fibrin/Exudate after achieving pain control using Lidocaine 4% Topical Solution. A time out was conducted at 15:12, prior to the start of the procedure. A Minimum amount of bleeding was controlled with Pressure. The procedure was tolerated well with a pain level of 0 throughout and a pain level of 2 following the procedure. Post Debridement Measurements: 1cm length x 1.4cm width x 0.1cm depth; 0.11cm^3 volume. Character of Wound/Ulcer Post Debridement is improved. Severity of Tissue Post Debridement is: Fat layer exposed. Post procedure Diagnosis Wound #1: Same as Pre-Procedure Plan Follow-up Appointments: Return Appointment in 1 week. - Dr. Mikey Bussing Bathing/ Shower/ Hygiene: Do not shower or bathe in tub. - cannot get compression wrap wet. Edema Control - Lymphedema / SCD / Other: Elevate legs to the level of the heart or above for 30 minutes daily and/or when sitting, a frequency of: - throughout the day. Avoid standing for long periods of time. Exercise regularly Moisturize legs daily. - Facility to lotion left leg every night before bed. Compression stocking or Garment 10-20 mm/Hg pressure to: - facility to apply to left leg in the morning and remove at night. Additional Orders / Instructions: Other: - Nursing Facility to leave compression wrap in place. Do not get wet. WOUND #1: - Ankle Wound Laterality: Right, Posterior Cleanser: Soap and Water 1 x Per Week/ Discharge Instructions: May shower and wash wound with dial antibacterial soap and water prior to dressing change. Cleanser: Wound Cleanser 1 x Per Week/ Discharge Instructions: Cleanse the wound with wound cleanser prior to applying a clean dressing using gauze sponges, not tissue or cotton balls. Peri-Wound Care: Triamcinolone 15 (g) 1 x Per Week/ Discharge Instructions: Use triamcinolone 15 (g) as directed Peri-Wound Care: Sween Lotion (Moisturizing lotion) 1 x Per Week/ Discharge Instructions: Apply  moisturizing lotion to right leg as directed Prim Dressing: PolyMem Silver Non-Adhesive Dressing, 4.25x4.25 in 1 x Per Week/ ary Discharge Instructions: Apply to wound bed as instructed Secondary Dressing: Woven Gauze Sponge, Non-Sterile 4x4 in 1 x Per Week/ Discharge Instructions: Apply over primary dressing as directed. Com pression Wrap: Kerlix Roll 4.5x3.1 (in/yd) 1 x Per Week/ Discharge Instructions: **Apply Kerlix and Coban compression as directed. Com pression Wrap: Coban Self-Adherent Wrap 4x5 (in/yd) 1 x Per Week/ Discharge Instructions: Apply over Kerlix as directed. 1. PolyMem silver and Kerlix/Coban to the right lower extremity 2. Follow-up in 1 week 3. Compression stocking to the left leg Electronic Signature(s) Signed: 05/04/2021 3:52:04 PM By: Geralyn Corwin DO Entered By: Geralyn Corwin on 05/04/2021 15:49:58 -------------------------------------------------------------------------------- HxROS Details Patient Name: Date of Service: Jesus Fowler, Jesus Fowler 05/04/2021 2:30 PM Medical Record Number: [id number] Patient Account Number: [id number] Date of Birth/Sex: Treating RN: 07-31-1933 (87 y.o. 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 Cardiovascular Medical History: Positive for: Arrhythmia - A-Fib; Congestive Heart Failure; Hypertension Genitourinary Medical History: Past Medical History Notes: Prostate Disorder Musculoskeletal Medical History: Positive for: Osteoarthritis HBO Extended History Items Ear/Nose/Mouth/Throat: Eyes: Chronic  sinus Cataracts problems/congestion Immunizations Pneumococcal Vaccine: Received Pneumococcal Vaccination: Yes Implantable Devices None Family and Social History Cancer: Yes - Mother; Diabetes: No; Heart Disease: Yes - Mother; Hereditary Spherocytosis: No; Hypertension: Yes - Mother; Kidney Disease: No; Lung Disease: No; Seizures: No; Stroke: No; Thyroid Problems: No; Tuberculosis: No; Former smoker; Marital Status - Separated; Alcohol Use: Rarely; Drug Use: No History; Caffeine Use: Daily - T Financial Concerns: No; Food, Clothing or Shelter Needs: No; Support System Lacking: No; Transportation Concerns: ea; No Electronic Signature(s) Signed: 05/04/2021 3:52:04 PM By: Geralyn Corwin DO Signed: 05/04/2021 6:21:36 PM By: Shawn Stall Entered By: Geralyn Corwin on 05/04/2021 15:47:50 -------------------------------------------------------------------------------- SuperBill Details Patient Name: Date of Service: Jesus, Fowler 05/04/2021 Medical Record Number: [id number] Patient Account Number: [id number] Date of Birth/Sex: Treating RN: 01/24/1933 (87 y.o. Tammy Sours Primary Care Provider: Raymon Mutton RA H Other Clinician: Referring Provider: Treating Provider/Extender: Truddie Hidden, DEBO RA H Weeks in Treatment: 1 Diagnosis Coding ICD-10 Codes Code Description L97.819 Non-pressure chronic ulcer of other part of right lower leg with unspecified severity I87.2 Venous insufficiency (chronic) (peripheral) I48.20 Chronic atrial fibrillation, unspecified Facility Procedures CPT4 Code: [id number] Description: 11042 - DEB SUBQ TISSUE 20 SQ CM/< ICD-10 Diagnosis Description L97.819 Non-pressure chronic ulcer of other part of right lower leg with unspecified se Modifier: verity Quantity: 1 Physician Procedures : CPT4 Code Description Modifier [id number] 11042 - WC PHYS SUBQ TISS 20 SQ CM ICD-10 Diagnosis Description L97.819 Non-pressure chronic ulcer of other part of  right lower leg with unspecified severity Quantity: 1 Electronic Signature(s) Signed: 05/04/2021 3:52:04 PM By: Geralyn Corwin DO Entered By: Geralyn Corwin on 05/04/2021 15:50:44

## 2021-05-05 NOTE — Progress Notes (Signed)
Jesus Fowler, Jesus Fowler ([id number]) Visit Report for 05/04/2021 Arrival Information Details Patient Name: Date of Service: Jesus, Fowler [place] 05/04/2021 2:30 PM Medical Record Number: [id number] Patient Account Number: [id number] Date of Birth/Sex: Treating RN: [birth year] (87 y.o. Tammy Sours Primary Care Amerika Nourse: Raymon Mutton RA H Other Clinician: Referring Sweetie Giebler: Treating Tinnie Kunin/Extender: Truddie Hidden, DEBO RA H Weeks in Treatment: 1 Visit Information History Since Last Visit Added or deleted any medications: No Patient Arrived: Wheel Chair Any new allergies or adverse reactions: No Arrival Time: 14:59 Had a fall or experienced change in No Accompanied By: daughter activities of daily living that may affect Transfer Assistance: None risk of falls: Patient Identification Verified: Yes Signs or symptoms of abuse/neglect since last visito No Secondary Verification Process Completed: Yes Hospitalized since last visit: No Patient Requires Transmission-Based Precautions: No Implantable device outside of the clinic excluding No Patient Has Alerts: Yes cellular tissue based products placed in the center Patient Alerts: Patient on Blood Thinner since last visit: ABI: R=1.77 L=1.77 Has Dressing in Place as Prescribed: Yes TBI: R=0.61 L=0.54 Pain Present Now: No Electronic Signature(s) Signed: 05/04/2021 5:12:45 PM By: Karl Ito Entered By: Karl Ito on 05/04/2021 15:00:10 -------------------------------------------------------------------------------- Encounter Discharge Information Details Patient Name: Date of Service: Jesus, Fowler [place] 05/04/2021 2:30 PM Medical Record Number: [id number] Patient Account Number: [id number] Date of Birth/Sex: Treating RN: July 05, 1933 (87 y.o. Damaris Schooner Primary Care Mckaela Howley: Raymon Mutton RA H Other Clinician: Referring Marcellus Pulliam: Treating Farron Lafond/Extender: Truddie Hidden, DEBO RA H Weeks in Treatment:  1 Encounter Discharge Information Items Post Procedure Vitals Discharge Condition: Stable Temperature (F): 98.4 Ambulatory Status: Wheelchair Pulse (bpm): 73 Discharge Destination: Skilled Nursing Facility Respiratory Rate (breaths/min): 18 Telephoned: No Blood Pressure (mmHg): 138/85 Orders Sent: Yes Transportation: Private Auto Accompanied By: daughter Schedule Follow-up Appointment: Yes Clinical Summary of Care: Patient Declined Electronic Signature(s) Signed: 05/05/2021 5:11:03 PM By: Zenaida Deed RN, BSN Entered By: Zenaida Deed on 05/04/2021 17:23:19 -------------------------------------------------------------------------------- Lower Extremity Assessment Details Patient Name: Date of Service: [place], [place] 05/04/2021 2:30 PM Medical Record Number: [id number] Patient Account Number: [id number] Date of Birth/Sex: Treating RN: [birth year] (87 y.o. Tammy Sours Primary Care Sunset Joshi: Raymon Mutton RA H Other Clinician: Referring Lasandra Batley: Treating Wenceslao Loper/Extender: Truddie Hidden, DEBO RA H Weeks in Treatment: 1 Edema Assessment Assessed: [Left: No] [Right: Yes] Edema: [Left: No] [Right: No] Calf Left: Right: Point of Measurement: 32 cm From Medial Instep 26.6 cm 24 cm Ankle Left: Right: Point of Measurement: 9 cm From Medial Instep 17 cm 16 cm Vascular Assessment Pulses: Dorsalis Pedis Palpable: [Left:Yes] [Right:Yes] Electronic Signature(s) Signed: 05/04/2021 6:21:36 PM By: Shawn Stall Entered By: Shawn Stall on 05/04/2021 15:08:43 -------------------------------------------------------------------------------- Multi Wound Chart Details Patient Name: Date of Service: Jesus Fowler 05/04/2021 2:30 PM Medical Record Number: [id number] Patient Account Number: [id number] Date of Birth/Sex: Treating RN: 29-Dec-1933 (87 y.o. Tammy Sours Primary Care Mistie Adney: Raymon Mutton RA H Other Clinician: Referring Jenney Brester: Treating  Nyjae Hodge/Extender: Truddie Hidden, DEBO RA H Weeks in Treatment: 1 Vital Signs Height(in): Pulse(bpm): 73 Weight(lbs): 168 Blood Pressure(mmHg): 138/85 Body Mass Index(BMI): Temperature(F): 98.4 Respiratory Rate(breaths/min): 20 Photos: [1:No Photos Right, Posterior Ankle] [N/A:N/A N/A] Wound Location: [1:Blister] [N/A:N/A] Wounding Event: [1:Venous Leg Ulcer] [N/A:N/A] Primary Etiology: [1:Cataracts, Chronic sinus] [N/A:N/A] Comorbid History: [1:problems/congestion, Sleep Apnea, Arrhythmia, Congestive Heart Failure, Hypertension, Osteoarthritis 03/22/2021] [N/A:N/A] Date Acquired: [1:1] [N/A:N/A] Weeks of Treatment: [1:Open] [N/A:N/A] Wound Status: [1:1x1.4x0.1] [N/A:N/A] Measurements L x W x D (cm) [1:1.1] [N/A:N/A] A (cm) : rea [1:0.11] [N/A:N/A] Volume (  cm) : [1:53.30%] [N/A:N/A] % Reduction in Area: [1:53.40%] [N/A:N/A] % Reduction in Volume: [1:Full Thickness Without Exposed] [N/A:N/A] Classification: [1:Support Structures Medium] [N/A:N/A] Exudate A mount: [1:Serosanguineous] [N/A:N/A] Exudate Type: [1:red, brown] [N/A:N/A] Exudate Color: [1:Distinct, outline attached] [N/A:N/A] Wound Margin: [1:Large (67-100%)] [N/A:N/A] Granulation A mount: [1:Red] [N/A:N/A] Granulation Quality: [1:None Present (0%)] [N/A:N/A] Necrotic A mount: [1:Fat Layer (Subcutaneous Tissue): Yes N/A] Exposed Structures: [1:Fascia: No Tendon: No Muscle: No Joint: No Bone: No Large (67-100%)] [N/A:N/A] Epithelialization: [1:Debridement - Excisional] [N/A:N/A] Debridement: Pre-procedure Verification/Time Out 15:12 [N/A:N/A] Taken: [1:Lidocaine 4% Topical Solution] [N/A:N/A] Pain Control: [1:Callus, Subcutaneous] [N/A:N/A] Tissue Debrided: [1:Skin/Subcutaneous Tissue] [N/A:N/A] Level: [1:1.4] [N/A:N/A] Debridement A (sq cm): [1:rea Curette] [N/A:N/A] Instrument: [1:Minimum] [N/A:N/A] Bleeding: [1:Pressure] [N/A:N/A] Hemostasis A chieved: [1:0] [N/A:N/A] Procedural Pain: [1:2]  [N/A:N/A] Post Procedural Pain: [1:Procedure was tolerated well] [N/A:N/A] Debridement Treatment Response: [1:1x1.4x0.1] [N/A:N/A] Post Debridement Measurements L x W x D (cm) [1:0.11] [N/A:N/A] Post Debridement Volume: (cm) [1:Debridement] [N/A:N/A] Treatment Notes Electronic Signature(s) Signed: 05/04/2021 3:52:04 PM By: Geralyn Corwin DO Signed: 05/04/2021 6:21:36 PM By: Shawn Stall Entered By: Geralyn Corwin on 05/04/2021 15:46:42 -------------------------------------------------------------------------------- Multi-Disciplinary Care Plan Details Patient Name: Date of Service: [place], [place] 05/04/2021 2:30 PM Medical Record Number: [id number] Patient Account Number: [id number] Date of Birth/Sex: Treating RN: 1933-11-04 (87 y.o. Tammy Sours Primary Care Fender Herder: Raymon Mutton RA H Other Clinician: Referring Danine Hor: Treating Sunshine Mackowski/Extender: Truddie Hidden, DEBO RA H Weeks in Treatment: 1 Active Inactive Abuse / Safety / Falls / Self Care Management Nursing Diagnoses: Potential for falls Potential for injury related to falls Goals: Patient will remain injury free related to falls Date Initiated: 04/27/2021 Target Resolution Date: 06/02/2021 Goal Status: Active Interventions: Provide education on fall prevention Notes: Venous Leg Ulcer Nursing Diagnoses: Knowledge deficit related to disease process and management Potential for venous Insuffiency (use before diagnosis confirmed) Goals: Non-invasive venous studies are completed as ordered Date Initiated: 04/27/2021 Target Resolution Date: 04/27/2021 Goal Status: Active Interventions: Provide education on venous insufficiency Treatment Activities: Non-invasive vascular studies : 04/27/2021 T ordered outside of clinic : 04/27/2021 est Notes: Electronic Signature(s) Signed: 05/04/2021 6:21:36 PM By: Shawn Stall Entered By: Shawn Stall on 05/04/2021  15:09:11 -------------------------------------------------------------------------------- Pain Assessment Details Patient Name: Date of Service: DAMARE, SERAN[place] 05/04/2021 2:30 PM Medical Record Number: [id number] Patient Account Number: [id number] Date of Birth/Sex: Treating RN: 1933/09/17 (87 y.o. Tammy Sours Primary Care Ticia Virgo: Raymon Mutton RA H Other Clinician: Referring Fina Heizer: Treating Ceniya Fowers/Extender: Truddie Hidden, DEBO RA H Weeks in Treatment: 1 Active Problems Location of Pain Severity and Description of Pain Patient Has Paino No Site Locations Pain Management and Medication Current Pain Management: Electronic Signature(s) Signed: 05/04/2021 5:12:45 PM By: Karl Ito Signed: 05/04/2021 6:21:36 PM By: Shawn Stall Entered By: Karl Ito on 05/04/2021 15:00:34 -------------------------------------------------------------------------------- Patient/Caregiver Education Details Patient Name: Date of Service: Jesus Fowler 5/5/2022andnbsp2:30 PM Medical Record Number: [id number] Patient Account Number: [id number] Date of Birth/Gender: Treating RN: May 01, 1933 (87 y.o. Tammy Sours Primary Care Physician: Raymon Mutton RA H Other Clinician: Referring Physician: Treating Physician/Extender: Truddie Hidden, DEBO RA H Weeks in Treatment: 1 Education Assessment Education Provided To: Patient Education Topics Provided Venous: Handouts: Managing Venous Disease and Related Ulcers Methods: Explain/Verbal Responses: Reinforcements needed Electronic Signature(s) Signed: 05/04/2021 6:21:36 PM By: Shawn Stall Entered By: Shawn Stall on 05/04/2021 15:09:22 -------------------------------------------------------------------------------- Wound Assessment Details Patient Name: Date of Service: VENNIE, WAYMIRE 05/04/2021 2:30 PM Medical Record Number: [id number] Patient Account Number: [id number] Date of Birth/Sex: Treating  RN: 10-Jan-1933 (  87 y.o. Tammy Sours Primary Care Amaiah Cristiano: Raymon Mutton RA H Other Clinician: Referring Maisen Klingler: Treating Marcelino Campos/Extender: Truddie Hidden, DEBO RA H Weeks in Treatment: 1 Wound Status Wound Number: 1 Primary Venous Leg Ulcer Etiology: Wound Location: Right, Posterior Ankle Wound Open Wounding Event: Blister Status: Date Acquired: 03/22/2021 Comorbid Cataracts, Chronic sinus problems/congestion, Sleep Apnea, Weeks Of Treatment: 1 History: Arrhythmia, Congestive Heart Failure, Hypertension, Osteoarthritis Clustered Wound: No Photos Wound Measurements Length: (cm) 1 Width: (cm) 1.4 Depth: (cm) 0.1 Area: (cm) 1.1 Volume: (cm) 0.11 % Reduction in Area: 53.3% % Reduction in Volume: 53.4% Epithelialization: Large (67-100%) Tunneling: No Undermining: No Wound Description Classification: Full Thickness Without Exposed Support Structures Wound Margin: Distinct, outline attached Exudate Amount: Medium Exudate Type: Serosanguineous Exudate Color: red, brown Foul Odor After Cleansing: No Slough/Fibrino No Wound Bed Granulation Amount: Large (67-100%) Exposed Structure Granulation Quality: Red Fascia Exposed: No Necrotic Amount: None Present (0%) Fat Layer (Subcutaneous Tissue) Exposed: Yes Tendon Exposed: No Muscle Exposed: No Joint Exposed: No Bone Exposed: No Treatment Notes Wound #1 (Ankle) Wound Laterality: Right, Posterior Cleanser Wound Cleanser Discharge Instruction: Cleanse the wound with wound cleanser prior to applying a clean dressing using gauze sponges, not tissue or cotton balls. Soap and Water Discharge Instruction: May shower and wash wound with dial antibacterial soap and water prior to dressing change. Peri-Wound Care Triamcinolone 15 (g) Discharge Instruction: Use triamcinolone 15 (g) as directed Sween Lotion (Moisturizing lotion) Discharge Instruction: Apply moisturizing lotion to right leg as  directed Topical Primary Dressing PolyMem Silver Non-Adhesive Dressing, 4.25x4.25 in Discharge Instruction: Apply to wound bed as instructed Secondary Dressing Woven Gauze Sponge, Non-Sterile 4x4 in Discharge Instruction: Apply over primary dressing as directed. Secured With Compression Wrap Kerlix Roll 4.5x3.1 (in/yd) Discharge Instruction: **Apply Kerlix and Coban compression as directed. Coban Self-Adherent Wrap 4x5 (in/yd) Discharge Instruction: Apply over Kerlix as directed. Compression Stockings Add-Ons Electronic Signature(s) Signed: 05/05/2021 4:45:41 PM By: Karl Ito Signed: 05/05/2021 5:05:26 PM By: Shawn Stall Previous Signature: 05/04/2021 6:21:36 PM Version By: Shawn Stall Entered By: Karl Ito on 05/05/2021 16:38:57 -------------------------------------------------------------------------------- Vitals Details Patient Name: Date of Service: [place], [place] URICE 05/04/2021 2:30 PM Medical Record Number: [id number] Patient Account Number: [id number] Date of Birth/Sex: Treating RN: 10-Jul-1933 (87 y.o. Harlon Flor, Millard.Loa Primary Care Shanika Levings: Raymon Mutton RA H Other Clinician: Referring Zaniya Mcaulay: Treating Aleshka Corney/Extender: Truddie Hidden, DEBO RA H Weeks in Treatment: 1 Vital Signs Time Taken: 15:00 Temperature (F): 98.4 Weight (lbs): 168 Pulse (bpm): 73 Respiratory Rate (breaths/min): 20 Blood Pressure (mmHg): 138/85 Reference Range: 80 - 120 mg / dl Electronic Signature(s) Signed: 05/04/2021 5:12:45 PM By: Karl Ito Entered By: Karl Ito on 05/04/2021 15:00:28

## 2021-05-10 ENCOUNTER — Encounter: Payer: Self-pay | Admitting: Adult Health

## 2021-05-10 ENCOUNTER — Non-Acute Institutional Stay (SKILLED_NURSING_FACILITY): Payer: Medicare Other | Admitting: Adult Health

## 2021-05-10 DIAGNOSIS — Z9989 Dependence on other enabling machines and devices: Secondary | ICD-10-CM | POA: Diagnosis not present

## 2021-05-10 DIAGNOSIS — I482 Chronic atrial fibrillation, unspecified: Secondary | ICD-10-CM

## 2021-05-10 DIAGNOSIS — G4733 Obstructive sleep apnea (adult) (pediatric): Secondary | ICD-10-CM | POA: Diagnosis not present

## 2021-05-10 DIAGNOSIS — I5033 Acute on chronic diastolic (congestive) heart failure: Secondary | ICD-10-CM

## 2021-05-10 NOTE — Progress Notes (Signed)
Location:  [facility] Room Number: 139-W Place of Service:  SNF (31)   CODE STATUS: DNR  No Known Allergies  Chief Complaint  Patient presents with  . Medical Management of Chronic Issues           Atrial fibrillation chronic:      Acute on chronic heart failure with preserved ejection fracture diastolic dysfunction:   OSA on CPAP      HPI:  He is a 87 year old long term resident of this facility being seen for the management of his chronic illnesses:  Atrial fibrillation chronic:      Acute on chronic heart failure with preserved ejection fracture diastolic dysfunction:   OSA on CPAP . There are no reports of uncontrolled pain; no changes in appetite; no reports of anxiety.   Past Medical History:  Diagnosis Date  . A-fib (HCC)   . Cellulitis   . CHF (congestive heart failure) (HCC)   . Hemorrhoid   . Hypertension   . Prostate disorder     Past Surgical History:  Procedure Laterality Date  . BIOPSY  03/17/2020   Procedure: BIOPSY;  Surgeon: Corbin Adeourk, Robert M, MD;  Location: [facility];  Service: Endoscopy;;  gastric rectal  . ESOPHAGEAL DILATION N/A 03/17/2020   Procedure: ESOPHAGEAL DILATION;  Surgeon: Corbin Adeourk, Robert M, MD;  Location: [facility];  Service: Endoscopy;  Laterality: N/A;  . ESOPHAGOGASTRODUODENOSCOPY (EGD) WITH PROPOFOL N/A 03/17/2020   Procedure: ESOPHAGOGASTRODUODENOSCOPY (EGD) WITH PROPOFOL;  Surgeon: Corbin Adeourk, Robert M, MD;  Location: [facility];  Service: Endoscopy;  Laterality: N/A;  . FLEXIBLE SIGMOIDOSCOPY N/A 03/17/2020   Procedure: FLEXIBLE SIGMOIDOSCOPY WITH PROPOFOL;  Surgeon: Corbin Adeourk, Robert M, MD;  Location: [facility];  Service: Endoscopy;  Laterality: N/A;  . HEMORRHOID SURGERY     patient reports several hemorrhoid surgeries in the past    Social History   Socioeconomic History  . Marital status: Legally Separated    Spouse name: Not on file  . Number of children: Not on file  . Years of education: Not on file   . Highest education level: Not on file  Occupational History  . Occupation: retired   Tobacco Use  . Smoking status: Former Games developermoker  . Smokeless tobacco: Never Used  Vaping Use  . Vaping Use: Never used  Substance and Sexual Activity  . Alcohol use: Yes    Comment: "very little"  . Drug use: Never  . Sexual activity: Not Currently  Other Topics Concern  . Not on file  Social History Narrative   On his third marriage x 18 years. Has 3 children and several grandchildren. His son lives in [place]  and is primary contact. 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 .  Former smoker.    Long term resident of SNF       Social Determinants of Health   Financial Resource Strain: Not on file  Food Insecurity: Not on file  Transportation Needs: Not on file  Physical Activity: Not on file  Stress: Not on file  Social Connections: Not on file  Intimate Partner Violence: Not on file   Family History  Problem Relation Age of Onset  . Heart disease Mother   . Colon cancer Neg Hx       VITAL SIGNS BP 130/76   Pulse 62   Temp (!) 97.5 F (36.4 C)   Ht 5\' 5"  (1.651 m)   Wt 169  lb 3.2 oz (76.7 kg)   SpO2 95%   BMI 28.16 kg/m   Outpatient Encounter Medications as of 05/10/2021  Medication Sig  . acetaminophen (TYLENOL) 325 MG tablet Take 2 tablets (650 mg total) by mouth every 6 (six) hours as needed for mild pain, fever or headache (or Fever >/= 101).  Marland Kitchen albuterol (VENTOLIN HFA) 108 (90 Base) MCG/ACT inhaler Inhale 2 puffs into the lungs every 6 (six) hours as needed for wheezing or shortness of breath.  Marland Kitchen apixaban (ELIQUIS) 5 MG TABS tablet Take 1 tablet (5 mg total) by mouth 2 (two) times daily.  . Ascorbic Acid (VITAMIN C) 1000 MG tablet Take 1,000 mg by mouth daily.  [name] [place]-Castor Oil (VENELEX) OINT Apply 1 application topically in the morning, at noon, and at bedtime.  . carboxymethylcellul-glycerin (OPTIVE) 0.5-0.9 % ophthalmic solution Place 1 drop  into both eyes 4 (four) times daily. For dry eyes  . gabapentin (NEURONTIN) 100 MG capsule Take 1 capsule (100 mg total) by mouth at bedtime.  . metoprolol succinate (TOPROL-XL) 25 MG 24 hr tablet Take 0.5 tablets (12.5 mg total) by mouth daily.  . Miconazole Nitrate 2 % POWD 1 application by Does not apply route as needed. Special Instructions: every shift prn redness in skin folds, As Needed; PRN 1, PRN 2, PRN 3  . NON FORMULARY BiPap at setting of 20/16 while sleeping. Twice A Day  . NON FORMULARY Diet: _____ Regular, __x____ NAS, _______Consistent Carbohydrate, _______NPO _____Other  . OXYGEN Inhale 2 L into the lungs continuous.   . pantoprazole (PROTONIX) 40 MG tablet Take 1 tablet (40 mg total) by mouth daily.  . polyethylene glycol (MIRALAX / GLYCOLAX) 17 g packet Take 17 g by mouth daily.  . sertraline (ZOLOFT) 50 MG tablet Take 50 mg by mouth daily.  . Skin Protectants, Misc. (EUCERIN) cream Apply 1 application topically. Once a week on Wednesday. Special Instructions: Bilateral lower extremities- Clean legs with soap and water. Pat dry. Apply Eucerin cream to bilateral legs, apply kerlix, then apply coban wrap from toes to knees  . sodium chloride (OCEAN) 0.65 % SOLN nasal spray Place 2 sprays into both nostrils every 6 (six) hours as needed (For Dry Stuffy Nose).  . tamsulosin (FLOMAX) 0.4 MG CAPS capsule Take 1 capsule (0.4 mg total) by mouth daily after supper.   No facility-administered encounter medications on file as of 05/10/2021.     SIGNIFICANT DIAGNOSTIC EXAMS   PREVIOUS  05-27-20: chest x-ray:  1. No acute abnormality. 2. Stable cardiomegaly and mild chronic bronchitic changes. 3. Stable elevated right hemidiaphragm  05-27-20: ct of head: Mild atrophy. No acute abnormality   05-27-20: MRI of brain:  No acute abnormality Motion degraded study Atrophy with mild chronic microvascular ischemic change in the white matter.  05-27-20: MRI of lumbar spine:  1. Image  quality degraded by moderate motion. 2. Moderate levoscoliosis lumbar spine. Multilevel degenerative changes throughout the lumbar spine with spinal and foraminal stenosis as described above. 3. Moderate spinal stenosis L2-3 with moderate subarticular and foraminal stenosis right greater than left 4. 7 mm anterolisthesis L5-S1 with possible pars defect on the left. Marked left foraminal encroachment with impingement left L5 nerveroot. 5. These results will be called to the ordering clinician or representative by the Radiologist Assistant, and communication documented in the PACS or [employer].   04-03-21: right hip x-ray: No acute bone abnormality. A follow-up study is recommended if patient's pain persists.  NO NEW EXAMS  LABS REVIEWED PREVIOUS  05-27-20: wbc 10.3; hgb 14.1; hct 44.3; mcv 102.1 plt 199; glucose 99 bun 22; creat 0.8; k+ 3.7; na++ 140; ca 9.6 liver normal albumin 3.2 BNP 281; tsh 0.465; mag 2.0; urine culture no growth 05-30-20: wbc 13.2; hgb 13.0; hct 40.5; mcv 102.0 plt 197  07-28-20: vit D 93.39 11-10-20: wbc 7.3; hgb 13.7; hct 44.4; mcv 100.9 plt 156; glucose 75; bun 13; creat 0.73; k+ 3.8; na++ 149; ca 9.2 liver normal albumin 3.1 chol 83; ldl 40; trig 38; hdl 35; tsh 1.020 mag 1.9; vit D 52.71 01-24-21: wbc 8.4; hgb 15.4; hct 48.8; mcv 98.6 plt 174; glucose 91; bun 12; creat 0.64; k+ 4.0; na++ 142; ca 9.4 GFR>60  03-07-21: wbc 9.5; hgb 16.3; hct 51.8; mcv 99.2 plt 179 ;glucose 80; bun 15; creat 0.75; k+ 3.7; na++ 144; ca 9.4; GFR>60 03-09-21: urine culture no growth  NO NEW LABS.   Review of Systems  Constitutional: Negative for malaise/fatigue.  Respiratory: Negative for cough and shortness of breath.   Cardiovascular: Negative for chest pain, palpitations and leg swelling.  Gastrointestinal: Negative for abdominal pain, constipation and heartburn.  Musculoskeletal: Negative for back pain, joint pain and myalgias.  Skin: Negative.   Neurological: Negative for  dizziness.  Psychiatric/Behavioral: The patient is not nervous/anxious.    Physical Exam Constitutional:      General: He is not in acute distress.    Appearance: He is well-developed. He is not diaphoretic.  Neck:     Thyroid: No thyromegaly.  Cardiovascular:     Rate and Rhythm: Normal rate and regular rhythm.     Heart sounds: Normal heart sounds.  Pulmonary:     Effort: Pulmonary effort is normal. No respiratory distress.     Breath sounds: Normal breath sounds.  Abdominal:     General: Bowel sounds are normal. There is no distension.     Palpations: Abdomen is soft.     Tenderness: There is no abdominal tenderness.  Musculoskeletal:        General: Normal range of motion.  Lymphadenopathy:     Cervical: No cervical adenopathy.  Skin:    General: Skin is warm and dry.     Comments: Bilateral lower extremities discolored Significant hematoma from right hip  Neurological:     Mental Status: He is alert and oriented to person, place, and time.  Psychiatric:        Mood and Affect: Mood normal.      ASSESSMENT/ PLAN:   TODAY  1. Atrial fibrillation chronic: is stable will continue toprol xl 12.5 mg daily for rate control; and eliquis 5 mg twice daily   2. Acute on chronic heart failure with preserved ejection fracture diastolic dysfunction: is compensated will continue topreol xl 12.5 mg daily  3. OSA is on CPAP is stable will continue cpap nightly     PREVIOUS  4. GERD without esophagitis: is stable will continue protonix 40 mg daily   5. Chronic constipation: is stable will continue miralax twice daily and senna s 2 tabs daily   6. Chronic venous insuffiencey: is without change; bilateral legs in wraps  7. Protein calorie malnutrition; severe; albumin 3.1 will continue supplements as directed.   8. BPH without urinary obstruction: is stable will continue flomax 0.4 mg daily   9. Dyslipidemia: is stable LDL 40; trig 38; is off statin and fish oil will  monitor  10. Lumbar radiculopathy L5/S1; is stable will continue gabapentin 100 mg nightly  11. Venous stasis dermatis bilateral lower extremities:  Is without change legs are wrapped.     Synthia Innocent NP [facility]  Contact [phone] Monday through Friday 8am- 5pm  After hours call [phone]

## 2021-05-11 ENCOUNTER — Encounter (HOSPITAL_BASED_OUTPATIENT_CLINIC_OR_DEPARTMENT_OTHER): Payer: Medicare Other | Admitting: Internal Medicine

## 2021-05-18 ENCOUNTER — Encounter (HOSPITAL_BASED_OUTPATIENT_CLINIC_OR_DEPARTMENT_OTHER): Payer: Medicare Other | Admitting: Internal Medicine

## 2021-05-18 DIAGNOSIS — I482 Chronic atrial fibrillation, unspecified: Secondary | ICD-10-CM

## 2021-05-18 DIAGNOSIS — I509 Heart failure, unspecified: Secondary | ICD-10-CM | POA: Diagnosis not present

## 2021-05-18 DIAGNOSIS — I890 Lymphedema, not elsewhere classified: Secondary | ICD-10-CM | POA: Diagnosis not present

## 2021-05-18 DIAGNOSIS — Z8249 Family history of ischemic heart disease and other diseases of the circulatory system: Secondary | ICD-10-CM | POA: Diagnosis not present

## 2021-05-18 DIAGNOSIS — L97819 Non-pressure chronic ulcer of other part of right lower leg with unspecified severity: Secondary | ICD-10-CM | POA: Diagnosis not present

## 2021-05-18 DIAGNOSIS — I110 Hypertensive heart disease with heart failure: Secondary | ICD-10-CM | POA: Diagnosis not present

## 2021-05-18 DIAGNOSIS — I872 Venous insufficiency (chronic) (peripheral): Secondary | ICD-10-CM

## 2021-05-18 NOTE — Progress Notes (Signed)
SEYMORE, BRODOWSKI ([id number]) Visit Report for 05/18/2021 Chief Complaint Document Details Patient Name: Date of Service: Jesus Fowler, Jesus Fowler 05/18/2021 1:30 PM Medical Record Number: [id number] Patient Account Number: [id number] Date of Birth/Sex: Treating RN: 1933/09/12 (87 y.o. Tammy Sours Primary Care Provider: Synthia Innocent Other Clinician: Referring Provider: Treating Provider/Extender: Marcelyn Ditty in Treatment: 3 Information Obtained from: Patient Chief Complaint Right lower extremity wound Electronic Signature(s) Signed: 05/18/2021 2:57:39 PM By: Geralyn Corwin DO Entered By: Geralyn Corwin on 05/18/2021 14:50:19 -------------------------------------------------------------------------------- HPI Details Patient Name: Date of Service: Jesus Fowler, Jesus Fowler 05/18/2021 1:30 PM Medical Record Number: [id number] Patient Account Number: [id number] Date of Birth/Sex: Treating RN: [birth year] (87 y.o. Tammy Sours Primary Care Provider: Synthia Innocent Other Clinician: Referring Provider: Treating Provider/Extender: Marcelyn Ditty in Treatment: 3 History of Present Illness HPI Description: Admission 4/28 Mr. Rappaport is an 87 year old male with a past medical history of chronic A. fib, obstructive sleep apnea on CPAP, chronic venous insufficiency that presents today for evaluation of his lower extremity swelling and right lower extremity wound. He was referred to our clinic by vein and vascular. He saw them on 03/27/2021 and was noted to have noncompressible arteries on arterial duplex. He had TBI's that showed 0.61 on the right and 0.54 on the left. He was thought to have adequate blood supply to his legs for wound healing. He presents today for evaluation. He has been wrapped in Kerlix and Coban at his facility. He denies any pain. 5/5; patient presents for 1 week follow-up. We have been using PolyMem silver under Kerlix Coban  wrap for the past week. He brought his compression stockings today to be placed on the left leg. He has no issues or complaints today. He denies signs of infection. 5/19; patient presents for 2-week follow-up. He has been using PolyMem silver under Kerlix/Coban wrap. He has no issues or complaints today. He does not have his compression stocking today but has it at his facility. Electronic Signature(s) Signed: 05/18/2021 2:57:39 PM By: Geralyn Corwin DO Entered By: Geralyn Corwin on 05/18/2021 14:53:13 -------------------------------------------------------------------------------- Physical Exam Details Patient Name: Date of Service: Jesus Fowler, Jesus Fowler 05/18/2021 1:30 PM Medical Record Number: [id number] Patient Account Number: [id number] Date of Birth/Sex: Treating RN: 07/14/1933 (87 y.o. Tammy Sours Primary Care Provider: Synthia Innocent Other Clinician: Referring Provider: Treating Provider/Extender: Marcelyn Ditty in Treatment: 3 Constitutional respirations regular, non-labored and within target range for patient.Marland Kitchen Psychiatric pleasant and cooperative. Notes Right posterior ankle wound has epithelialized. No signs of infection. Venous stasis dermatitis changes bilaterally. Electronic Signature(s) Signed: 05/18/2021 2:57:39 PM By: Geralyn Corwin DO Entered By: Geralyn Corwin on 05/18/2021 14:54:15 -------------------------------------------------------------------------------- Physician Orders Details Patient Name: Date of Service: Jesus Fowler, Jesus Fowler 05/18/2021 1:30 PM Medical Record Number: [id number] Patient Account Number: [id number] Date of Birth/Sex: Treating RN: 23-Sep-1933 (87 y.o. Tammy Sours Primary Care Provider: Synthia Innocent Other Clinician: Referring Provider: Treating Provider/Extender: Marcelyn Ditty in Treatment: 3 Verbal / Phone Orders: No Diagnosis Coding ICD-10 Coding Code Description L97.819  Non-pressure chronic ulcer of other part of right lower leg with unspecified severity I87.2 Venous insufficiency (chronic) (peripheral) I48.20 Chronic atrial fibrillation, unspecified Discharge From [facility] Discharge from Wound Care Center - call if any future wound care needs. Edema Control - Lymphedema / SCD / Other Elevate legs to the level of the heart or above for 30 minutes daily and/or when sitting, a frequency of: - throughout the day. Avoid standing for long  periods of time. Exercise regularly Moisturize legs daily. - Facility to lotion both legs every night before bed. Compression stocking or Garment 10-20 mm/Hg pressure to: - facility to apply to both legs in the morning and remove at night. Electronic Signature(s) Signed: 05/18/2021 2:57:39 PM By: Geralyn Corwin DO Entered By: Geralyn Corwin on 05/18/2021 14:54:42 -------------------------------------------------------------------------------- Problem List Details Patient Name: Date of Service: Jesus Fowler, Jesus Fowler 05/18/2021 1:30 PM Medical Record Number: [id number] Patient Account Number: [id number] Date of Birth/Sex: Treating RN: 1933/10/03 (87 y.o. Tammy Sours Primary Care Provider: Synthia Innocent Other Clinician: Referring Provider: Treating Provider/Extender: Marcelyn Ditty in Treatment: 3 Active Problems ICD-10 Encounter Code Description Active Date MDM Diagnosis L97.819 Non-pressure chronic ulcer of other part of right lower leg with unspecified 04/27/2021 No Yes severity I87.2 Venous insufficiency (chronic) (peripheral) 04/27/2021 No Yes I48.20 Chronic atrial fibrillation, unspecified 04/27/2021 No Yes Inactive Problems Resolved Problems Electronic Signature(s) Signed: 05/18/2021 2:57:39 PM By: Geralyn Corwin DO Entered By: Geralyn Corwin on 05/18/2021 14:50:09 -------------------------------------------------------------------------------- Progress Note Details Patient  Name: Date of Service: Jesus Fowler, Jesus Fowler 05/18/2021 1:30 PM Medical Record Number: [id number] Patient Account Number: [id number] Date of Birth/Sex: Treating RN: January 15, 1933 (87 y.o. Tammy Sours Primary Care Provider: Synthia Innocent Other Clinician: Referring Provider: Treating Provider/Extender: Marcelyn Ditty in Treatment: 3 Subjective Chief Complaint Information obtained from Patient Right lower extremity wound History of Present Illness (HPI) Admission 4/28 Mr. Roback is an 87 year old male with a past medical history of chronic A. fib, obstructive sleep apnea on CPAP, chronic venous insufficiency that presents today for evaluation of his lower extremity swelling and right lower extremity wound. He was referred to our clinic by vein and vascular. He saw them on 03/27/2021 and was noted to have noncompressible arteries on arterial duplex. He had TBI's that showed 0.61 on the right and 0.54 on the left. He was thought to have adequate blood supply to his legs for wound healing. He presents today for evaluation. He has been wrapped in Kerlix and Coban at his facility. He denies any pain. 5/5; patient presents for 1 week follow-up. We have been using PolyMem silver under Kerlix Coban wrap for the past week. He brought his compression stockings today to be placed on the left leg. He has no issues or complaints today. He denies signs of infection. 5/19; patient presents for 2-week follow-up. He has been using PolyMem silver under Kerlix/Coban wrap. He has no issues or complaints today. He does not have his compression stocking today but has it at his facility. Patient History Information obtained from Patient. Family History Cancer - Mother, Heart Disease - Mother, Hypertension - Mother, No family history of Diabetes, Hereditary Spherocytosis, Kidney Disease, Lung Disease, Seizures, Stroke, Thyroid Problems, Tuberculosis. Social History Former smoker, Marital  Status - Separated, Alcohol Use - Rarely, Drug Use - No History, Caffeine Use - Daily - Tea. 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 Vitals Time Taken: 1:54 PM, Weight: 168 lbs, Temperature: 98.1 F, Pulse: 76 bpm, Respiratory Rate: 16 breaths/min, Blood Pressure: 142/90 mmHg. Psychiatric pleasant and cooperative. General Notes: Right posterior ankle wound has epithelialized. No signs of  infection. Venous stasis dermatitis changes bilaterally. Integumentary (Hair, Skin) Wound #1 status is Healed - Epithelialized. Original cause of wound was Blister. The date acquired was: 03/22/2021. The wound has been in treatment 3 weeks. The wound is located on the Right,Posterior Ankle. The wound measures 0cm length x 0cm width x 0cm depth; 0cm^2 area and 0cm^3 volume. There is no tunneling or undermining noted. There is a none present amount of drainage noted. The wound margin is distinct with the outline attached to the wound base. There is no granulation within the wound bed. There is no necrotic tissue within the wound bed. Assessment Active Problems ICD-10 Non-pressure chronic ulcer of other part of right lower leg with unspecified severity Venous insufficiency (chronic) (peripheral) Chronic atrial fibrillation, unspecified Patient has done well with Kerlix/Coban wrap with PolyMem silver. His wound is closed. I recommended he use his compression stocking once he leaves and returns back to his facility. He  can follow-up as needed. Plan Discharge From [facility] Services: Discharge from Wound Care Center - call if any future wound care needs. Edema Control - Lymphedema / SCD / Other: Elevate legs to the level of the heart or above for 30 minutes daily and/or when sitting, a frequency of: - throughout the day. Avoid standing for long periods of time. Exercise regularly Moisturize legs daily. - Facility to lotion both legs every night before bed. Compression stocking or Garment 10-20 mm/Hg pressure to: - facility to apply to both legs in the morning and remove at night. 1. Discharge from our clinic and can follow-up as needed Electronic Signature(s) Signed: 05/18/2021 2:57:39 PM By: Geralyn Corwin DO Entered By: Geralyn Corwin on 05/18/2021 14:56:54 -------------------------------------------------------------------------------- HxROS Details Patient Name: Date of Service: Jesus Fowler, Jesus Fowler 05/18/2021 1:30 PM Medical Record Number: [id number] Patient Account Number: [id number] Date of Birth/Sex: Treating RN: July 14, 1933 (87 y.o. 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 Cardiovascular Medical History: Positive for: Arrhythmia - A-Fib; Congestive Heart Failure; Hypertension Genitourinary Medical History: Past Medical History Notes: Prostate Disorder Musculoskeletal Medical History: Positive for: Osteoarthritis HBO Extended History Items Ear/Nose/Mouth/Throat: Eyes: Chronic sinus Cataracts problems/congestion Immunizations Pneumococcal Vaccine: Received Pneumococcal  Vaccination: Yes Implantable Devices None Family and Social History Cancer: Yes - Mother; Diabetes: No; Heart Disease: Yes - Mother; Hereditary Spherocytosis: No; Hypertension: Yes - Mother; Kidney Disease: No; Lung Disease: No; Seizures: No; Stroke: No; Thyroid Problems: No; Tuberculosis: No; Former smoker; Marital Status - Separated; Alcohol Use: Rarely; Drug Use: No History; Caffeine Use: Daily - T Financial Concerns: No; Food, Clothing or Shelter Needs: No; Support System Lacking: No; Transportation Concerns: ea; No Electronic Signature(s) Signed: 05/18/2021 2:57:39 PM By: Geralyn Corwin DO Signed: 05/18/2021 6:00:49 PM By: Shawn Stall Entered By: Geralyn Corwin on 05/18/2021 14:53:21 -------------------------------------------------------------------------------- SuperBill Details Patient Name: Date of Service: [place], [place] 05/18/2021 Medical Record Number: [id number] Patient Account Number: [id number] Date of Birth/Sex: Treating RN: 1933-02-13 (87 y.o. Tammy Sours Primary Care Provider: Synthia Innocent Other Clinician: Referring Provider: Treating Provider/Extender: Marcelyn Ditty in Treatment: 3 Diagnosis Coding ICD-10 Codes Code Description [phone] Non-pressure chronic ulcer of other part of right lower leg with unspecified severity I87.2 Venous insufficiency (chronic) (peripheral) I48.20 Chronic atrial fibrillation, unspecified Facility Procedures CPT4 Code:  [id number] Description: 99213 - WOUND CARE VISIT-LEV 3 EST PT Modifier: Quantity: 1 Physician Procedures : CPT4 Code Description Modifier [id number] 99213 - WC PHYS LEVEL 3 - EST PT ICD-10 Diagnosis Description L97.819 Non-pressure chronic ulcer of other part of right lower leg with unspecified severity I87.2 Venous insufficiency (chronic) (peripheral) I48.20  Chronic atrial fibrillation, unspecified Quantity: 1 Electronic Signature(s) Signed: 05/18/2021 2:57:39 PM By: Geralyn CorwinHoffman,  Vanassa Penniman DO Entered By: Geralyn CorwinHoffman, Lilley Hubble on 05/18/2021 14:57:13

## 2021-05-22 NOTE — Progress Notes (Signed)
Jesus Fowler, Jesus Fowler ([id number]) Visit Report for 05/18/2021 Arrival Information Details Patient Name: Date of Service: Jesus, Fowler [place] 05/18/2021 1:30 PM Medical Record Number: [id number] Patient Account Number: [id number] Date of Birth/Sex: Treating RN: 10/05/1933 (87 y.o. Jesus Fowler Primary Care Karole Oo: Synthia Innocent Other Clinician: Referring Carleena Mires: Treating Norvell Caswell/Extender: Jesus Fowler in Treatment: 3 Visit Information History Since Last Visit Added or deleted any medications: No Patient Arrived: Wheel Chair Any new allergies or adverse reactions: No Arrival Time: 13:50 Had a fall or experienced change in No Accompanied By: family member activities of daily living that may affect Transfer Assistance: None risk of falls: Patient Identification Verified: Yes Signs or symptoms of abuse/neglect since last visito No Secondary Verification Process Completed: Yes Hospitalized since last visit: No Patient Requires Transmission-Based Precautions: No Implantable device outside of the clinic excluding No Patient Has Alerts: Yes cellular tissue based products placed in the center Patient Alerts: Patient on Blood Thinner since last visit: ABI: R=1.77 L=1.77 Has Dressing in Place as Prescribed: Yes TBI: R=0.61 L=0.54 Has Compression in Place as Prescribed: Yes Pain Present Now: No Electronic Signature(s) Signed: 05/22/2021 5:27:53 PM By: Jesus Abts RN, BSN Entered By: Jesus Fowler on 05/18/2021 13:54:30 -------------------------------------------------------------------------------- Clinic Level of Care Assessment Details Patient Name: Date of Service: [place], [place] 05/18/2021 1:30 PM Medical Record Number: [id number] Patient Account Number: [id number] Date of Birth/Sex: Treating RN: 21-Apr-1933 (87 y.o. Harlon Flor, Millard.Loa Primary Care [place] Sorn: Synthia Innocent Other Clinician: Referring Clancey Welton: Treating Meagan Ancona/Extender: Jesus Fowler in Treatment: 3 Clinic Level of Care Assessment Items TOOL 4 Quantity Score X- 1 0 Use when only an EandM is performed on FOLLOW-UP visit ASSESSMENTS - Nursing Assessment / Reassessment X- 1 10 Reassessment of Co-morbidities (includes updates in patient status) X- 1 5 Reassessment of Adherence to Treatment Plan ASSESSMENTS - Wound and Skin A ssessment / Reassessment X - Simple Wound Assessment / Reassessment - one wound 1 5 []  - 0 Complex Wound Assessment / Reassessment - multiple wounds X- 1 10 Dermatologic / Skin Assessment (not related to wound area) ASSESSMENTS - Focused Assessment X- 1 5 Circumferential Edema Measurements - multi extremities X- 1 10 Nutritional Assessment / Counseling / Intervention []  - 0 Lower Extremity Assessment (monofilament, tuning fork, pulses) []  - 0 Peripheral Arterial Disease Assessment (using hand held doppler) ASSESSMENTS - Ostomy and/or Continence Assessment and Care []  - 0 Incontinence Assessment and Management []  - 0 Ostomy Care Assessment and Management (repouching, etc.) PROCESS - Coordination of Care X - Simple Patient / Family Education for ongoing care 1 15 []  - 0 Complex (extensive) Patient / Family Education for ongoing care X- 1 10 Staff obtains , Records, T Results / Process Orders est X- 1 10 Staff telephones HHA, Nursing Homes / Clarify orders / etc []  - 0 Routine Transfer to another Facility (non-emergent condition) []  - 0 Routine Hospital Admission (non-emergent condition) []  - 0 New Admissions / / Ordering NPWT Apligraf, etc. , []  - 0 Emergency Hospital Admission (emergent condition) X- 1 10 Simple Discharge Coordination []  - 0 Complex (extensive) Discharge Coordination PROCESS - Special Needs []  - 0 Pediatric / Minor Patient Management []  - 0 Isolation Patient Management []  - 0 Hearing / Language / Visual special needs []  - 0 Assessment of  Community assistance (transportation, D/C planning, etc.) []  - 0 Additional assistance / Altered mentation []  - 0 Support Surface(s) Assessment (bed, cushion, seat, etc.) INTERVENTIONS - Wound Cleansing / Measurement X -  Simple Wound Cleansing - one wound 1 5 []  - 0 Complex Wound Cleansing - multiple wounds X- 1 5 Wound Imaging (photographs - any number of wounds) []  - 0 Wound Tracing (instead of photographs) X- 1 5 Simple Wound Measurement - one wound []  - 0 Complex Wound Measurement - multiple wounds INTERVENTIONS - Wound Dressings []  - 0 Small Wound Dressing one or multiple wounds []  - 0 Medium Wound Dressing one or multiple wounds []  - 0 Large Wound Dressing one or multiple wounds []  - 0 Application of Medications - topical []  - 0 Application of Medications - injection INTERVENTIONS - Miscellaneous []  - 0 External ear exam []  - 0 Specimen Collection (cultures, biopsies, blood, body fluids, etc.) []  - 0 Specimen(s) / Culture(s) sent or taken to Lab for analysis []  - 0 Patient Transfer (multiple staff / / Similar devices) []  - 0 Simple Staple / Suture removal (25 or less) []  - 0 Complex Staple / Suture removal (26 or more) []  - 0 Hypo / Hyperglycemic Management (close monitor of Blood Glucose) []  - 0 Ankle / Brachial Index (ABI) - do not check if billed separately X- 1 5 Vital Signs Has the patient been seen at the hospital within the last three years: Yes Total Score: 110 Level Of Care: New/Established - Level 3 Electronic Signature(s) Signed: 05/18/2021 6:00:49 PM By: Entered By: on 05/18/2021 14:31:57 -------------------------------------------------------------------------------- Encounter Discharge Information Details Patient Name: Date of Service: [place], 05/18/2021 1:30 PM Medical Record Number: Patient Account Number: Date of Birth/Sex: Treating RN: 08-Jan-1933 (87 y.o. Nurse, adult Primary Care Lovelace Cerveny: Other Clinician: Referring Boleslaw Borghi: Treating Deniel Mcquiston/Extender: in Treatment: 3 Encounter Discharge Information Items Discharge Condition: Stable Ambulatory Status: Wheelchair Discharge Destination: Home Transportation: Private Auto Accompanied By: daughter Schedule Follow-up Appointment: No Clinical Summary of Care: Electronic Signature(s) Signed: 05/18/2021 6:00:49 PM By: Entered By: 05/20/2021 on 05/18/2021 14:32:57 -------------------------------------------------------------------------------- Lower Extremity Assessment Details Patient Name: Date of Service: Jesus Fowler, Jesus Fowler 05/18/2021 1:30 PM Medical Record Number: [place] Patient Account Number: [name] Date of Birth/Sex: Treating RN: February 25, 1933 (87 y.o. [id number] Primary Care Claryssa Sandner: 12/21/1933 Other Clinician: Referring Sharis Keeran: Treating Americo Vallery/Extender: 85 in Treatment: 3 Edema Assessment Assessed: Jesus Fowler: No] Synthia Innocent: No] Edema: [Left: No] [Right: No] Calf Left: Right: Point of Measurement: 32 cm From Medial Instep 26.6 cm 27 cm Ankle Left: Right: Point of Measurement: 9 cm From Medial Instep 17 cm 18.5 cm Vascular Assessment Pulses: Dorsalis Pedis Palpable: [Right:Yes] Electronic Signature(s) Signed: 05/22/2021 5:27:53 PM By: 05/20/2021 RN, BSN Entered By: Jesus Fowler on 05/18/2021 14:00:56 -------------------------------------------------------------------------------- Multi Wound Chart Details Patient Name: Date of Service: [place], Jesus Fowler 05/18/2021 1:30 PM Medical Record Number: [id number] Patient Account Number: [id number] Date of Birth/Sex: Treating RN: 02/24/1933 (87 y.o. Jesus Fowler Primary Care Tonjia Parillo: Synthia Innocent Other Clinician: Referring Jakayden Cancio: Treating Burnell Hurta/Extender: Jesus Fowler in  Treatment: 3 Vital Signs Height(in): Pulse(bpm): 76 Weight(lbs): 168 Blood Pressure(mmHg): 142/90 Body Mass Index(BMI): Temperature(F): 98.1 Respiratory Rate(breaths/min): 16 Photos: [1:No Photos Right, Posterior Ankle] [N/A:N/A N/A] Wound Location: [1:Blister] [N/A:N/A] Wounding Event: [1:Venous Leg Ulcer] [N/A:N/A] Primary Etiology: [1:Cataracts, Chronic sinus] [N/A:N/A] Comorbid History: [1:problems/congestion, Sleep Apnea, Arrhythmia, Congestive Heart Failure, Hypertension, Osteoarthritis 03/22/2021] [N/A:N/A] Date Acquired: [1:3] [N/A:N/A] Weeks of Treatment: [1:Healed - Epithelialized] [N/A:N/A] Wound Status: [1:0x0x0] [N/A:N/A] Measurements L x W x D (cm) [1:0] [N/A:N/A] A (cm) :  rea [1:0] [N/A:N/A] Volume (cm) : [1:100.00%] [N/A:N/A] % Reduction in Area: [1:100.00%] [N/A:N/A] % Reduction in Volume: [1:Full Thickness Without Exposed] [N/A:N/A] Classification: [1:Support Structures None Present] [N/A:N/A] Exudate Amount: [1:Distinct, outline attached] [N/A:N/A] Wound Margin: [1:None Present (0%)] [N/A:N/A] Granulation Amount: [1:None Present (0%)] [N/A:N/A] Necrotic Amount: [1:Fascia: No] [N/A:N/A] Exposed Structures: [1:Fat Layer (Subcutaneous Tissue): No Tendon: No Muscle: No Joint: No Bone: No Large (67-100%)] [N/A:N/A] Treatment Notes Electronic Signature(s) Signed: 05/18/2021 2:57:39 PM By: Jesus Corwin DO Signed: 05/18/2021 6:00:49 PM By: Jesus Fowler Entered By: Jesus Fowler on 05/18/2021 14:50:13 -------------------------------------------------------------------------------- Multi-Disciplinary Care Plan Details Patient Name: Date of Service: Jesus Fowler, Jesus Fowler 05/18/2021 1:30 PM Medical Record Number: [id number] Patient Account Number: [id number] Date of Birth/Sex: Treating RN: 15-Oct-1933 (87 y.o. Jesus Fowler Primary Care Raihana Balderrama: Synthia Innocent Other Clinician: Referring Onnika Siebel: Treating Lanissa Cashen/Extender: Jesus Fowler in Treatment: 3 Active Inactive Electronic Signature(s) Signed: 05/18/2021 6:00:49 PM By: Jesus Fowler Entered By: Jesus Fowler on 05/18/2021 14:29:35 -------------------------------------------------------------------------------- Pain Assessment Details Patient Name: Date of Service: Jesus Fowler, Jesus Fowler 05/18/2021 1:30 PM Medical Record Number: [id number] Patient Account Number: [id number] Date of Birth/Sex: Treating RN: May 13, 1933 (87 y.o. Jesus Fowler Primary Care Dondi Aime: Synthia Innocent Other Clinician: Referring Milan Clare: Treating Doylene Splinter/Extender: Jesus Fowler in Treatment: 3 Active Problems Location of Pain Severity and Description of Pain Patient Has Paino No Site Locations Pain Management and Medication Current Pain Management: Electronic Signature(s) Signed: 05/22/2021 5:27:53 PM By: Jesus Abts RN, BSN Entered By: Jesus Fowler on 05/18/2021 13:54:48 -------------------------------------------------------------------------------- Patient/Caregiver Education Details Patient Name: Date of Service: Jesus Fowler 5/19/2022andnbsp1:30 PM Medical Record Number: [id number] Patient Account Number: [id number] Date of Birth/Gender: Treating RN: May 14, 1933 (87 y.o. Jesus Fowler Primary Care Physician: Synthia Innocent Other Clinician: Referring Physician: Treating Physician/Extender: Jesus Fowler in Treatment: 3 Education Assessment Education Provided To: Patient Education Topics Provided Wound/Skin Impairment: Handouts: Skin Care Do's and Dont's Methods: Explain/Verbal Responses: Reinforcements needed Electronic Signature(s) Signed: 05/18/2021 6:00:49 PM By: Jesus Fowler Entered By: Jesus Fowler on 05/18/2021 14:29:46 -------------------------------------------------------------------------------- Wound Assessment Details Patient Name: Date of Service: Jesus Fowler, Jesus Fowler 05/18/2021  1:30 PM Medical Record Number: [id number] Patient Account Number: [id number] Date of Birth/Sex: Treating RN: [birth year] (87 y.o. Jesus Fowler Primary Care Temitope Griffing: Synthia Innocent Other Clinician: Referring Bindu Docter: Treating Marykatherine Sherwood/Extender: Jesus Fowler in Treatment: 3 Wound Status Wound Number: 1 Primary Venous Leg Ulcer Etiology: Wound Location: Right, Posterior Ankle Wound Healed - Epithelialized Wounding Event: Blister Status: Date Acquired: 03/22/2021 Comorbid Cataracts, Chronic sinus problems/congestion, Sleep Apnea, Weeks Of Treatment: 3 History: Arrhythmia, Congestive Heart Failure, Hypertension, Osteoarthritis Clustered Wound: No Wound Measurements Length: (cm) Width: (cm) Depth: (cm) Area: (cm) Volume: (cm) 0 % Reduction in Area: 100% 0 % Reduction in Volume: 100% 0 Epithelialization: Large (67-100%) 0 Tunneling: No 0 Undermining: No Wound Description Classification: Full Thickness Without Exposed Support Structures Wound Margin: Distinct, outline attached Exudate Amount: None Present Foul Odor After Cleansing: No Slough/Fibrino No Wound Bed Granulation Amount: None Present (0%) Exposed Structure Necrotic Amount: None Present (0%) Fascia Exposed: No Fat Layer (Subcutaneous Tissue) Exposed: No Tendon Exposed: No Muscle Exposed: No Joint Exposed: No Bone Exposed: No Electronic Signature(s) Signed: 05/22/2021 5:27:53 PM By: Jesus Abts RN, BSN Entered By: Jesus Fowler on 05/18/2021 14:01:16 -------------------------------------------------------------------------------- Vitals Details Patient Name: Date of Service: [place], Jesus Fowler 05/18/2021 1:30 PM Medical Record Number: [id number] Patient Account Number: [id number] Date of Birth/Sex: Treating RN: [birth year] (87 y.o. Jesus Fowler Primary  Care Yasiel Goyne: Synthia InnocentGreen, Deborah Other Clinician: Referring Hilari Wethington: Treating Korey Arroyo/Extender: Jesus DittyHoffman,  Jessica Green, Deborah Weeks in Treatment: 3 Vital Signs Time Taken: 13:54 Temperature (F): 98.1 Weight (lbs): 168 Pulse (bpm): 76 Respiratory Rate (breaths/min): 16 Blood Pressure (mmHg): 142/90 Reference Range: 80 - 120 mg / dl Electronic Signature(s) Signed: 05/22/2021 5:27:53 PM By: Jesus AbtsLynch, Shatara RN, BSN Entered By: Jesus AbtsLynch, Shatara on 05/18/2021 13:54:44

## 2021-05-30 DIAGNOSIS — M5416 Radiculopathy, lumbar region: Secondary | ICD-10-CM | POA: Diagnosis not present

## 2021-05-30 DIAGNOSIS — M6281 Muscle weakness (generalized): Secondary | ICD-10-CM | POA: Diagnosis not present

## 2021-05-31 DIAGNOSIS — I5032 Chronic diastolic (congestive) heart failure: Secondary | ICD-10-CM | POA: Diagnosis not present

## 2021-05-31 DIAGNOSIS — Z515 Encounter for palliative care: Secondary | ICD-10-CM | POA: Diagnosis not present

## 2021-05-31 DIAGNOSIS — M6281 Muscle weakness (generalized): Secondary | ICD-10-CM | POA: Diagnosis not present

## 2021-05-31 DIAGNOSIS — M5416 Radiculopathy, lumbar region: Secondary | ICD-10-CM | POA: Diagnosis not present

## 2021-05-31 DIAGNOSIS — I872 Venous insufficiency (chronic) (peripheral): Secondary | ICD-10-CM | POA: Diagnosis not present

## 2021-06-01 DIAGNOSIS — M5416 Radiculopathy, lumbar region: Secondary | ICD-10-CM | POA: Diagnosis not present

## 2021-06-01 DIAGNOSIS — M6281 Muscle weakness (generalized): Secondary | ICD-10-CM | POA: Diagnosis not present

## 2021-06-02 DIAGNOSIS — M5416 Radiculopathy, lumbar region: Secondary | ICD-10-CM | POA: Diagnosis not present

## 2021-06-02 DIAGNOSIS — M6281 Muscle weakness (generalized): Secondary | ICD-10-CM | POA: Diagnosis not present

## 2021-06-05 DIAGNOSIS — M6281 Muscle weakness (generalized): Secondary | ICD-10-CM | POA: Diagnosis not present

## 2021-06-05 DIAGNOSIS — M5416 Radiculopathy, lumbar region: Secondary | ICD-10-CM | POA: Diagnosis not present

## 2021-06-06 ENCOUNTER — Encounter: Payer: Self-pay | Admitting: Adult Health

## 2021-06-06 ENCOUNTER — Non-Acute Institutional Stay (SKILLED_NURSING_FACILITY): Payer: Medicare Other | Admitting: Adult Health

## 2021-06-06 DIAGNOSIS — Z9989 Dependence on other enabling machines and devices: Secondary | ICD-10-CM

## 2021-06-06 DIAGNOSIS — G4733 Obstructive sleep apnea (adult) (pediatric): Secondary | ICD-10-CM | POA: Diagnosis not present

## 2021-06-06 DIAGNOSIS — I700 Atherosclerosis of aorta: Secondary | ICD-10-CM

## 2021-06-06 DIAGNOSIS — I5033 Acute on chronic diastolic (congestive) heart failure: Secondary | ICD-10-CM

## 2021-06-06 DIAGNOSIS — I872 Venous insufficiency (chronic) (peripheral): Secondary | ICD-10-CM

## 2021-06-06 DIAGNOSIS — N400 Enlarged prostate without lower urinary tract symptoms: Secondary | ICD-10-CM

## 2021-06-06 DIAGNOSIS — K5909 Other constipation: Secondary | ICD-10-CM

## 2021-06-06 DIAGNOSIS — M5416 Radiculopathy, lumbar region: Secondary | ICD-10-CM | POA: Diagnosis not present

## 2021-06-06 DIAGNOSIS — I482 Chronic atrial fibrillation, unspecified: Secondary | ICD-10-CM | POA: Diagnosis not present

## 2021-06-06 DIAGNOSIS — M6281 Muscle weakness (generalized): Secondary | ICD-10-CM | POA: Diagnosis not present

## 2021-06-06 DIAGNOSIS — K219 Gastro-esophageal reflux disease without esophagitis: Secondary | ICD-10-CM

## 2021-06-06 NOTE — Progress Notes (Signed)
Location:  [facility] Room Number: 139-W Place of Service:  SNF (31)   CODE STATUS: DNR  No Known Allergies  Chief Complaint  Patient presents with   Annual Exam    .     HPI:  He is a 87 year old long term resident of this facility being seen for his annual exam. He has not required any hospitalizations over the past year and has not had any ED visits. He is being followed by vein/vascular and wound clinic for his chronic venous stasis. Overall thre is little cange in his status. His weight is without significant change. He does not have any uncontrolled pain present. He does not have any reports of uncontrolled anxieyt or depressive thoughts. He continues to be followed for his chronic illnesses including: Lumbar radiculopathy     Atrial fibrillation chronic:  Acute on chronic heart failure with preserved ejection fracture diastolic dysfunction: OSA   Past Medical History:  Diagnosis Date   A-fib (HCC)    Cellulitis    CHF (congestive heart failure) (HCC)    Hemorrhoid    Hypertension    Prostate disorder     Past Surgical History:  Procedure Laterality Date   BIOPSY  03/17/2020   Procedure: BIOPSY;  Surgeon: Corbin Ade, MD;  [facility];  Service: Endoscopy;;  gastric rectal   ESOPHAGEAL DILATION N/A 03/17/2020   Procedure: ESOPHAGEAL DILATION;  Surgeon: Corbin Ade, MD;  [facility]: [facility];  Service: Endoscopy;  Laterality: N/A;   ESOPHAGOGASTRODUODENOSCOPY (EGD) WITH PROPOFOL N/A 03/17/2020   Procedure: ESOPHAGOGASTRODUODENOSCOPY (EGD) WITH PROPOFOL;  Surgeon: Corbin Ade, MD;  [facility]: [facility];  Service: Endoscopy;  Laterality: N/A;   FLEXIBLE SIGMOIDOSCOPY N/A 03/17/2020   Procedure: FLEXIBLE SIGMOIDOSCOPY WITH PROPOFOL;  Surgeon: Corbin Ade, MD;  Location: [facility];  Service: Endoscopy;  Laterality: N/A;   HEMORRHOID SURGERY     patient reports several hemorrhoid surgeries in the past    Social  History   Socioeconomic History   Marital status: Legally Separated    Spouse name: Not on file   Number of children: Not on file   Years of education: Not on file   Highest education level: Not on file  Occupational History   Occupation: retired   Tobacco Use   Smoking status: Former Smoker   Smokeless tobacco: Never Used  Building services engineer Use: Never used  Substance and Sexual Activity   Alcohol use: Yes    Comment: "very little"   Drug use: Never   Sexual activity: Not Currently  Other Topics Concern   Not on file  Social History Narrative   On his third marriage x 18 years. Has 3 children and several grandchildren. His son lives in [place], [place]  and is primary contact. 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 .  Former smoker.    Long term resident of SNF        Social Determinants of Health   Financial Resource Strain: Not on file  Food Insecurity: Not on file  Transportation Needs: Not on file  Physical Activity: Not on file  Stress: Not on file  Social Connections: Not on file  Intimate Partner Violence: Not on file   Family History  Problem Relation Age of Onset   Heart disease Mother    Colon cancer Neg Hx       VITAL SIGNS BP 130/77   Pulse  74   Temp 97.8 F (36.6 C)   Ht 5\' 5"  (1.651 m)   Wt 169 lb 3.2 oz (76.7 kg)   SpO2 93%   BMI 28.16 kg/m   Outpatient Encounter Medications as of 06/06/2021  Medication Sig   acetaminophen (TYLENOL) 325 MG tablet Take 2 tablets (650 mg total) by mouth every 6 (six) hours as needed for mild pain, fever or headache (or Fever >/= 101).   albuterol (VENTOLIN HFA) 108 (90 Base) MCG/ACT inhaler Inhale 2 puffs into the lungs every 6 (six) hours as needed for wheezing or shortness of breath.   apixaban (ELIQUIS) 5 MG TABS tablet Take 1 tablet (5 mg total) by mouth 2 (two) times daily.   Balsam [place]-Castor Oil (VENELEX) OINT Apply 1 application topically in the morning, at noon, and at bedtime.    carboxymethylcellul-glycerin (OPTIVE) 0.5-0.9 % ophthalmic solution Place 1 drop into both eyes 4 (four) times daily. For dry eyes   Ensure (ENSURE) Take 237 mLs by mouth daily at 12 noon. For weight loss   gabapentin (NEURONTIN) 100 MG capsule Take 1 capsule (100 mg total) by mouth at bedtime.   metoprolol succinate (TOPROL-XL) 25 MG 24 hr tablet Take 0.5 tablets (12.5 mg total) by mouth daily.   Miconazole Nitrate 2 % POWD 1 application by Does not apply route as needed. Special Instructions: every shift prn redness in skin folds, As Needed; PRN 1, PRN 2, PRN 3   NON FORMULARY BiPap at setting of 20/16 while sleeping. Twice A Day   NON FORMULARY Diet: _____ Regular, __x____ NAS, _______Consistent Carbohydrate, _______NPO _____Other   OXYGEN Inhale 2 L into the lungs continuous.    pantoprazole (PROTONIX) 40 MG tablet Take 1 tablet (40 mg total) by mouth daily.   polyethylene glycol (MIRALAX / GLYCOLAX) 17 g packet Take 17 g by mouth daily.   sertraline (ZOLOFT) 50 MG tablet Take 50 mg by mouth daily.   Skin Protectants, Misc. (EUCERIN) cream Apply 1 application topically. Once a week on Wednesday. Special Instructions: Bilateral lower extremities- Clean legs with soap and water. Pat dry. Apply Eucerin cream to bilateral legs, apply kerlix, then apply coban wrap from toes to knees   sodium chloride (OCEAN) 0.65 % SOLN nasal spray Place 2 sprays into both nostrils every 6 (six) hours as needed (For Dry Stuffy Nose).   tamsulosin (FLOMAX) 0.4 MG CAPS capsule Take 1 capsule (0.4 mg total) by mouth daily after supper.   vitamin C (ASCORBIC ACID) 500 MG tablet Take 1,000 mg by mouth daily.   [DISCONTINUED] Ascorbic Acid (VITAMIN C) 1000 MG tablet Take 1,000 mg by mouth daily.   No facility-administered encounter medications on file as of 06/06/2021.     SIGNIFICANT DIAGNOSTIC EXAMS   PREVIOUS  05-27-20: MRI of brain:  No acute abnormality Motion degraded study Atrophy with mild chronic  microvascular ischemic change in the white matter.  05-27-20: MRI of lumbar spine:  1. Image quality degraded by moderate motion. 2. Moderate levoscoliosis lumbar spine. Multilevel degenerative changes throughout the lumbar spine with spinal and foraminal stenosis as described above. 3. Moderate spinal stenosis L2-3 with moderate subarticular and foraminal stenosis right greater than left 4. 7 mm anterolisthesis L5-S1 with possible pars defect on the left. Marked left foraminal encroachment with impingement left L5 nerveroot. 5. These results will be called to the ordering clinician or representative by the Radiologist Assistant, and communication documented in the PACS or [employer] Dashboard.   04-03-21: right hip x-ray: No  acute bone abnormality. A follow-up study is recommended if patient's pain persists.  NO NEW EXAMS    LABS REVIEWED PREVIOUS  07-28-20: vit D 93.39 11-10-20: wbc 7.3; hgb 13.7; hct 44.4; mcv 100.9 plt 156; glucose 75; bun 13; creat 0.73; k+ 3.8; na++ 149; ca 9.2 liver normal albumin 3.1 chol 83; ldl 40; trig 38; hdl 35; tsh 1.020 mag 1.9; vit D 52.71 01-24-21: wbc 8.4; hgb 15.4; hct 48.8; mcv 98.6 plt 174; glucose 91; bun 12; creat 0.64; k+ 4.0; na++ 142; ca 9.4 GFR>60  03-07-21: wbc 9.5; hgb 16.3; hct 51.8; mcv 99.2 plt 179 ;glucose 80; bun 15; creat 0.75; k+ 3.7; na++ 144; ca 9.4; GFR>60 03-09-21: urine culture no growth  TODAY  04-05-21:hgb 12.4 hct 40.2 04-12-21: hgb 12.7; hct 40.5   Review of Systems  Constitutional:  Negative for malaise/fatigue.  Respiratory:  Negative for cough and shortness of breath.   Cardiovascular:  Negative for chest pain, palpitations and leg swelling.  Gastrointestinal:  Negative for abdominal pain, constipation and heartburn.  Musculoskeletal:  Negative for back pain, joint pain and myalgias.  Skin: Negative.   Neurological:  Negative for dizziness.  Psychiatric/Behavioral:  The patient is not nervous/anxious.    Physical  Exam Constitutional:      General: He is not in acute distress.    Appearance: He is well-developed. He is not diaphoretic.  HENT:     Right Ear: Ear canal normal.     Left Ear: Ear canal normal.     Nose: Nose normal.     Mouth/Throat:     Mouth: Mucous membranes are moist.     Pharynx: Oropharynx is clear.  Eyes:     Conjunctiva/sclera: Conjunctivae normal.  Neck:     Thyroid: No thyromegaly.  Cardiovascular:     Rate and Rhythm: Normal rate and regular rhythm.     Pulses: Normal pulses.     Heart sounds: Normal heart sounds.  Pulmonary:     Effort: Pulmonary effort is normal. No respiratory distress.     Breath sounds: Normal breath sounds.  Abdominal:     General: Bowel sounds are normal. There is no distension.     Palpations: Abdomen is soft.     Tenderness: There is no abdominal tenderness.  Musculoskeletal:        General: Normal range of motion.     Cervical back: Neck supple.     Right lower leg: No edema.     Left lower leg: No edema.  Lymphadenopathy:     Cervical: No cervical adenopathy.  Skin:    General: Skin is warm and dry.     Comments:  Bilateral lower extremities discolored; legs are wrapped.   Neurological:     Mental Status: He is alert and oriented to person, place, and time.  Psychiatric:        Mood and Affect: Mood normal.    ASSESSMENT/ PLAN:   TODAY  GERD without esophagitis: is stable will continue protonix 40 mg daily   2. Chronic constipation: is stable will continue miralax twice daily and senna s 2 tabs daily   3. Chronic venous insufficiency / venous stasis dermatis bilateral lower extremity: is stable bilateral lower extremities wrapped.   4. Protein calorie malnutrition: is stable albumin 3.1 will continue supplements as directed  5. BPH without urinary obstruction is stable will continue flomax 0.4 mg daily  6. Dyslipidemia is stable LDL 40 trig 38 is off statin and fish oil  7. Lumbar radiculopathy L5/S1 is stable will  continue gabapentin 100 mg nightly    8. Atrial fibrillation chronic: is stable will continue toprol xl 12.5 mg daily for rate control and eliquis 5 mg twice daily   9. Acute on chronic heart failure with preserved ejection fracture diastolic dysfunction: is compensated will continue toprol xl 12.5 mg daily   10. OSA is on CPAP is stable will continue cpap nightly   11. Aortic atherosclerosis (03-14-20 ct) will monitor       Synthia Innocent NP [facility]  Contact [phone] Monday through Friday 8am- 5pm  After hours call [phone]

## 2021-06-07 DIAGNOSIS — M6281 Muscle weakness (generalized): Secondary | ICD-10-CM | POA: Diagnosis not present

## 2021-06-07 DIAGNOSIS — M5416 Radiculopathy, lumbar region: Secondary | ICD-10-CM | POA: Diagnosis not present

## 2021-06-08 DIAGNOSIS — M5416 Radiculopathy, lumbar region: Secondary | ICD-10-CM | POA: Diagnosis not present

## 2021-06-08 DIAGNOSIS — M6281 Muscle weakness (generalized): Secondary | ICD-10-CM | POA: Diagnosis not present

## 2021-06-09 DIAGNOSIS — M6281 Muscle weakness (generalized): Secondary | ICD-10-CM | POA: Diagnosis not present

## 2021-06-09 DIAGNOSIS — M5416 Radiculopathy, lumbar region: Secondary | ICD-10-CM | POA: Diagnosis not present

## 2021-06-12 DIAGNOSIS — M5416 Radiculopathy, lumbar region: Secondary | ICD-10-CM | POA: Diagnosis not present

## 2021-06-12 DIAGNOSIS — M6281 Muscle weakness (generalized): Secondary | ICD-10-CM | POA: Diagnosis not present

## 2021-06-14 DIAGNOSIS — M6281 Muscle weakness (generalized): Secondary | ICD-10-CM | POA: Diagnosis not present

## 2021-06-14 DIAGNOSIS — M5416 Radiculopathy, lumbar region: Secondary | ICD-10-CM | POA: Diagnosis not present

## 2021-06-15 DIAGNOSIS — M5416 Radiculopathy, lumbar region: Secondary | ICD-10-CM | POA: Diagnosis not present

## 2021-06-15 DIAGNOSIS — M6281 Muscle weakness (generalized): Secondary | ICD-10-CM | POA: Diagnosis not present

## 2021-06-19 DIAGNOSIS — M6281 Muscle weakness (generalized): Secondary | ICD-10-CM | POA: Diagnosis not present

## 2021-06-19 DIAGNOSIS — M5416 Radiculopathy, lumbar region: Secondary | ICD-10-CM | POA: Diagnosis not present

## 2021-06-20 DIAGNOSIS — M5416 Radiculopathy, lumbar region: Secondary | ICD-10-CM | POA: Diagnosis not present

## 2021-06-20 DIAGNOSIS — M6281 Muscle weakness (generalized): Secondary | ICD-10-CM | POA: Diagnosis not present

## 2021-06-21 DIAGNOSIS — M5416 Radiculopathy, lumbar region: Secondary | ICD-10-CM | POA: Diagnosis not present

## 2021-06-21 DIAGNOSIS — M6281 Muscle weakness (generalized): Secondary | ICD-10-CM | POA: Diagnosis not present

## 2021-06-22 DIAGNOSIS — M5416 Radiculopathy, lumbar region: Secondary | ICD-10-CM | POA: Diagnosis not present

## 2021-06-22 DIAGNOSIS — M6281 Muscle weakness (generalized): Secondary | ICD-10-CM | POA: Diagnosis not present

## 2021-06-24 ENCOUNTER — Encounter (HOSPITAL_COMMUNITY)
Admission: RE | Admit: 2021-06-24 | Discharge: 2021-06-24 | Disposition: A | Discharge status: Home / Self Care | Payer: Medicare Other | Source: Skilled Nursing Facility | Attending: Adult Health | Admitting: Adult Health

## 2021-06-24 DIAGNOSIS — U071 COVID-19: Secondary | ICD-10-CM | POA: Diagnosis not present

## 2021-06-24 DIAGNOSIS — R918 Other nonspecific abnormal finding of lung field: Secondary | ICD-10-CM | POA: Diagnosis not present

## 2021-06-24 LAB — CBC
HCT: 47.8 % (ref 39.0–52.0)
Hemoglobin: 14.7 g/dL (ref 13.0–17.0)
MCH: 31.7 pg (ref 26.0–34.0)
MCHC: 30.8 g/dL (ref 30.0–36.0)
MCV: 103 fL — ABNORMAL HIGH (ref 80.0–100.0)
Platelets: 157 10*3/uL (ref 150–400)
RBC: 4.64 MIL/uL (ref 4.22–5.81)
RDW: 14.7 % (ref 11.5–15.5)
WBC: 8.7 10*3/uL (ref 4.0–10.5)
nRBC: 0 % (ref 0.0–0.2)

## 2021-06-24 LAB — BASIC METABOLIC PANEL
Anion gap: 7 (ref 5–15)
BUN: 13 mg/dL (ref 8–23)
CO2: 32 mmol/L (ref 22–32)
Calcium: 9 mg/dL (ref 8.9–10.3)
Chloride: 99 mmol/L (ref 98–111)
Creatinine, Ser: 0.75 mg/dL (ref 0.61–1.24)
GFR, Estimated: >60 mL/min (ref >60)
Glucose, Bld: 176 mg/dL — ABNORMAL HIGH (ref 70–99)
Potassium: 4.2 mmol/L (ref 3.5–5.1)
Sodium: 138 mmol/L (ref 135–145)

## 2021-06-24 LAB — C-REACTIVE PROTEIN: CRP: 1 mg/dL — ABNORMAL HIGH (ref <1.0)

## 2021-06-24 LAB — RESP PANEL BY RT-PCR (FLU A&B, COVID) ARPGX2
Influenza A by PCR: NEGATIVE
Influenza B by PCR: NEGATIVE
SARS Coronavirus 2 by RT PCR: POSITIVE — AB

## 2021-06-24 LAB — D-DIMER, QUANTITATIVE: D-Dimer, Quant: 0.31 ug/mL-FEU (ref 0.00–0.50)

## 2021-07-12 DIAGNOSIS — Z515 Encounter for palliative care: Secondary | ICD-10-CM | POA: Diagnosis not present

## 2021-07-12 DIAGNOSIS — I5032 Chronic diastolic (congestive) heart failure: Secondary | ICD-10-CM | POA: Diagnosis not present

## 2021-07-12 DIAGNOSIS — I872 Venous insufficiency (chronic) (peripheral): Secondary | ICD-10-CM | POA: Diagnosis not present

## 2021-07-14 ENCOUNTER — Encounter: Payer: Self-pay | Admitting: Internal Medicine

## 2021-07-14 ENCOUNTER — Non-Acute Institutional Stay (SKILLED_NURSING_FACILITY): Payer: Medicare Other | Admitting: Internal Medicine

## 2021-07-14 DIAGNOSIS — J310 Chronic rhinitis: Secondary | ICD-10-CM | POA: Insufficient documentation

## 2021-07-14 DIAGNOSIS — I482 Chronic atrial fibrillation, unspecified: Secondary | ICD-10-CM | POA: Diagnosis not present

## 2021-07-14 DIAGNOSIS — K219 Gastro-esophageal reflux disease without esophagitis: Secondary | ICD-10-CM

## 2021-07-14 DIAGNOSIS — I872 Venous insufficiency (chronic) (peripheral): Secondary | ICD-10-CM

## 2021-07-14 DIAGNOSIS — F321 Major depressive disorder, single episode, moderate: Secondary | ICD-10-CM

## 2021-07-14 NOTE — Assessment & Plan Note (Signed)
Wound Care Nurse reports 3 stasis ulcers LLE stable. Both LE wrapped

## 2021-07-14 NOTE — Assessment & Plan Note (Addendum)
He describes occasional dysphagia.PMH of esophageal dilation. Speech Therapy to follow him

## 2021-07-14 NOTE — Patient Instructions (Signed)
See assessment and plan under each diagnosis in the problem list and acutely for this visit 

## 2021-07-14 NOTE — Assessment & Plan Note (Signed)
Probably related to OSA equipment & nasal O2. Trial of Flonase bid

## 2021-07-14 NOTE — Assessment & Plan Note (Signed)
Rate controlled w present regimen. No change indicated.

## 2021-07-14 NOTE — Assessment & Plan Note (Signed)
Clinically depression present based on multiple, non medical complaints. Antidepressant initiated.

## 2021-07-14 NOTE — Progress Notes (Signed)
NURSING HOME LOCATION: [facility] ROOM NUMBER:  139 W  CODE STATUS:  Full Code  PCP:  Synthia Innocent NP  This is a nursing facility follow up visit of chronic medical diagnoses & to document compliance with Regulation 483.30 (c) in The Long Term Care Survey Manual Phase 2 which mandates caregiver visit ( visits can alternate among physician, PA or NP as per statutes) within 10 days of 30 days / 60 days/ 90 days post admission to SNF date    Interim medical record and care since last SNF visit was updated with review of diagnostic studies and change in clinical status since last visit were documented.  HPI: Patient is a permanent resident @ this facility with medical diagnoses of atrial fibrillation, history of GERD with esophageal stricture, history of congestive heart failure, prostatism, peripheral neuropathy, history of protein/caloric malnutrition, OSA, and essential hypertension. Surgeries and procedures include esophageal dilation. He employs BiPAP at bedtime and oxygen at 2 L/min as needed to keep O2 sats greater than 90%.  Wound care nurse is monitoring him and both lower extremities are wrapped as part of a stasis ulcer protocol.  She reports 3 stasis ulcers of the left lower extremity. Extensive labs were performed 6/25 and revealed no significant abnormalities except for glucose of 176 which is an outlier.  All other recorded glucoses were less than 100.  He also exhibited macrocytosis without anemia.  Review of systems: He has multiple concerns, the majority do not relate to health issues but to the facility diet, being confined, and concerns about staff compliance with orders.  He does describe decreased visual acuity and states that he has central vision loss.  He made the comment " I can only see an outline of you".  He describes occasional headaches over the right lateral crown.  He feels that his breathing is worse.  He describes nocturnal nasal congestion for  which he must take off his mask and clear the nose.  He also has sinus drainage.  He describes intermittent anxiety.  Also he has occasional dysphagia but not persistent.  Constitutional: No fever, significant weight change, fatigue  Eyes: No redness, discharge, pain ENT/mouth: No  purulent discharge, earache, change in hearing, sore throat  Cardiovascular: No chest pain, palpitations, paroxysmal nocturnal dyspnea  Respiratory: No hemoptysis Gastrointestinal: No heartburn, abdominal pain, nausea /vomiting, rectal bleeding, melena, change in bowels Genitourinary: No dysuria, hematuria, pyuria, incontinence, nocturia Dermatologic: No  pruritus Neurologic: No dizziness,  syncope, seizures, numbness, tingling Psychiatric: No insomnia, anorexia Endocrine: No change in hair/skin/nails, excessive thirst, excessive hunger, excessive urination  Hematologic/lymphatic: No significant bruising, lymphadenopathy, abnormal bleeding Allergy/immunology: No itchy/watery eyes, significant sneezing, urticaria, angioedema  Physical exam:  Pertinent or positive findings: He has the physiognomy of a chronic bronchitic with meaty facies.  He is hard of hearing.  His responses are delayed.  He is wearing nasal oxygen.  Proptosis is suggested; there is ptosis on the left.  He has a goatee which is closely cropped and he is Transport planner.  There is splitting of the first heart sound and increase in the second heart sound.  The rhythm is irregular.  Breath sounds are decreased.  Abdomen is markedly protuberant.  Pedal pulses are decreased.  The lower extremities are wrapped.  Above the wrapping dry, scaly, and somewhat keratotic hyperpigmented changes are present.  The right upper extremity is stronger than the left upper extremity.  He has interosseous wasting.  He exhibits intermittent tremor of the  hands.  He has isolated DIP arthritic changes.  General appearance:  no acute distress, increased work of breathing is present.    Lymphatic: No lymphadenopathy about the head, neck, axilla. Eyes: There is no scleral icterus. Ears:  External ear exam shows no significant lesions or deformities.   Nose:  External nasal examination shows no deformity or inflammation.  Oral exam:  There is no oropharyngeal erythema or exudate. Neck:  No thyromegaly, masses, tenderness noted.    Heart:  No gallop, murmur, click, rub .  Lungs:  without wheezes, rhonchi, rales, rubs. Abdomen: Bowel sounds are normal. Abdomen is soft and nontender with no organomegaly, hernias, masses. GU: Deferred  Extremities:  No cyanosis, clubbing, edema  Neurologic exam :Balance, Rhomberg, finger to nose testing could not be completed due to clinical state Skin: Warm & dry w/o tenting.  See summary under each active problem in the Problem List with associated updated therapeutic plan

## 2021-07-31 DIAGNOSIS — I739 Peripheral vascular disease, unspecified: Secondary | ICD-10-CM | POA: Diagnosis not present

## 2021-07-31 DIAGNOSIS — L602 Onychogryphosis: Secondary | ICD-10-CM | POA: Diagnosis not present

## 2021-08-07 ENCOUNTER — Other Ambulatory Visit (HOSPITAL_COMMUNITY)
Admission: RE | Admit: 2021-08-07 | Discharge: 2021-08-07 | Disposition: A | Discharge status: Home / Self Care | Payer: Medicare Other | Source: Skilled Nursing Facility | Attending: Adult Health | Admitting: Adult Health

## 2021-08-07 DIAGNOSIS — R059 Cough, unspecified: Secondary | ICD-10-CM | POA: Diagnosis not present

## 2021-08-07 DIAGNOSIS — R0989 Other specified symptoms and signs involving the circulatory and respiratory systems: Secondary | ICD-10-CM | POA: Diagnosis not present

## 2021-08-07 LAB — CBC WITH DIFFERENTIAL/PLATELET
Abs Immature Granulocytes: 0.04 10*3/uL (ref 0.00–0.07)
Basophils Absolute: 0 10*3/uL (ref 0.0–0.1)
Basophils Relative: 0 %
Eosinophils Absolute: 0.1 10*3/uL (ref 0.0–0.5)
Eosinophils Relative: 1 %
HCT: 44.5 % (ref 39.0–52.0)
Hemoglobin: 13.9 g/dL (ref 13.0–17.0)
Immature Granulocytes: 0 %
Lymphocytes Relative: 16 %
Lymphs Abs: 1.5 10*3/uL (ref 0.7–4.0)
MCH: 31.9 pg (ref 26.0–34.0)
MCHC: 31.2 g/dL (ref 30.0–36.0)
MCV: 102.1 fL — ABNORMAL HIGH (ref 80.0–100.0)
Monocytes Absolute: 0.8 10*3/uL (ref 0.1–1.0)
Monocytes Relative: 8 %
Neutro Abs: 7.2 10*3/uL (ref 1.7–7.7)
Neutrophils Relative %: 75 %
Platelets: 111 10*3/uL — ABNORMAL LOW (ref 150–400)
RBC: 4.36 MIL/uL (ref 4.22–5.81)
RDW: 16.9 % — ABNORMAL HIGH (ref 11.5–15.5)
WBC: 9.7 10*3/uL (ref 4.0–10.5)
nRBC: 0 % (ref 0.0–0.2)

## 2021-08-15 ENCOUNTER — Encounter: Payer: Self-pay | Admitting: Adult Health

## 2021-08-15 ENCOUNTER — Non-Acute Institutional Stay (SKILLED_NURSING_FACILITY): Payer: Medicare Other | Admitting: Adult Health

## 2021-08-15 DIAGNOSIS — E785 Hyperlipidemia, unspecified: Secondary | ICD-10-CM

## 2021-08-15 DIAGNOSIS — M5416 Radiculopathy, lumbar region: Secondary | ICD-10-CM

## 2021-08-15 DIAGNOSIS — N400 Enlarged prostate without lower urinary tract symptoms: Secondary | ICD-10-CM | POA: Diagnosis not present

## 2021-08-15 DIAGNOSIS — E43 Unspecified severe protein-calorie malnutrition: Secondary | ICD-10-CM | POA: Diagnosis not present

## 2021-08-15 DIAGNOSIS — Z66 Do not resuscitate: Secondary | ICD-10-CM | POA: Diagnosis not present

## 2021-08-15 NOTE — Progress Notes (Signed)
Location:  [facility] Room Number: 139-W Place of Service:  SNF (31)   CODE STATUS: DNR  No Known Allergies  Chief Complaint  Patient presents with   Medical Management of Chronic Issues        Protein calorie malnutrition:      BPH without urinary obstruction   Dyslipidemia:   Lumbar radiculopathy L5/S1    HPI:  He is a 87 year old long term resident of this facility being seen for the management of his chronic illnesses: Protein calorie malnutrition:      BPH without urinary obstruction   Dyslipidemia:   Lumbar radiculopathy L5/S1. There are no reports of uncontrolled pain; no reports of difficulty with urination; no reports of changes in appetite.   Past Medical History:  Diagnosis Date   A-fib (HCC)    Cellulitis    CHF (congestive heart failure) (HCC)    Hemorrhoid    Hypertension    Prostate disorder     Past Surgical History:  Procedure Laterality Date   BIOPSY  03/17/2020   Procedure: BIOPSY;  Surgeon: Corbin Ade, MD;  [facility]: [facility];  Service: Endoscopy;;  gastric rectal   ESOPHAGEAL DILATION N/A 03/17/2020   Procedure: ESOPHAGEAL DILATION;  Surgeon: Corbin Ade, MD;  [facility]: [facility];  Service: Endoscopy;  Laterality: N/A;   ESOPHAGOGASTRODUODENOSCOPY (EGD) WITH PROPOFOL N/A 03/17/2020   Procedure: ESOPHAGOGASTRODUODENOSCOPY (EGD) WITH PROPOFOL;  Surgeon: Corbin Ade, MD;  [facility]: [facility];  Service: Endoscopy;  Laterality: N/A;   FLEXIBLE SIGMOIDOSCOPY N/A 03/17/2020   Procedure: FLEXIBLE SIGMOIDOSCOPY WITH PROPOFOL;  Surgeon: Corbin Ade, MD;  [facility]: [facility];  Service: Endoscopy;  Laterality: N/A;   HEMORRHOID SURGERY     patient reports several hemorrhoid surgeries in the past    Social History   Socioeconomic History   Marital status: Legally Separated    Spouse name: Not on file   Number of children: Not on file   Years of education: Not on file   Highest education level: Not on file   Occupational History   Occupation: retired   Tobacco Use   Smoking status: Former   Smokeless tobacco: Never  Building services engineer Use: Never used  Substance and Sexual Activity   Alcohol use: Yes    Comment: "very little"   Drug use: Never   Sexual activity: Not Currently  Other Topics Concern   Not on file  Social History Narrative   On his third marriage x 18 years. Has 3 children and several grandchildren. His son lives in [place], [place]  and is primary contact. 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 .  Former smoker.    Long term resident of SNF        Social Determinants of Health   Financial Resource Strain: Not on file  Food Insecurity: Not on file  Transportation Needs: Not on file  Physical Activity: Not on file  Stress: Not on file  Social Connections: Not on file  Intimate Partner Violence: Not on file   Family History  Problem Relation Age of Onset   Heart disease Mother    Colon cancer Neg Hx       VITAL SIGNS BP 100/66   Pulse 62   Temp 98.3 F (36.8 C)   Resp 20   Ht 5\' 5"  (1.651 m)   Wt 174 lb 6.4 oz (79.1 kg)   SpO2 93%  BMI 29.02 kg/m   Outpatient Encounter Medications as of 08/15/2021  Medication Sig   acetaminophen (TYLENOL) 325 MG tablet Take 2 tablets (650 mg total) by mouth every 6 (six) hours as needed for mild pain, fever or headache (or Fever >/= 101).   albuterol (VENTOLIN HFA) 108 (90 Base) MCG/ACT inhaler Inhale 2 puffs into the lungs every 6 (six) hours as needed for wheezing or shortness of breath.   apixaban (ELIQUIS) 5 MG TABS tablet Take 1 tablet (5 mg total) by mouth 2 (two) times daily.   Balsam Peru-Castor Oil (VENELEX) OINT Apply 1 application topically in the morning, at noon, and at bedtime.   carboxymethylcellul-glycerin (OPTIVE) 0.5-0.9 % ophthalmic solution Place 1 drop into both eyes 4 (four) times daily. For dry eyes   cefTRIAXone (ROCEPHIN) 1 g injection Inject 1 g into the muscle daily. @ 2 pm X 10  days for multifocal pneumonia   Ensure (ENSURE) Take 237 mLs by mouth daily at 12 noon. For weight loss   fluticasone (FLONASE) 50 MCG/ACT nasal spray Place 1 spray into both nostrils 2 (two) times daily.   gabapentin (NEURONTIN) 100 MG capsule Take 1 capsule (100 mg total) by mouth at bedtime.   metoprolol succinate (TOPROL-XL) 25 MG 24 hr tablet Take 0.5 tablets (12.5 mg total) by mouth daily.   NON FORMULARY BiPap at setting of 20/16 while sleeping. Twice A Day   NON FORMULARY Diet: _____ Regular, __x____ NAS, _______Consistent Carbohydrate, _______NPO _____Other   OXYGEN Inhale 2 L into the lungs continuous.    pantoprazole (PROTONIX) 40 MG tablet Take 1 tablet (40 mg total) by mouth daily.   polyethylene glycol (MIRALAX / GLYCOLAX) 17 g packet Take 17 g by mouth daily.   sertraline (ZOLOFT) 50 MG tablet Take 50 mg by mouth daily.   sodium chloride (OCEAN) 0.65 % SOLN nasal spray Place 2 sprays into both nostrils every 6 (six) hours as needed (For Dry Stuffy Nose).   tamsulosin (FLOMAX) 0.4 MG CAPS capsule Take 1 capsule (0.4 mg total) by mouth daily after supper.   vitamin C (ASCORBIC ACID) 500 MG tablet Take 1,000 mg by mouth daily.   [DISCONTINUED] Miconazole Nitrate 2 % POWD 1 application by Does not apply route as needed. Special Instructions: every shift prn redness in skin folds, As Needed; PRN 1, PRN 2, PRN 3   [DISCONTINUED] Skin Protectants, Misc. (EUCERIN) cream Apply 1 application topically. Once a week on Wednesday. Special Instructions: Bilateral lower extremities- Clean legs with soap and water. Pat dry. Apply Eucerin cream to bilateral legs, apply kerlix, then apply coban wrap from toes to knees   No facility-administered encounter medications on file as of 08/15/2021.     SIGNIFICANT DIAGNOSTIC EXAMS   PREVIOUS  04-03-21: right hip x-ray: No acute bone abnormality. A follow-up study is recommended if patient's pain persists.  NO NEW EXAMS    LABS REVIEWED  PREVIOUS  11-10-20: wbc 7.3; hgb 13.7; hct 44.4; mcv 100.9 plt 156; glucose 75; bun 13; creat 0.73; k+ 3.8; na++ 149; ca 9.2 liver normal albumin 3.1 chol 83; ldl 40; trig 38; hdl 35; tsh 1.020 mag 1.9; vit D 52.71 01-24-21: wbc 8.4; hgb 15.4; hct 48.8; mcv 98.6 plt 174; glucose 91; bun 12; creat 0.64; k+ 4.0; na++ 142; ca 9.4 GFR>60  03-07-21: wbc 9.5; hgb 16.3; hct 51.8; mcv 99.2 plt 179 ;glucose 80; bun 15; creat 0.75; k+ 3.7; na++ 144; ca 9.4; GFR>60 03-09-21: urine culture no growth 04-05-21:hgb 12.4 hct  40.2 04-12-21: hgb 12.7; hct 40.5  TODAY  06-24-21: wbc 8.7; hgb 14.7; hct 47.8; mcv 103.0 plt 157; glucose 176; bun 13; creat 0.75; k+ 4.2; na++ 138; ca 9.0; GFR>60 d-dimer 0.31; CRP 1.0 08-07-21: wbc 9.7; hgb 13.9; hct 44.5; mcv 102.1 plt 111   Review of Systems  Constitutional:  Negative for malaise/fatigue.  Respiratory:  Negative for cough and shortness of breath.   Cardiovascular:  Negative for chest pain, palpitations and leg swelling.  Gastrointestinal:  Negative for abdominal pain, constipation and heartburn.  Musculoskeletal:  Negative for back pain, joint pain and myalgias.  Skin: Negative.   Neurological:  Negative for dizziness.  Psychiatric/Behavioral:  The patient is not nervous/anxious.      Physical Exam Constitutional:      General: He is not in acute distress.    Appearance: He is well-developed. He is not diaphoretic.  Neck:     Thyroid: No thyromegaly.  Cardiovascular:     Rate and Rhythm: Normal rate and regular rhythm.     Heart sounds: Normal heart sounds.  Pulmonary:     Effort: Pulmonary effort is normal. No respiratory distress.     Breath sounds: Normal breath sounds.  Abdominal:     General: Bowel sounds are normal. There is no distension.     Palpations: Abdomen is soft.     Tenderness: There is no abdominal tenderness.  Musculoskeletal:        General: Normal range of motion.     Cervical back: Neck supple.     Right lower leg: No edema.     Left  lower leg: No edema.  Lymphadenopathy:     Cervical: No cervical adenopathy.  Skin:    General: Skin is warm and dry.     Comments: Bilateral lower extremities discolored; legs are wrapped.    Neurological:     Mental Status: He is alert and oriented to person, place, and time.  Psychiatric:        Mood and Affect: Mood normal.      ASSESSMENT/ PLAN:   TODAY  Protein calorie malnutrition: is stable albumin 3.1 will continue supplements as directed  2. BPH without urinary obstruction is stable will continue flomax daily   4. Dyslipidemia: is stable LDL 40 trig 38 is off medications  4. Lumbar radiculopathy L5/S1 will continue gabapentin 100 mg nightly   PREVIOUS    5. Atrial fibrillation chronic: is stable will continue toprol xl 12.5 mg daily for rate control and eliquis 5 mg twice daily   6. Acute on chronic heart failure with preserved ejection fracture diastolic dysfunction: is compensated will continue toprol xl 12.5 mg daily   7. OSA is on CPAP is stable will continue cpap nightly   8. Aortic atherosclerosis (03-14-20 ct) will monitor   9. GERD without esophagitis: is stable will continue protonix 40 mg daily   10. Chronic constipation: is stable will continue miralax twice daily and senna s 2 tabs daily   11. Chronic venous insufficiency / venous stasis dermatis bilateral lower extremity: is stable bilateral lower extremities wrapped.        Synthia Innocent NP [facility]  Contact [phone] Monday through Friday 8am- 5pm  After hours call [phone]

## 2021-08-18 ENCOUNTER — Inpatient Hospital Stay (HOSPITAL_COMMUNITY): Payer: Medicare Other | Attending: Adult Health

## 2021-08-18 ENCOUNTER — Other Ambulatory Visit (HOSPITAL_COMMUNITY)
Admission: RE | Admit: 2021-08-18 | Discharge: 2021-08-18 | Disposition: A | Discharge status: Home / Self Care | Payer: Medicare Other | Source: Skilled Nursing Facility | Attending: Adult Health | Admitting: Adult Health

## 2021-08-18 ENCOUNTER — Non-Acute Institutional Stay (SKILLED_NURSING_FACILITY): Payer: Medicare Other | Admitting: Adult Health

## 2021-08-18 ENCOUNTER — Encounter: Payer: Self-pay | Admitting: Adult Health

## 2021-08-18 DIAGNOSIS — R0602 Shortness of breath: Secondary | ICD-10-CM | POA: Diagnosis not present

## 2021-08-18 DIAGNOSIS — R0989 Other specified symptoms and signs involving the circulatory and respiratory systems: Secondary | ICD-10-CM | POA: Diagnosis not present

## 2021-08-18 DIAGNOSIS — J189 Pneumonia, unspecified organism: Secondary | ICD-10-CM | POA: Diagnosis not present

## 2021-08-18 DIAGNOSIS — I517 Cardiomegaly: Secondary | ICD-10-CM | POA: Diagnosis not present

## 2021-08-18 LAB — COMPREHENSIVE METABOLIC PANEL
ALT: 20 U/L (ref 0–44)
AST: 28 U/L (ref 15–41)
Albumin: 3 g/dL — ABNORMAL LOW (ref 3.5–5.0)
Alkaline Phosphatase: 81 U/L (ref 38–126)
Anion gap: 3 — ABNORMAL LOW (ref 5–15)
BUN: 13 mg/dL (ref 8–23)
CO2: 40 mmol/L — ABNORMAL HIGH (ref 22–32)
Calcium: 8.8 mg/dL — ABNORMAL LOW (ref 8.9–10.3)
Chloride: 98 mmol/L (ref 98–111)
Creatinine, Ser: 0.66 mg/dL (ref 0.61–1.24)
GFR, Estimated: >60 mL/min (ref >60)
Glucose, Bld: 94 mg/dL (ref 70–99)
Potassium: 4.1 mmol/L (ref 3.5–5.1)
Sodium: 141 mmol/L (ref 135–145)
Total Bilirubin: 1.7 mg/dL — ABNORMAL HIGH (ref 0.3–1.2)
Total Protein: 6.1 g/dL — ABNORMAL LOW (ref 6.5–8.1)

## 2021-08-18 LAB — CBC WITH DIFFERENTIAL/PLATELET
Abs Immature Granulocytes: 0.07 10*3/uL (ref 0.00–0.07)
Basophils Absolute: 0.1 10*3/uL (ref 0.0–0.1)
Basophils Relative: 1 %
Eosinophils Absolute: 0.2 10*3/uL (ref 0.0–0.5)
Eosinophils Relative: 2 %
HCT: 42.2 % (ref 39.0–52.0)
Hemoglobin: 13.2 g/dL (ref 13.0–17.0)
Immature Granulocytes: 1 %
Lymphocytes Relative: 10 %
Lymphs Abs: 1 10*3/uL (ref 0.7–4.0)
MCH: 31.9 pg (ref 26.0–34.0)
MCHC: 31.3 g/dL (ref 30.0–36.0)
MCV: 101.9 fL — ABNORMAL HIGH (ref 80.0–100.0)
Monocytes Absolute: 0.9 10*3/uL (ref 0.1–1.0)
Monocytes Relative: 9 %
Neutro Abs: 7.9 10*3/uL — ABNORMAL HIGH (ref 1.7–7.7)
Neutrophils Relative %: 77 %
Platelets: 129 10*3/uL — ABNORMAL LOW (ref 150–400)
RBC: 4.14 MIL/uL — ABNORMAL LOW (ref 4.22–5.81)
RDW: 17.4 % — ABNORMAL HIGH (ref 11.5–15.5)
WBC: 10.1 10*3/uL (ref 4.0–10.5)
nRBC: 0 % (ref 0.0–0.2)

## 2021-08-18 NOTE — Progress Notes (Signed)
Location:  [facility] Room Number: 139-W Place of Service:  SNF (31)   CODE STATUS: DNR/DNI  No Known Allergies  Chief Complaint  Patient presents with   Acute Visit    Follow-up on chest x-ray.    HPI:  He is more lethargic. He is cough; increased shortness of breath; lowered 02 sats on room air. His x-ray demonstrates right lob pneumonia. There are no reports of fevers. He is declining overall.   Past Medical History:  Diagnosis Date   A-fib (HCC)    Cellulitis    CHF (congestive heart failure) (HCC)    Hemorrhoid    Hypertension    Prostate disorder     Past Surgical History:  Procedure Laterality Date   BIOPSY  03/17/2020   Procedure: BIOPSY;  Surgeon: Corbin Ade, MD;  [facility];  Service: Endoscopy;;  gastric rectal   ESOPHAGEAL DILATION N/A 03/17/2020   Procedure: ESOPHAGEAL DILATION;  Surgeon: Corbin Ade, MD;  [facility];  Service: Endoscopy;  Laterality: N/A;   ESOPHAGOGASTRODUODENOSCOPY (EGD) WITH PROPOFOL N/A 03/17/2020   Procedure: ESOPHAGOGASTRODUODENOSCOPY (EGD) WITH PROPOFOL;  Surgeon: Corbin Ade, MD;  [facility];  Service: Endoscopy;  Laterality: N/A;   FLEXIBLE SIGMOIDOSCOPY N/A 03/17/2020   Procedure: FLEXIBLE SIGMOIDOSCOPY WITH PROPOFOL;  Surgeon: Corbin Ade, MD;  [facility];  Service: Endoscopy;  Laterality: N/A;   HEMORRHOID SURGERY     patient reports several hemorrhoid surgeries in the past    Social History   Socioeconomic History   Marital status: Legally Separated    Spouse name: Not on file   Number of children: Not on file   Years of education: Not on file   Highest education level: Not on file  Occupational History   Occupation: retired   Tobacco Use   Smoking status: Former   Smokeless tobacco: Never  Building services engineer Use: Never used  Substance and Sexual Activity   Alcohol use: Yes    Comment: "very little"   Drug use: Never    Sexual activity: Not Currently  Other Topics Concern   Not on file  Social History Narrative   On his third marriage x 18 years. Has 3 children and several grandchildren. His son lives in [place], [place]  and is primary contact. 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 .  Former smoker.    Long term resident of SNF        Social Determinants of Health   Financial Resource Strain: Not on file  Food Insecurity: Not on file  Transportation Needs: Not on file  Physical Activity: Not on file  Stress: Not on file  Social Connections: Not on file  Intimate Partner Violence: Not on file   Family History  Problem Relation Age of Onset   Heart disease Mother    Colon cancer Neg Hx       VITAL SIGNS BP 100/66   Pulse 62   Temp 98.6 F (37 C)   Resp 20   Ht 5\' 5"  (1.651 m)   Wt 174 lb 6.4 oz (79.1 kg)   SpO2 94%   BMI 29.02 kg/m   Outpatient Encounter Medications as of 08/18/2021  Medication Sig   acetaminophen (TYLENOL) 325 MG tablet Take 2 tablets (650 mg total) by mouth every 6 (six) hours as needed for mild pain, fever or headache (or Fever >/= 101).   albuterol (  VENTOLIN HFA) 108 (90 Base) MCG/ACT inhaler Inhale 2 puffs into the lungs every 6 (six) hours as needed for wheezing or shortness of breath.   amoxicillin-clavulanate (AUGMENTIN) 875-125 MG tablet Take 1 tablet by mouth 2 (two) times daily. for right lobe pneumonia   apixaban (ELIQUIS) 5 MG TABS tablet Take 1 tablet (5 mg total) by mouth 2 (two) times daily.   Balsam Peru-Castor Oil (VENELEX) OINT Apply 1 application topically in the morning, at noon, and at bedtime.   carboxymethylcellul-glycerin (OPTIVE) 0.5-0.9 % ophthalmic solution Place 1 drop into both eyes 4 (four) times daily. For dry eyes   Ensure (ENSURE) Take 237 mLs by mouth daily at 12 noon. For weight loss   fluticasone (FLONASE) 50 MCG/ACT nasal spray Place 1 spray into both nostrils 2 (two) times daily.   gabapentin (NEURONTIN) 100 MG  capsule Take 1 capsule (100 mg total) by mouth at bedtime.   metoprolol succinate (TOPROL-XL) 25 MG 24 hr tablet Take 0.5 tablets (12.5 mg total) by mouth daily.   NON FORMULARY BiPap at setting of 20/16 while sleeping. Twice A Day   NON FORMULARY Diet: _____ Regular, __x____ NAS, _______Consistent Carbohydrate, _______NPO _____Other   OXYGEN Inhale 2 L into the lungs continuous.    pantoprazole (PROTONIX) 40 MG tablet Take 1 tablet (40 mg total) by mouth daily.   polyethylene glycol (MIRALAX / GLYCOLAX) 17 g packet Take 17 g by mouth daily.   sertraline (ZOLOFT) 25 MG tablet Take 25 mg by mouth daily. administer with 50mg  to = 75mg    sertraline (ZOLOFT) 50 MG tablet Take 50 mg by mouth daily. give with 25mg  tab to = 75 mg   sodium chloride (OCEAN) 0.65 % SOLN nasal spray Place 2 sprays into both nostrils every 6 (six) hours as needed (For Dry Stuffy Nose).   tamsulosin (FLOMAX) 0.4 MG CAPS capsule Take 1 capsule (0.4 mg total) by mouth daily after supper.   vitamin C (ASCORBIC ACID) 500 MG tablet Take 1,000 mg by mouth daily.   No facility-administered encounter medications on file as of 08/18/2021.     SIGNIFICANT DIAGNOSTIC EXAMS   PREVIOUS  04-03-21: right hip x-ray: No acute bone abnormality. A follow-up study is recommended if patient's pain persists.  TODAY  08-18-21: chest x-ray:  Right upper lobe airspace disease concerning for pneumonia. Unchanged elevated right hemidiaphragm.  Unchanged cardiomegaly.   LABS REVIEWED PREVIOUS  11-10-20: wbc 7.3; hgb 13.7; hct 44.4; mcv 100.9 plt 156; glucose 75; bun 13; creat 0.73; k+ 3.8; na++ 149; ca 9.2 liver normal albumin 3.1 chol 83; ldl 40; trig 38; hdl 35; tsh 1.020 mag 1.9; vit D 52.71 01-24-21: wbc 8.4; hgb 15.4; hct 48.8; mcv 98.6 plt 174; glucose 91; bun 12; creat 0.64; k+ 4.0; na++ 142; ca 9.4 GFR>60  03-07-21: wbc 9.5; hgb 16.3; hct 51.8; mcv 99.2 plt 179 ;glucose 80; bun 15; creat 0.75; k+ 3.7; na++ 144; ca 9.4;  GFR>60 03-09-21: urine culture no growth 04-05-21:hgb 12.4 hct 40.2 04-12-21: hgb 12.7; hct 40.5 06-24-21: wbc 8.7; hgb 14.7; hct 47.8; mcv 103.0 plt 157; glucose 176; bun 13; creat 0.75; k+ 4.2; na++ 138; ca 9.0; GFR>60 d-dimer 0.31; CRP 1.0 08-07-21: wbc 9.7; hgb 13.9; hct 44.5; mcv 102.1 plt 111   TODAY  08-18-21: wbc 10.1; hgb 13.2; hct 42.2; mcv 101.9 plt 129; glucose 94; bun 13; creat 0.66; k+ 4.1; na++ 141; ca 8.8; GFR >60; liver normal albumin 3.0   Review of Systems  Unable to perform  ROS: Medical condition (unable to participate)    Physical Exam Constitutional:      General: He is not in acute distress.    Appearance: He is well-developed. He is not diaphoretic.  Neck:     Thyroid: No thyromegaly.  Cardiovascular:     Rate and Rhythm: Tachycardia present. Rhythm irregular.     Pulses: Normal pulses.     Heart sounds: Normal heart sounds.  Pulmonary:     Effort: Pulmonary effort is normal. No respiratory distress.     Breath sounds: Wheezing, rhonchi and rales present.     Comments: On 02  Abdominal:     General: Bowel sounds are normal. There is no distension.     Palpations: Abdomen is soft.     Tenderness: There is no abdominal tenderness.  Musculoskeletal:     Cervical back: Neck supple.     Right lower leg: No edema.     Left lower leg: No edema.  Lymphadenopathy:     Cervical: No cervical adenopathy.  Skin:    General: Skin is warm and dry.     Comments: Lower extremities wrapped   Neurological:     Mental Status: He is lethargic.     Comments: Lethargic      ASSESSMENT/ PLAN:  TODAY  HCAP: is worse: will begin augmentin 875 mg twice daily through 08-29-21. Will have him use flutter valve four times daily will monitor his status.    Synthia Innocent NP [facility]  Contact [phone] Monday through Friday 8am- 5pm  After hours call [phone]

## 2021-08-21 ENCOUNTER — Non-Acute Institutional Stay (SKILLED_NURSING_FACILITY): Payer: Medicare Other | Admitting: Adult Health

## 2021-08-21 ENCOUNTER — Encounter: Payer: Self-pay | Admitting: Adult Health

## 2021-08-21 DIAGNOSIS — R627 Adult failure to thrive: Secondary | ICD-10-CM | POA: Diagnosis not present

## 2021-08-21 DIAGNOSIS — J189 Pneumonia, unspecified organism: Secondary | ICD-10-CM

## 2021-08-21 DIAGNOSIS — I5032 Chronic diastolic (congestive) heart failure: Secondary | ICD-10-CM

## 2021-08-21 IMAGING — CT CT ANGIO CHEST
2 of 7 series · 18 of 46 positions shown · IV contrast (Omnipaque or Isovue)
Comparison: Chest x-ray from earlier in the same day, CT from
04/18/2004.

CLINICAL DATA: Hypoxia and peripheral edema

EXAM:
CT ANGIOGRAPHY CHEST WITH CONTRAST
TECHNIQUE: Multidetector CT imaging of the chest was performed using the
standard protocol during bolus administration of intravenous
contrast. Multiplanar CT image reconstructions and MIPs were
obtained to evaluate the vascular anatomy.
CONTRAST:  100mL OMNIPAQUE IOHEXOL 350 MG/ML SOLN

[Series 5: pe axial thins · axial · 0.74mm/px · z∈[+1282,+1555]mm · 15 of 311 slices shown]
[im 19/311  lung]
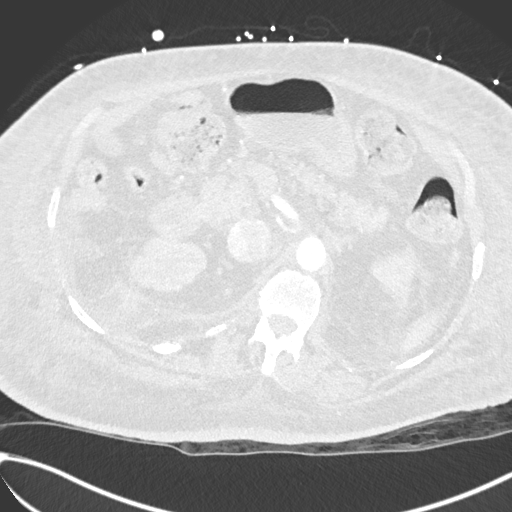
[im 37/311  soft-tissue]
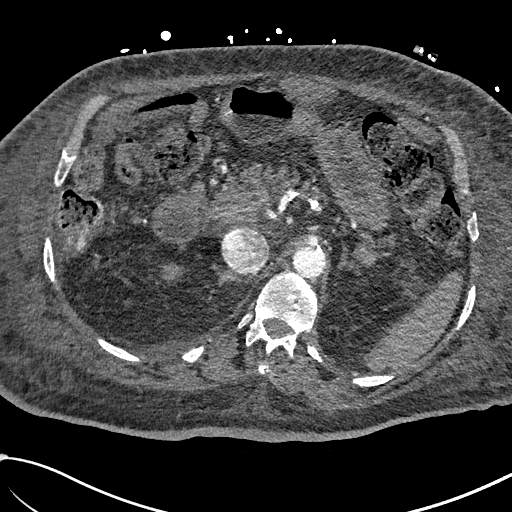
[im 55/311  lung]
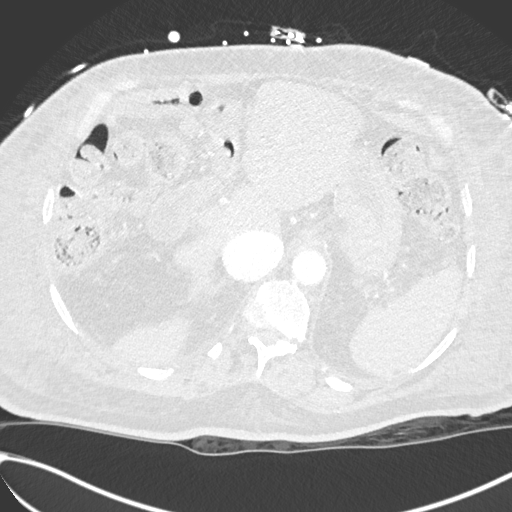
[im 73/311  soft-tissue]
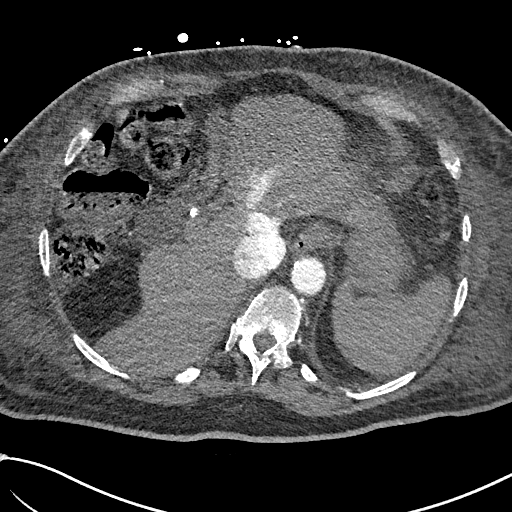
[im 92/311  lung]
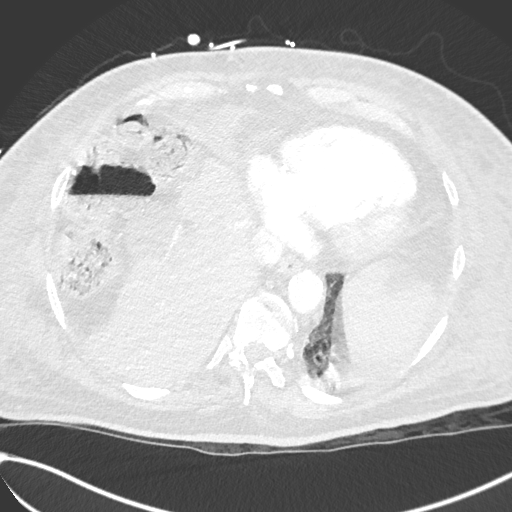
[im 110/311  soft-tissue]
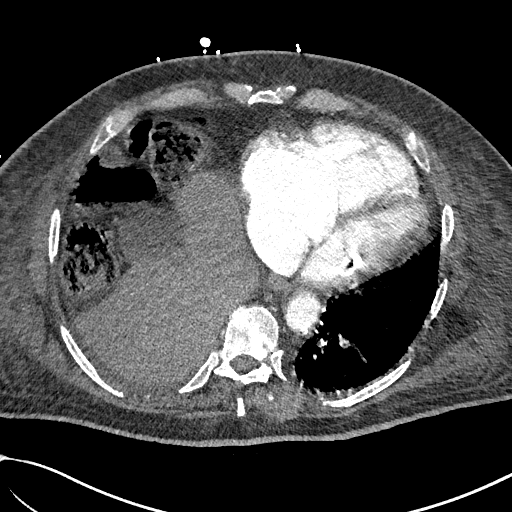
[im 128/311  lung]
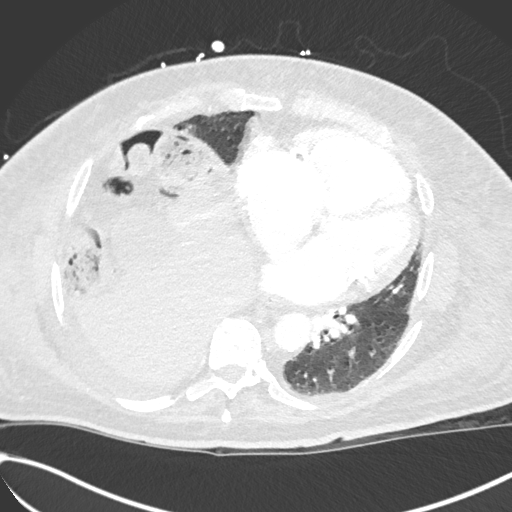
[im 165/311  soft-tissue]
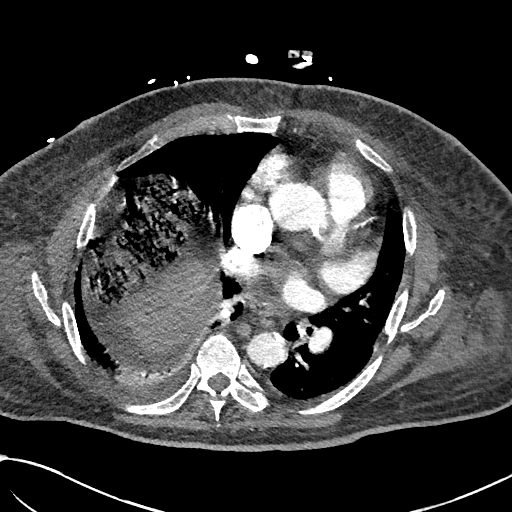
[im 183/311  lung]
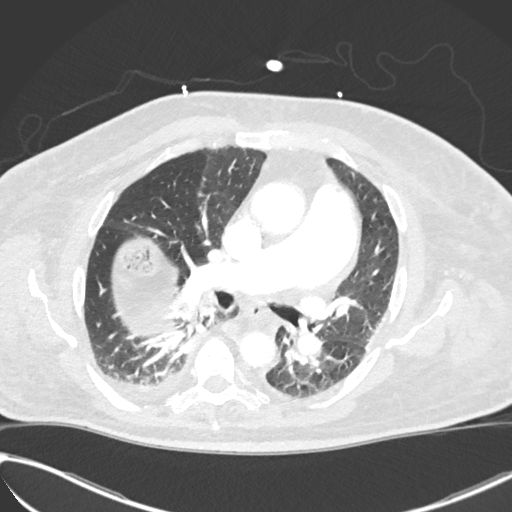
[im 201/311  soft-tissue]
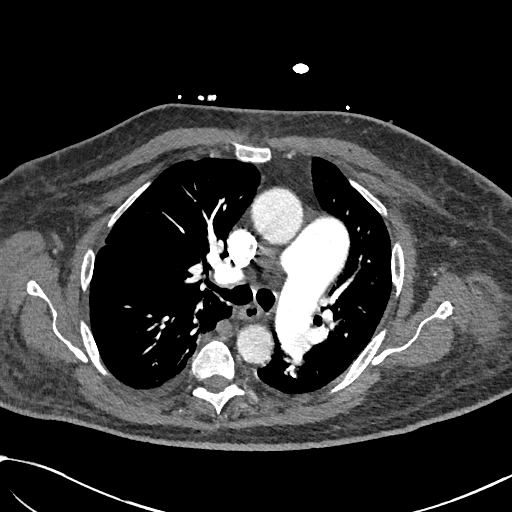
[im 219/311  lung]
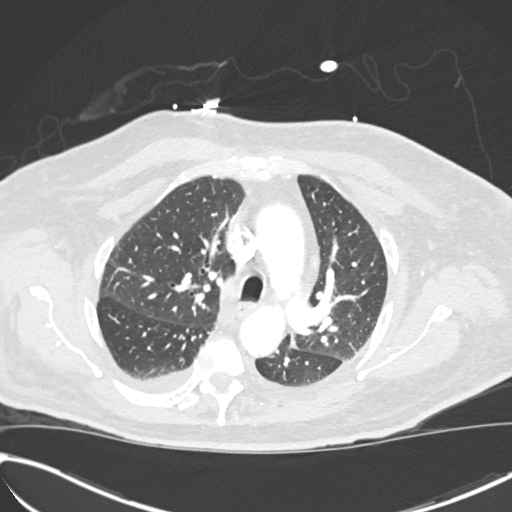
[im 238/311  soft-tissue]
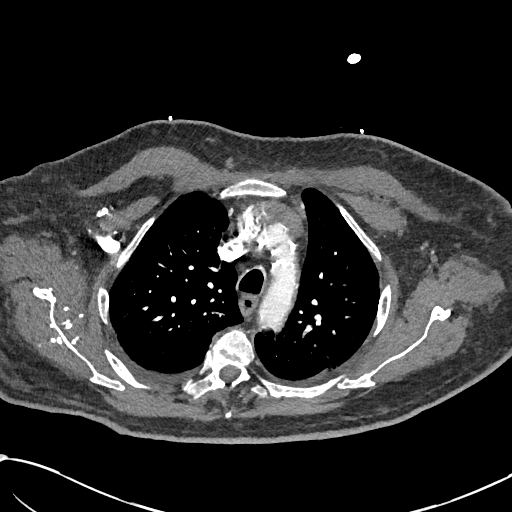
[im 256/311  lung]
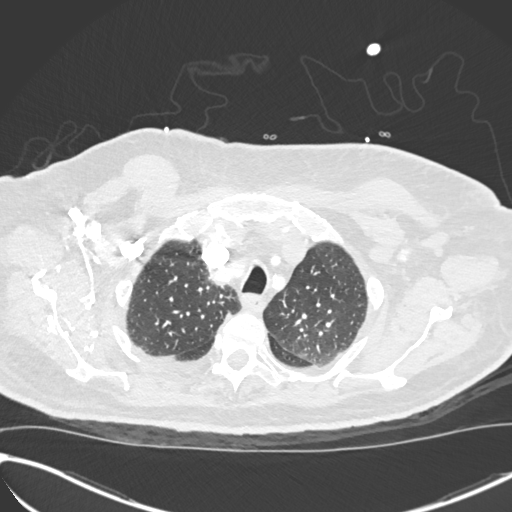
[im 274/311  soft-tissue]
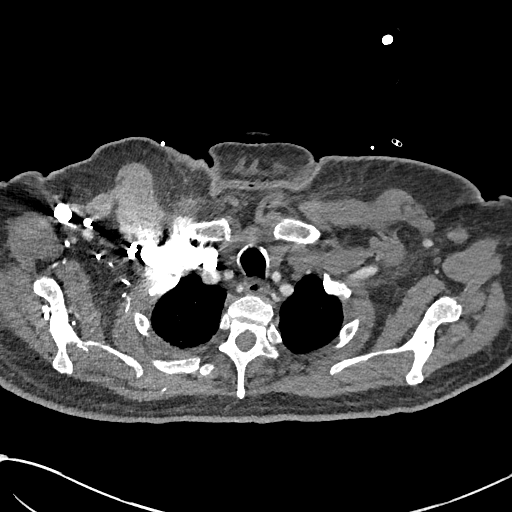
[im 292/311  lung]
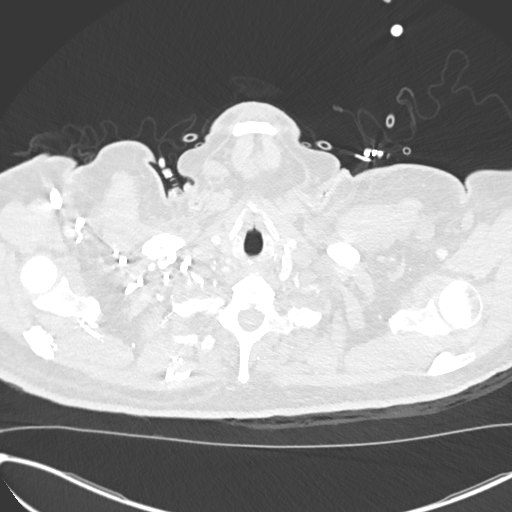

[Series 7: cor soft · coronal · 0.63mm/px · 3 of 159 slices shown]
[im 40/159  soft-tissue]
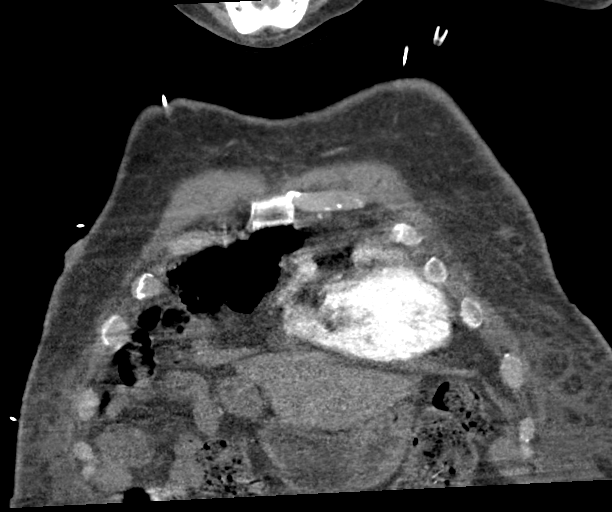
[im 80/159  soft-tissue]
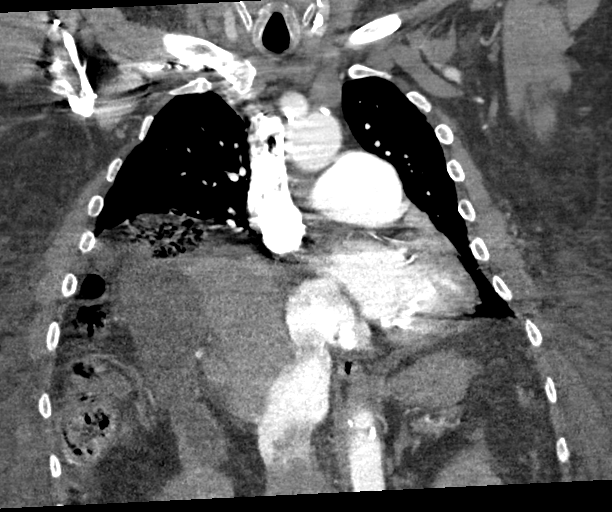
[im 119/159  soft-tissue]
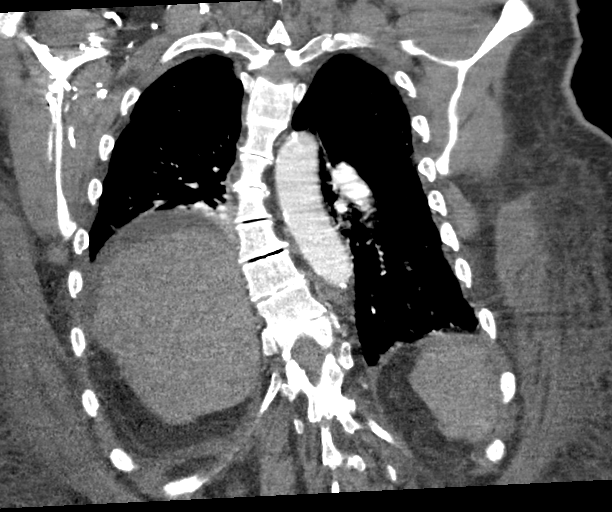

[18 of 46 positions shown; findings below may reference images not displayed]

FINDINGS: Cardiovascular: Thoracic aorta and its branches demonstrate
atherosclerotic calcifications. No aneurysmal dilatation or
dissection is seen. Coronary calcifications are noted. No
significant cardiac enlargement is seen. The pulmonary artery shows
a normal branching pattern without intraluminal filling defect to
suggest pulmonary embolism.

Mediastinum/Nodes: Thoracic inlet is within normal limits. No hilar
or mediastinal adenopathy is noted. The esophagus as visualized is
within normal limits.

Lungs/Pleura: Lungs are well aerated bilaterally. A 5 mm nodule is
noted in the medial aspect of the left upper lobe best seen on image
number 21 of series 6. This has increased in size from the prior
exam sixteen years previous at which time it measured 1-2 mm
elevation of the right hemidiaphragm is seen. This is stable in
appearance from the prior exam. No other nodules are seen. Small
right pleural effusion is noted with right basilar atelectasis.

Upper Abdomen: Visualized abdomen shows no acute abnormality.

Musculoskeletal: Changes of anasarca are noted peripherally
consistent with that described in the lower extremities.
Degenerative changes of the thoracic spine are seen. No acute
compression deformity is noted.

Review of the MIP images confirms the above findings.
IMPRESSION: No evidence of pulmonary emboli.

Small right pleural effusion with right basilar atelectasis.

5 mm nodule in the left upper lobe increased in size from 16 years
ago at which time it measured 1-2 mm. No follow-up needed if patient
is low-risk. Non-contrast chest CT can be considered in 12 months if
patient is high-risk. This recommendation follows the consensus
statement: Guidelines for Management of Incidental Pulmonary Nodules
Detected on CT Images: From the [HOSPITAL] 6826; Radiology
6826; [DATE].

Changes of anasarca.

Aortic Atherosclerosis ([id number]-[id number].Z).

## 2021-08-21 NOTE — Progress Notes (Signed)
Location:  [facility] Room Number: 139-W Place of Service:  SNF (31)   CODE STATUS: DNR/DNI   No Known Allergies  Chief Complaint  Patient presents with   Acute Visit    Change in status    HPI:  He is continuing to decline. He did not take his oral abt this AM. He is unwilling to wear his CPAP. There are no reports of fevers. Staff is concerned about his pneumonia and will need IM abt. He is not verbally responding; but will open eyes when addressed.   Past Medical History:  Diagnosis Date   A-fib (HCC)    Cellulitis    CHF (congestive heart failure) (HCC)    Hemorrhoid    Hypertension    Prostate disorder     Past Surgical History:  Procedure Laterality Date   BIOPSY  03/17/2020   Procedure: BIOPSY;  Surgeon: Corbin Ade, MD;  [facility]: [facility];  Service: Endoscopy;;  gastric rectal   ESOPHAGEAL DILATION N/A 03/17/2020   Procedure: ESOPHAGEAL DILATION;  Surgeon: Corbin Ade, MD;  [facility]: [facility];  Service: Endoscopy;  Laterality: N/A;   ESOPHAGOGASTRODUODENOSCOPY (EGD) WITH PROPOFOL N/A 03/17/2020   Procedure: ESOPHAGOGASTRODUODENOSCOPY (EGD) WITH PROPOFOL;  Surgeon: Corbin Ade, MD;  [facility]: [facility];  Service: Endoscopy;  Laterality: N/A;   FLEXIBLE SIGMOIDOSCOPY N/A 03/17/2020   Procedure: FLEXIBLE SIGMOIDOSCOPY WITH PROPOFOL;  Surgeon: Corbin Ade, MD;  [facility]: [facility];  Service: Endoscopy;  Laterality: N/A;   HEMORRHOID SURGERY     patient reports several hemorrhoid surgeries in the past    Social History   Socioeconomic History   Marital status: Legally Separated    Spouse name: Not on file   Number of children: Not on file   Years of education: Not on file   Highest education level: Not on file  Occupational History   Occupation: retired   Tobacco Use   Smoking status: Former   Smokeless tobacco: Never  Building services engineer Use: Never used  Substance and Sexual Activity   Alcohol use:  Yes    Comment: "very little"   Drug use: Never   Sexual activity: Not Currently  Other Topics Concern   Not on file  Social History Narrative   On his third marriage x 18 years. Has 3 children and several grandchildren. His son lives in [place], [place]  and is primary contact. 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 .  Former smoker.    Long term resident of SNF        Social Determinants of Health   Financial Resource Strain: Not on file  Food Insecurity: Not on file  Transportation Needs: Not on file  Physical Activity: Not on file  Stress: Not on file  Social Connections: Not on file  Intimate Partner Violence: Not on file   Family History  Problem Relation Age of Onset   Heart disease Mother    Colon cancer Neg Hx       VITAL SIGNS BP 117/62   Pulse (!) 104   Temp 98.4 F (36.9 C)   Resp 20   Ht 5\' 5"  (1.651 m)   Wt 174 lb 6.4 oz (79.1 kg)   SpO2 92%   BMI 29.02 kg/m   Outpatient Encounter Medications as of 08/21/2021  Medication Sig   acetaminophen (TYLENOL) 325 MG tablet Take 2 tablets (650 mg total) by mouth every 6 (  six) hours as needed for mild pain, fever or headache (or Fever >/= 101).   albuterol (VENTOLIN HFA) 108 (90 Base) MCG/ACT inhaler Inhale 2 puffs into the lungs every 6 (six) hours as needed for wheezing or shortness of breath.   amoxicillin-clavulanate (AUGMENTIN) 875-125 MG tablet Take 1 tablet by mouth 2 (two) times daily. for right lobe pneumonia   apixaban (ELIQUIS) 5 MG TABS tablet Take 1 tablet (5 mg total) by mouth 2 (two) times daily.   Balsam [place]-Castor Oil (VENELEX) OINT Apply 1 application topically in the morning, at noon, and at bedtime.   carboxymethylcellul-glycerin (OPTIVE) 0.5-0.9 % ophthalmic solution Place 1 drop into both eyes 4 (four) times daily. For dry eyes   Ensure (ENSURE) Take 237 mLs by mouth daily at 12 noon. For weight loss   fluticasone (FLONASE) 50 MCG/ACT nasal spray Place 1 spray into both nostrils  2 (two) times daily.   gabapentin (NEURONTIN) 100 MG capsule Take 1 capsule (100 mg total) by mouth at bedtime.   metoprolol succinate (TOPROL-XL) 25 MG 24 hr tablet Take 0.5 tablets (12.5 mg total) by mouth daily.   NON FORMULARY BiPap at setting of 20/16 while sleeping. Twice A Day   NON FORMULARY Diet: _____ Regular, __x____ NAS, _______Consistent Carbohydrate, _______NPO _____Other   OXYGEN Inhale 2 L into the lungs continuous. May titrate Oxygen from 3 to 4L/mn via [place] for sats below 80 Marga Melnick MD As Needed   pantoprazole (PROTONIX) 40 MG tablet Take 1 tablet (40 mg total) by mouth daily.   polyethylene glycol (MIRALAX / GLYCOLAX) 17 g packet Take 17 g by mouth daily.   sertraline (ZOLOFT) 25 MG tablet Take 25 mg by mouth daily. administer with 50mg  to = 75mg    sertraline (ZOLOFT) 50 MG tablet Take 50 mg by mouth daily. give with 25mg  tab to = 75 mg   sodium chloride (OCEAN) 0.65 % SOLN nasal spray Place 2 sprays into both nostrils every 6 (six) hours as needed (For Dry Stuffy Nose).   tamsulosin (FLOMAX) 0.4 MG CAPS capsule Take 1 capsule (0.4 mg total) by mouth daily after supper.   vitamin C (ASCORBIC ACID) 500 MG tablet Take 1,000 mg by mouth daily.   No facility-administered encounter medications on file as of 08/21/2021.     SIGNIFICANT DIAGNOSTIC EXAMS   PREVIOUS  04-03-21: right hip x-ray: No acute bone abnormality. A follow-up study is recommended if patient's pain persists.  TODAY  08-18-21: chest x-ray:  Right upper lobe airspace disease concerning for pneumonia. Unchanged elevated right hemidiaphragm.  Unchanged cardiomegaly.   LABS REVIEWED PREVIOUS  11-10-20: wbc 7.3; hgb 13.7; hct 44.4; mcv 100.9 plt 156; glucose 75; bun 13; creat 0.73; k+ 3.8; na++ 149; ca 9.2 liver normal albumin 3.1 chol 83; ldl 40; trig 38; hdl 35; tsh 1.020 mag 1.9; vit D 52.71 01-24-21: wbc 8.4; hgb 15.4; hct 48.8; mcv 98.6 plt 174; glucose 91; bun 12; creat 0.64; k+ 4.0; na++ 142;  ca 9.4 GFR>60  03-07-21: wbc 9.5; hgb 16.3; hct 51.8; mcv 99.2 plt 179 ;glucose 80; bun 15; creat 0.75; k+ 3.7; na++ 144; ca 9.4; GFR>60 03-09-21: urine culture no growth 04-05-21:hgb 12.4 hct 40.2 04-12-21: hgb 12.7; hct 40.5 06-24-21: wbc 8.7; hgb 14.7; hct 47.8; mcv 103.0 plt 157; glucose 176; bun 13; creat 0.75; k+ 4.2; na++ 138; ca 9.0; GFR>60 d-dimer 0.31; CRP 1.0 08-07-21: wbc 9.7; hgb 13.9; hct 44.5; mcv 102.1 plt 111  08-18-21: wbc 10.1; hgb 13.2; hct 42.2;  mcv 101.9 plt 129; glucose 94; bun 13; creat 0.66; k+ 4.1; na++ 141; ca 8.8; GFR >60; liver normal albumin 3.0  NO NEW LABS.   Review of Systems  Reason unable to perform ROS: lethargy.    Physical Exam Constitutional:      General: He is not in acute distress.    Appearance: He is well-developed. He is not diaphoretic.  Neck:     Thyroid: No thyromegaly.  Cardiovascular:     Rate and Rhythm: Tachycardia present. Rhythm irregular.     Pulses: Normal pulses.     Heart sounds: Normal heart sounds.  Pulmonary:     Effort: Pulmonary effort is normal. No respiratory distress.     Breath sounds: Wheezing and rhonchi present.     Comments: On 02  Abdominal:     General: Bowel sounds are normal. There is no distension.     Palpations: Abdomen is soft.     Tenderness: There is no abdominal tenderness.  Musculoskeletal:     Cervical back: Neck supple.     Right lower leg: No edema.     Left lower leg: No edema.  Lymphadenopathy:     Cervical: No cervical adenopathy.  Skin:    General: Skin is warm and dry.     Comments: Lower extremities discolored   Neurological:     Mental Status: He is lethargic.      ASSESSMENT/ PLAN:  TODAY  HCAP (health care associated pneumonia) Chronic diastolic congestive heart failure Failure to thrive in adult  Will change to IM rocephin 1 gm daily for 10 days and will monitor his status.  More than likely this does represent a terminal state for him.    Synthia Innocent NP [facility]  Contact [phone] Monday through Friday 8am- 5pm  After hours call [phone]

## 2021-08-22 ENCOUNTER — Non-Acute Institutional Stay (SKILLED_NURSING_FACILITY): Payer: Medicare Other | Admitting: Adult Health

## 2021-08-22 ENCOUNTER — Other Ambulatory Visit: Payer: Self-pay | Admitting: Adult Health

## 2021-08-22 ENCOUNTER — Encounter: Payer: Self-pay | Admitting: Adult Health

## 2021-08-22 DIAGNOSIS — R627 Adult failure to thrive: Secondary | ICD-10-CM

## 2021-08-22 DIAGNOSIS — J189 Pneumonia, unspecified organism: Secondary | ICD-10-CM | POA: Diagnosis not present

## 2021-08-22 DIAGNOSIS — I5032 Chronic diastolic (congestive) heart failure: Secondary | ICD-10-CM

## 2021-08-22 NOTE — Progress Notes (Signed)
Location:  [facility] Room Number: 139 Place of Service:  SNF (31)   CODE STATUS: dnr  No Known Allergies  Chief Complaint  Patient presents with   Acute Visit    Change in status.     HPI:  He is no longer responsive to all stimuli. His respirations are short and shallow. There is no cyanosis present. The staff feel that hospice should be called at this time.   Past Medical History:  Diagnosis Date   A-fib (HCC)    Cellulitis    CHF (congestive heart failure) (HCC)    Hemorrhoid    Hypertension    Prostate disorder     Past Surgical History:  Procedure Laterality Date   BIOPSY  03/17/2020   Procedure: BIOPSY;  Surgeon: Corbin Ade, MD;  [facility];  Service: Endoscopy;;  gastric rectal   ESOPHAGEAL DILATION N/A 03/17/2020   Procedure: ESOPHAGEAL DILATION;  Surgeon: Corbin Ade, MD;  [facility];  Service: Endoscopy;  Laterality: N/A;   ESOPHAGOGASTRODUODENOSCOPY (EGD) WITH PROPOFOL N/A 03/17/2020   Procedure: ESOPHAGOGASTRODUODENOSCOPY (EGD) WITH PROPOFOL;  Surgeon: Corbin Ade, MD;  [facility];  Service: Endoscopy;  Laterality: N/A;   FLEXIBLE SIGMOIDOSCOPY N/A 03/17/2020   Procedure: FLEXIBLE SIGMOIDOSCOPY WITH PROPOFOL;  Surgeon: Corbin Ade, MD;  [facility];  Service: Endoscopy;  Laterality: N/A;   HEMORRHOID SURGERY     patient reports several hemorrhoid surgeries in the past    Social History   Socioeconomic History   Marital status: Legally Separated    Spouse name: Not on file   Number of children: Not on file   Years of education: Not on file   Highest education level: Not on file  Occupational History   Occupation: retired   Tobacco Use   Smoking status: Former   Smokeless tobacco: Never  Building services engineer Use: Never used  Substance and Sexual Activity   Alcohol use: Yes    Comment: "very little"   Drug use: Never   Sexual activity: Not Currently  Other  Topics Concern   Not on file  Social History Narrative   On his third marriage x 18 years. Has 3 children and several grandchildren. His son lives in [place], [place]  and is primary contact. 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 .  Former smoker.    Long term resident of SNF        Social Determinants of Health   Financial Resource Strain: Not on file  Food Insecurity: Not on file  Transportation Needs: Not on file  Physical Activity: Not on file  Stress: Not on file  Social Connections: Not on file  Intimate Partner Violence: Not on file   Family History  Problem Relation Age of Onset   Heart disease Mother    Colon cancer Neg Hx       VITAL SIGNS BP 117/62   Pulse (!) 104   Temp (!) 97.2 F (36.2 C)   Resp 18   Ht 5\' 5"  (1.651 m)   Wt 174 lb 6.4 oz (79.1 kg)   SpO2 90%   BMI 29.02 kg/m   Outpatient Encounter Medications as of 08/22/2021  Medication Sig   acetaminophen (TYLENOL) 325 MG tablet Take 2 tablets (650 mg total) by mouth every 6 (six) hours as needed for mild pain, fever or headache (or Fever >/= 101).   albuterol (  VENTOLIN HFA) 108 (90 Base) MCG/ACT inhaler Inhale 2 puffs into the lungs every 6 (six) hours as needed for wheezing or shortness of breath.   amoxicillin-clavulanate (AUGMENTIN) 875-125 MG tablet Take 1 tablet by mouth 2 (two) times daily. for right lobe pneumonia   apixaban (ELIQUIS) 5 MG TABS tablet Take 1 tablet (5 mg total) by mouth 2 (two) times daily.   Balsam [place]-Castor Oil (VENELEX) OINT Apply 1 application topically in the morning, at noon, and at bedtime.   carboxymethylcellul-glycerin (OPTIVE) 0.5-0.9 % ophthalmic solution Place 1 drop into both eyes 4 (four) times daily. For dry eyes   Ensure (ENSURE) Take 237 mLs by mouth daily at 12 noon. For weight loss   fluticasone (FLONASE) 50 MCG/ACT nasal spray Place 1 spray into both nostrils 2 (two) times daily.   gabapentin (NEURONTIN) 100 MG capsule Take 1 capsule (100 mg  total) by mouth at bedtime.   metoprolol succinate (TOPROL-XL) 25 MG 24 hr tablet Take 0.5 tablets (12.5 mg total) by mouth daily.   NON FORMULARY BiPap at setting of 20/16 while sleeping. Twice A Day   NON FORMULARY Diet: _____ Regular, __x____ NAS, _______Consistent Carbohydrate, _______NPO _____Other   OXYGEN Inhale 2 L into the lungs continuous. May titrate Oxygen from 3 to 4L/mn via [place] for sats below 80 Marga Melnick MD As Needed   pantoprazole (PROTONIX) 40 MG tablet Take 1 tablet (40 mg total) by mouth daily.   polyethylene glycol (MIRALAX / GLYCOLAX) 17 g packet Take 17 g by mouth daily.   sertraline (ZOLOFT) 25 MG tablet Take 25 mg by mouth daily. administer with 50mg  to = 75mg    sertraline (ZOLOFT) 50 MG tablet Take 50 mg by mouth daily. give with 25mg  tab to = 75 mg   sodium chloride (OCEAN) 0.65 % SOLN nasal spray Place 2 sprays into both nostrils every 6 (six) hours as needed (For Dry Stuffy Nose).   tamsulosin (FLOMAX) 0.4 MG CAPS capsule Take 1 capsule (0.4 mg total) by mouth daily after supper.   vitamin C (ASCORBIC ACID) 500 MG tablet Take 1,000 mg by mouth daily.   No facility-administered encounter medications on file as of 08/22/2021.     SIGNIFICANT DIAGNOSTIC EXAMS   PREVIOUS  04-03-21: right hip x-ray: No acute bone abnormality. A follow-up study is recommended if patient's pain persists.  TODAY  08-18-21: chest x-ray:  Right upper lobe airspace disease concerning for pneumonia. Unchanged elevated right hemidiaphragm.  Unchanged cardiomegaly.   LABS REVIEWED PREVIOUS  11-10-20: wbc 7.3; hgb 13.7; hct 44.4; mcv 100.9 plt 156; glucose 75; bun 13; creat 0.73; k+ 3.8; na++ 149; ca 9.2 liver normal albumin 3.1 chol 83; ldl 40; trig 38; hdl 35; tsh 1.020 mag 1.9; vit D 52.71 01-24-21: wbc 8.4; hgb 15.4; hct 48.8; mcv 98.6 plt 174; glucose 91; bun 12; creat 0.64; k+ 4.0; na++ 142; ca 9.4 GFR>60  03-07-21: wbc 9.5; hgb 16.3; hct 51.8; mcv 99.2 plt 179 ;glucose 80; bun  15; creat 0.75; k+ 3.7; na++ 144; ca 9.4; GFR>60 03-09-21: urine culture no growth 04-05-21:hgb 12.4 hct 40.2 04-12-21: hgb 12.7; hct 40.5 06-24-21: wbc 8.7; hgb 14.7; hct 47.8; mcv 103.0 plt 157; glucose 176; bun 13; creat 0.75; k+ 4.2; na++ 138; ca 9.0; GFR>60 d-dimer 0.31; CRP 1.0 08-07-21: wbc 9.7; hgb 13.9; hct 44.5; mcv 102.1 plt 111  08-18-21: wbc 10.1; hgb 13.2; hct 42.2; mcv 101.9 plt 129; glucose 94; bun 13; creat 0.66; k+ 4.1; na++ 141; ca 8.8; GFR >  60; liver normal albumin 3.0  NO NEW LABS.   Review of Systems  Unable to perform ROS: Dementia   Physical Exam Constitutional:      General: He is not in acute distress.    Appearance: He is well-developed. He is not diaphoretic.  Neck:     Thyroid: No thyromegaly.  Cardiovascular:     Rate and Rhythm: Tachycardia present. Rhythm irregular.     Heart sounds: Normal heart sounds.  Pulmonary:     Effort: Pulmonary effort is normal.     Breath sounds: Wheezing and rhonchi present.     Comments: 02 Abdominal:     General: Bowel sounds are normal. There is no distension.     Palpations: Abdomen is soft.     Tenderness: There is no abdominal tenderness.  Musculoskeletal:     Cervical back: Neck supple.     Right lower leg: No edema.     Left lower leg: No edema.  Lymphadenopathy:     Cervical: No cervical adenopathy.  Skin:    General: Skin is warm and dry.  Psychiatric:        Mood and Affect: Mood normal.     ASSESSMENT/ PLAN:  TODAY  HCAP Failure to thrive in adult Chronic diastolic heart failure   Will stop the following medications: vit C; flonase; eliquis; gabapentin; miralax; protonix; zoloft; flomax; toprol xl  Will begin roxanol 5 mg every 4 hours as needed  The focus of his care is comfort.    Synthia Innocent NP [facility]  Contact [phone] Monday through Friday 8am- 5pm  After hours call [phone]

## 2021-08-25 DIAGNOSIS — R627 Adult failure to thrive: Secondary | ICD-10-CM | POA: Insufficient documentation

## 2021-08-25 DIAGNOSIS — I5032 Chronic diastolic (congestive) heart failure: Secondary | ICD-10-CM | POA: Insufficient documentation

## 2021-08-26 IMAGING — DX DG CHEST 1V PORT
1 series · 1 of 1 positions shown · non-contrast
Comparison: 03/08/2020 chest radiograph.

CLINICAL DATA: Dyspnea

EXAM:
PORTABLE CHEST 1 VIEW

[chest ap]
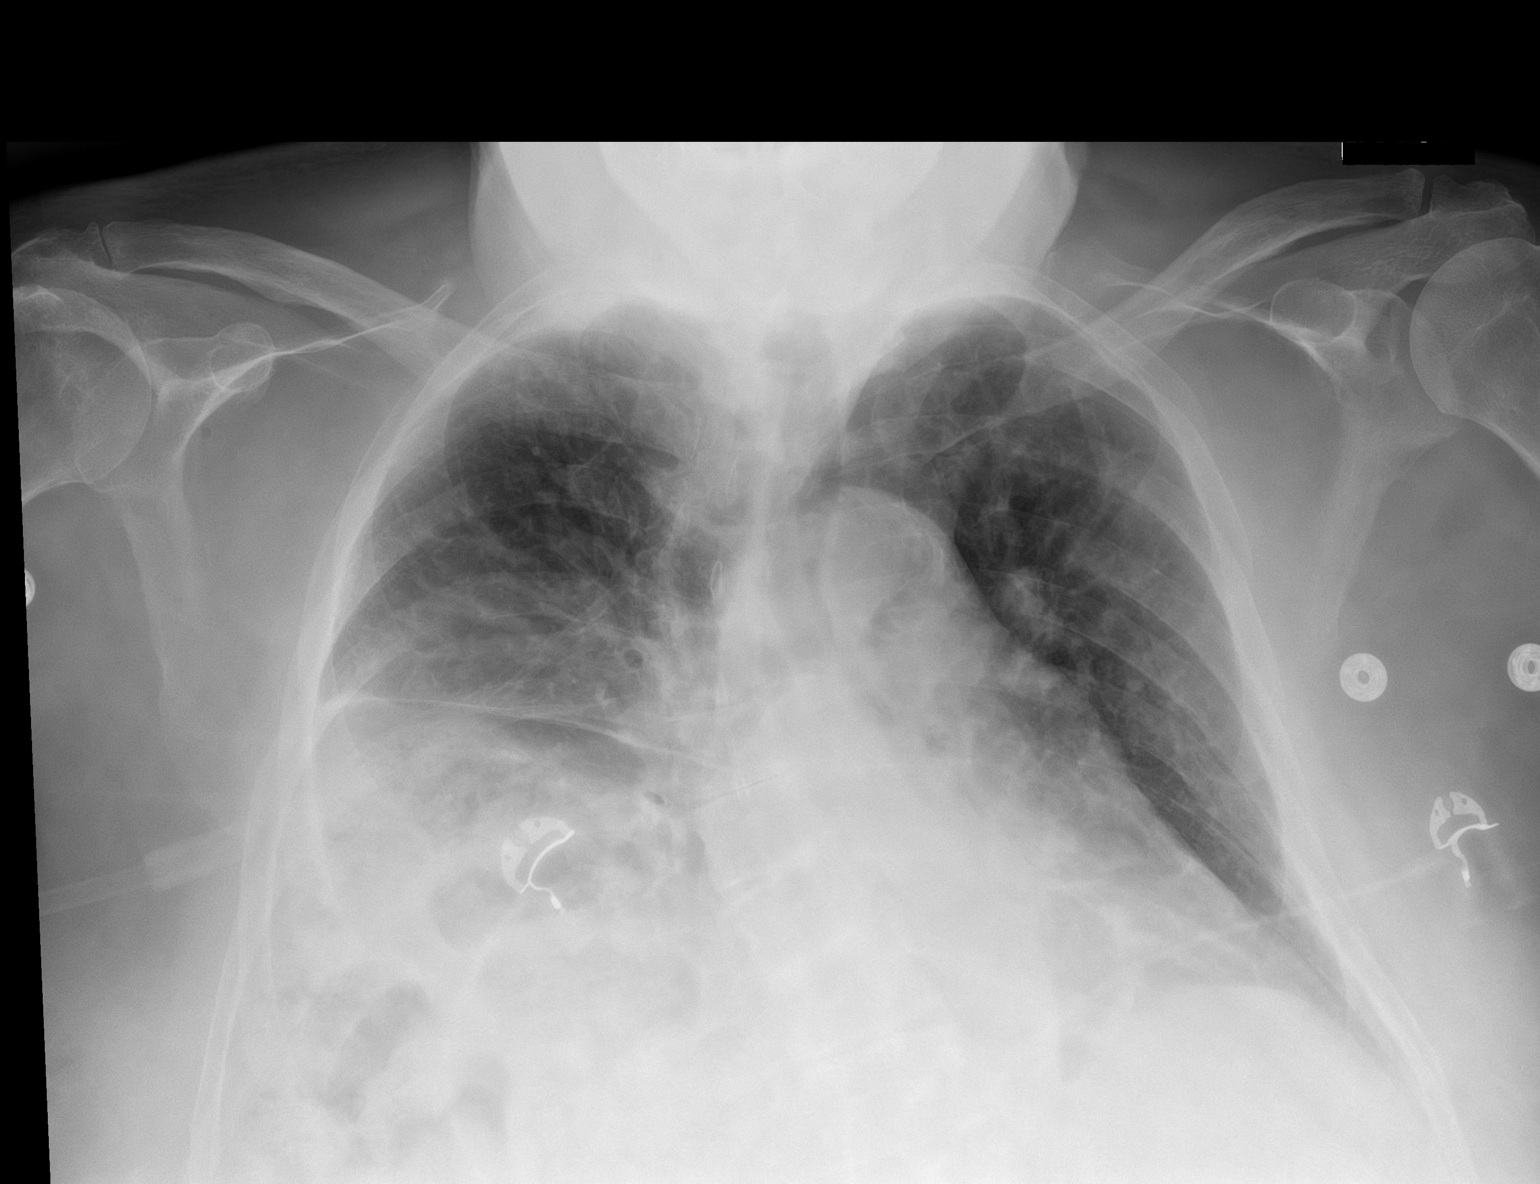

[1 of 1 positions shown; findings below may reference images not displayed]

FINDINGS: Low lung volumes. Stable cardiomediastinal silhouette with
top-normal heart size. No pneumothorax. Stable elevation of the
right hemidiaphragm. Blunting of the right costophrenic angle. No
left pleural effusion. No overt pulmonary edema. Right basilar hazy
opacity.
IMPRESSION: Low lung volumes. Stable elevation of the right hemidiaphragm. Hazy
right basilar lung opacity, which could represent atelectasis,
aspiration and/or pneumonia. New blunting of the right costophrenic
angle, cannot exclude small right pleural effusion.

## 2021-08-31 DEATH — deceased
